# Patient Record
Sex: Male | Born: 1935
Health system: Southern US, Community
[De-identification: ages and names within clinical notes are randomized; demographics above are authoritative.]

## PROBLEM LIST (undated history)

## (undated) DIAGNOSIS — I10 Essential (primary) hypertension: Secondary | ICD-10-CM

## (undated) DIAGNOSIS — N400 Enlarged prostate without lower urinary tract symptoms: Secondary | ICD-10-CM

## (undated) HISTORY — PX: APPENDECTOMY: SHX54

---

## 2018-07-08 DIAGNOSIS — N401 Enlarged prostate with lower urinary tract symptoms: Secondary | ICD-10-CM | POA: Diagnosis not present

## 2018-07-08 DIAGNOSIS — Z0000 Encounter for general adult medical examination without abnormal findings: Secondary | ICD-10-CM | POA: Diagnosis not present

## 2018-07-08 DIAGNOSIS — R7303 Prediabetes: Secondary | ICD-10-CM | POA: Diagnosis not present

## 2018-07-08 DIAGNOSIS — N183 Chronic kidney disease, stage 3 (moderate): Secondary | ICD-10-CM | POA: Diagnosis not present

## 2018-07-08 DIAGNOSIS — I10 Essential (primary) hypertension: Secondary | ICD-10-CM | POA: Diagnosis not present

## 2018-07-08 DIAGNOSIS — I8310 Varicose veins of unspecified lower extremity with inflammation: Secondary | ICD-10-CM | POA: Diagnosis not present

## 2018-07-08 DIAGNOSIS — R42 Dizziness and giddiness: Secondary | ICD-10-CM | POA: Diagnosis not present

## 2018-07-08 DIAGNOSIS — D692 Other nonthrombocytopenic purpura: Secondary | ICD-10-CM | POA: Diagnosis not present

## 2018-07-08 DIAGNOSIS — Z79899 Other long term (current) drug therapy: Secondary | ICD-10-CM | POA: Diagnosis not present

## 2018-07-08 DIAGNOSIS — E785 Hyperlipidemia, unspecified: Secondary | ICD-10-CM | POA: Diagnosis not present

## 2018-07-08 DIAGNOSIS — E559 Vitamin D deficiency, unspecified: Secondary | ICD-10-CM | POA: Diagnosis not present

## 2018-08-06 DIAGNOSIS — R7303 Prediabetes: Secondary | ICD-10-CM | POA: Diagnosis not present

## 2018-08-06 DIAGNOSIS — E785 Hyperlipidemia, unspecified: Secondary | ICD-10-CM | POA: Diagnosis not present

## 2018-08-06 DIAGNOSIS — Z5181 Encounter for therapeutic drug level monitoring: Secondary | ICD-10-CM | POA: Diagnosis not present

## 2019-01-20 DIAGNOSIS — I10 Essential (primary) hypertension: Secondary | ICD-10-CM | POA: Diagnosis not present

## 2019-01-20 DIAGNOSIS — R42 Dizziness and giddiness: Secondary | ICD-10-CM | POA: Diagnosis not present

## 2019-01-20 DIAGNOSIS — M1990 Unspecified osteoarthritis, unspecified site: Secondary | ICD-10-CM | POA: Diagnosis not present

## 2019-08-07 DIAGNOSIS — I10 Essential (primary) hypertension: Secondary | ICD-10-CM | POA: Diagnosis not present

## 2019-08-07 DIAGNOSIS — F330 Major depressive disorder, recurrent, mild: Secondary | ICD-10-CM | POA: Diagnosis not present

## 2019-08-07 DIAGNOSIS — Z79899 Other long term (current) drug therapy: Secondary | ICD-10-CM | POA: Diagnosis not present

## 2019-08-07 DIAGNOSIS — R7303 Prediabetes: Secondary | ICD-10-CM | POA: Diagnosis not present

## 2019-08-07 DIAGNOSIS — N1831 Chronic kidney disease, stage 3a: Secondary | ICD-10-CM | POA: Diagnosis not present

## 2019-08-07 DIAGNOSIS — R42 Dizziness and giddiness: Secondary | ICD-10-CM | POA: Diagnosis not present

## 2019-08-07 DIAGNOSIS — E785 Hyperlipidemia, unspecified: Secondary | ICD-10-CM | POA: Diagnosis not present

## 2019-08-07 DIAGNOSIS — N401 Enlarged prostate with lower urinary tract symptoms: Secondary | ICD-10-CM | POA: Diagnosis not present

## 2019-08-07 DIAGNOSIS — N183 Chronic kidney disease, stage 3 unspecified: Secondary | ICD-10-CM | POA: Diagnosis not present

## 2019-08-07 DIAGNOSIS — Z0000 Encounter for general adult medical examination without abnormal findings: Secondary | ICD-10-CM | POA: Diagnosis not present

## 2019-08-07 DIAGNOSIS — D692 Other nonthrombocytopenic purpura: Secondary | ICD-10-CM | POA: Diagnosis not present

## 2019-08-07 DIAGNOSIS — E559 Vitamin D deficiency, unspecified: Secondary | ICD-10-CM | POA: Diagnosis not present

## 2020-02-23 ENCOUNTER — Emergency Department (HOSPITAL_COMMUNITY): Payer: PPO

## 2020-02-23 ENCOUNTER — Other Ambulatory Visit: Payer: Self-pay

## 2020-02-23 ENCOUNTER — Encounter (HOSPITAL_COMMUNITY): Payer: Self-pay

## 2020-02-23 ENCOUNTER — Inpatient Hospital Stay (HOSPITAL_COMMUNITY)
Admission: EM | Admit: 2020-02-23 | Discharge: 2020-02-27 | DRG: 175 | Disposition: A | Discharge status: Home / Self Care | Payer: PPO | Attending: Internal Medicine | Admitting: Internal Medicine

## 2020-02-23 DIAGNOSIS — N136 Pyonephrosis: Secondary | ICD-10-CM | POA: Diagnosis present

## 2020-02-23 DIAGNOSIS — I2729 Other secondary pulmonary hypertension: Secondary | ICD-10-CM | POA: Diagnosis not present

## 2020-02-23 DIAGNOSIS — I82432 Acute embolism and thrombosis of left popliteal vein: Secondary | ICD-10-CM | POA: Diagnosis not present

## 2020-02-23 DIAGNOSIS — R079 Chest pain, unspecified: Secondary | ICD-10-CM | POA: Diagnosis not present

## 2020-02-23 DIAGNOSIS — K5790 Diverticulosis of intestine, part unspecified, without perforation or abscess without bleeding: Secondary | ICD-10-CM | POA: Diagnosis not present

## 2020-02-23 DIAGNOSIS — R000 Tachycardia, unspecified: Secondary | ICD-10-CM | POA: Diagnosis not present

## 2020-02-23 DIAGNOSIS — J479 Bronchiectasis, uncomplicated: Secondary | ICD-10-CM | POA: Diagnosis not present

## 2020-02-23 DIAGNOSIS — I2609 Other pulmonary embolism with acute cor pulmonale: Secondary | ICD-10-CM | POA: Diagnosis not present

## 2020-02-23 DIAGNOSIS — N1831 Chronic kidney disease, stage 3a: Secondary | ICD-10-CM | POA: Diagnosis not present

## 2020-02-23 DIAGNOSIS — I10 Essential (primary) hypertension: Secondary | ICD-10-CM | POA: Diagnosis not present

## 2020-02-23 DIAGNOSIS — I129 Hypertensive chronic kidney disease with stage 1 through stage 4 chronic kidney disease, or unspecified chronic kidney disease: Secondary | ICD-10-CM | POA: Diagnosis not present

## 2020-02-23 DIAGNOSIS — Z87891 Personal history of nicotine dependence: Secondary | ICD-10-CM | POA: Diagnosis not present

## 2020-02-23 DIAGNOSIS — Z79899 Other long term (current) drug therapy: Secondary | ICD-10-CM | POA: Diagnosis not present

## 2020-02-23 DIAGNOSIS — N390 Urinary tract infection, site not specified: Secondary | ICD-10-CM | POA: Diagnosis present

## 2020-02-23 DIAGNOSIS — N3289 Other specified disorders of bladder: Secondary | ICD-10-CM | POA: Diagnosis not present

## 2020-02-23 DIAGNOSIS — Q631 Lobulated, fused and horseshoe kidney: Secondary | ICD-10-CM

## 2020-02-23 DIAGNOSIS — R309 Painful micturition, unspecified: Secondary | ICD-10-CM | POA: Diagnosis not present

## 2020-02-23 DIAGNOSIS — J90 Pleural effusion, not elsewhere classified: Secondary | ICD-10-CM | POA: Diagnosis not present

## 2020-02-23 DIAGNOSIS — I2602 Saddle embolus of pulmonary artery with acute cor pulmonale: Secondary | ICD-10-CM | POA: Diagnosis not present

## 2020-02-23 DIAGNOSIS — N323 Diverticulum of bladder: Secondary | ICD-10-CM | POA: Diagnosis not present

## 2020-02-23 DIAGNOSIS — I712 Thoracic aortic aneurysm, without rupture, unspecified: Secondary | ICD-10-CM | POA: Diagnosis present

## 2020-02-23 DIAGNOSIS — I2699 Other pulmonary embolism without acute cor pulmonale: Secondary | ICD-10-CM | POA: Diagnosis not present

## 2020-02-23 DIAGNOSIS — N179 Acute kidney failure, unspecified: Secondary | ICD-10-CM | POA: Diagnosis present

## 2020-02-23 DIAGNOSIS — I358 Other nonrheumatic aortic valve disorders: Secondary | ICD-10-CM | POA: Diagnosis present

## 2020-02-23 DIAGNOSIS — Z20822 Contact with and (suspected) exposure to covid-19: Secondary | ICD-10-CM | POA: Diagnosis not present

## 2020-02-23 DIAGNOSIS — N132 Hydronephrosis with renal and ureteral calculous obstruction: Secondary | ICD-10-CM

## 2020-02-23 DIAGNOSIS — R0781 Pleurodynia: Secondary | ICD-10-CM | POA: Diagnosis present

## 2020-02-23 DIAGNOSIS — N289 Disorder of kidney and ureter, unspecified: Secondary | ICD-10-CM

## 2020-02-23 DIAGNOSIS — N400 Enlarged prostate without lower urinary tract symptoms: Secondary | ICD-10-CM | POA: Diagnosis not present

## 2020-02-23 DIAGNOSIS — R0789 Other chest pain: Secondary | ICD-10-CM | POA: Diagnosis not present

## 2020-02-23 DIAGNOSIS — T83511A Infection and inflammatory reaction due to indwelling urethral catheter, initial encounter: Secondary | ICD-10-CM | POA: Diagnosis present

## 2020-02-23 DIAGNOSIS — M7989 Other specified soft tissue disorders: Secondary | ICD-10-CM | POA: Diagnosis not present

## 2020-02-23 DIAGNOSIS — N1330 Unspecified hydronephrosis: Secondary | ICD-10-CM | POA: Diagnosis not present

## 2020-02-23 DIAGNOSIS — N135 Crossing vessel and stricture of ureter without hydronephrosis: Secondary | ICD-10-CM | POA: Diagnosis present

## 2020-02-23 DIAGNOSIS — I071 Rheumatic tricuspid insufficiency: Secondary | ICD-10-CM | POA: Diagnosis present

## 2020-02-23 DIAGNOSIS — Z66 Do not resuscitate: Secondary | ICD-10-CM | POA: Diagnosis not present

## 2020-02-23 DIAGNOSIS — R319 Hematuria, unspecified: Secondary | ICD-10-CM | POA: Diagnosis not present

## 2020-02-23 DIAGNOSIS — R0902 Hypoxemia: Secondary | ICD-10-CM | POA: Diagnosis not present

## 2020-02-23 HISTORY — DX: Benign prostatic hyperplasia without lower urinary tract symptoms: N40.0

## 2020-02-23 HISTORY — DX: Essential (primary) hypertension: I10

## 2020-02-23 LAB — CBC WITH DIFFERENTIAL/PLATELET
Abs Immature Granulocytes: 0.03 10*3/uL (ref 0.00–0.07)
Basophils Absolute: 0 10*3/uL (ref 0.0–0.1)
Basophils Relative: 0 %
Eosinophils Absolute: 0.2 10*3/uL (ref 0.0–0.5)
Eosinophils Relative: 2 %
HCT: 40.1 % (ref 39.0–52.0)
Hemoglobin: 12.1 g/dL — ABNORMAL LOW (ref 13.0–17.0)
Immature Granulocytes: 0 %
Lymphocytes Relative: 11 %
Lymphs Abs: 1.1 10*3/uL (ref 0.7–4.0)
MCH: 27.1 pg (ref 26.0–34.0)
MCHC: 30.2 g/dL (ref 30.0–36.0)
MCV: 89.7 fL (ref 80.0–100.0)
Monocytes Absolute: 0.8 10*3/uL (ref 0.1–1.0)
Monocytes Relative: 8 %
Neutro Abs: 7.8 10*3/uL — ABNORMAL HIGH (ref 1.7–7.7)
Neutrophils Relative %: 79 %
Platelets: 166 10*3/uL (ref 150–400)
RBC: 4.47 MIL/uL (ref 4.22–5.81)
RDW: 14.6 % (ref 11.5–15.5)
WBC: 9.9 10*3/uL (ref 4.0–10.5)
nRBC: 0 % (ref 0.0–0.2)

## 2020-02-23 LAB — HEPATIC FUNCTION PANEL
ALT: 11 U/L (ref 0–44)
AST: 15 U/L (ref 15–41)
Albumin: 2.9 g/dL — ABNORMAL LOW (ref 3.5–5.0)
Alkaline Phosphatase: 53 U/L (ref 38–126)
Bilirubin, Direct: 0.1 mg/dL (ref 0.0–0.2)
Indirect Bilirubin: 0.7 mg/dL (ref 0.3–0.9)
Total Bilirubin: 0.8 mg/dL (ref 0.3–1.2)
Total Protein: 6.7 g/dL (ref 6.5–8.1)

## 2020-02-23 LAB — BASIC METABOLIC PANEL
Anion gap: 11 (ref 5–15)
BUN: 32 mg/dL — ABNORMAL HIGH (ref 8–23)
CO2: 23 mmol/L (ref 22–32)
Calcium: 9.3 mg/dL (ref 8.9–10.3)
Chloride: 104 mmol/L (ref 98–111)
Creatinine, Ser: 1.67 mg/dL — ABNORMAL HIGH (ref 0.61–1.24)
GFR, Estimated: 39 mL/min — ABNORMAL LOW (ref >60)
Glucose, Bld: 172 mg/dL — ABNORMAL HIGH (ref 70–99)
Potassium: 4 mmol/L (ref 3.5–5.1)
Sodium: 138 mmol/L (ref 135–145)

## 2020-02-23 LAB — TROPONIN I (HIGH SENSITIVITY): Troponin I (High Sensitivity): 43 ng/L — ABNORMAL HIGH (ref <18)

## 2020-02-23 LAB — LIPASE, BLOOD: Lipase: 28 U/L (ref 11–51)

## 2020-02-23 MED ORDER — SODIUM CHLORIDE 0.9 % IV BOLUS
1000.0000 mL | Freq: Once | INTRAVENOUS | Status: AC
  Administered 2020-02-24: 1000 mL via INTRAVENOUS

## 2020-02-23 MED ORDER — FENTANYL CITRATE (PF) 100 MCG/2ML IJ SOLN
75.0000 ug | Freq: Once | INTRAMUSCULAR | Status: AC
  Administered 2020-02-24: 75 ug via INTRAVENOUS
  Filled 2020-02-23: qty 2

## 2020-02-23 NOTE — ED Triage Notes (Signed)
Pt arrived via EMS c/o left sided pain and sob that started around 8pm while walking around his home. Pt states the pain is on his left side near his rib-cage. Pt describes the pain as sharp in nature. Pt states he started to feel sob when the pain occurred.

## 2020-02-23 NOTE — ED Provider Notes (Signed)
[facility] EMERGENCY DEPARTMENT Provider Note   CSN: [id number] Arrival date & time: 02/23/20  2228     History Chief Complaint  Patient presents with  . Chest Pain  . Shortness of Breath    Corey Cummings is a 86 y.o. male with a history of hypertension and BPH who presents to the emergency department by EMS from home with a chief complaint of left flank pain.  The patient endorses three episodes of recurrent, nonradiating left flank pain, onset today.  He characterizes the pain as "feeling as if he being stabbed with a knife in my ribs". Pain is currently 10/10. He states that the intensity of pain causes him to feel short of breath, but he was not feeling short of breath prior to the onset of pain or when the pain resolves.  He notes that the pain also increases whenever he moves his left hip.  No other known aggravating or alleviating factors.  He states the pain was present when he 1st awoke this morning and then resolved.  He later noticed the pain while he was sitting on his couch watching TV and most recently when he was sitting at the table tonight having dinner.  He attempted to take Tums and a stool softener earlier in the day for his symptoms without improvement.  He also reports that his left leg has been more swollen today.  He reports a history of swelling of the left leg intermittently for years.  He also endorses dysuria.  He is fully vaccinated against COVID-19.  However, he reports that his wife passed away from complications of COVID-19 on October 19.  He denies any URI symptoms over the last 6 to 8 weeks.  His daughter reports that he has been under an increased amount of stress over the last few weeks due to his wife's death.  He denies fever, chills, cough, palpitations, back pain, nausea, vomiting, diarrhea, hematuria, neck pain, dizziness, lightheadedness, headache, hematochezia, melena, or difficulty urinating.  Last bowel movement was this  morning.  No history of A. fib.  No family or personal history of VTE.  No recent long travel.  No history of kidney stones.  No recent falls or trauma.  Patient is DNR/DNI after discussing code status with him and his daughter at bedside.   The history is provided by medical records and the patient. No language interpreter was used.       Past Medical History:  Diagnosis Date  . BPH (benign prostatic hyperplasia)   . Hypertension     Patient Active Problem List   Diagnosis Date Noted  . Acute pulmonary embolism (HCC) 02/24/2020  . Ureteral obstruction, right 02/24/2020  . UTI (urinary tract infection) 02/24/2020  . Renal insufficiency 02/24/2020  . Thoracic aortic aneurysm without rupture (HCC) 02/24/2020  . Bronchiectasis (HCC) 02/24/2020  . Hydronephrosis with urinary obstruction due to ureteral calculus     History reviewed. No pertinent surgical history.     History reviewed. No pertinent family history.  Social History   Tobacco Use  . Smoking status: Former Smoker    Types: Cigarettes  . Tobacco comment: Quit 40 years ago  Substance Use Topics  . Alcohol use: Not on file  . Drug use: Not on file    Home Medications Prior to Admission medications   Medication Sig Start Date End Date Taking? Authorizing Provider  finasteride (PROSCAR) 5 MG tablet Take 5 mg by mouth daily. 01/12/20  Yes [provider]  hydrochlorothiazide (MICROZIDE) 12.5 MG capsule Take 12.5 mg by mouth daily. 01/04/20  Yes [provider]  olmesartan (BENICAR) 20 MG tablet Take 20 mg by mouth daily. 09/09/19  Yes [provider]    Allergies    Patient has no known allergies.  Review of Systems   Review of Systems  Constitutional: Negative for appetite change, chills, diaphoresis and fever.  HENT: Negative for congestion, sinus pressure, sinus pain and sore throat.   Eyes: Negative for visual disturbance.  Respiratory: Positive for shortness of breath.  Negative for cough and wheezing.   Cardiovascular: Positive for chest pain. Negative for palpitations.  Gastrointestinal: Negative for abdominal pain, diarrhea, nausea and vomiting.  Genitourinary: Positive for dysuria and flank pain. Negative for difficulty urinating and urgency.  Musculoskeletal: Negative for arthralgias, back pain, gait problem, myalgias, neck pain and neck stiffness.  Skin: Negative for rash.  Allergic/Immunologic: Negative for immunocompromised state.  Neurological: Negative for seizures, syncope, weakness, numbness and headaches.  Psychiatric/Behavioral: Negative for confusion.    Physical Exam Updated Vital Signs BP (!) 132/94   Pulse 93   Temp 98.2 F (36.8 C) (Oral)   Resp (!) 24   Ht  (1.753 m)   Wt 74.4 kg   SpO2 95%   BMI 24.22 kg/m   Physical Exam Vitals and nursing note reviewed.  Constitutional:      General: He is not in acute distress.    Appearance: Normal appearance. He is well-developed. He is not ill-appearing, toxic-appearing or diaphoretic.  HENT:     Head: Normocephalic.     Mouth/Throat:     Mouth: Mucous membranes are moist.  Eyes:     Extraocular Movements: Extraocular movements intact.     Conjunctiva/sclera: Conjunctivae normal.  Cardiovascular:     Rate and Rhythm: Normal rate and regular rhythm.     Heart sounds: No murmur heard.   Pulmonary:     Comments: No trauma noted to the chest Wattenbarger.  No reproducible tenderness to palpation to the chest Engram.  No crepitus or step-offs.  Lungs are clear to auscultation bilaterally.  He has mild tachypnea when laying supine that resolves when sitting upright. Chest:     Chest Banes: No tenderness.  Abdominal:     General: There is no distension.     Palpations: Abdomen is soft. There is no mass.     Tenderness: There is no abdominal tenderness. There is left CVA tenderness. There is no right CVA tenderness, guarding or rebound.     Hernia: No hernia is present.     Comments:  Abdomen is soft, nontender, nondistended. No rebound or guarding.  Negative Murphy sign.  No tenderness over McBurney's point.  Musculoskeletal:        General: No swelling, tenderness or deformity.     Cervical back: Normal range of motion and neck supple. No rigidity.     Right lower leg: No edema.     Left lower leg: Edema (non-pitting) present.  Skin:    General: Skin is warm and dry.     Capillary Refill: Capillary refill takes less than 2 seconds.     Coloration: Skin is not jaundiced.     Findings: No bruising, lesion or rash.  Neurological:     General: No focal deficit present.     Mental Status: He is alert.  Psychiatric:        Behavior: Behavior normal.     ED Results / Procedures / Treatments  Labs (all labs ordered are listed, but only abnormal results are displayed) Labs Reviewed  CBC WITH DIFFERENTIAL/PLATELET - Abnormal; Notable for the following components:      Result Value   Hemoglobin 12.1 (*)    Neutro Abs 7.8 (*)    All other components within normal limits  BASIC METABOLIC PANEL - Abnormal; Notable for the following components:   Glucose, Bld 172 (*)    BUN 32 (*)    Creatinine, Ser 1.67 (*)    GFR, Estimated 39 (*)    All other components within normal limits  HEPATIC FUNCTION PANEL - Abnormal; Notable for the following components:   Albumin 2.9 (*)    All other components within normal limits  URINALYSIS, ROUTINE W REFLEX MICROSCOPIC - Abnormal; Notable for the following components:   Color, Urine AMBER (*)    APPearance CLOUDY (*)    Hgb urine dipstick MODERATE (*)    Protein, ur 30 (*)    Leukocytes,Ua LARGE (*)    WBC, UA >50 (*)    Bacteria, UA FEW (*)    All other components within normal limits  D-DIMER, QUANTITATIVE (NOT AT [facility]) - Abnormal; Notable for the following components:   D-Dimer, Quant 8.15 (*)    All other components within normal limits  BUN - Abnormal; Notable for the following components:   BUN 28 (*)    All other  components within normal limits  TROPONIN I (HIGH SENSITIVITY) - Abnormal; Notable for the following components:   Troponin I (High Sensitivity) 43 (*)    All other components within normal limits  TROPONIN I (HIGH SENSITIVITY) - Abnormal; Notable for the following components:   Troponin I (High Sensitivity) 45 (*)    All other components within normal limits  RESPIRATORY PANEL BY RT PCR (FLU A&B, COVID)  URINE CULTURE  LIPASE, BLOOD  HEPARIN LEVEL (UNFRACTIONATED)  SODIUM, URINE, RANDOM  CREATININE, URINE, RANDOM  BASIC METABOLIC PANEL  BRAIN NATRIURETIC PEPTIDE  C-REACTIVE PROTEIN    EKG EKG Interpretation  Date/Time:  Tuesday February 23 2020 23:46:18 EST Ventricular Rate:  103 PR Interval:    QRS Duration: 118 QT Interval:  352 QTC Calculation: 461 R Axis:   54 Text Interpretation: Sinus tachycardia Multiform ventricular premature complexes Nonspecific intraventricular conduction delay Anterior infarct, old Minimal ST depression, lateral leads Confirmed by Zadie Rhine ([id number]) on 02/23/2020 11:50:13 PM   Radiology CT Angio Chest PE W and/or Wo Contrast  Addendum Date: 02/24/2020   ADDENDUM REPORT: 02/24/2020 03:35 ADDENDUM: These results were called by telephone at the time of interpretation on 02/24/2020 at 3:35 am to provider Marly Schuld [facility] , who verbally acknowledged these results. Electronically Signed   By: Helyn Numbers MD   On: 02/24/2020 03:35   Result Date: 02/24/2020 CLINICAL DATA:  Left chest pain, dyspnea EXAM: CT ANGIOGRAPHY CHEST WITH CONTRAST TECHNIQUE: Multidetector CT imaging of the chest was performed using the standard protocol during bolus administration of intravenous contrast. Multiplanar CT image reconstructions and MIPs were obtained to evaluate the vascular anatomy. CONTRAST:  60mL OMNIPAQUE IOHEXOL 350 MG/ML SOLN COMPARISON:  None. FINDINGS: Cardiovascular: There is excellent opacification of the pulmonary arterial tree. There are multiple  branching intraluminal filling defects identified within the segmental pulmonary arteries of the left upper lobe, lingula, left lower lobe, and right lower lobe compatible with acute pulmonary embolism. The embolic burden is moderate. The central pulmonary arteries are of normal caliber. There is relative enlargement of the right ventricle in relation to the  left compatible with CT evidence of right heart strain. There is reflux of contrast into the hepatic venous system in keeping with at least some degree of right heart failure. Mild coronary artery calcification. Global cardiac size within normal limits. No pericardial effusion. The thoracic aorta is dilated in its descending segment at the aortic arch measuring 3.3 cm in greatest dimension. This vessel measures 3.1 cm in dimension at the level of the left atrium. Mild atherosclerotic calcification within the thoracic aorta. Mediastinum/Nodes: There is asymmetric enlargement of the left thyroid lobe with minimal mass effect upon the airway. No underlying nodularity. No pathologic thoracic adenopathy. The esophagus is unremarkable. Lungs/Pleura: There is widespread bronchial Schwartz thickening in keeping with airway inflammation. There is mild associated bronchiectasis within the right upper lobe with peribronchial nodularity and peribronchial thickening. Together, the findings are suspicious for changes of atypical infection including mycobacterium avium intracellulare or fungal infection. Milder changes are noted within the right lower lobe. There is more focal consolidation within the posterior basal left lower lobe and basilar lingula which may reflect hemorrhagic infiltrate in the setting of a pulmonary infarct. Tiny left pleural effusion is present. No pneumothorax. No central obstructing lesion. Upper Abdomen: Cystic lesions are incompletely evaluated at the visualized upper pole of the kidneys bilaterally. No acute abnormality within the abdomen.  Musculoskeletal: No acute bone abnormality. Review of the MIP images confirms the above findings. IMPRESSION: Acute, moderate pulmonary embolism. Enlargement of the right ventricle with inversion of the normal RV/LV ratio in keeping with CT evidence of right heart strain. Reflux of contrast into the hepatic venous system compatible with at least some degree of right heart failure. Bronchial Spagnolo thickening, peribronchial nodularity, bronchiectasis predominantly within the right upper lobe in keeping with changes of atypical infection as can be seen with mycobacterium avium intracellulare. Milder changes noted within the right lower lobe. Diffuse bronchial Popescu thickening in keeping with airway inflammation. Subtle focal infiltrate within the posterior basal left lower lobe and basilar lingula possibly representing developing pulmonary infarct. Trace associated left pleural effusion. Dilation of the thoracic aortic arch measuring 3.3 cm in maximal dimension. Recommend annual imaging followup by CTA or MRA. This recommendation follows 2010 ACCF/AHA/AATS/ACR/ASA/SCA/SCAI/SIR/STS/SVM Guidelines for the Diagnosis and Management of Patients with Thoracic Aortic Disease. Circulation.2010; 121: Z610-R604. Aortic aneurysm NOS (ICD10-I71.9) Attempts are being made at this time to contact the managing physician for direct communication. Aortic Atherosclerosis (ICD10-I70.0). Electronically Signed: By: Helyn Numbers MD On: 02/24/2020 03:08   DG Chest Portable 1 View  Result Date: 02/23/2020 CLINICAL DATA:  Chest pain EXAM: PORTABLE CHEST 1 VIEW COMPARISON:  None. FINDINGS: The heart size and mediastinal contours are within normal limits. Aortic knob calcifications are seen. Both lungs are clear. The visualized skeletal structures are unremarkable. IMPRESSION: No active disease. Electronically Signed   By: Jonna Clark M.D.   On: 02/23/2020 22:54   CT Renal Stone Study  Result Date: 02/24/2020 CLINICAL DATA:  Left rib,  flank pain EXAM: CT ABDOMEN AND PELVIS WITHOUT CONTRAST TECHNIQUE: Multidetector CT imaging of the abdomen and pelvis was performed following the standard protocol without IV contrast. COMPARISON:  None. FINDINGS: Lower chest: Small left pleural effusion with some adjacent ground-glass, favoring mixed passive and dependent atelectasis. Additional scarring and architectural distortion seen the lingula. Cardiac size is top. No pericardial effusion. Aortic leaflet calcifications. Coronary artery atherosclerosis is noted as well. Hepatobiliary: No discernible focal liver lesion within the limitations of this unenhanced CT. Smooth liver surface contour. Normal liver attenuation.  Gallbladder partially decompressed. No pericholecystic fluid or inflammation. No visible calcified gallstone or biliary ductal dilatation. Pancreas: No pancreatic ductal dilatation or surrounding inflammatory changes. Spleen: Normal in size. No concerning splenic lesions. Adrenals/Urinary Tract: Normal adrenal glands. Horseshoe morphology of the kidneys with a thin parenchymal band extending posterior to the IMA. Extreme hydroureteronephrosis of the right renal moiety to the level of a large calculus positioned at the right ureterovesicular junction measuring up to 15 mm in diameter and over 33 mm in length partially protruding through the ureterovesicular orifice. Some associated stranding is noted along the right ureter as well. There is significant cortical thinning on the right as well with numerous cystic appearing foci, some of which could reflect dilated calices though are incompletely assessed on this unenhanced CT. Several of these demonstrate some peripheral thin calcifications which could reflect additional nonobstructing calculi or mural calcifications of cysts. More mild-to-moderate cortical thinning is seen in the left kidney with several fluid attenuation cortical and parapelvic renal cysts. A lobular, distended appearance of the  left renal pelvis is seen with abrupt transition to a more normal caliber distal ureter at the level of the ureteropelvic junction, possibly reflecting a chronic UPJ obstruction though comparison imaging is unavailable. No left urinary tract calculi are seen. Circumferential thickening of the bladder with multiple diverticular outpouchings and indentation of the bladder base by the enlarged prostate. Stomach/Bowel: Distal esophagus, stomach and duodenal sweep are unremarkable. No small bowel Acuna thickening or dilatation. No evidence of obstruction. Appendix is not visualized. No focal inflammation the vicinity of the cecum to suggest an occult appendicitis. No colonic dilatation or Stanco thickening. Vascular/Lymphatic: Atherosclerotic calcifications within the abdominal aorta and branch vessels. No aneurysm or ectasia. No enlarged abdominopelvic lymph nodes. Reproductive: Prostatomegaly with indentation of bladder base as above. Coarse eccentric calcification of the prostate. No other significant or concerning abnormalities of the prostate or seminal vesicles. Other: No abdominopelvic free fluid or free gas. No bowel containing hernias. Fat containing left inguinal hernia. Musculoskeletal: Likely remote anterior compression deformity at L1 with up to 40% height loss. No other vertebral body height loss or compression deformity is seen. No acute fracture or traumatic osseous abnormality. Multilevel degenerative changes are present in the spine, hips and pelvis. No suspicious lytic or blastic lesions. IMPRESSION: 1. Horseshoe morphology of the kidneys with severe hydroureteronephrosis of the right renal moiety to the level of a large calculus positioned at the right ureterovesicular junction measuring up to 15 mm in diameter and over 33 mm in length partially protruding through the ureterovesicular junction. 2. There is extreme cortical thinning on the right as well with numerous cystic appearing foci, some of which  may reflect dilated calices though are incompletely assessed on this unenhanced CT. Several of these demonstrate some peripheral thin calcifications which could reflect additional nonobstructing calculi or mural calcifications of cysts. Could consider further characterization with renal MRI. 3. More mild to moderate cortical thinning of the left kidney with a lobular, distended appearance of the left renal pelvis with abrupt transition to a more normal caliber ureter at the level of the ureteropelvic junction, possibly reflecting a chronic UPJ obstruction though comparison imaging is unavailable. 4. Prostatomegaly with indentation of the bladder base by the enlarged prostate. Circumferential thickening of the bladder with multiple diverticular outpouchings and indentation of the bladder base by the enlarged prostate. While findings could reflect sequela of chronic outlet obstruction given some perivesicular hazy stranding as well as nonspecific stranding along the distended right ureter,  should assess with urinalysis to exclude a superimposed ascending tract infection. 5. Small left pleural effusion with some adjacent ground-glass, favoring mixed passive and dependent atelectasis. 6. Aortic Atherosclerosis (ICD10-I70.0). Electronically Signed   By: Kreg ShropshirePrice  DeHay M.D.   On: 02/24/2020 03:11    Procedures .Critical Care Performed by: Barkley BoardsMcDonald, Keysi Oelkers A, PA-C Authorized by: Barkley BoardsMcDonald, Lakeshia Dohner A, PA-C   Critical care provider statement:    Critical care time (minutes):  55   Critical care time was exclusive of:  Separately billable procedures and treating other patients and teaching time   Critical care was necessary to treat or prevent imminent or life-threatening deterioration of the following conditions:  Cardiac failure and circulatory failure   Critical care was time spent personally by me on the following activities:  Ordering and performing treatments and interventions, ordering and review of laboratory  studies, ordering and review of radiographic studies, pulse oximetry, re-evaluation of patient's condition, review of old charts, obtaining history from patient or surrogate, examination of patient, evaluation of patient's response to treatment, discussions with consultants and development of treatment plan with patient or surrogate   I assumed direction of critical care for this patient from another provider in my specialty: no     (including critical care time)  Medications Ordered in ED Medications  heparin ADULT infusion 100 units/mL (25000 units/2150mL sodium chloride 0.45%) (1,150 Units/hr Intravenous New Bag/Given 02/24/20 0411)  sodium chloride flush (NS) 0.9 % injection 3 mL (has no administration in time range)  0.9 %  sodium chloride infusion (has no administration in time range)  acetaminophen (TYLENOL) tablet 650 mg (has no administration in time range)    Or  acetaminophen (TYLENOL) suppository 650 mg (has no administration in time range)  HYDROcodone-acetaminophen (NORCO/VICODIN) 5-325 MG per tablet 1 tablet (has no administration in time range)  fentaNYL (SUBLIMAZE) injection 50 mcg (has no administration in time range)  polyethylene glycol (MIRALAX / GLYCOLAX) packet 17 g (has no administration in time range)  bisacodyl (DULCOLAX) EC tablet 5 mg (has no administration in time range)  ondansetron (ZOFRAN) tablet 4 mg (has no administration in time range)    Or  ondansetron (ZOFRAN) injection 4 mg (has no administration in time range)  cefTRIAXone (ROCEPHIN) 1 g in sodium chloride 0.9 % 100 mL IVPB (has no administration in time range)  finasteride (PROSCAR) tablet 5 mg (has no administration in time range)  fentaNYL (SUBLIMAZE) injection 75 mcg (75 mcg Intravenous Given 02/24/20 0005)  sodium chloride 0.9 % bolus 1,000 mL (0 mLs Intravenous Stopped 02/24/20 0028)  iohexol (OMNIPAQUE) 350 MG/ML injection 80 mL (80 mLs Intravenous Contrast Given 02/24/20 0203)  fentaNYL  (SUBLIMAZE) injection 75 mcg (75 mcg Intravenous Given 02/24/20 0331)  heparin bolus via infusion 4,000 Units (4,000 Units Intravenous Bolus from Bag 02/24/20 0411)  cefTRIAXone (ROCEPHIN) 1 g in sodium chloride 0.9 % 100 mL IVPB (0 g Intravenous Stopped 02/24/20 0505)    ED Course  I have reviewed the triage vital signs and the nursing notes.  Pertinent labs & imaging results that were available during my care of the patient were reviewed by me and considered in my medical decision making (see chart for details).    MDM Rules/Calculators/A&P                          86 year old male with history of hypertension and BPH who presents the emergency department by EMS with intermittent left flank pain and pain in  his left ribs accompanied by dysuria.  Intensity of the pain causes him to feel short of breath, but otherwise he has not had any dyspnea.  He also notes that his left leg became swollen today.  He is fully vaccinated against COVID-19.  This involves an extensive number of treatment options, and is a complaint that carries with it a high risk of complications and morbidity.  The differential diagnosis includes PE, pulmonary infarct, pyelonephritis, obstructive urolithiasis, musculoskeletal etiology, ACS, gastritis, pancreatitis, tension pneumothorax, or aortic dissection.   Patient seen and independently evaluated by Dr. Bebe Shaggy, attending physician.  Vitals and Exam:    He is hypertensive, mildly tachypneic.  There is mild tachycardia.  No hypoxia.  Afebrile.  He has nonpitting edema of the left lower extremity.  Lungs are clear to auscultation bilaterally.  No trauma noted to the chest Lamp or left flank.  He has left CVA tenderness.   Lab Tests:    I ordered, reviewed, and interpreted labs, which included  urinalysis, D-dimer, CBC, CMP, troponin, BNP that showed   Creatinine of 1.67 and BUN of 32.  BUN to creatinine ratio 19.  No previous creatinine available for comparison,  but limited access to records in the patient's chart indicates that the patient has a history of CKD stage III.  He is a poor historian and cannot recall if he has ever been diagnosed with CKD.  Elevated troponin with flat trend.  This could be secondary to creatinine level.  There is also concerned that since he is having pleuritic chest pain that he could have a PE.  EKG with sinus tachycardia and minimal ST segment depression.  D-dimer is elevated and he will need a CT scan.  UA with leukocyte esterase, RBCs, and hemoglobinuria.  Urine culture sent.   Imaging Studies ordered:    I ordered imaging studies which included  CT stone study and PE study  I independently visualized and interpreted imaging which showed PE with an associated pulmonary infarct and evidence of right heart strain.  Stone study with horseshoe morphology of the kidneys with severe hydroureter nephrosis on the right with a 15 mm in diameter and 33 mm in length obstructed calculus protruding through the ureter vesicular junction.  He also appears to have extreme cortical thinning on the right and numerous cystic-appearing foci that may benefit from further evaluation with renal MRI.  There is a possible chronic UPJ obstruction on the left does not fully evaluated on CT stone study.  There is prostatomegaly with indention of the bladder base by the enlarged prostate.  There is also circumferential thickening of the bladder with diverticular outpouchings indentation of the bladder base by the enlarged prostate that could be sequelae of chronic outlet obstruction versus superimposed a sending tract infection.   Additional history obtained:    Previous records obtained and reviewed  Medicines ordered:    I ordered Rocephin for UTI, Heparin for PE with evidence of acute cor pulmonale, and fentanyl for pain control.   Consultations Obtained:    Spoke with Dr. Ramiro Harvest regarding critical findings on CT PE  study  Spoke with Dr. Elvera Maria, radiology regarding CT stone study given there was concern about partial protrusion through the ureteral vesicular junction given that patient will need to be started on heparin for PE.  He does not see evidence of active bleeding in the abdomen or pelvis or around the ureter.  Spoke with Dr. Antionette Char, hospitalist, who will accept the patient for admission  I consulted urology on behalf of Dr. Antionette Char, hospitalist. Spoke with Dr. Patience Musca, urology fellow regarding concern for obstructive urolithiasis with urinalysis concerning for infection.  We also discussed horseshoe morphology of the kidneys with severe hydroureter nephrosis of the right kidney and 15 mm stone partially protruding through the ureter vesicular junction.  Patient has been started on Rocephin and urine culture sent.  No other recommendations from urology at this time.  Urology will follow the patient as a consult.   Reevaluation:   After the interventions stated above, I reevaluated the patient and found improved  Plan and Disposition:   Patient will be admitted to the hospitalist service for further work-up and evaluation   Critical Interventions:     Heparin initiated for PE with evidence of acute cor pulmonale   Final Clinical Impression(s) / ED Diagnoses Final diagnoses:  Acute pulmonary embolism with acute cor pulmonale, unspecified pulmonary embolism type (HCC)  Pulmonary infarct (HCC)  Hydronephrosis with urinary obstruction due to ureteral calculus    Rx / DC Orders ED Discharge Orders    None       Barkley Boards, PA-C 02/24/20 1610    Zadie Rhine, MD 02/24/20 [phone]

## 2020-02-24 ENCOUNTER — Emergency Department (HOSPITAL_COMMUNITY): Payer: PPO

## 2020-02-24 ENCOUNTER — Encounter (HOSPITAL_COMMUNITY): Payer: Self-pay | Admitting: Family Medicine

## 2020-02-24 ENCOUNTER — Observation Stay (HOSPITAL_COMMUNITY): Payer: PPO

## 2020-02-24 DIAGNOSIS — R309 Painful micturition, unspecified: Secondary | ICD-10-CM | POA: Diagnosis not present

## 2020-02-24 DIAGNOSIS — I712 Thoracic aortic aneurysm, without rupture, unspecified: Secondary | ICD-10-CM | POA: Diagnosis present

## 2020-02-24 DIAGNOSIS — I2699 Other pulmonary embolism without acute cor pulmonale: Secondary | ICD-10-CM | POA: Diagnosis not present

## 2020-02-24 DIAGNOSIS — J479 Bronchiectasis, uncomplicated: Secondary | ICD-10-CM

## 2020-02-24 DIAGNOSIS — N136 Pyonephrosis: Secondary | ICD-10-CM | POA: Diagnosis present

## 2020-02-24 DIAGNOSIS — R319 Hematuria, unspecified: Secondary | ICD-10-CM

## 2020-02-24 DIAGNOSIS — Z79899 Other long term (current) drug therapy: Secondary | ICD-10-CM | POA: Diagnosis not present

## 2020-02-24 DIAGNOSIS — I2602 Saddle embolus of pulmonary artery with acute cor pulmonale: Secondary | ICD-10-CM | POA: Diagnosis not present

## 2020-02-24 DIAGNOSIS — N1831 Chronic kidney disease, stage 3a: Secondary | ICD-10-CM | POA: Diagnosis present

## 2020-02-24 DIAGNOSIS — N390 Urinary tract infection, site not specified: Secondary | ICD-10-CM

## 2020-02-24 DIAGNOSIS — M7989 Other specified soft tissue disorders: Secondary | ICD-10-CM

## 2020-02-24 DIAGNOSIS — Z87891 Personal history of nicotine dependence: Secondary | ICD-10-CM | POA: Diagnosis not present

## 2020-02-24 DIAGNOSIS — Z20822 Contact with and (suspected) exposure to covid-19: Secondary | ICD-10-CM | POA: Diagnosis present

## 2020-02-24 DIAGNOSIS — N400 Enlarged prostate without lower urinary tract symptoms: Secondary | ICD-10-CM | POA: Diagnosis present

## 2020-02-24 DIAGNOSIS — N135 Crossing vessel and stricture of ureter without hydronephrosis: Secondary | ICD-10-CM | POA: Diagnosis not present

## 2020-02-24 DIAGNOSIS — N289 Disorder of kidney and ureter, unspecified: Secondary | ICD-10-CM

## 2020-02-24 DIAGNOSIS — I82432 Acute embolism and thrombosis of left popliteal vein: Secondary | ICD-10-CM | POA: Diagnosis present

## 2020-02-24 DIAGNOSIS — R0781 Pleurodynia: Secondary | ICD-10-CM | POA: Diagnosis present

## 2020-02-24 DIAGNOSIS — I2609 Other pulmonary embolism with acute cor pulmonale: Secondary | ICD-10-CM | POA: Diagnosis present

## 2020-02-24 DIAGNOSIS — N179 Acute kidney failure, unspecified: Secondary | ICD-10-CM | POA: Diagnosis present

## 2020-02-24 DIAGNOSIS — I129 Hypertensive chronic kidney disease with stage 1 through stage 4 chronic kidney disease, or unspecified chronic kidney disease: Secondary | ICD-10-CM | POA: Diagnosis present

## 2020-02-24 DIAGNOSIS — N132 Hydronephrosis with renal and ureteral calculous obstruction: Secondary | ICD-10-CM

## 2020-02-24 DIAGNOSIS — T83511A Infection and inflammatory reaction due to indwelling urethral catheter, initial encounter: Secondary | ICD-10-CM | POA: Diagnosis present

## 2020-02-24 DIAGNOSIS — Z66 Do not resuscitate: Secondary | ICD-10-CM | POA: Diagnosis present

## 2020-02-24 DIAGNOSIS — I2729 Other secondary pulmonary hypertension: Secondary | ICD-10-CM | POA: Diagnosis present

## 2020-02-24 DIAGNOSIS — Q631 Lobulated, fused and horseshoe kidney: Secondary | ICD-10-CM | POA: Diagnosis not present

## 2020-02-24 LAB — URINALYSIS, ROUTINE W REFLEX MICROSCOPIC
Bilirubin Urine: NEGATIVE
Glucose, UA: NEGATIVE mg/dL
Ketones, ur: NEGATIVE mg/dL
Nitrite: NEGATIVE
Protein, ur: 30 mg/dL — AB
Specific Gravity, Urine: 1.013 (ref 1.005–1.030)
WBC, UA: >50 WBC/hpf — ABNORMAL HIGH (ref 0–5)
pH: 6 (ref 5.0–8.0)

## 2020-02-24 LAB — ECHOCARDIOGRAM COMPLETE
Area-P 1/2: 4.09 cm2
Calc EF: 53.1 %
Height: 69 in
S' Lateral: 3.5 cm
Single Plane A2C EF: 54.5 %
Single Plane A4C EF: 54.7 %
Weight: 2624 oz

## 2020-02-24 LAB — BASIC METABOLIC PANEL
Anion gap: 9 (ref 5–15)
BUN: 26 mg/dL — ABNORMAL HIGH (ref 8–23)
CO2: 22 mmol/L (ref 22–32)
Calcium: 9 mg/dL (ref 8.9–10.3)
Chloride: 108 mmol/L (ref 98–111)
Creatinine, Ser: 1.36 mg/dL — ABNORMAL HIGH (ref 0.61–1.24)
GFR, Estimated: 50 mL/min — ABNORMAL LOW (ref >60)
Glucose, Bld: 110 mg/dL — ABNORMAL HIGH (ref 70–99)
Potassium: 4 mmol/L (ref 3.5–5.1)
Sodium: 139 mmol/L (ref 135–145)

## 2020-02-24 LAB — RESPIRATORY PANEL BY RT PCR (FLU A&B, COVID)
Influenza A by PCR: NEGATIVE
Influenza B by PCR: NEGATIVE
SARS Coronavirus 2 by RT PCR: NEGATIVE

## 2020-02-24 LAB — D-DIMER, QUANTITATIVE: D-Dimer, Quant: 8.15 ug/mL-FEU — ABNORMAL HIGH (ref 0.00–0.50)

## 2020-02-24 LAB — HEPARIN LEVEL (UNFRACTIONATED)
Heparin Unfractionated: 0.27 IU/mL — ABNORMAL LOW (ref 0.30–0.70)
Heparin Unfractionated: 0.45 IU/mL (ref 0.30–0.70)

## 2020-02-24 LAB — TROPONIN I (HIGH SENSITIVITY): Troponin I (High Sensitivity): 45 ng/L — ABNORMAL HIGH (ref <18)

## 2020-02-24 LAB — BUN: BUN: 28 mg/dL — ABNORMAL HIGH (ref 8–23)

## 2020-02-24 LAB — BRAIN NATRIURETIC PEPTIDE: B Natriuretic Peptide: 285.5 pg/mL — ABNORMAL HIGH (ref 0.0–100.0)

## 2020-02-24 LAB — C-REACTIVE PROTEIN: CRP: 11 mg/dL — ABNORMAL HIGH (ref <1.0)

## 2020-02-24 MED ORDER — HYDROCODONE-ACETAMINOPHEN 5-325 MG PO TABS: 1.0000 | ORAL_TABLET | ORAL | Status: DC | PRN

## 2020-02-24 MED ORDER — ACETAMINOPHEN 325 MG PO TABS
650.0000 mg | ORAL_TABLET | Freq: Four times a day (QID) | ORAL | Status: DC | PRN
  Administered 2020-02-26 – 2020-02-27 (×2): 650 mg via ORAL
  Filled 2020-02-24 (×2): qty 2

## 2020-02-24 MED ORDER — FINASTERIDE 5 MG PO TABS
5.0000 mg | ORAL_TABLET | Freq: Every day | ORAL | Status: DC
  Administered 2020-02-24 – 2020-02-27 (×4): 5 mg via ORAL
  Filled 2020-02-24 (×4): qty 1

## 2020-02-24 MED ORDER — ONDANSETRON HCL 4 MG PO TABS: 4.0000 mg | ORAL_TABLET | Freq: Four times a day (QID) | ORAL | Status: DC | PRN

## 2020-02-24 MED ORDER — HEPARIN BOLUS VIA INFUSION
4000.0000 [IU] | Freq: Once | INTRAVENOUS | Status: AC
  Administered 2020-02-24: 4000 [IU] via INTRAVENOUS
  Filled 2020-02-24: qty 4000

## 2020-02-24 MED ORDER — FENTANYL CITRATE (PF) 100 MCG/2ML IJ SOLN: 50.0000 ug | INTRAMUSCULAR | Status: DC | PRN

## 2020-02-24 MED ORDER — POLYETHYLENE GLYCOL 3350 17 G PO PACK: 17.0000 g | PACK | Freq: Every day | ORAL | Status: DC | PRN

## 2020-02-24 MED ORDER — SODIUM CHLORIDE 0.9 % IV SOLN
1.0000 g | Freq: Once | INTRAVENOUS | Status: AC
  Administered 2020-02-24: 1 g via INTRAVENOUS
  Filled 2020-02-24: qty 10

## 2020-02-24 MED ORDER — SODIUM CHLORIDE 0.9% FLUSH
3.0000 mL | Freq: Two times a day (BID) | INTRAVENOUS | Status: DC
  Administered 2020-02-24 – 2020-02-26 (×5): 3 mL via INTRAVENOUS

## 2020-02-24 MED ORDER — ACETAMINOPHEN 650 MG RE SUPP: 650.0000 mg | Freq: Four times a day (QID) | RECTAL | Status: DC | PRN

## 2020-02-24 MED ORDER — ONDANSETRON HCL 4 MG/2ML IJ SOLN: 4.0000 mg | Freq: Four times a day (QID) | INTRAMUSCULAR | Status: DC | PRN

## 2020-02-24 MED ORDER — BISACODYL 5 MG PO TBEC: 5.0000 mg | DELAYED_RELEASE_TABLET | Freq: Every day | ORAL | Status: DC | PRN

## 2020-02-24 MED ORDER — SODIUM CHLORIDE 0.9 % IV SOLN
1.0000 g | INTRAVENOUS | Status: DC
  Administered 2020-02-25 – 2020-02-26 (×2): 1 g via INTRAVENOUS
  Filled 2020-02-24 (×2): qty 10

## 2020-02-24 MED ORDER — HYDROCHLOROTHIAZIDE 12.5 MG PO CAPS
12.5000 mg | ORAL_CAPSULE | Freq: Every day | ORAL | Status: DC
  Administered 2020-02-24: 12.5 mg via ORAL
  Filled 2020-02-24: qty 1

## 2020-02-24 MED ORDER — HEPARIN (PORCINE) 25000 UT/250ML-% IV SOLN
1250.0000 [IU]/h | INTRAVENOUS | Status: DC
  Administered 2020-02-24: 1250 [IU]/h via INTRAVENOUS
  Administered 2020-02-24: 1150 [IU]/h via INTRAVENOUS
  Filled 2020-02-24 (×2): qty 250

## 2020-02-24 MED ORDER — FENTANYL CITRATE (PF) 100 MCG/2ML IJ SOLN
75.0000 ug | Freq: Once | INTRAMUSCULAR | Status: AC
  Administered 2020-02-24: 75 ug via INTRAVENOUS
  Filled 2020-02-24: qty 2

## 2020-02-24 MED ORDER — SODIUM CHLORIDE 0.9 % IV SOLN: INTRAVENOUS | Status: AC

## 2020-02-24 MED ORDER — IOHEXOL 350 MG/ML SOLN
80.0000 mL | Freq: Once | INTRAVENOUS | Status: AC | PRN
  Administered 2020-02-24: 80 mL via INTRAVENOUS

## 2020-02-24 MED ORDER — HEPARIN BOLUS VIA INFUSION
1000.0000 [IU] | Freq: Once | INTRAVENOUS | Status: AC
  Administered 2020-02-24: 1000 [IU] via INTRAVENOUS
  Filled 2020-02-24: qty 1000

## 2020-02-24 NOTE — Consult Note (Signed)
Urology Consult   Physician requesting consult: Dr. Ella JubileeArrien, [place] RamMauricio Daniel  Reason for consult: obstructing ureteral stone  History of Present Illness: Corey Cummings is a 86 y.o. with history of BPH, HTN who presented to ED with a day of SOB and left-sided pleuritic pain and was found to have acute PE. Urology was consulted when he was found incidentally on CT imaging to have a large 1.5x3cm right obstructing ureteral stone at the level of the UVJ with severe hydroureteronephrosis of the right ureter and kidney with cortical thinning consistent with chronic obstruction. Large prostate on imaging. He denies any recent fevers, chills, nausea, vomiting. He denies right flank or abdominal pain. He denies hematuria, however does report intermittent burning with urination that has been stable for several months. He denies personal or family history of stones or GU malignancy and denies personal history of UTIs. He takes finasteride for BPH and reports decent urine stream and sensation of complete emptying.    Past Medical History:  Diagnosis Date  . BPH (benign prostatic hyperplasia)   . Hypertension     History reviewed. No pertinent surgical history.  Current Hospital Medications:  Home Meds:  No current facility-administered medications on file prior to encounter.   Current Outpatient Medications on File Prior to Encounter  Medication Sig Dispense Refill  . finasteride (PROSCAR) 5 MG tablet Take 5 mg by mouth daily.    . hydrochlorothiazide (MICROZIDE) 12.5 MG capsule Take 12.5 mg by mouth daily.    Marland Kitchen. olmesartan (BENICAR) 20 MG tablet Take 20 mg by mouth daily.       Scheduled Meds: . finasteride  5 mg Oral Daily  . sodium chloride flush  3 mL Intravenous Q12H   Continuous Infusions: . sodium chloride    . [START ON 02/25/2020] cefTRIAXone (ROCEPHIN)  IV    . heparin 1,150 Units/hr (02/24/20 0411)   PRN Meds:.acetaminophen **OR** acetaminophen, bisacodyl, fentaNYL (SUBLIMAZE)  injection, HYDROcodone-acetaminophen, ondansetron **OR** ondansetron (ZOFRAN) IV, polyethylene glycol  Allergies: No Known Allergies  History reviewed. No pertinent family history.  Social History:  reports that he has quit smoking. His smoking use included cigarettes. He does not have any smokeless tobacco history on file. No history on file for alcohol use and drug use.  ROS: A complete review of systems was performed.  All systems are negative except for pertinent findings as noted.  Physical Exam:  Vital signs in last 24 hours: Temp:  [98.2 F (36.8 C)] 98.2 F (36.8 C) (11/16 2231) Pulse Rate:  [83-105] 85 (11/17 0700) Resp:  [18-37] 37 (11/17 0700) BP: (117-167)/(83-104) 134/91 (11/17 0700) SpO2:  [92 %-97 %] 94 % (11/17 0700) Weight:  [74.4 kg] 74.4 kg (11/16 2231) Constitutional:  Alert and oriented, No acute distress Cardiovascular: tachycardic Respiratory: slightly increased respiratory effort GI: Abdomen is soft, nontender, nondistended, no abdominal masses GU: No CVA tenderness. Voiding spontaneously. Urine clear yellow. Lymphatic: No lymphadenopathy Neurologic: Grossly intact, no focal deficits Psychiatric: Normal mood and affect  Laboratory Data:  Recent Labs    02/23/20 2232  WBC 9.9  HGB 12.1*  HCT 40.1  PLT 166    Recent Labs    02/23/20 2232 02/24/20 0414  NA 138  --   K 4.0  --   CL 104  --   GLUCOSE 172*  --   BUN 32* 28*  CALCIUM 9.3  --   CREATININE 1.67*  --      Results for orders placed or performed during the hospital encounter of  02/23/20 (from the past 24 hour(s))  CBC with Differential     Status: Abnormal   Collection Time: 02/23/20 10:32 PM  Result Value Ref Range   WBC 9.9 4.0 - 10.5 K/uL   RBC 4.47 4.22 - 5.81 MIL/uL   Hemoglobin 12.1 (L) 13.0 - 17.0 g/dL   HCT 16.1 39 - 52 %   MCV 89.7 80.0 - 100.0 fL   MCH 27.1 26.0 - 34.0 pg   MCHC 30.2 30.0 - 36.0 g/dL   RDW 09.6 04.5 - 40.9 %   Platelets 166 150 - 400 K/uL   nRBC  0.0 0.0 - 0.2 %   Neutrophils Relative % 79 %   Neutro Abs 7.8 (H) 1.7 - 7.7 K/uL   Lymphocytes Relative 11 %   Lymphs Abs 1.1 0.7 - 4.0 K/uL   Monocytes Relative 8 %   Monocytes Absolute 0.8 0.1 - 1.0 K/uL   Eosinophils Relative 2 %   Eosinophils Absolute 0.2 0.0 - 0.5 K/uL   Basophils Relative 0 %   Basophils Absolute 0.0 0.0 - 0.1 K/uL   Immature Granulocytes 0 %   Abs Immature Granulocytes 0.03 0.00 - 0.07 K/uL  Basic metabolic panel     Status: Abnormal   Collection Time: 02/23/20 10:32 PM  Result Value Ref Range   Sodium 138 135 - 145 mmol/L   Potassium 4.0 3.5 - 5.1 mmol/L   Chloride 104 98 - 111 mmol/L   CO2 23 22 - 32 mmol/L   Glucose, Bld 172 (H) 70 - 99 mg/dL   BUN 32 (H) 8 - 23 mg/dL   Creatinine, Ser 8.11 (H) 0.61 - 1.24 mg/dL   Calcium 9.3 8.9 - 91.4 mg/dL   GFR, Estimated 39 (L) >60 mL/min   Anion gap 11 5 - 15  Hepatic function panel     Status: Abnormal   Collection Time: 02/23/20 10:32 PM  Result Value Ref Range   Total Protein 6.7 6.5 - 8.1 g/dL   Albumin 2.9 (L) 3.5 - 5.0 g/dL   AST 15 15 - 41 U/L   ALT 11 0 - 44 U/L   Alkaline Phosphatase 53 38 - 126 U/L   Total Bilirubin 0.8 0.3 - 1.2 mg/dL   Bilirubin, Direct 0.1 0.0 - 0.2 mg/dL   Indirect Bilirubin 0.7 0.3 - 0.9 mg/dL  Lipase, blood     Status: None   Collection Time: 02/23/20 10:32 PM  Result Value Ref Range   Lipase 28 11 - 51 U/L  Troponin I (High Sensitivity)     Status: Abnormal   Collection Time: 02/23/20 10:32 PM  Result Value Ref Range   Troponin I (High Sensitivity) 43 (H) <18 ng/L  D-dimer, quantitative (not at [facility])     Status: Abnormal   Collection Time: 02/23/20 11:58 PM  Result Value Ref Range   D-Dimer, Quant 8.15 (H) 0.00 - 0.50 ug/mL-FEU  Troponin I (High Sensitivity)     Status: Abnormal   Collection Time: 02/24/20 12:32 AM  Result Value Ref Range   Troponin I (High Sensitivity) 45 (H) <18 ng/L  Respiratory Panel by RT PCR (Flu A&B, Covid) - Nasopharyngeal Swab     Status:  None   Collection Time: 02/24/20 12:36 AM   Specimen: Nasopharyngeal Swab  Result Value Ref Range   SARS Coronavirus 2 by RT PCR NEGATIVE NEGATIVE   Influenza A by PCR NEGATIVE NEGATIVE   Influenza B by PCR NEGATIVE NEGATIVE  Urinalysis, Routine w reflex microscopic  Status: Abnormal   Collection Time: 02/24/20  3:28 AM  Result Value Ref Range   Color, Urine AMBER (A) YELLOW   APPearance CLOUDY (A) CLEAR   Specific Gravity, Urine 1.013 1.005 - 1.030   pH 6.0 5.0 - 8.0   Glucose, UA NEGATIVE NEGATIVE mg/dL   Hgb urine dipstick MODERATE (A) NEGATIVE   Bilirubin Urine NEGATIVE NEGATIVE   Ketones, ur NEGATIVE NEGATIVE mg/dL   Protein, ur 30 (A) NEGATIVE mg/dL   Nitrite NEGATIVE NEGATIVE   Leukocytes,Ua LARGE (A) NEGATIVE   RBC / HPF 11-20 0 - 5 RBC/hpf   WBC, UA >50 (H) 0 - 5 WBC/hpf   Bacteria, UA FEW (A) NONE SEEN  BUN     Status: Abnormal   Collection Time: 02/24/20  4:14 AM  Result Value Ref Range   BUN 28 (H) 8 - 23 mg/dL   Recent Results (from the past 240 hour(s))  Respiratory Panel by RT PCR (Flu A&B, Covid) - Nasopharyngeal Swab     Status: None   Collection Time: 02/24/20 12:36 AM   Specimen: Nasopharyngeal Swab  Result Value Ref Range Status   SARS Coronavirus 2 by RT PCR NEGATIVE NEGATIVE Final    Comment: (NOTE) SARS-CoV-2 target nucleic acids are NOT DETECTED.  The SARS-CoV-2 RNA is generally detectable in upper respiratoy specimens during the acute phase of infection. The lowest concentration of SARS-CoV-2 viral copies this assay can detect is 131 copies/mL. A negative result does not preclude SARS-Cov-2 infection and should not be used as the sole basis for treatment or other patient management decisions. A negative result may occur with  improper specimen collection/handling, submission of specimen other than nasopharyngeal swab, presence of viral mutation(s) within the areas targeted by this assay, and inadequate number of viral copies (<131 copies/mL).  A negative result must be combined with clinical observations, patient history, and epidemiological information. The expected result is Negative.  Fact Sheet for Patients:  https://www.moore.com/  Fact Sheet for Healthcare Providers:  https://www.young.biz/  This test is no t yet approved or cleared by the [place] FDA and  has been authorized for detection and/or diagnosis of SARS-CoV-2 by FDA under an Emergency Use Authorization (EUA). This EUA will remain  in effect (meaning this test can be used) for the duration of the COVID-19 declaration under Section 564(b)(1) of the Act, 21 U.S.C. section 360bbb-3(b)(1), unless the authorization is terminated or revoked sooner.     Influenza A by PCR NEGATIVE NEGATIVE Final   Influenza B by PCR NEGATIVE NEGATIVE Final    Comment: (NOTE) The Xpert Xpress SARS-CoV-2/FLU/RSV assay is intended as an aid in  the diagnosis of influenza from Nasopharyngeal swab specimens and  should not be used as a sole basis for treatment. Nasal washings and  aspirates are unacceptable for Xpert Xpress SARS-CoV-2/FLU/RSV  testing.  Fact Sheet for Patients: https://www.moore.com/  Fact Sheet for Healthcare Providers: https://www.young.biz/  This test is not yet approved or cleared by the [place] FDA and  has been authorized for detection and/or diagnosis of SARS-CoV-2 by  FDA under an Emergency Use Authorization (EUA). This EUA will remain  in effect (meaning this test can be used) for the duration of the  Covid-19 declaration under Section 564(b)(1) of the Act, 21  U.S.C. section 360bbb-3(b)(1), unless the authorization is  terminated or revoked. Performed at [facility], [address] [place], [place] [address]     Renal Function: Recent Labs    02/23/20 2232  CREATININE  1.67*   Estimated Creatinine Clearance: 31.2 mL/min (A) (by C-G formula based  on SCr of 1.67 mg/dL (H)).  Radiologic Imaging: CT Angio Chest PE W and/or Wo Contrast  Addendum Date: 02/24/2020   ADDENDUM REPORT: 02/24/2020 03:35 ADDENDUM: These results were called by telephone at the time of interpretation on 02/24/2020 at 3:35 am to provider [facility] , who verbally acknowledged these results. Electronically Signed   By: Helyn Numbers MD   On: 02/24/2020 03:35   Result Date: 02/24/2020 CLINICAL DATA:  Left chest pain, dyspnea EXAM: CT ANGIOGRAPHY CHEST WITH CONTRAST TECHNIQUE: Multidetector CT imaging of the chest was performed using the standard protocol during bolus administration of intravenous contrast. Multiplanar CT image reconstructions and MIPs were obtained to evaluate the vascular anatomy. CONTRAST:  50mL OMNIPAQUE IOHEXOL 350 MG/ML SOLN COMPARISON:  None. FINDINGS: Cardiovascular: There is excellent opacification of the pulmonary arterial tree. There are multiple branching intraluminal filling defects identified within the segmental pulmonary arteries of the left upper lobe, lingula, left lower lobe, and right lower lobe compatible with acute pulmonary embolism. The embolic burden is moderate. The central pulmonary arteries are of normal caliber. There is relative enlargement of the right ventricle in relation to the left compatible with CT evidence of right heart strain. There is reflux of contrast into the hepatic venous system in keeping with at least some degree of right heart failure. Mild coronary artery calcification. Global cardiac size within normal limits. No pericardial effusion. The thoracic aorta is dilated in its descending segment at the aortic arch measuring 3.3 cm in greatest dimension. This vessel measures 3.1 cm in dimension at the level of the left atrium. Mild atherosclerotic calcification within the thoracic aorta. Mediastinum/Nodes: There is asymmetric enlargement of the left thyroid lobe with minimal mass effect upon the airway. No underlying  nodularity. No pathologic thoracic adenopathy. The esophagus is unremarkable. Lungs/Pleura: There is widespread bronchial Stipes thickening in keeping with airway inflammation. There is mild associated bronchiectasis within the right upper lobe with peribronchial nodularity and peribronchial thickening. Together, the findings are suspicious for changes of atypical infection including mycobacterium avium intracellulare or fungal infection. Milder changes are noted within the right lower lobe. There is more focal consolidation within the posterior basal left lower lobe and basilar lingula which may reflect hemorrhagic infiltrate in the setting of a pulmonary infarct. Tiny left pleural effusion is present. No pneumothorax. No central obstructing lesion. Upper Abdomen: Cystic lesions are incompletely evaluated at the visualized upper pole of the kidneys bilaterally. No acute abnormality within the abdomen. Musculoskeletal: No acute bone abnormality. Review of the MIP images confirms the above findings. IMPRESSION: Acute, moderate pulmonary embolism. Enlargement of the right ventricle with inversion of the normal RV/LV ratio in keeping with CT evidence of right heart strain. Reflux of contrast into the hepatic venous system compatible with at least some degree of right heart failure. Bronchial [place] thickening, peribronchial nodularity, bronchiectasis predominantly within the right upper lobe in keeping with changes of atypical infection as can be seen with mycobacterium avium intracellulare. Milder changes noted within the right lower lobe. Diffuse bronchial Lenger thickening in keeping with airway inflammation. Subtle focal infiltrate within the posterior basal left lower lobe and basilar lingula possibly representing developing pulmonary infarct. Trace associated left pleural effusion. Dilation of the thoracic aortic arch measuring 3.3 cm in maximal dimension. Recommend annual imaging followup by CTA or MRA. This  recommendation follows 2010 ACCF/AHA/AATS/ACR/ASA/SCA/SCAI/SIR/STS/SVM Guidelines for the Diagnosis and Management of Patients with Thoracic Aortic Disease.  Circulation.2010; 121: Z610-R604. Aortic aneurysm NOS (ICD10-I71.9) Attempts are being made at this time to contact the managing physician for direct communication. Aortic Atherosclerosis (ICD10-I70.0). Electronically Signed: By: Helyn Numbers MD On: 02/24/2020 03:08   DG Chest Portable 1 View  Result Date: 02/23/2020 CLINICAL DATA:  Chest pain EXAM: PORTABLE CHEST 1 VIEW COMPARISON:  None. FINDINGS: The heart size and mediastinal contours are within normal limits. Aortic knob calcifications are seen. Both lungs are clear. The visualized skeletal structures are unremarkable. IMPRESSION: No active disease. Electronically Signed   By: Jonna Clark M.D.   On: 02/23/2020 22:54   CT Renal Stone Study  Result Date: 02/24/2020 CLINICAL DATA:  Left rib, flank pain EXAM: CT ABDOMEN AND PELVIS WITHOUT CONTRAST TECHNIQUE: Multidetector CT imaging of the abdomen and pelvis was performed following the standard protocol without IV contrast. COMPARISON:  None. FINDINGS: Lower chest: Small left pleural effusion with some adjacent ground-glass, favoring mixed passive and dependent atelectasis. Additional scarring and architectural distortion seen the lingula. Cardiac size is top. No pericardial effusion. Aortic leaflet calcifications. Coronary artery atherosclerosis is noted as well. Hepatobiliary: No discernible focal liver lesion within the limitations of this unenhanced CT. Smooth liver surface contour. Normal liver attenuation. Gallbladder partially decompressed. No pericholecystic fluid or inflammation. No visible calcified gallstone or biliary ductal dilatation. Pancreas: No pancreatic ductal dilatation or surrounding inflammatory changes. Spleen: Normal in size. No concerning splenic lesions. Adrenals/Urinary Tract: Normal adrenal glands. Horseshoe morphology  of the kidneys with a thin parenchymal band extending posterior to the IMA. Extreme hydroureteronephrosis of the right renal moiety to the level of a large calculus positioned at the right ureterovesicular junction measuring up to 15 mm in diameter and over 33 mm in length partially protruding through the ureterovesicular orifice. Some associated stranding is noted along the right ureter as well. There is significant cortical thinning on the right as well with numerous cystic appearing foci, some of which could reflect dilated calices though are incompletely assessed on this unenhanced CT. Several of these demonstrate some peripheral thin calcifications which could reflect additional nonobstructing calculi or mural calcifications of cysts. More mild-to-moderate cortical thinning is seen in the left kidney with several fluid attenuation cortical and parapelvic renal cysts. A lobular, distended appearance of the left renal pelvis is seen with abrupt transition to a more normal caliber distal ureter at the level of the ureteropelvic junction, possibly reflecting a chronic UPJ obstruction though comparison imaging is unavailable. No left urinary tract calculi are seen. Circumferential thickening of the bladder with multiple diverticular outpouchings and indentation of the bladder base by the enlarged prostate. Stomach/Bowel: Distal esophagus, stomach and duodenal sweep are unremarkable. No small bowel Mausolf thickening or dilatation. No evidence of obstruction. Appendix is not visualized. No focal inflammation the vicinity of the cecum to suggest an occult appendicitis. No colonic dilatation or Rheaume thickening. Vascular/Lymphatic: Atherosclerotic calcifications within the abdominal aorta and branch vessels. No aneurysm or ectasia. No enlarged abdominopelvic lymph nodes. Reproductive: Prostatomegaly with indentation of bladder base as above. Coarse eccentric calcification of the prostate. No other significant or concerning  abnormalities of the prostate or seminal vesicles. Other: No abdominopelvic free fluid or free gas. No bowel containing hernias. Fat containing left inguinal hernia. Musculoskeletal: Likely remote anterior compression deformity at L1 with up to 40% height loss. No other vertebral body height loss or compression deformity is seen. No acute fracture or traumatic osseous abnormality. Multilevel degenerative changes are present in the spine, hips and pelvis. No suspicious lytic  or blastic lesions. IMPRESSION: 1. Horseshoe morphology of the kidneys with severe hydroureteronephrosis of the right renal moiety to the level of a large calculus positioned at the right ureterovesicular junction measuring up to 15 mm in diameter and over 33 mm in length partially protruding through the ureterovesicular junction. 2. There is extreme cortical thinning on the right as well with numerous cystic appearing foci, some of which may reflect dilated calices though are incompletely assessed on this unenhanced CT. Several of these demonstrate some peripheral thin calcifications which could reflect additional nonobstructing calculi or mural calcifications of cysts. Could consider further characterization with renal MRI. 3. More mild to moderate cortical thinning of the left kidney with a lobular, distended appearance of the left renal pelvis with abrupt transition to a more normal caliber ureter at the level of the ureteropelvic junction, possibly reflecting a chronic UPJ obstruction though comparison imaging is unavailable. 4. Prostatomegaly with indentation of the bladder base by the enlarged prostate. Circumferential thickening of the bladder with multiple diverticular outpouchings and indentation of the bladder base by the enlarged prostate. While findings could reflect sequela of chronic outlet obstruction given some perivesicular hazy stranding as well as nonspecific stranding along the distended right ureter, should assess with  urinalysis to exclude a superimposed ascending tract infection. 5. Small left pleural effusion with some adjacent ground-glass, favoring mixed passive and dependent atelectasis. 6. Aortic Atherosclerosis (ICD10-I70.0). Electronically Signed   By: Kreg Shropshire M.D.   On: 02/24/2020 03:11    I independently reviewed the above imaging studies.  Impression/Recommendation 86yo M admitted for treatment of acute PE found incidentally on imaging to have large 3cm right obstructing UVJ stone with associated severe hydroureteronephrosis with cortical thinning consistent with chronic obstruction. He is HDS, afebrile, no leukocytosis, Cr elevated to 1.6 with unknown baseline and UA with leukocytes and bacteria.   - Given no acute signs of systemic infection and imaging findings of cortical thinning and lack of right-sided pain, obstruction is likely chronic and no acute urologic intervention is indicated at this time. - would treat empirically for UTI with abx for complicated UTI (10-14d) in the setting of concerning UA and intermittent burning with urination that patient reports - follow up UCx results and narrow therapy as appropriate - development of hematuria is expected in the setting of new blood thinner and obstructing stone. As long as patient is emptying bladder and Hgb is stable, no urologic intervention will be recommended. - if patient becomes hemodynamically unstable, there is concern for worsening infection of urinary source, then intervention may be recommended for urinary decompression on the right side. Ureteral stenting would likely be very difficult in the setting of enlarged prostate and large distal stone, however percutaneous decompression with nephrostomy tube would not be ideal in the setting of anticoagulation. - will plan for outpatient urologic follow up at this time for discussion regarding stone management - please call urology with any questions or concerns.   Corey Cummings 02/24/2020, 8:00 AM

## 2020-02-24 NOTE — ED Notes (Signed)
Urine culture sent down with u/a 

## 2020-02-24 NOTE — Progress Notes (Signed)
ANTICOAGULATION CONSULT NOTE - Follow Up Consult  Pharmacy Consult for heparin Indication: pulmonary embolus  Patient Measurements: Height: 5\' 9"  (175.3 cm) Weight: 67.6 kg (149 lb) IBW/kg (Calculated) : 70.7  Vital Signs: Temp: 98.6 F (37 C) (11/17 1944) Temp Source: Oral (11/17 1944) BP: 136/84 (11/17 2021) Pulse Rate: 87 (11/17 1944)  Labs: Recent Labs    02/23/20 2232 02/24/20 0032 02/24/20 1223 02/24/20 2145  HGB 12.1*  --   --   --   HCT 40.1  --   --   --   PLT 166  --   --   --   HEPARINUNFRC  --   --  0.27* 0.45  CREATININE 1.67*  --  1.36*  --   TROPONINIHS 43* 45*  --   --     Estimated Creatinine Clearance: 36.6 mL/min (A) (by C-G formula based on SCr of 1.36 mg/dL (H)).   Medical History: Past Medical History:  Diagnosis Date  . BPH (benign prostatic hyperplasia)   . Hypertension     Assessment: 86yo male c/o left-sided pain and SOB, CT reveals PE w/ RHS. On IV Heparin.   Initial HL is slightly subtherapeutic at 0.27 on 1150 units/hr. CBC noted.   11/17 PM update:  Heparin level at goal x 1   Goal of Therapy:  Heparin level 0.3-0.7 units/ml Monitor platelets by anticoagulation protocol: Yes   Plan:  -Cont heparin at 1250 units/hr -Confirmatory heparin level with AM labs  12/17, PharmD, BCPS Clinical Pharmacist Phone: [phone]

## 2020-02-24 NOTE — ED Notes (Signed)
Patient transported to CT 

## 2020-02-24 NOTE — Progress Notes (Signed)
Lower extremity venous has been completed.   Preliminary results in CV Proc.   Blanch Media 02/24/2020 10:47 AM

## 2020-02-24 NOTE — Progress Notes (Signed)
  Echocardiogram 2D Echocardiogram has been performed.  Corey Cummings 02/24/2020, 11:16 AM

## 2020-02-24 NOTE — H&P (Signed)
History and Physical    Asael Pann YQM:[id number] DOB: 09/27/1930 DOA: 02/23/2020  PCP: Julian Reil, PA   Patient coming from: Home   Chief Complaint: Pleuritic pain, SOB   HPI: Edu On is a 86 y.o. male with medical history significant for BPH and hypertension, now presenting to emergency department for evaluation of pleuritic pain and shortness of breath.  Patient reports that he received his second dose of the Pfizer Covid vaccine 1 week ago, noticed some mild left lower extremity swelling, but had otherwise been in his usual state until yesterday morning when he experienced some mild left-sided pleuritic pain.  That pain subsided for a while until last night when it returned and was more intense.  He also developed associated shortness of breath last night.  He has not been coughing and denies any recent fevers or chills.  Denies any recent prolonged immobilization, surgery, or family history of DVT or PE.  He has also noted some burning with urination that seems to be intermittent.  ED Course: Upon arrival to the ED, patient is found to be afebrile, saturating in the low 90s on room air, mildly tachypneic and mildly tachycardic with stable blood pressure.  EKG features a sinus tachycardia with rate 102.  Troponin is elevated to 43 and then 45 2 hours later.  Chest x-ray was negative for acute cardiopulmonary disease.  Chemistry panel notable for creatinine 1.67 with no prior labs available for comparison.  CBC unremarkable.  COVID-19 PCR is negative.  D-dimer is elevated to 8.15.  CTA chest is concerning for acute pulmonary embolism with heart strain.  Also noted on CTA is thoracic aortic dilation and bronchiectasis.  Urine was sent for culture and the patient was treated with IV heparin, a liter of saline, fentanyl, and Rocephin.  Urology was consulted by the ED physician.  Hospitalists were asked to admit.  Review of Systems:  All other systems reviewed and apart from HPI, are  negative.  Past Medical History:  Diagnosis Date  . BPH (benign prostatic hyperplasia)   . Hypertension     History reviewed. No pertinent surgical history.  Social History:   reports that he has quit smoking. His smoking use included cigarettes. He does not have any smokeless tobacco history on file. No history on file for alcohol use and drug use.  Not on File  History reviewed. No pertinent family history.   Prior to Admission medications   Not on File    Physical Exam: Vitals:   02/24/20 0145 02/24/20 0305 02/24/20 0330 02/24/20 0415  BP: (!) 147/83 117/87 (!) 162/104 (!) 147/93  Pulse: 84 93 (!) 102 90  Resp: (!) 26 (!) 22 (!) 23 18  Temp:      TempSrc:      SpO2: 94% 92% 94% 93%  Weight:      Height:        Constitutional: NAD, calm  Eyes: PERTLA, lids and conjunctivae normal ENMT: Mucous membranes are moist. Posterior pharynx clear of any exudate or lesions.   Neck: normal, supple, no masses, no thyromegaly Respiratory:  no wheezing, no crackles. No accessory muscle use.  Cardiovascular: S1 & S2 heard, regular rate and rhythm. No extremity edema.   Abdomen: No distension, no tenderness, soft. Bowel sounds active.  Musculoskeletal: no clubbing / cyanosis. No joint deformity upper and lower extremities.   Skin: no significant rashes, lesions, ulcers. Warm, dry, well-perfused. Neurologic: CN 2-12 grossly intact. Sensation intact. Moving all extremities.  Psychiatric: Alert  and oriented to person, place, and situation. Very pleasant and cooperative.    Labs and Imaging on Admission: I have personally reviewed following labs and imaging studies  CBC: Recent Labs  Lab 02/23/20 2232  WBC 9.9  NEUTROABS 7.8*  HGB 12.1*  HCT 40.1  MCV 89.7  PLT 166   Basic Metabolic Panel: Recent Labs  Lab 02/23/20 2232  NA 138  K 4.0  CL 104  CO2 23  GLUCOSE 172*  BUN 32*  CREATININE 1.67*  CALCIUM 9.3   GFR: Estimated Creatinine Clearance: 31.2 mL/min (A) (by  C-G formula based on SCr of 1.67 mg/dL (H)). Liver Function Tests: Recent Labs  Lab 02/23/20 2232  AST 15  ALT 11  ALKPHOS 53  BILITOT 0.8  PROT 6.7  ALBUMIN 2.9*   Recent Labs  Lab 02/23/20 2232  LIPASE 28   No results for input(s): AMMONIA in the last 168 hours. Coagulation Profile: No results for input(s): INR, PROTIME in the last 168 hours. Cardiac Enzymes: No results for input(s): CKTOTAL, CKMB, CKMBINDEX, TROPONINI in the last 168 hours. BNP (last 3 results) No results for input(s): PROBNP in the last 8760 hours. HbA1C: No results for input(s): HGBA1C in the last 72 hours. CBG: No results for input(s): GLUCAP in the last 168 hours. Lipid Profile: No results for input(s): CHOL, HDL, LDLCALC, TRIG, CHOLHDL, LDLDIRECT in the last 72 hours. Thyroid Function Tests: No results for input(s): TSH, T4TOTAL, FREET4, T3FREE, THYROIDAB in the last 72 hours. Anemia Panel: No results for input(s): VITAMINB12, FOLATE, FERRITIN, TIBC, IRON, RETICCTPCT in the last 72 hours. Urine analysis:    Component Value Date/Time   COLORURINE AMBER (A) 02/24/2020 0328   APPEARANCEUR CLOUDY (A) 02/24/2020 0328   LABSPEC 1.013 02/24/2020 0328   PHURINE 6.0 02/24/2020 0328   GLUCOSEU NEGATIVE 02/24/2020 0328   HGBUR MODERATE (A) 02/24/2020 0328   BILIRUBINUR NEGATIVE 02/24/2020 0328   KETONESUR NEGATIVE 02/24/2020 0328   PROTEINUR 30 (A) 02/24/2020 0328   NITRITE NEGATIVE 02/24/2020 0328   LEUKOCYTESUR LARGE (A) 02/24/2020 0328   Sepsis Labs: (procalcitonin:4,lacticidven:4) ) Recent Results (from the past 240 hour(s))  Respiratory Panel by RT PCR (Flu A&B, Covid) - Nasopharyngeal Swab     Status: None   Collection Time: 02/24/20 12:36 AM   Specimen: Nasopharyngeal Swab  Result Value Ref Range Status   SARS Coronavirus 2 by RT PCR NEGATIVE NEGATIVE Final    Comment: (NOTE) SARS-CoV-2 target nucleic acids are NOT DETECTED.  The SARS-CoV-2 RNA is generally detectable in  upper respiratoy specimens during the acute phase of infection. The lowest concentration of SARS-CoV-2 viral copies this assay can detect is 131 copies/mL. A negative result does not preclude SARS-Cov-2 infection and should not be used as the sole basis for treatment or other patient management decisions. A negative result may occur with  improper specimen collection/handling, submission of specimen other than nasopharyngeal swab, presence of viral mutation(s) within the areas targeted by this assay, and inadequate number of viral copies (<131 copies/mL). A negative result must be combined with clinical observations, patient history, and epidemiological information. The expected result is Negative.  Fact Sheet for Patients:  https://www.moore.com/  Fact Sheet for Healthcare Providers:  https://www.young.biz/  This test is no t yet approved or cleared by the [place] FDA and  has been authorized for detection and/or diagnosis of SARS-CoV-2 by FDA under an Emergency Use Authorization (EUA). This EUA will remain  in effect (meaning this test can be used) for the  duration of the COVID-19 declaration under Section 564(b)(1) of the Act, 21 U.S.C. section 360bbb-3(b)(1), unless the authorization is terminated or revoked sooner.     Influenza A by PCR NEGATIVE NEGATIVE Final   Influenza B by PCR NEGATIVE NEGATIVE Final    Comment: (NOTE) The Xpert Xpress SARS-CoV-2/FLU/RSV assay is intended as an aid in  the diagnosis of influenza from Nasopharyngeal swab specimens and  should not be used as a sole basis for treatment. Nasal washings and  aspirates are unacceptable for Xpert Xpress SARS-CoV-2/FLU/RSV  testing.  Fact Sheet for Patients: https://www.moore.com/  Fact Sheet for Healthcare Providers: https://www.young.biz/  This test is not yet approved or cleared by the [place] FDA and  has been  authorized for detection and/or diagnosis of SARS-CoV-2 by  FDA under an Emergency Use Authorization (EUA). This EUA will remain  in effect (meaning this test can be used) for the duration of the  Covid-19 declaration under Section 564(b)(1) of the Act, 21  U.S.C. section 360bbb-3(b)(1), unless the authorization is  terminated or revoked. Performed at [facility], [address], [place], [place] [address]      Radiological Exams on Admission: CT Angio Chest PE W and/or Wo Contrast  Addendum Date: 02/24/2020   ADDENDUM REPORT: 02/24/2020 03:35 ADDENDUM: These results were called by telephone at the time of interpretation on 02/24/2020 at 3:35 am to provider MIA [facility] , who verbally acknowledged these results. Electronically Signed   By: Helyn Numbers MD   On: 02/24/2020 03:35   Result Date: 02/24/2020 CLINICAL DATA:  Left chest pain, dyspnea EXAM: CT ANGIOGRAPHY CHEST WITH CONTRAST TECHNIQUE: Multidetector CT imaging of the chest was performed using the standard protocol during bolus administration of intravenous contrast. Multiplanar CT image reconstructions and MIPs were obtained to evaluate the vascular anatomy. CONTRAST:  1mL OMNIPAQUE IOHEXOL 350 MG/ML SOLN COMPARISON:  None. FINDINGS: Cardiovascular: There is excellent opacification of the pulmonary arterial tree. There are multiple branching intraluminal filling defects identified within the segmental pulmonary arteries of the left upper lobe, lingula, left lower lobe, and right lower lobe compatible with acute pulmonary embolism. The embolic burden is moderate. The central pulmonary arteries are of normal caliber. There is relative enlargement of the right ventricle in relation to the left compatible with CT evidence of right heart strain. There is reflux of contrast into the hepatic venous system in keeping with at least some degree of right heart failure. Mild coronary artery calcification. Global cardiac size within normal  limits. No pericardial effusion. The thoracic aorta is dilated in its descending segment at the aortic arch measuring 3.3 cm in greatest dimension. This vessel measures 3.1 cm in dimension at the level of the left atrium. Mild atherosclerotic calcification within the thoracic aorta. Mediastinum/Nodes: There is asymmetric enlargement of the left thyroid lobe with minimal mass effect upon the airway. No underlying nodularity. No pathologic thoracic adenopathy. The esophagus is unremarkable. Lungs/Pleura: There is widespread bronchial Butters thickening in keeping with airway inflammation. There is mild associated bronchiectasis within the right upper lobe with peribronchial nodularity and peribronchial thickening. Together, the findings are suspicious for changes of atypical infection including mycobacterium avium intracellulare or fungal infection. Milder changes are noted within the right lower lobe. There is more focal consolidation within the posterior basal left lower lobe and basilar lingula which may reflect hemorrhagic infiltrate in the setting of a pulmonary infarct. Tiny left pleural effusion is present. No pneumothorax. No central obstructing lesion. Upper Abdomen: Cystic lesions are  incompletely evaluated at the visualized upper pole of the kidneys bilaterally. No acute abnormality within the abdomen. Musculoskeletal: No acute bone abnormality. Review of the MIP images confirms the above findings. IMPRESSION: Acute, moderate pulmonary embolism. Enlargement of the right ventricle with inversion of the normal RV/LV ratio in keeping with CT evidence of right heart strain. Reflux of contrast into the hepatic venous system compatible with at least some degree of right heart failure. Bronchial Ravelo thickening, peribronchial nodularity, bronchiectasis predominantly within the right upper lobe in keeping with changes of atypical infection as can be seen with mycobacterium avium intracellulare. Milder changes noted  within the right lower lobe. Diffuse bronchial Polendo thickening in keeping with airway inflammation. Subtle focal infiltrate within the posterior basal left lower lobe and basilar lingula possibly representing developing pulmonary infarct. Trace associated left pleural effusion. Dilation of the thoracic aortic arch measuring 3.3 cm in maximal dimension. Recommend annual imaging followup by CTA or MRA. This recommendation follows 2010 ACCF/AHA/AATS/ACR/ASA/SCA/SCAI/SIR/STS/SVM Guidelines for the Diagnosis and Management of Patients with Thoracic Aortic Disease. Circulation.2010; 121: I967-E938. Aortic aneurysm NOS (ICD10-I71.9) Attempts are being made at this time to contact the managing physician for direct communication. Aortic Atherosclerosis (ICD10-I70.0). Electronically Signed: By: Helyn Numbers MD On: 02/24/2020 03:08   DG Chest Portable 1 View  Result Date: 02/23/2020 CLINICAL DATA:  Chest pain EXAM: PORTABLE CHEST 1 VIEW COMPARISON:  None. FINDINGS: The heart size and mediastinal contours are within normal limits. Aortic knob calcifications are seen. Both lungs are clear. The visualized skeletal structures are unremarkable. IMPRESSION: No active disease. Electronically Signed   By: Jonna Clark M.D.   On: 02/23/2020 22:54   CT Renal Stone Study  Result Date: 02/24/2020 CLINICAL DATA:  Left rib, flank pain EXAM: CT ABDOMEN AND PELVIS WITHOUT CONTRAST TECHNIQUE: Multidetector CT imaging of the abdomen and pelvis was performed following the standard protocol without IV contrast. COMPARISON:  None. FINDINGS: Lower chest: Small left pleural effusion with some adjacent ground-glass, favoring mixed passive and dependent atelectasis. Additional scarring and architectural distortion seen the lingula. Cardiac size is top. No pericardial effusion. Aortic leaflet calcifications. Coronary artery atherosclerosis is noted as well. Hepatobiliary: No discernible focal liver lesion within the limitations of this  unenhanced CT. Smooth liver surface contour. Normal liver attenuation. Gallbladder partially decompressed. No pericholecystic fluid or inflammation. No visible calcified gallstone or biliary ductal dilatation. Pancreas: No pancreatic ductal dilatation or surrounding inflammatory changes. Spleen: Normal in size. No concerning splenic lesions. Adrenals/Urinary Tract: Normal adrenal glands. Horseshoe morphology of the kidneys with a thin parenchymal band extending posterior to the IMA. Extreme hydroureteronephrosis of the right renal moiety to the level of a large calculus positioned at the right ureterovesicular junction measuring up to 15 mm in diameter and over 33 mm in length partially protruding through the ureterovesicular orifice. Some associated stranding is noted along the right ureter as well. There is significant cortical thinning on the right as well with numerous cystic appearing foci, some of which could reflect dilated calices though are incompletely assessed on this unenhanced CT. Several of these demonstrate some peripheral thin calcifications which could reflect additional nonobstructing calculi or mural calcifications of cysts. More mild-to-moderate cortical thinning is seen in the left kidney with several fluid attenuation cortical and parapelvic renal cysts. A lobular, distended appearance of the left renal pelvis is seen with abrupt transition to a more normal caliber distal ureter at the level of the ureteropelvic junction, possibly reflecting a chronic UPJ obstruction though comparison imaging is  unavailable. No left urinary tract calculi are seen. Circumferential thickening of the bladder with multiple diverticular outpouchings and indentation of the bladder base by the enlarged prostate. Stomach/Bowel: Distal esophagus, stomach and duodenal sweep are unremarkable. No small bowel Demarest thickening or dilatation. No evidence of obstruction. Appendix is not visualized. No focal inflammation the  vicinity of the cecum to suggest an occult appendicitis. No colonic dilatation or Simmon thickening. Vascular/Lymphatic: Atherosclerotic calcifications within the abdominal aorta and branch vessels. No aneurysm or ectasia. No enlarged abdominopelvic lymph nodes. Reproductive: Prostatomegaly with indentation of bladder base as above. Coarse eccentric calcification of the prostate. No other significant or concerning abnormalities of the prostate or seminal vesicles. Other: No abdominopelvic free fluid or free gas. No bowel containing hernias. Fat containing left inguinal hernia. Musculoskeletal: Likely remote anterior compression deformity at L1 with up to 40% height loss. No other vertebral body height loss or compression deformity is seen. No acute fracture or traumatic osseous abnormality. Multilevel degenerative changes are present in the spine, hips and pelvis. No suspicious lytic or blastic lesions. IMPRESSION: 1. Horseshoe morphology of the kidneys with severe hydroureteronephrosis of the right renal moiety to the level of a large calculus positioned at the right ureterovesicular junction measuring up to 15 mm in diameter and over 33 mm in length partially protruding through the ureterovesicular junction. 2. There is extreme cortical thinning on the right as well with numerous cystic appearing foci, some of which may reflect dilated calices though are incompletely assessed on this unenhanced CT. Several of these demonstrate some peripheral thin calcifications which could reflect additional nonobstructing calculi or mural calcifications of cysts. Could consider further characterization with renal MRI. 3. More mild to moderate cortical thinning of the left kidney with a lobular, distended appearance of the left renal pelvis with abrupt transition to a more normal caliber ureter at the level of the ureteropelvic junction, possibly reflecting a chronic UPJ obstruction though comparison imaging is unavailable. 4.  Prostatomegaly with indentation of the bladder base by the enlarged prostate. Circumferential thickening of the bladder with multiple diverticular outpouchings and indentation of the bladder base by the enlarged prostate. While findings could reflect sequela of chronic outlet obstruction given some perivesicular hazy stranding as well as nonspecific stranding along the distended right ureter, should assess with urinalysis to exclude a superimposed ascending tract infection. 5. Small left pleural effusion with some adjacent ground-glass, favoring mixed passive and dependent atelectasis. 6. Aortic Atherosclerosis (ICD10-I70.0). Electronically Signed   By: Kreg Shropshire M.D.   On: 02/24/2020 03:11    EKG: Independently reviewed. Sinus tachycardia, rate 102.   Assessment/Plan   1. Acute pulmonary embolism  - Patient presents with one day of left-sided pleuritic pain and DOE and is found to have PE with CT-evidence for heart strain  - He is hemodynamically-stable, not hypoxic, and has mild troponin elevation  - He reports recent LLE swelling without tenderness or color change  - Unclear what precipitated this  - IV heparin started in ED  - Continue IV heparin, check BNP, check echocardiogram, check LE venous US, continue cardiac monitoring, supportive care   2. Right ureteral obstruction; UTI  - Patient complains of intermittent dysuria, UA compatible with infection but no fever or leukocytosis, and CT stone study reveals horseshoe kidney with large obstructing stone at right UVJ and question of possible left UPJ obstruction  - Urine was sent for culture and Rocephin started on ED  - Urology consulting and much appreciated  - Continue Rocephin,  follow culture and clinical course, follow-up urology recommendations   3. Renal insufficiency  - SCr is 1.67 on admission with no prior labs available for comparison  - He has obstructing ureteral stone as above  - Renally-dose medications, continue IVF  hydration, hold ARB and HCTZ for now   4. Hypertension  - BP at goal  - Hold ARB and HCTZ for now given renal insufficiency of uncertain chronicity    5. Thoracic aortic dilation  - Dilation of thoracic aorta noted incidentally on CTA in ED  - Annual follow-up imaging is recommended, discussed with patient and his family   DVT prophylaxis: IV heparin  Code Status: DNR, confirmed with patient and family on admission  Family Communication: Family updated at bedside Disposition Plan:  Patient is from: Home  Anticipated d/c is to: TBD Anticipated d/c date is: 4-5 days  Patient currently: Pending echocardiogram, LE venous dopplers, urology consultation, may need PCCM or IR consultation based on echo findings and clinical course.  Consults called: Urology  Admission status: Inpatient     Briscoe Deutscherimothy S Mikayla Chiusano, MD [facility]  02/24/2020, 4:52 AM

## 2020-02-24 NOTE — Progress Notes (Signed)
ANTICOAGULATION CONSULT NOTE - Follow Up Consult  Pharmacy Consult for heparin Indication: pulmonary embolus  Patient Measurements: Height: 5\' 9"  (175.3 cm) Weight: 74.4 kg (164 lb) IBW/kg (Calculated) : 70.7  Vital Signs: Temp: 98.4 F (36.9 C) (11/17 0811) Temp Source: Oral (11/17 0811) BP: 166/96 (11/17 1216) Pulse Rate: 84 (11/17 1216)  Labs: Recent Labs    02/23/20 2232 02/24/20 0032 02/24/20 1223  HGB 12.1*  --   --   HCT 40.1  --   --   PLT 166  --   --   HEPARINUNFRC  --   --  0.27*  CREATININE 1.67*  --  1.36*  TROPONINIHS 43* 45*  --     Estimated Creatinine Clearance: 38.3 mL/min (A) (by C-G formula based on SCr of 1.36 mg/dL (H)).   Medical History: Past Medical History:  Diagnosis Date  . BPH (benign prostatic hyperplasia)   . Hypertension     Assessment: 86yo male c/o left-sided pain and SOB, CT reveals PE w/ RHS. On IV Heparin.   Initial HL is slightly subtherapeutic at 0.27 on 1150 units/hr. CBC noted.   Goal of Therapy:  Heparin level 0.3-0.7 units/ml Monitor platelets by anticoagulation protocol: Yes   Plan:  -Heparin 1000 units IV bolus, then increase IV heparin infusion 1250 units/hr  -F/u 8 hr HL -Monitor daily HL, CBC and s/s of bleeding   86yo, PharmD., BCPS, BCCCP Clinical Pharmacist Please refer to [facility] for unit-specific pharmacist

## 2020-02-24 NOTE — Progress Notes (Signed)
ANTICOAGULATION CONSULT NOTE - Initial Consult  Pharmacy Consult for heparin Indication: pulmonary embolus  Patient Measurements: Height: 5\' 9"  (175.3 cm) Weight: 74.4 kg (164 lb) IBW/kg (Calculated) : 70.7  Vital Signs: Temp: 98.2 F (36.8 C) (11/16 2231) Temp Source: Oral (11/16 2231) BP: 117/87 (11/17 0305) Pulse Rate: 93 (11/17 0305)  Labs: Recent Labs    02/23/20 2232 02/24/20 0032  HGB 12.1*  --   HCT 40.1  --   PLT 166  --   CREATININE 1.67*  --   TROPONINIHS 43* 45*    Estimated Creatinine Clearance: 31.2 mL/min (A) (by C-G formula based on SCr of 1.67 mg/dL (H)).   Medical History: Past Medical History:  Diagnosis Date  . BPH (benign prostatic hyperplasia)   . Hypertension     Assessment: 86yo male c/o left-sided pain and SOB, CT reveals PE w/ RHS, to begin heparin.  Goal of Therapy:  Heparin level 0.3-0.7 units/ml Monitor platelets by anticoagulation protocol: Yes   Plan:  Will give heparin 4000 units IV bolus x1 followed by gtt at 1150 units/hr and monitor heparin levels and CBC.  86yo, PharmD, BCPS  02/24/2020,3:21 AM

## 2020-02-24 NOTE — ED Provider Notes (Signed)
Patient seen/examined in the Emergency Department in conjunction with Advanced Practice Provider McDonald Patient reports left sided "rib" pain.  Reports it hurts to take a deep breath Exam : awake/alert, tachycardic and mildly tachypneic Plan: pt will undergo evaluation for PE.      Zadie Rhine, MD 02/24/20 0001

## 2020-02-24 NOTE — Consult Note (Signed)
Formal note to follow:  Incidental finding of large stone in right UVJ in horseshoe kidney.  Right collecting system is severely hydronephrotic with significant coritcal atrophy suggesting chronic obstruction.  No pain.  He is afebrile with no worsening voiding symptoms.  UA noted, would treat empirically for UTI and f/u cultures.  No immediate intervention necessary for urolithiasis.

## 2020-02-24 NOTE — Progress Notes (Addendum)
PROGRESS NOTE    Corey Cummings  NAT:[id number] DOB: Apr 26, 1930 DOA: 02/23/2020 PCP: Julian Reil, PA    Brief Narrative:  Corey Cummings was admitted to the hospital with the working diagnosis of acute pulmonary embolism.  86 year old male past medical history for hypertension and BPH who presents with pleuritic chest pain and dyspnea.  Mild left lower extremity edema over the last week.  24 hours prior to admission mild left-sided pleuritic chest pain.  Patient had COVID-19 vaccination 1 week ago.  On his initial physical examination blood pressure 147/83, heart rate 84, respiratory 26, oxygen saturation 92%, his lungs were clear to auscultation bilaterally, heart S1-S2, present rhythmic, soft abdomen, no lower extremity edema.  Renal CT with horseshoe morphology of the kidneys with severe hydroureteronephrosis of the right kidney, large calculus positioned at the right ureterovesical junction measuring up to 15 mm in diameter. CT chest with acute moderate pulmonary embolism.  Assessment & Plan:   Principal Problem:   Acute pulmonary embolism (HCC) Active Problems:   Ureteral obstruction, right   UTI (urinary tract infection)   Renal insufficiency   Thoracic aortic aneurysm without rupture (HCC)   Bronchiectasis (HCC)   1. Acute pulmonary embolism. Continue anticoagulation with heparin for anticoagulation.   2. Right ureterovesical stone with right hydronephrosis. Urine infection. Continue antibiotic therapy with ceftriaxone.   3. AKI. Renal function with serum cr down to 1,36 with K at 4,0.   4. Bronchiectasis.  Continue close oxymetry monitoring.   5. HTN. Resume HCTZ, continue holding on losartan.   Patient continue to be at high risk for continue risk of respiratory failure.   Status is: Observation  The patient will require care spanning > 2 midnights and should be moved to inpatient because: IV treatments appropriate due to intensity of illness or inability to take  PO  Dispo: The patient is from: Home              Anticipated d/c is to: Home              Anticipated d/c date is: 3 days              Patient currently is not medically stable to d/c.    DVT prophylaxis: Heparin   Code Status:   DNR   Family Communication:  I spoke with patient's wife at the bedside, we talked in detail about patient's condition, plan of care and prognosis and all questions were addressed.     Antimicrobials:   Ceftriaxone     Subjective: Patient is feeling better, dyspnea has improved. No chest pain, no nausea or vomiting.   Objective: Vitals:   02/24/20 1331 02/24/20 1405 02/24/20 1500 02/24/20 1644  BP: (!) 152/96 139/87 (!) 137/112 (!) 151/92  Pulse: 79 83 88 81  Resp: (!) 22 17 (!) 24 (!) 21  Temp:      TempSrc:      SpO2: 92% 94% 99% 94%  Weight:      Height:        Intake/Output Summary (Last 24 hours) at 02/24/2020 1658 Last data filed at 02/24/2020 0505 Gross per 24 hour  Intake 1126.67 ml  Output --  Net 1126.67 ml   Filed Weights   02/23/20 2231  Weight: 74.4 kg    Examination:   General: Not in pain or dyspnea, deconditioned  Neurology: Awake and alert, non focal  E ENT: mild pallor, no icterus, oral mucosa moist Cardiovascular: No JVD. S1-S2 present, rhythmic, no gallops,  rubs, or murmurs. No lower extremity edema. Pulmonary: positive breath sounds bilaterally, adequate air movement, no wheezing, rhonchi or rales. Gastrointestinal. Abdomen not tender or distended Skin. No rashes Musculoskeletal: no joint deformities     Data Reviewed: I have personally reviewed following labs and imaging studies  CBC: Recent Labs  Lab 02/23/20 2232  WBC 9.9  NEUTROABS 7.8*  HGB 12.1*  HCT 40.1  MCV 89.7  PLT 166   Basic Metabolic Panel: Recent Labs  Lab 02/23/20 2232 02/24/20 0414 02/24/20 1223  NA 138  --  139  K 4.0  --  4.0  CL 104  --  108  CO2 23  --  22  GLUCOSE 172*  --  110*  BUN 32* 28* 26*  CREATININE  1.67*  --  1.36*  CALCIUM 9.3  --  9.0   GFR: Estimated Creatinine Clearance: 38.3 mL/min (A) (by C-G formula based on SCr of 1.36 mg/dL (H)). Liver Function Tests: Recent Labs  Lab 02/23/20 2232  AST 15  ALT 11  ALKPHOS 53  BILITOT 0.8  PROT 6.7  ALBUMIN 2.9*   Recent Labs  Lab 02/23/20 2232  LIPASE 28   No results for input(s): AMMONIA in the last 168 hours. Coagulation Profile: No results for input(s): INR, PROTIME in the last 168 hours. Cardiac Enzymes: No results for input(s): CKTOTAL, CKMB, CKMBINDEX, TROPONINI in the last 168 hours. BNP (last 3 results) No results for input(s): PROBNP in the last 8760 hours. HbA1C: No results for input(s): HGBA1C in the last 72 hours. CBG: No results for input(s): GLUCAP in the last 168 hours. Lipid Profile: No results for input(s): CHOL, HDL, LDLCALC, TRIG, CHOLHDL, LDLDIRECT in the last 72 hours. Thyroid Function Tests: No results for input(s): TSH, T4TOTAL, FREET4, T3FREE, THYROIDAB in the last 72 hours. Anemia Panel: No results for input(s): VITAMINB12, FOLATE, FERRITIN, TIBC, IRON, RETICCTPCT in the last 72 hours.    Radiology Studies: I have reviewed all of the imaging during this hospital visit personally     Scheduled Meds: . finasteride  5 mg Oral Daily  . sodium chloride flush  3 mL Intravenous Q12H   Continuous Infusions: . sodium chloride 100 mL/hr at 02/24/20 1550  . [START ON 02/25/2020] cefTRIAXone (ROCEPHIN)  IV    . heparin 1,250 Units/hr (02/24/20 1333)     LOS: 0 days        Corey Cummings Annett Gula, MD

## 2020-02-25 DIAGNOSIS — J479 Bronchiectasis, uncomplicated: Secondary | ICD-10-CM

## 2020-02-25 LAB — URINE CULTURE

## 2020-02-25 LAB — BASIC METABOLIC PANEL
Anion gap: 9 (ref 5–15)
BUN: 27 mg/dL — ABNORMAL HIGH (ref 8–23)
CO2: 22 mmol/L (ref 22–32)
Calcium: 8.7 mg/dL — ABNORMAL LOW (ref 8.9–10.3)
Chloride: 108 mmol/L (ref 98–111)
Creatinine, Ser: 1.53 mg/dL — ABNORMAL HIGH (ref 0.61–1.24)
GFR, Estimated: 44 mL/min — ABNORMAL LOW (ref >60)
Glucose, Bld: 98 mg/dL (ref 70–99)
Potassium: 4 mmol/L (ref 3.5–5.1)
Sodium: 139 mmol/L (ref 135–145)

## 2020-02-25 LAB — CBC
HCT: 33.5 % — ABNORMAL LOW (ref 39.0–52.0)
Hemoglobin: 10.6 g/dL — ABNORMAL LOW (ref 13.0–17.0)
MCH: 27.8 pg (ref 26.0–34.0)
MCHC: 31.6 g/dL (ref 30.0–36.0)
MCV: 87.9 fL (ref 80.0–100.0)
Platelets: 152 10*3/uL (ref 150–400)
RBC: 3.81 MIL/uL — ABNORMAL LOW (ref 4.22–5.81)
RDW: 14.6 % (ref 11.5–15.5)
WBC: 8.2 10*3/uL (ref 4.0–10.5)
nRBC: 0 % (ref 0.0–0.2)

## 2020-02-25 LAB — HEPARIN LEVEL (UNFRACTIONATED): Heparin Unfractionated: 0.37 IU/mL (ref 0.30–0.70)

## 2020-02-25 MED ORDER — APIXABAN 5 MG PO TABS: 5.0000 mg | ORAL_TABLET | Freq: Two times a day (BID) | ORAL | Status: DC

## 2020-02-25 MED ORDER — APIXABAN 5 MG PO TABS
10.0000 mg | ORAL_TABLET | Freq: Two times a day (BID) | ORAL | Status: DC
  Administered 2020-02-25 – 2020-02-26 (×3): 10 mg via ORAL
  Filled 2020-02-25 (×3): qty 2

## 2020-02-25 NOTE — Progress Notes (Addendum)
PROGRESS NOTE    Corey Cummings  NUU:[id number] DOB: 1930-10-23 DOA: 02/23/2020 PCP: Julian Reil, PA    Brief Narrative:  Mr. Hudspeth was admitted to the hospital with the working diagnosis of acute pulmonary embolism.  86 year old male past medical history for hypertension and BPH who presents with pleuritic chest pain and dyspnea.  Mild left lower extremity edema over the last week.  24 hours prior to admission mild left-sided pleuritic chest pain.  Patient had COVID-19 vaccination 1 week ago.  On his initial physical examination blood pressure 147/83, heart rate 84, respiratory 26, oxygen saturation 92%, his lungs were clear to auscultation bilaterally, heart S1-S2, present rhythmic, soft abdomen, no lower extremity edema.  Renal CT with horseshoe morphology of the kidneys with severe hydroureteronephrosis of the right kidney, large calculus positioned at the right ureterovesical junction measuring up to 15 mm in diameter. CT chest with acute moderate pulmonary embolism.   Assessment & Plan:   Principal Problem:   Acute pulmonary embolism (HCC) Active Problems:   Ureteral obstruction, right   UTI (urinary tract infection)   Renal insufficiency   Thoracic aortic aneurysm without rupture (HCC)   Bronchiectasis (HCC)   Pulmonary embolism (HCC)    1. Acute pulmonary embolism. Patient has remained hemodynamically stable, blood pressure systolic 146 to 152 mmHg.   Echocardiography with signs of RV strain with McConnell sign RV with moderate dilatation, moderate pulmonary hypertension.   Continue telemetry monitoring . Transition to oral anticoagulation with apixaban.   Out of bed to chair tid with meals, PT and OT evaluations.   2. Right ureterovesical stone with right hydronephrosis. Urine infection (present on admission). No hematuria, or abdominal pain, continue antibiotic therapy with IV ceftriaxone.  Urine culture contaminated.   Plan to continue antibiotic therapy for  10 days.   3. AKI. Worsening renal function with serum cr at 1,53 with K at 4,0 and bicarbonate at 22.  Not clear baseline available, possible CKD, will continue close monitoring. Ct imaging with extreme cortical thinning on the right with numerous cystic appearing foci. Left with cortical thining  4. Bronchiectasis.  No clinical signs of exacerbation, continue with oxymetry monitoring.  PRN bronchodilator therapy.   5. HTN. Continue to hold antihypertensive medications, to prevent hypotension. At home on losartan and HCTZ.   Patient continue to be at high risk for worsening RV failure.   Status is: Inpatient  Remains inpatient appropriate because:IV treatments appropriate due to intensity of illness or inability to take PO   Dispo: The patient is from: Home              Anticipated d/c is to: Home              Anticipated d/c date is: 2 days              Patient currently is not medically stable to d/c.    DVT prophylaxis: apixaban   Code Status:   dnr   Family Communication:  I spoke over the phone with the patient's daughter about patient's  condition, plan of care, prognosis and all questions were addressed.     Consultants:  Urology   Subjective: Patient is feeling better, no dyspnea or chest pain, no nausea or vomiting. No hematuria or abdomina pain.   Objective: Vitals:   02/25/20 1000 02/25/20 1100 02/25/20 1200 02/25/20 1222  BP: (!) 160/83 (!) 150/88 (!) 146/91 (!) 152/95  Pulse: 80 80  91  Resp: (!) 23 (!) 24 19  Temp:   98.6 F (37 C) 98.6 F (37 C)  TempSrc:   Oral Oral  SpO2: 95% 96%    Weight:      Height:        Intake/Output Summary (Last 24 hours) at 02/25/2020 1248 Last data filed at 02/25/2020 0529 Gross per 24 hour  Intake 699.6 ml  Output 700 ml  Net -0.4 ml   Filed Weights   02/23/20 2231 02/24/20 1700 02/25/20 0530  Weight: 74.4 kg 67.6 kg 67.5 kg    Examination:   General: Not in pain or dyspnea, deconditioned  Neurology:  Awake and alert, non focal  E ENT: no pallor, no icterus, oral mucosa moist Cardiovascular: No JVD. S1-S2 present, rhythmic, no gallops, rubs, or murmurs. No lower extremity edema. Pulmonary: positive breath sounds bilaterally with no wheezing, rhonchi or rales. Gastrointestinal. Abdomen soft and non tender Skin. No rashes Musculoskeletal: no joint deformities     Data Reviewed: I have personally reviewed following labs and imaging studies  CBC: Recent Labs  Lab 02/23/20 2232 02/25/20 0400  WBC 9.9 8.2  NEUTROABS 7.8*  --   HGB 12.1* 10.6*  HCT 40.1 33.5*  MCV 89.7 87.9  PLT 166 152   Basic Metabolic Panel: Recent Labs  Lab 02/23/20 2232 02/24/20 0414 02/24/20 1223 02/25/20 0400  NA 138  --  139 139  K 4.0  --  4.0 4.0  CL 104  --  108 108  CO2 23  --  22 22  GLUCOSE 172*  --  110* 98  BUN 32* 28* 26* 27*  CREATININE 1.67*  --  1.36* 1.53*  CALCIUM 9.3  --  9.0 8.7*   GFR: Estimated Creatinine Clearance: 32.5 mL/min (A) (by C-G formula based on SCr of 1.53 mg/dL (H)). Liver Function Tests: Recent Labs  Lab 02/23/20 2232  AST 15  ALT 11  ALKPHOS 53  BILITOT 0.8  PROT 6.7  ALBUMIN 2.9*   Recent Labs  Lab 02/23/20 2232  LIPASE 28   No results for input(s): AMMONIA in the last 168 hours. Coagulation Profile: No results for input(s): INR, PROTIME in the last 168 hours. Cardiac Enzymes: No results for input(s): CKTOTAL, CKMB, CKMBINDEX, TROPONINI in the last 168 hours. BNP (last 3 results) No results for input(s): PROBNP in the last 8760 hours. HbA1C: No results for input(s): HGBA1C in the last 72 hours. CBG: No results for input(s): GLUCAP in the last 168 hours. Lipid Profile: No results for input(s): CHOL, HDL, LDLCALC, TRIG, CHOLHDL, LDLDIRECT in the last 72 hours. Thyroid Function Tests: No results for input(s): TSH, T4TOTAL, FREET4, T3FREE, THYROIDAB in the last 72 hours. Anemia Panel: No results for input(s): VITAMINB12, FOLATE, FERRITIN,  TIBC, IRON, RETICCTPCT in the last 72 hours.    Radiology Studies: I have reviewed all of the imaging during this hospital visit personally     Scheduled Meds: . finasteride  5 mg Oral Daily  . sodium chloride flush  3 mL Intravenous Q12H   Continuous Infusions: . cefTRIAXone (ROCEPHIN)  IV 1 g (02/25/20 0529)  . heparin 1,250 Units/hr (02/25/20 0930)     LOS: 1 day        Samyah Bilbo Annett Gula, MD

## 2020-02-25 NOTE — Progress Notes (Signed)
Patient ambulated half the distance of hallway with writer plus one SBA with rolling walker,he tried with no o2 got short of breath so we went back to room he then placed his o2 back on. He has been titrated off heparin drip and onto xarelto. He ambulated to BR had large BM in commode then walked back to bed he was placed on tele is resting. NO distress noted.

## 2020-02-25 NOTE — Plan of Care (Signed)

## 2020-02-25 NOTE — Plan of Care (Signed)
Problem: Education: Goal: Knowledge of General Education information will improve Description: Including pain rating scale, medication(s)/side effects and non-pharmacologic comfort measures 02/25/2020 1216 by Saunders Revel, RN Outcome: Progressing 02/25/2020 1214 by Saunders Revel, RN Outcome: Progressing   Problem: Health Behavior/Discharge Planning: Goal: Ability to manage health-related needs will improve 02/25/2020 1216 by Saunders Revel, RN Outcome: Progressing 02/25/2020 1214 by Saunders Revel, RN Outcome: Progressing   Problem: Clinical Measurements: Goal: Ability to maintain clinical measurements within normal limits will improve 02/25/2020 1216 by Saunders Revel, RN Outcome: Progressing 02/25/2020 1214 by Saunders Revel, RN Outcome: Progressing Goal: Will remain free from infection 02/25/2020 1216 by Saunders Revel, RN Outcome: Progressing 02/25/2020 1214 by Saunders Revel, RN Outcome: Progressing Goal: Diagnostic test results will improve 02/25/2020 1216 by Saunders Revel, RN Outcome: Progressing 02/25/2020 1214 by Saunders Revel, RN Outcome: Progressing Goal: Respiratory complications will improve 02/25/2020 1216 by Saunders Revel, RN Outcome: Progressing 02/25/2020 1214 by Saunders Revel, RN Outcome: Progressing Goal: Cardiovascular complication will be avoided 02/25/2020 1216 by Saunders Revel, RN Outcome: Progressing 02/25/2020 1214 by Saunders Revel, RN Outcome: Progressing   Problem: Activity: Goal: Risk for activity intolerance will decrease 02/25/2020 1216 by Saunders Revel, RN Outcome: Progressing 02/25/2020 1214 by Saunders Revel, RN Outcome: Progressing   Problem: Nutrition: Goal: Adequate nutrition will be maintained 02/25/2020 1216 by Saunders Revel, RN Outcome: Progressing 02/25/2020 1214 by Saunders Revel, RN Outcome: Progressing   Problem: Coping: Goal: Level  of anxiety will decrease 02/25/2020 1216 by Saunders Revel, RN Outcome: Progressing 02/25/2020 1214 by Saunders Revel, RN Outcome: Progressing   Problem: Elimination: Goal: Will not experience complications related to bowel motility 02/25/2020 1216 by Saunders Revel, RN Outcome: Progressing 02/25/2020 1214 by Saunders Revel, RN Outcome: Progressing Goal: Will not experience complications related to urinary retention 02/25/2020 1216 by Saunders Revel, RN Outcome: Progressing 02/25/2020 1214 by Saunders Revel, RN Outcome: Progressing   Problem: Pain Managment: Goal: General experience of comfort will improve 02/25/2020 1216 by Saunders Revel, RN Outcome: Progressing 02/25/2020 1214 by Saunders Revel, RN Outcome: Progressing   Problem: Safety: Goal: Ability to remain free from injury will improve 02/25/2020 1216 by Saunders Revel, RN Outcome: Progressing 02/25/2020 1214 by Saunders Revel, RN Outcome: Progressing   Problem: Skin Integrity: Goal: Risk for impaired skin integrity will decrease 02/25/2020 1216 by Saunders Revel, RN Outcome: Progressing 02/25/2020 1214 by Saunders Revel, RN Outcome: Progressing   Problem: Consults Goal: Venous Thromboembolism Patient Education Description: See Patient Education Module for education specifics. Outcome: Progressing Goal: Diagnosis - Venous Thromboembolism (VTE) Description: Choose a selection Outcome: Progressing Goal: Pharmacy Consult for anticoagulation Outcome: Progressing Goal: Skin Care Protocol Initiated - if Braden Score 18 or less Description: If consults are not indicated, leave blank or document N/A Outcome: Progressing Goal: Nutrition Consult-if indicated Outcome: Progressing Goal: Diabetes Guidelines if Diabetic/Glucose > 140 Description: If diabetic or lab glucose is > 140 mg/dl - Initiate Diabetes/Hyperglycemia Guidelines & Document Interventions  Outcome:  Progressing   Problem: Phase I Progression Outcomes Goal: Pain controlled with appropriate interventions Outcome: Progressing Goal: Dyspnea controlled at rest (PE) Outcome: Progressing Goal: Tolerating diet Outcome: Progressing Goal: Initial discharge plan identified Outcome: Progressing Goal: Voiding-avoid urinary catheter unless indicated Outcome: Progressing Goal: Hemodynamically stable Outcome: Progressing Goal: Other Phase I Outcomes/Goals Outcome: Progressing   Problem: Phase II Progression Outcomes Goal: Therapeutic drug levels for  anticoagulation Outcome: Progressing Goal: 02 sats trending upward/stable (PE) Outcome: Progressing Goal: Discharge plan established Outcome: Progressing Goal: Tolerating diet Outcome: Progressing Goal: Other Phase II Outcomes/Goals Outcome: Progressing   Problem: Phase III Progression Outcomes Goal: 02 sats stabilized Outcome: Progressing Goal: Activity at appropriate level-compared to baseline Description: (UP IN CHAIR FOR HEMODIALYSIS) Outcome: Progressing Goal: Discharge plan remains appropriate-arrangements made Outcome: Progressing Goal: Other Phase III Outcomes/Goals Outcome: Progressing   Problem: Discharge Progression Outcomes Goal: Barriers To Progression Addressed/Resolved Outcome: Progressing Goal: Discharge plan in place and appropriate Outcome: Progressing Goal: Pain controlled with appropriate interventions Outcome: Progressing Goal: Hemodynamically stable Outcome: Progressing Goal: Complications resolved/controlled Outcome: Progressing Goal: Tolerating diet Outcome: Progressing Goal: Activity appropriate for discharge plan Outcome: Progressing Goal: Other Discharge Outcomes/Goals Outcome: Progressing

## 2020-02-25 NOTE — TOC Benefit Eligibility Note (Signed)
Transition of Care [facility]) Benefit Eligibility Note    Patient Details  Name: Corey Cummings MRN: [id number] Date of Birth: Nov 09, 1930   Medication/Dose: ELIQUIS  2.5 MG BID   and  ELIQUIS  5 MG BID  Covered?: Yes  Tier: 3 Drug  Prescription Coverage Preferred Pharmacy: [facility]  Spoke with Person/Company/Phone Number:: [place]  @  [employer] # [phone] OPT- 2  Co-Pay: $45.00   FOR EACH PRESCRIPTION  Prior Approval: No  Deductible:  (NO DEDUCTIBLE WITH PLAN)       Mardene Sayer Phone Number: 02/25/2020, 2:15 PM

## 2020-02-25 NOTE — Progress Notes (Addendum)
ANTICOAGULATION CONSULT NOTE - Follow Up Consult  Pharmacy Consult for Heparin Indication: pulmonary embolus and LLE DVT  No Known Allergies  Patient Measurements: Height: 5\' 9"  (175.3 cm) Weight: 67.5 kg (148 lb 12.8 oz) IBW/kg (Calculated) : 70.7 Heparin Dosing Weight: 67.5 kg  Vital Signs: Temp: 97.8 F (36.6 C) (11/18 0542) Temp Source: Oral (11/18 0542) BP: 143/83 (11/18 0542) Pulse Rate: 93 (11/18 0542)  Labs: Recent Labs    02/23/20 2232 02/24/20 0032 02/24/20 1223 02/24/20 2145 02/25/20 0400  HGB 12.1*  --   --   --  10.6*  HCT 40.1  --   --   --  33.5*  PLT 166  --   --   --  152  HEPARINUNFRC  --   --  0.27* 0.45 0.37  CREATININE 1.67*  --  1.36*  --  1.53*  TROPONINIHS 43* 45*  --   --   --     Estimated Creatinine Clearance: 32.5 mL/min (A) (by C-G formula based on SCr of 1.53 mg/dL (H)).  Assessment:  86 yr old male admitted with left-sided chest pain and SOB, CTA reveals PE w/ RHS.  Duplex shows LLE DVT.      Heparin level remains therapeutic (0.37) on 1250 units/hr.  Hgb trended down some, no bleeding reported. Platelet count 166>152K.    Per Urology, development of hematuria is expected in setting of new anticoagulation and obstructing ureteral stone. As long as patient is emptying bladder and Hgb is stable, no urologic intervention recommended as inpatient.  Plan outpt follow up when able to safely stop anticoagulation.    Goal of Therapy:  Heparin level 0.3-0.7 units/ml Monitor platelets by anticoagulation protocol: Yes   Plan:   Continue heparin drip at 1250 units/hr  Daily heparin level and CBC.  Monitor for signs/symptoms of bleeding.  Follow up oral anticoagulation plans.  97, Dennie Fetters Phone: [phone] 02/25/2020,8:22 AM   Addendum:   To transition to Eliquis.   > 82 yrs old and Scr > 1.5 today, but full-dosing indicated for PE/ DVT.   Eliquis 10 mg BID x 1 week, then 5 mg BID.   Heparin drip to stop when giving first  Eliquis dose.  02/27/2020, RPh 02/25/2020 1:34 PM

## 2020-02-26 LAB — BASIC METABOLIC PANEL
Anion gap: 10 (ref 5–15)
BUN: 24 mg/dL — ABNORMAL HIGH (ref 8–23)
CO2: 23 mmol/L (ref 22–32)
Calcium: 8.4 mg/dL — ABNORMAL LOW (ref 8.9–10.3)
Chloride: 107 mmol/L (ref 98–111)
Creatinine, Ser: 1.42 mg/dL — ABNORMAL HIGH (ref 0.61–1.24)
GFR, Estimated: 48 mL/min — ABNORMAL LOW (ref >60)
Glucose, Bld: 100 mg/dL — ABNORMAL HIGH (ref 70–99)
Potassium: 3.8 mmol/L (ref 3.5–5.1)
Sodium: 140 mmol/L (ref 135–145)

## 2020-02-26 LAB — CBC WITH DIFFERENTIAL/PLATELET
Abs Immature Granulocytes: 0.03 10*3/uL (ref 0.00–0.07)
Basophils Absolute: 0 10*3/uL (ref 0.0–0.1)
Basophils Relative: 0 %
Eosinophils Absolute: 0.2 10*3/uL (ref 0.0–0.5)
Eosinophils Relative: 3 %
HCT: 33.9 % — ABNORMAL LOW (ref 39.0–52.0)
Hemoglobin: 10.7 g/dL — ABNORMAL LOW (ref 13.0–17.0)
Immature Granulocytes: 0 %
Lymphocytes Relative: 14 %
Lymphs Abs: 1.3 10*3/uL (ref 0.7–4.0)
MCH: 27.4 pg (ref 26.0–34.0)
MCHC: 31.6 g/dL (ref 30.0–36.0)
MCV: 86.7 fL (ref 80.0–100.0)
Monocytes Absolute: 0.9 10*3/uL (ref 0.1–1.0)
Monocytes Relative: 10 %
Neutro Abs: 6.6 10*3/uL (ref 1.7–7.7)
Neutrophils Relative %: 73 %
Platelets: 166 10*3/uL (ref 150–400)
RBC: 3.91 MIL/uL — ABNORMAL LOW (ref 4.22–5.81)
RDW: 14.5 % (ref 11.5–15.5)
WBC: 9.1 10*3/uL (ref 4.0–10.5)
nRBC: 0 % (ref 0.0–0.2)

## 2020-02-26 MED ORDER — RIVAROXABAN 15 MG PO TABS
15.0000 mg | ORAL_TABLET | Freq: Two times a day (BID) | ORAL | Status: DC
  Administered 2020-02-26 – 2020-02-27 (×2): 15 mg via ORAL
  Filled 2020-02-26 (×2): qty 1

## 2020-02-26 MED ORDER — RIVAROXABAN 20 MG PO TABS: 20.0000 mg | ORAL_TABLET | Freq: Every day | ORAL | Status: DC

## 2020-02-26 MED ORDER — CEPHALEXIN 250 MG PO CAPS
250.0000 mg | ORAL_CAPSULE | Freq: Two times a day (BID) | ORAL | Status: DC
  Administered 2020-02-26 – 2020-02-27 (×3): 250 mg via ORAL
  Filled 2020-02-26 (×3): qty 1

## 2020-02-26 NOTE — Discharge Instructions (Signed)
Information on my medicine - XARELTO (rivaroxaban)  This medication education was reviewed with me or my healthcare representative as part of my discharge preparation.   WHY WAS XARELTO PRESCRIBED FOR YOU? Xarelto was prescribed to treat blood clots that may have been found in the veins of your legs (deep vein thrombosis) or in your lungs (pulmonary embolism) and to reduce the risk of them occurring again.  What do you need to know about Xarelto? The starting dose is one 15 mg tablet taken TWICE daily with food for the FIRST 21 DAYS then on 03/18/20  the dose is changed to one 20 mg tablet taken ONCE A DAY with your evening meal.  DO NOT stop taking Xarelto without talking to the health care provider who prescribed the medication.  Refill your prescription for 20 mg tablets before you run out.  After discharge, you should have regular check-up appointments with your healthcare provider that is prescribing your Xarelto.  In the future your dose may need to be changed if your kidney function changes by a significant amount.  What do you do if you miss a dose? If you are taking Xarelto TWICE DAILY and you miss a dose, take it as soon as you remember. You may take two 15 mg tablets (total 30 mg) at the same time then resume your regularly scheduled 15 mg twice daily the next day.  If you are taking Xarelto ONCE DAILY and you miss a dose, take it as soon as you remember on the same day then continue your regularly scheduled once daily regimen the next day. Do not take two doses of Xarelto at the same time.   Important Safety Information Xarelto is a blood thinner medicine that can cause bleeding. You should call your healthcare provider right away if you experience any of the following: ? Bleeding from an injury or your nose that does not stop. ? Unusual colored urine (red or dark brown) or unusual colored stools (red or black). ? Unusual bruising for unknown reasons. ? A serious fall or  if you hit your head (even if there is no bleeding).  Some medicines may interact with Xarelto and might increase your risk of bleeding while on Xarelto. To help avoid this, consult your healthcare provider or pharmacist prior to using any new prescription or non-prescription medications, including herbals, vitamins, non-steroidal anti-inflammatory drugs (NSAIDs) and supplements.  This website has more information on Xarelto: VisitDestination.com.br.

## 2020-02-26 NOTE — Evaluation (Signed)
Occupational Therapy Evaluation Patient Details Name: Corey Cummings MRN: [id number] DOB: 12/01/1930 Today's Date: 02/26/2020    History of Present Illness Pt is an 86 year old man admitted on 02/24/20 with pleuretic chest pain, dyspnea,  and mild L LE edema. + PE, UTI, AKI, bronchiectasis. PMH: HTN, BPH.    Clinical Impression   Pt lives alone, his wife recently died, and walks with a cane. He functions modified independently in ADL. Pt presents with generalized weakness and impaired standing balance. He is likely very near his baseline. He reports his family can assist him as needed at home. Pt requires set up to min guard assist for ADL with RW. Will follow acutely. Do not anticipate pt will need post acute OT.     Follow Up Recommendations  No OT follow up    Equipment Recommendations  None recommended by OT    Recommendations for Other Services       Precautions / Restrictions Precautions Precautions: Fall      Mobility Bed Mobility Overal bed mobility: Modified Independent             General bed mobility comments: HOB up    Transfers Overall transfer level: Needs assistance Equipment used: Rolling walker (2 wheeled) Transfers: Sit to/from Stand Sit to Stand: Min guard              Balance Overall balance assessment: Needs assistance   Sitting balance-Leahy Scale: Good       Standing balance-Leahy Scale: Fair                             ADL either performed or assessed with clinical judgement   ADL Overall ADL's : Needs assistance/impaired Eating/Feeding: Independent   Grooming: Min guard;Standing   Upper Body Bathing: Set up;Sitting   Lower Body Bathing: Min guard;Sit to/from stand   Upper Body Dressing : Set up;Sitting   Lower Body Dressing: Min guard;Sit to/from stand   Toilet Transfer: Min guard;Ambulation;RW   Toileting- Architect and Hygiene: Min guard;Sit to/from stand       Functional mobility during  ADLs: Min guard;Rolling walker       Vision Patient Visual Report: No change from baseline       Perception     Praxis      Pertinent Vitals/Pain Pain Assessment: No/denies pain     Hand Dominance Right   Extremity/Trunk Assessment Upper Extremity Assessment Upper Extremity Assessment: Overall WFL for tasks assessed   Lower Extremity Assessment Lower Extremity Assessment: Defer to PT evaluation   Cervical / Trunk Assessment Cervical / Trunk Assessment: Kyphotic   Communication Communication Communication: No difficulties   Cognition Arousal/Alertness: Awake/alert Behavior During Therapy: WFL for tasks assessed/performed Overall Cognitive Status: Within Functional Limits for tasks assessed                                     General Comments       Exercises     Shoulder Instructions      Home Living Family/patient expects to be discharged to:: Private residence Living Arrangements: Alone Available Help at Discharge: Family;Available PRN/intermittently Type of Home: House Home Access: Stairs to enter [employer] of Steps: 2   Home Layout: One level     Bathroom Shower/Tub: Chief Strategy Officer: Standard     Home Equipment: Bedside commode;Walker -  2 wheels;Grab bars - tub/shower;Cane - single point;Shower seat          Prior Functioning/Environment Level of Independence: Independent with assistive device(s)        Comments: walks with a cane, likes to mow grass        OT Problem List: Impaired balance (sitting and/or standing);Decreased knowledge of use of DME or AE      OT Treatment/Interventions: Self-care/ADL training;DME and/or AE instruction;Patient/family education;Balance training;Therapeutic activities    OT Goals(Current goals can be found in the care plan section) Acute Rehab OT Goals Patient Stated Goal: to go home OT Goal Formulation: With patient Time For Goal Achievement:  03/11/20 Potential to Achieve Goals: Good ADL Goals Pt Will Perform Grooming: with modified independence;standing Pt Will Perform Lower Body Bathing: with modified independence;sit to/from stand Pt Will Perform Lower Body Dressing: with modified independence;sit to/from stand Pt Will Transfer to Toilet: with modified independence;ambulating;bedside commode (over toilet) Pt Will Perform Toileting - Clothing Manipulation and hygiene: with modified independence;sit to/from stand Pt Will Perform Tub/Shower Transfer: with modified independence;ambulating;shower seat;Tub transfer Additional ADL Goal #1: Pt will gather items necessary for ADL around his room modified independently.  OT Frequency: Min 2X/week   Barriers to D/C:            Co-evaluation              AM-PAC OT "6 Clicks" Daily Activity     Outcome Measure Help from another person eating meals?: None Help from another person taking care of personal grooming?: A Little Help from another person toileting, which includes using toliet, bedpan, or urinal?: A Little Help from another person bathing (including washing, rinsing, drying)?: A Little Help from another person to put on and taking off regular upper body clothing?: None Help from another person to put on and taking off regular lower body clothing?: A Little 6 Click Score: 20   End of Session Equipment Utilized During Treatment: Rolling walker;Gait belt Nurse Communication: Mobility status  Activity Tolerance: Patient tolerated treatment well Patient left: in chair;with call bell/phone within reach  OT Visit Diagnosis: Unsteadiness on feet (R26.81);Other abnormalities of gait and mobility (R26.89)                Time: 7353-2992 OT Time Calculation (min): 14 min Charges:  OT General Charges $OT Visit: 1 Visit OT Evaluation $OT Eval Low Complexity: 1 Low  Martie Round, OTR/L Acute Rehabilitation Services Pager: [phone] Office: [phone]  Evern Bio 02/26/2020, 10:18 AM

## 2020-02-26 NOTE — Progress Notes (Signed)
PROGRESS NOTE    Kordell Jafri  WER:[id number] DOB: 09/20/1930 DOA: 02/23/2020 PCP: Julian Reil, PA    Brief Narrative:  Mr. Devine was admitted to the hospitalwith theworking diagnosis of acute pulmonary embolism.  86 year old male past medical history for hypertension and BPH who presents with pleuritic chest pain and dyspnea. Mild left lower extremity edema over the last week. 24 hours prior to admission mild left-sided pleuritic chest pain. Patient had COVID-19 vaccination 1 week ago. On his initial physical examination blood pressure 147/83, heart rate 84, respiratory 26, oxygen saturation 92%, his lungs were clear to auscultation bilaterally, heart S1-S2, present rhythmic, soft abdomen, no lower extremity edema.  Renal CT with horseshoe morphology of the kidneys with severe hydroureteronephrosis of the right kidney, large calculus positioned at the right ureterovesical junction measuring up to 15 mm in diameter. CT chest with acute moderate pulmonary embolism.    Assessment & Plan:   Principal Problem:   Acute pulmonary embolism (HCC) Active Problems:   Ureteral obstruction, right   UTI (urinary tract infection)   Renal insufficiency   Thoracic aortic aneurysm without rupture (HCC)   Bronchiectasis (HCC)   Pulmonary embolism (HCC)   1. Acute pulmonary embolism/ left popliteal DVT . Echocardiography with signs of RV strain with McConnell sign RV with moderate dilatation, moderate pulmonary hypertension.  Lower extremities [place] with age indeterminate deep vein thrombosis involving the left popliteal.   No dyspnea or bleeding, he is out of bed to chair and ambulated with PT/OT.   Continue hemodynamically stable, blood pressure has been 145 mmHg systolic.   He has been transitioned to oral antithrombotic therapy with apixaban with good toleration.   I checked with [facility] and co-pay for rivaroxaban will be  31.50, will change to rivaroxaban today.    2. Right  ureterovesical stone with right hydronephrosis. Urine infection (present on admission). wbc is 9,8, cultures no specific growth.   Transition from IV ceftriaxone to oral cephalexin.   3. AKI. renal function with serum cr at 1,42 with K at 3,8 and Na at 140,   Ct imaging with extreme cortical thinning on the right with numerous cystic appearing foci. Left with cortical thining.  Continue to follow up on renal function in am, avoid hypotension and nephrotoxic medications,.   4. Bronchiectasis. No signs of clinical exacerbation, continue with oxymetry monitoring. Continue with PRN bronchodilator therapy.   5. HTN. Holding antihypertensive medications to prevent hypotension.    Status is: Inpatient  Remains inpatient appropriate because:Inpatient level of care appropriate due to severity of illness   Dispo: The patient is from: Home              Anticipated d/c is to: Home              Anticipated d/c date is: 1 day              Patient currently is not medically stable to d/c.   DVT prophylaxis: rivaroxban   Code Status:   DNR   Family Communication:  I spoke with her daughter yesterday for update.       Subjective: Patient is out of bed to chair, no nausea or vomiting, no chest pain, no dyspnea. Ambulated with PT and OT.   Objective: Vitals:   02/25/20 1222 02/25/20 1932 02/26/20 0442 02/26/20 1112  BP: (!) 152/95 (!) 143/81 (!) 156/99 (!) 145/96  Pulse: 91 (!) 103 (!) 102   Resp:  18 18 17   Temp: 98.6  F (37 C) 98.2 F (36.8 C) 99 F (37.2 C) 98.3 F (36.8 C)  TempSrc: Oral Oral Oral Oral  SpO2:  96% 96% 99%  Weight:   66.8 kg   Height:        Intake/Output Summary (Last 24 hours) at 02/26/2020 1404 Last data filed at 02/26/2020 0830 Gross per 24 hour  Intake 483 ml  Output 900 ml  Net -417 ml   Filed Weights   02/24/20 1700 02/25/20 0530 02/26/20 0442  Weight: 67.6 kg 67.5 kg 66.8 kg    Examination:   General: Not in pain or dyspnea,    Neurology: Awake and alert, non focal  E ENT: no pallor, no icterus, oral mucosa moist Cardiovascular: No JVD. S1-S2 present, rhythmic, no gallops, rubs, or murmurs. No lower extremity edema. Pulmonary: vesicular breath sounds bilaterally,  Gastrointestinal. Abdomen soft and non tender Skin. No rashes Musculoskeletal: no joint deformities     Data Reviewed: I have personally reviewed following labs and imaging studies  CBC: Recent Labs  Lab 02/23/20 2232 02/25/20 0400 02/26/20 0055  WBC 9.9 8.2 9.1  NEUTROABS 7.8*  --  6.6  HGB 12.1* 10.6* 10.7*  HCT 40.1 33.5* 33.9*  MCV 89.7 87.9 86.7  PLT 166 152 166   Basic Metabolic Panel: Recent Labs  Lab 02/23/20 2232 02/24/20 0414 02/24/20 1223 02/25/20 0400 02/26/20 0055  NA 138  --  139 139 140  K 4.0  --  4.0 4.0 3.8  CL 104  --  108 108 107  CO2 23  --  22 22 23   GLUCOSE 172*  --  110* 98 100*  BUN 32* 28* 26* 27* 24*  CREATININE 1.67*  --  1.36* 1.53* 1.42*  CALCIUM 9.3  --  9.0 8.7* 8.4*   GFR: Estimated Creatinine Clearance: 34.6 mL/min (A) (by C-G formula based on SCr of 1.42 mg/dL (H)). Liver Function Tests: Recent Labs  Lab 02/23/20 2232  AST 15  ALT 11  ALKPHOS 53  BILITOT 0.8  PROT 6.7  ALBUMIN 2.9*   Recent Labs  Lab 02/23/20 2232  LIPASE 28   No results for input(s): AMMONIA in the last 168 hours. Coagulation Profile: No results for input(s): INR, PROTIME in the last 168 hours. Cardiac Enzymes: No results for input(s): CKTOTAL, CKMB, CKMBINDEX, TROPONINI in the last 168 hours. BNP (last 3 results) No results for input(s): PROBNP in the last 8760 hours. HbA1C: No results for input(s): HGBA1C in the last 72 hours. CBG: No results for input(s): GLUCAP in the last 168 hours. Lipid Profile: No results for input(s): CHOL, HDL, LDLCALC, TRIG, CHOLHDL, LDLDIRECT in the last 72 hours. Thyroid Function Tests: No results for input(s): TSH, T4TOTAL, FREET4, T3FREE, THYROIDAB in the last 72  hours. Anemia Panel: No results for input(s): VITAMINB12, FOLATE, FERRITIN, TIBC, IRON, RETICCTPCT in the last 72 hours.    Radiology Studies: I have reviewed all of the imaging during this hospital visit personally     Scheduled Meds: . apixaban  10 mg Oral BID   Followed by  . [START ON 03/03/2020] apixaban  5 mg Oral BID  . finasteride  5 mg Oral Daily  . sodium chloride flush  3 mL Intravenous Q12H   Continuous Infusions: . cefTRIAXone (ROCEPHIN)  IV 1 g (02/26/20 0500)     LOS: 2 days        Maryalyce Sanjuan 02/28/20, MD

## 2020-02-26 NOTE — Evaluation (Signed)
Physical Therapy Evaluation Patient Details Name: Shermon Bozzi MRN: [id number] DOB: 29-Sep-1930 Today's Date: 02/26/2020   History of Present Illness  Pt is an 86 year old man admitted on 02/24/20 with pleuretic chest pain, dyspnea,  and mild L LE edema. + PE, UTI, AKI, bronchiectasis. PMH: HTN, BPH.   Clinical Impression  Patient received in bed, agrees to PT assessment. Reports he would like to go home today. He is pleasant, cooperative. Performed bed mobility with mod independence. Sit to stand with min guard. He ambulated 150 feet with RW and min guard. No lob or significant difficulties, mild sob after walking. Reports he feels like he is near baseline, although normally uses cane. He will continue to benefit from skilled PT while here to improve strength and functional independence.    Follow Up Recommendations No PT follow up    Equipment Recommendations  None recommended by PT    Recommendations for Other Services       Precautions / Restrictions Precautions Precautions: Fall Restrictions Weight Bearing Restrictions: No      Mobility  Bed Mobility Overal bed mobility: Modified Independent             General bed mobility comments: HOB up    Transfers Overall transfer level: Needs assistance Equipment used: Rolling walker (2 wheeled) Transfers: Sit to/from Stand Sit to Stand: Min guard            Ambulation/Gait Ambulation/Gait assistance: Min guard Gait Distance (Feet): 150 Feet Assistive device: Rolling walker (2 wheeled) Gait Pattern/deviations: Step-through pattern Gait velocity: mild decr   General Gait Details: patient generally safe with RW. No LOB, mild SOB (due to mask per his report). O2 sats 94% upon returning to room.  Stairs            Wheelchair Mobility    Modified Rankin (Stroke Patients Only)       Balance Overall balance assessment: Needs assistance Sitting-balance support: Feet supported Sitting balance-Leahy Scale:  Good     Standing balance support: Bilateral upper extremity supported;During functional activity Standing balance-Leahy Scale: Fair Standing balance comment: benefits from RW at this time. min guard                             Pertinent Vitals/Pain Pain Assessment: No/denies pain    Home Living Family/patient expects to be discharged to:: Private residence Living Arrangements: Alone Available Help at Discharge: Family;Available PRN/intermittently Type of Home: House Home Access: Stairs to enter   Entrance Stairs-Number of Steps: 2 Home Layout: One level Home Equipment: Bedside commode;Walker - 2 wheels;Grab bars - tub/shower;Cane - single point;Shower seat      Prior Function Level of Independence: Independent with assistive device(s)         Comments: walks with a cane, likes to mow grass, daughter can help him as needed     Hand Dominance   Dominant Hand: Right    Extremity/Trunk Assessment   Upper Extremity Assessment Upper Extremity Assessment: Overall WFL for tasks assessed    Lower Extremity Assessment Lower Extremity Assessment: Overall WFL for tasks assessed    Cervical / Trunk Assessment Cervical / Trunk Assessment: Kyphotic  Communication   Communication: No difficulties  Cognition Arousal/Alertness: Awake/alert Behavior During Therapy: WFL for tasks assessed/performed Overall Cognitive Status: Within Functional Limits for tasks assessed  General Comments      Exercises     Assessment/Plan    PT Assessment Patient needs continued PT services  PT Problem List Decreased strength;Decreased activity tolerance;Decreased mobility       PT Treatment Interventions DME instruction;Therapeutic activities;Gait training;Therapeutic exercise;Functional mobility training;Stair training;Patient/family education    PT Goals (Current goals can be found in the Care Plan section)  Acute  Rehab PT Goals Patient Stated Goal: to go home today. PT Goal Formulation: With patient Time For Goal Achievement: 03/04/20 Potential to Achieve Goals: Good    Frequency Min 3X/week   Barriers to discharge Decreased caregiver support      Co-evaluation               AM-PAC PT "6 Clicks" Mobility  Outcome Measure Help needed turning from your back to your side while in a flat bed without using bedrails?: A Little Help needed moving from lying on your back to sitting on the side of a flat bed without using bedrails?: A Little Help needed moving to and from a bed to a chair (including a wheelchair)?: A Little Help needed standing up from a chair using your arms (e.g., wheelchair or bedside chair)?: A Little Help needed to walk in hospital room?: A Little Help needed climbing 3-5 steps with a railing? : A Little 6 Click Score: 18    End of Session Equipment Utilized During Treatment: Gait belt Activity Tolerance: Patient tolerated treatment well Patient left: in chair;with call bell/phone within reach;with nursing/sitter in room Nurse Communication: Mobility status PT Visit Diagnosis: Muscle weakness (generalized) (M62.81);Difficulty in walking, not elsewhere classified (R26.2)    Time: 3545-6256 PT Time Calculation (min) (ACUTE ONLY): 14 min   Charges:   PT Evaluation $PT Eval Moderate Complexity: 1 Mod PT Treatments $Gait Training: 8-22 mins        Tanesha Arambula, PT, GCS 02/26/20,11:26 AM

## 2020-02-26 NOTE — Progress Notes (Signed)
ANTICOAGULATION CONSULT NOTE - Follow Up Consult  Pharmacy Consult for Heparin -> Rivaroxaban Indication: pulmonary embolus and LLE DVT  No Known Allergies  Patient Measurements: Height: 5\' 9"  (175.3 cm) Weight: 66.8 kg (147 lb 3.2 oz) (scale b) IBW/kg (Calculated) : 70.7 Heparin Dosing Weight: 67.5 kg  Vital Signs: Temp: 98.3 F (36.8 C) (11/19 1112) Temp Source: Oral (11/19 1112) BP: 145/96 (11/19 1112) Pulse Rate: 102 (11/19 0442)  Labs: Recent Labs    02/23/20 2232 02/23/20 2232 02/24/20 0032 02/24/20 1223 02/24/20 2145 02/25/20 0400 02/26/20 0055  HGB 12.1*   < >  --   --   --  10.6* 10.7*  HCT 40.1  --   --   --   --  33.5* 33.9*  PLT 166  --   --   --   --  152 166  HEPARINUNFRC  --   --   --  0.27* 0.45 0.37  --   CREATININE 1.67*   < >  --  1.36*  --  1.53* 1.42*  TROPONINIHS 43*  --  45*  --   --   --   --    < > = values in this interval not displayed.    Estimated Creatinine Clearance: 34.6 mL/min (A) (by C-G formula based on SCr of 1.42 mg/dL (H)).  Assessment:  86 yr old male admitted with left-sided chest pain and SOB, CTA reveals PE w/ RHS.  Duplex shows LLE DVT.    Per Urology, development of hematuria is expected in setting of new anticoagulation and obstructing ureteral stone. As long as patient is emptying bladder and Hgb is stable, no urologic intervention recommended as inpatient.  Plan outpt follow up when able to safely stop anticoagulation.  Pharmacy consulted to switch to rivaroxaban.  Goal of Therapy:  Heparin level 0.3-0.7 units/ml Monitor platelets by anticoagulation protocol: Yes   Plan:  Due to a lower copay , will transition from Eliquis to Xarelto.  D/c Eliquis Xarelto 15 mg BID x 21 days, then 20 mg daily.  97, PharmD, [facility] Clinical Pharmacist Please see AMION for all Pharmacists' Contact Phone Numbers 02/26/2020, 2:52 PM

## 2020-02-26 NOTE — TOC Transition Note (Addendum)
Transition of Care [facility]) - CM/SW Discharge Note   Patient Details  Name: Magic Mohler MRN: [id number] Date of Birth: Feb 19, 1931  [facility] of [facility]) CM/SW Contact:  Leone Haven, RN Phone Number: 02/26/2020, 5:18 PM   Clinical Narrative:    Patient states he lives alone, he states his daughter will transport him home at discharge.  Per MD notes they will change from eliquis to xarelto. NCM gave patient the 30 day free xarelto coupon. NCM informed patient of his copay amount of 31.00 for refills per MD note.   Final next level of care: Home/Self Care Barriers to Discharge: Continued Medical Work up   Patient Goals and CMS Choice     Choice offered to / list presented to : NA  Discharge Placement                       Discharge Plan and Services                  DME Agency: NA       HH Arranged: NA          Social Determinants of Health (SDOH) Interventions     Readmission Risk Interventions No flowsheet data found.

## 2020-02-27 DIAGNOSIS — I82432 Acute embolism and thrombosis of left popliteal vein: Secondary | ICD-10-CM

## 2020-02-27 LAB — CBC WITH DIFFERENTIAL/PLATELET
Abs Immature Granulocytes: 0.03 10*3/uL (ref 0.00–0.07)
Basophils Absolute: 0 10*3/uL (ref 0.0–0.1)
Basophils Relative: 0 %
Eosinophils Absolute: 0.2 10*3/uL (ref 0.0–0.5)
Eosinophils Relative: 2 %
HCT: 35 % — ABNORMAL LOW (ref 39.0–52.0)
Hemoglobin: 11.2 g/dL — ABNORMAL LOW (ref 13.0–17.0)
Immature Granulocytes: 0 %
Lymphocytes Relative: 12 %
Lymphs Abs: 1.1 10*3/uL (ref 0.7–4.0)
MCH: 27.7 pg (ref 26.0–34.0)
MCHC: 32 g/dL (ref 30.0–36.0)
MCV: 86.4 fL (ref 80.0–100.0)
Monocytes Absolute: 1.2 10*3/uL — ABNORMAL HIGH (ref 0.1–1.0)
Monocytes Relative: 13 %
Neutro Abs: 6.8 10*3/uL (ref 1.7–7.7)
Neutrophils Relative %: 73 %
Platelets: 197 10*3/uL (ref 150–400)
RBC: 4.05 MIL/uL — ABNORMAL LOW (ref 4.22–5.81)
RDW: 14.4 % (ref 11.5–15.5)
WBC: 9.4 10*3/uL (ref 4.0–10.5)
nRBC: 0 % (ref 0.0–0.2)

## 2020-02-27 LAB — BASIC METABOLIC PANEL
Anion gap: 10 (ref 5–15)
BUN: 23 mg/dL (ref 8–23)
CO2: 23 mmol/L (ref 22–32)
Calcium: 8.4 mg/dL — ABNORMAL LOW (ref 8.9–10.3)
Chloride: 105 mmol/L (ref 98–111)
Creatinine, Ser: 1.43 mg/dL — ABNORMAL HIGH (ref 0.61–1.24)
GFR, Estimated: 47 mL/min — ABNORMAL LOW (ref >60)
Glucose, Bld: 128 mg/dL — ABNORMAL HIGH (ref 70–99)
Potassium: 4 mmol/L (ref 3.5–5.1)
Sodium: 138 mmol/L (ref 135–145)

## 2020-02-27 MED ORDER — CEPHALEXIN 250 MG PO CAPS: 250.0000 mg | ORAL_CAPSULE | Freq: Two times a day (BID) | ORAL | 0 refills | Status: AC

## 2020-02-27 MED ORDER — RIVAROXABAN (XARELTO) VTE STARTER PACK (15 & 20 MG): ORAL_TABLET | ORAL | 0 refills | Status: DC

## 2020-02-27 NOTE — Discharge Summary (Addendum)
Physician Discharge Summary  Corey Cummings ZOX:[id number] DOB: [birth year] DOA: 02/23/2020  PCP: Corey ReilWalter, Corey H, PA  Admit date: 02/23/2020 Discharge date: 02/27/2020  Admitted From: Home  Disposition:  Home   Recommendations for Outpatient Follow-up and new medication changes:  1. Follow up with Corey CoilSharon Walter PA in 7 days.  2. Patient placed on rivaroxaban for anticoagulation.  3. Holding in HCTZ due to risk of hypotension  4. Continue cephalexin for 7 more days.   I spoke over the phone with the patient's daughter about patient's  condition, plan of care, prognosis and all questions were addressed.  Home Health: no   Equipment/Devices: no    Discharge Condition: stable  CODE STATUS: DNR   Diet recommendation: heart healthy   Brief/Interim Summary: Corey Cummings was admitted to the hospitalwith theworking diagnosis of acute pulmonary embolism.  86 year old male past medical history for hypertension and BPH who presents with pleuritic chest pain and dyspnea. Mild left lower extremity edema over the last week. 24 hours prior to admission mild left-sided pleuritic chest pain. Patient had COVID-19 vaccination 1 week ago. Recently loss his wife.On his initial physical examination blood pressure 147/83, heart rate 84, respiratory rate 26, oxygen saturation 92%, his lungs were clear to auscultation bilaterally, heart S1-S2, present rhythmic, soft abdomen, no lower extremity edema. Sodium 138, potassium 4.0, chloride 104, bicarb 23, glucose 172, BUN 32, creatinine 1.67, troponin I 43, white count 9.9, hemoglobin 12.1, hematocrit 40.1, platelets 166.  SARS COVID-19 negative.  Urinalysis specific gravity 1.013, 30 protein, 11-20 red cells, more than 50 white cells. Chest radiograph with no infiltrates. EKG 102 bpm, normal axis, normal intervals, sinus rhythm, no ST segment or T wave changes.  Renal CT with horseshoe morphology of the kidneys with severe hydroureteronephrosis of the right  kidney, large calculus positioned at the right ureterovesical junction measuring up to 15 mm in diameter and over 33 mm in length. CT chest with acute moderate pulmonary embolism.  1.  Acute pulmonary embolism, left popliteal deep vein thrombosis.  Patient was admitted to the telemetry ward, he received anticoagulation with IV heparin with good toleration. Further work-up with echocardiography showed positive signs of RV strain, McConnell sign, moderate RV dilatation, moderate pulmonary hypertension.  Ultrasonography of the lower extremities was positive for age indeterminate deep vein thrombosis involving the left popliteal vein.  Patient remained hemodynamically stable, he was successfully transitioned to rivaroxaban with good toleration.  PT/OT inpatient evaluation, recommended no follow up.   2.  Right ureterovesical stone with right hydronephrosis, complicated with urinary tract infection, present on admission.  Patient was evaluated by urology, concluded that the obstruction is likely chronic, recommendations to empirically treat urinary tract infection for 10 to 14 days.  Patient did not develop hematuria.  He will follow-up with urology as an outpatient.  3.  Acute kidney injury on suspected chronic kidney disease, stage 3a.  Patient received supportive medical therapy including intravenous fluids, his discharge creatinine is 1.43, sodium 138, potassium 4.0 and bicarb 23.  His CT imaging showing extreme cortical thinning on the right with numerous cystic-appearing foci, left with cortical thinning, findings suggesting chronic kidney disease.  4.  Bronchiectasis.  No signs of acute exacerbation, continue bronchodilator therapy.  5.  Hypertension.  His blood pressure remained well controlled during his hospitalization, antihypertensive agents were held.  At discharge will resume olmesartan but will continue holding hydrochlorothiazide to avoid hypotension.  6. BPH. Continue with  finesteride.   Discharge Diagnoses:  Principal Problem:  Acute pulmonary embolism (HCC) Active Problems:   Ureteral obstruction, right   UTI (urinary tract infection)   Renal insufficiency   Thoracic aortic aneurysm without rupture (HCC)   Bronchiectasis (HCC)   Pulmonary embolism (HCC)    Discharge Instructions   Allergies as of 02/27/2020   No Known Allergies     Medication List    STOP taking these medications   hydrochlorothiazide 12.5 MG capsule Commonly known as: MICROZIDE     TAKE these medications   cephALEXin 250 MG capsule Commonly known as: KEFLEX Take 1 capsule (250 mg total) by mouth every 12 (twelve) hours for 7 days.   finasteride 5 MG tablet Commonly known as: PROSCAR Take 5 mg by mouth daily.   olmesartan 20 MG tablet Commonly known as: BENICAR Take 20 mg by mouth daily.   Rivaroxaban Stater Pack (15 mg and 20 mg) Commonly known as: XARELTO STARTER PACK Follow package directions: Take one 15mg  tablet by mouth twice a day. On day 22, switch to one 20mg  tablet once a day. Take with food.       Follow-up Information    Corey Reil, PA Follow up in 1 week(s).   Specialty: Neurology             No Known Allergies  Consultations:  Urology    Procedures/Studies: CT Angio Chest PE W and/or Wo Contrast  Addendum Date: 02/24/2020   ADDENDUM REPORT: 02/24/2020 03:35 ADDENDUM: These results were called by telephone at the time of interpretation on 02/24/2020 at 3:35 am to provider [facility] , who verbally acknowledged these results. Electronically Signed   By: Helyn Numbers MD   On: 02/24/2020 03:35   Result Date: 02/24/2020 CLINICAL DATA:  Left chest pain, dyspnea EXAM: CT ANGIOGRAPHY CHEST WITH CONTRAST TECHNIQUE: Multidetector CT imaging of the chest was performed using the standard protocol during bolus administration of intravenous contrast. Multiplanar CT image reconstructions and MIPs were obtained to evaluate the vascular  anatomy. CONTRAST:  80mL OMNIPAQUE IOHEXOL 350 MG/ML SOLN COMPARISON:  None. FINDINGS: Cardiovascular: There is excellent opacification of the pulmonary arterial tree. There are multiple branching intraluminal filling defects identified within the segmental pulmonary arteries of the left upper lobe, lingula, left lower lobe, and right lower lobe compatible with acute pulmonary embolism. The embolic burden is moderate. The central pulmonary arteries are of normal caliber. There is relative enlargement of the right ventricle in relation to the left compatible with CT evidence of right heart strain. There is reflux of contrast into the hepatic venous system in keeping with at least some degree of right heart failure. Mild coronary artery calcification. Global cardiac size within normal limits. No pericardial effusion. The thoracic aorta is dilated in its descending segment at the aortic arch measuring 3.3 cm in greatest dimension. This vessel measures 3.1 cm in dimension at the level of the left atrium. Mild atherosclerotic calcification within the thoracic aorta. Mediastinum/Nodes: There is asymmetric enlargement of the left thyroid lobe with minimal mass effect upon the airway. No underlying nodularity. No pathologic thoracic adenopathy. The esophagus is unremarkable. Lungs/Pleura: There is widespread bronchial Lund thickening in keeping with airway inflammation. There is mild associated bronchiectasis within the right upper lobe with peribronchial nodularity and peribronchial thickening. Together, the findings are suspicious for changes of atypical infection including mycobacterium avium intracellulare or fungal infection. Milder changes are noted within the right lower lobe. There is more focal consolidation within the posterior basal left lower lobe and basilar lingula which may  reflect hemorrhagic infiltrate in the setting of a pulmonary infarct. Tiny left pleural effusion is present. No pneumothorax. No central  obstructing lesion. Upper Abdomen: Cystic lesions are incompletely evaluated at the visualized upper pole of the kidneys bilaterally. No acute abnormality within the abdomen. Musculoskeletal: No acute bone abnormality. Review of the MIP images confirms the above findings. IMPRESSION: Acute, moderate pulmonary embolism. Enlargement of the right ventricle with inversion of the normal RV/LV ratio in keeping with CT evidence of right heart strain. Reflux of contrast into the hepatic venous system compatible with at least some degree of right heart failure. Bronchial Ceballos thickening, peribronchial nodularity, bronchiectasis predominantly within the right upper lobe in keeping with changes of atypical infection as can be seen with mycobacterium avium intracellulare. Milder changes noted within the right lower lobe. Diffuse bronchial Alfieri thickening in keeping with airway inflammation. Subtle focal infiltrate within the posterior basal left lower lobe and basilar lingula possibly representing developing pulmonary infarct. Trace associated left pleural effusion. Dilation of the thoracic aortic arch measuring 3.3 cm in maximal dimension. Recommend annual imaging followup by CTA or MRA. This recommendation follows 2010 ACCF/AHA/AATS/ACR/ASA/SCA/SCAI/SIR/STS/SVM Guidelines for the Diagnosis and Management of Patients with Thoracic Aortic Disease. Circulation.2010; 121: Z610-R604. Aortic aneurysm NOS (ICD10-I71.9) Attempts are being made at this time to contact the managing physician for direct communication. Aortic Atherosclerosis (ICD10-I70.0). Electronically Signed: By: Helyn Numbers MD On: 02/24/2020 03:08   DG Chest Portable 1 View  Result Date: 02/23/2020 CLINICAL DATA:  Chest pain EXAM: PORTABLE CHEST 1 VIEW COMPARISON:  None. FINDINGS: The heart size and mediastinal contours are within normal limits. Aortic knob calcifications are seen. Both lungs are clear. The visualized skeletal structures are unremarkable.  IMPRESSION: No active disease. Electronically Signed   By: Jonna Clark M.D.   On: 02/23/2020 22:54   ECHOCARDIOGRAM COMPLETE  Result Date: 02/24/2020    ECHOCARDIOGRAM REPORT   Patient Name:   Corey Cummings Date of Exam: 02/24/2020 Medical Rec #:  [id number]   Height:       69.0 in Accession #:    [id number]  Weight:       164.0 lb Date of Birth:  May 18, 1930   BSA:          1.899 m Patient Age:    86 years    BP:           136/90 mmHg Patient Gender: M           HR:           86 bpm. Exam Location:  Inpatient Procedure: 2D Echo, Cardiac Doppler and Color Doppler Indications:    I26.02 Pulmonary embolus  History:        Patient has no prior history of Echocardiogram examinations.                 Risk Factors:Former Smoker. Thoracic aortic aneurysm.  Sonographer:    Sheralyn Boatman RDCS Referring Phys: [id number] TIMOTHY S OPYD  Sonographer Comments: Technically difficult study due to poor echo windows and suboptimal subcostal window. Images difficult due to spinal curvature. Patient could not lie back. IMPRESSIONS  1. Left ventricular ejection fraction, by estimation, is 55 to 60%. The left ventricle has normal function. The left ventricle has no regional Eaddy motion abnormalities. Left ventricular diastolic parameters are indeterminate.  2. Hyperdynamic Base with McConnell's Sign. PE or acute RV dysfunction is on the differential diagnosis. Right ventricular systolic function is hyperdynamic. The right ventricular size is moderately enlarged. There is  moderately elevated pulmonary artery systolic pressure. The estimated right ventricular systolic pressure is 58.3 mmHg.  3. The mitral valve is grossly normal. No evidence of mitral valve regurgitation.  4. Tricuspid valve regurgitation is moderate.  5. The aortic valve is calcified. There is moderate calcification of the aortic valve. Aortic valve regurgitation is not visualized.  6. The inferior vena cava is dilated in size with <50% respiratory variability, suggesting  right atrial pressure of 15 mmHg. Comparison(s): No prior Echocardiogram. FINDINGS  Left Ventricle: Left ventricular ejection fraction, by estimation, is 55 to 60%. The left ventricle has normal function. The left ventricle has no regional Lusher motion abnormalities. The left ventricular internal cavity size was normal in size. There is  no left ventricular hypertrophy. Left ventricular diastolic parameters are indeterminate. Right Ventricle: Hyperdynamic Base with McConnell's Sign. PE or acute RV dysfunction is on the differential diagnosis. The right ventricular size is moderately enlarged. No increase in right ventricular Zafar thickness. Right ventricular systolic function  is hyperdynamic. There is moderately elevated pulmonary artery systolic pressure. The tricuspid regurgitant velocity is 3.29 m/s, and with an assumed right atrial pressure of 15 mmHg, the estimated right ventricular systolic pressure is 58.3 mmHg. Left Atrium: Left atrial size was normal in size. Right Atrium: Right atrial size was normal in size. Pericardium: There is no evidence of pericardial effusion. Mitral Valve: The mitral valve is grossly normal. No evidence of mitral valve regurgitation. Tricuspid Valve: The tricuspid valve is normal in structure. Tricuspid valve regurgitation is moderate. Aortic Valve: The aortic valve is calcified. There is moderate calcification of the aortic valve. Aortic valve regurgitation is not visualized. Pulmonic Valve: The pulmonic valve was not well visualized. Pulmonic valve regurgitation is not visualized. Aorta: The aortic root is normal in size and structure and the ascending aorta was not well visualized. Venous: The inferior vena cava is dilated in size with less than 50% respiratory variability, suggesting right atrial pressure of 15 mmHg. IAS/Shunts: The atrial septum is grossly normal.  LEFT VENTRICLE PLAX 2D LVIDd:         4.80 cm     Diastology LVIDs:         3.50 cm     LV e' medial:    5.00  cm/s LV PW:         1.20 cm     LV E/e' medial:  15.5 LV IVS:        0.90 cm     LV e' lateral:   5.44 cm/s LVOT diam:     2.30 cm     LV E/e' lateral: 14.2 LV SV:         65 LV SV Index:   34 LVOT Area:     4.15 cm  LV Volumes (MOD) LV vol d, MOD A2C: 46.8 ml LV vol d, MOD A4C: 61.1 ml LV vol s, MOD A2C: 21.3 ml LV vol s, MOD A4C: 27.7 ml LV SV MOD A2C:     25.5 ml LV SV MOD A4C:     61.1 ml LV SV MOD BP:      29.2 ml RIGHT VENTRICLE             IVC RV S prime:     10.90 cm/s  IVC diam: 2.50 cm TAPSE (M-mode): 2.3 cm LEFT ATRIUM             Index       RIGHT ATRIUM  Index LA diam:        3.50 cm 1.84 cm/m  RA Area:     14.60 cm LA Vol (A2C):   27.2 ml 14.32 ml/m RA Volume:   33.50 ml  17.64 ml/m LA Vol (A4C):   33.4 ml 17.59 ml/m LA Biplane Vol: 31.0 ml 16.33 ml/m  AORTIC VALVE LVOT Vmax:   86.70 cm/s LVOT Vmean:  55.100 cm/s LVOT VTI:    0.156 m  AORTA Ao Root diam: 3.50 cm MITRAL VALVE               TRICUSPID VALVE MV Area (PHT): 4.09 cm    TR Peak grad:   43.3 mmHg MV Decel Time: 185 msec    TR Vmax:        329.00 cm/s MV E velocity: 77.40 cm/s MV A velocity: 85.30 cm/s  SHUNTS MV E/A ratio:  0.91        Systemic VTI:  0.16 m                            Systemic Diam: 2.30 cm Riley Lam MD Electronically signed by Riley Lam MD Signature Date/Time: 02/24/2020/1:59:49 PM    Final    CT Renal Stone Study  Result Date: 02/24/2020 CLINICAL DATA:  Left rib, flank pain EXAM: CT ABDOMEN AND PELVIS WITHOUT CONTRAST TECHNIQUE: Multidetector CT imaging of the abdomen and pelvis was performed following the standard protocol without IV contrast. COMPARISON:  None. FINDINGS: Lower chest: Small left pleural effusion with some adjacent ground-glass, favoring mixed passive and dependent atelectasis. Additional scarring and architectural distortion seen the lingula. Cardiac size is top. No pericardial effusion. Aortic leaflet calcifications. Coronary artery atherosclerosis is noted as  well. Hepatobiliary: No discernible focal liver lesion within the limitations of this unenhanced CT. Smooth liver surface contour. Normal liver attenuation. Gallbladder partially decompressed. No pericholecystic fluid or inflammation. No visible calcified gallstone or biliary ductal dilatation. Pancreas: No pancreatic ductal dilatation or surrounding inflammatory changes. Spleen: Normal in size. No concerning splenic lesions. Adrenals/Urinary Tract: Normal adrenal glands. Horseshoe morphology of the kidneys with a thin parenchymal band extending posterior to the IMA. Extreme hydroureteronephrosis of the right renal moiety to the level of a large calculus positioned at the right ureterovesicular junction measuring up to 15 mm in diameter and over 33 mm in length partially protruding through the ureterovesicular orifice. Some associated stranding is noted along the right ureter as well. There is significant cortical thinning on the right as well with numerous cystic appearing foci, some of which could reflect dilated calices though are incompletely assessed on this unenhanced CT. Several of these demonstrate some peripheral thin calcifications which could reflect additional nonobstructing calculi or mural calcifications of cysts. More mild-to-moderate cortical thinning is seen in the left kidney with several fluid attenuation cortical and parapelvic renal cysts. A lobular, distended appearance of the left renal pelvis is seen with abrupt transition to a more normal caliber distal ureter at the level of the ureteropelvic junction, possibly reflecting a chronic UPJ obstruction though comparison imaging is unavailable. No left urinary tract calculi are seen. Circumferential thickening of the bladder with multiple diverticular outpouchings and indentation of the bladder base by the enlarged prostate. Stomach/Bowel: Distal esophagus, stomach and duodenal sweep are unremarkable. No small bowel Hannibal thickening or dilatation.  No evidence of obstruction. Appendix is not visualized. No focal inflammation the vicinity of the cecum to suggest an occult appendicitis. No colonic dilatation  or Smucker thickening. Vascular/Lymphatic: Atherosclerotic calcifications within the abdominal aorta and branch vessels. No aneurysm or ectasia. No enlarged abdominopelvic lymph nodes. Reproductive: Prostatomegaly with indentation of bladder base as above. Coarse eccentric calcification of the prostate. No other significant or concerning abnormalities of the prostate or seminal vesicles. Other: No abdominopelvic free fluid or free gas. No bowel containing hernias. Fat containing left inguinal hernia. Musculoskeletal: Likely remote anterior compression deformity at L1 with up to 40% height loss. No other vertebral body height loss or compression deformity is seen. No acute fracture or traumatic osseous abnormality. Multilevel degenerative changes are present in the spine, hips and pelvis. No suspicious lytic or blastic lesions. IMPRESSION: 1. Horseshoe morphology of the kidneys with severe hydroureteronephrosis of the right renal moiety to the level of a large calculus positioned at the right ureterovesicular junction measuring up to 15 mm in diameter and over 33 mm in length partially protruding through the ureterovesicular junction. 2. There is extreme cortical thinning on the right as well with numerous cystic appearing foci, some of which may reflect dilated calices though are incompletely assessed on this unenhanced CT. Several of these demonstrate some peripheral thin calcifications which could reflect additional nonobstructing calculi or mural calcifications of cysts. Could consider further characterization with renal MRI. 3. More mild to moderate cortical thinning of the left kidney with a lobular, distended appearance of the left renal pelvis with abrupt transition to a more normal caliber ureter at the level of the ureteropelvic junction, possibly  reflecting a chronic UPJ obstruction though comparison imaging is unavailable. 4. Prostatomegaly with indentation of the bladder base by the enlarged prostate. Circumferential thickening of the bladder with multiple diverticular outpouchings and indentation of the bladder base by the enlarged prostate. While findings could reflect sequela of chronic outlet obstruction given some perivesicular hazy stranding as well as nonspecific stranding along the distended right ureter, should assess with urinalysis to exclude a superimposed ascending tract infection. 5. Small left pleural effusion with some adjacent ground-glass, favoring mixed passive and dependent atelectasis. 6. Aortic Atherosclerosis (ICD10-I70.0). Electronically Signed   By: Kreg Shropshire M.D.   On: 02/24/2020 03:11   VAS [place] LOWER EXTREMITY VENOUS (DVT)  Result Date: 02/24/2020  Lower Venous DVT Study Indications: Swelling, and pulmonary embolism.  Comparison Study: no prior Performing Technologist: Blanch Media RVS  Examination Guidelines: A complete evaluation includes B-mode imaging, spectral Doppler, color Doppler, and power Doppler as needed of all accessible portions of each vessel. Bilateral testing is considered an integral part of a complete examination. Limited examinations for reoccurring indications may be performed as noted. The reflux portion of the exam is performed with the patient in reverse Trendelenburg.  +-----+---------------+---------+-----------+----------+--------------+ RIGHTCompressibilityPhasicitySpontaneityPropertiesThrombus Aging +-----+---------------+---------+-----------+----------+--------------+ CFV  Full           Yes      Yes                                 +-----+---------------+---------+-----------+----------+--------------+   +---------+---------------+---------+-----------+----------+-----------------+ LEFT     CompressibilityPhasicitySpontaneityPropertiesThrombus Aging     +---------+---------------+---------+-----------+----------+-----------------+ CFV      Full           Yes      Yes                                    +---------+---------------+---------+-----------+----------+-----------------+ SFJ      Full                                                           +---------+---------------+---------+-----------+----------+-----------------+  FV Prox  Full                                                           +---------+---------------+---------+-----------+----------+-----------------+ FV Mid   Full                                                           +---------+---------------+---------+-----------+----------+-----------------+ FV DistalFull                                                           +---------+---------------+---------+-----------+----------+-----------------+ PFV      Full                                                           +---------+---------------+---------+-----------+----------+-----------------+ POP      None           Yes      Yes                  Age Indeterminate +---------+---------------+---------+-----------+----------+-----------------+ PTV      Full                                                           +---------+---------------+---------+-----------+----------+-----------------+ PERO     Full                                                           +---------+---------------+---------+-----------+----------+-----------------+     Summary: RIGHT: - No evidence of common femoral vein obstruction.  LEFT: - Findings consistent with age indeterminate deep vein thrombosis involving the left popliteal vein. - No cystic structure found in the popliteal fossa.  *See table(s) above for measurements and observations. Electronically signed by Coral Else MD on 02/24/2020 at 8:19:17 PM.    Final         Subjective: Patient is feeling better, no dyspnea or chest pain,  no nausea or vomiting, has been out of bed with good toleration.   Discharge Exam: Vitals:   02/27/20 0330 02/27/20 0759  BP: 121/68 137/83  Pulse: 72   Resp: (!) 21 (!) 22  Temp: 98.2 F (36.8 C) 98 F (36.7 C)  SpO2: 97%    Vitals:   02/26/20 2300 02/26/20 2330 02/27/20 0330 02/27/20 0759  BP: 129/84  121/68 137/83  Pulse:   72   Resp: (!) 27 20 (!) 21 (!) 22  Temp: 97.9 F (36.6  C)  98.2 F (36.8 C) 98 F (36.7 C)  TempSrc: Oral  Oral Oral  SpO2: 98%  97%   Weight:   68 kg   Height:        General: Not in pain or dyspnea.  Neurology: Awake and alert, non focal  E ENT: no pallor, no icterus, oral mucosa moist Cardiovascular: No JVD. S1-S2 present, rhythmic, no gallops, rubs, or murmurs. Trace left lower extremity edema. Pulmonary: positive breath sounds bilaterally,with no wheezing, rhonchi or rales. Gastrointestinal. Abdomen soft and non tender.  Skin. No rashes Musculoskeletal: no joint deformities   The results of significant diagnostics from this hospitalization (including imaging, microbiology, ancillary and laboratory) are listed below for reference.     Microbiology: Recent Results (from the past 240 hour(s))  Respiratory Panel by RT PCR (Flu A&B, Covid) - Nasopharyngeal Swab     Status: None   Collection Time: 02/24/20 12:36 AM   Specimen: Nasopharyngeal Swab  Result Value Ref Range Status   SARS Coronavirus 2 by RT PCR NEGATIVE NEGATIVE Final    Comment: (NOTE) SARS-CoV-2 target nucleic acids are NOT DETECTED.  The SARS-CoV-2 RNA is generally detectable in upper respiratoy specimens during the acute phase of infection. The lowest concentration of SARS-CoV-2 viral copies this assay can detect is 131 copies/mL. A negative result does not preclude SARS-Cov-2 infection and should not be used as the sole basis for treatment or other patient management decisions. A negative result may occur with  improper specimen collection/handling, submission of  specimen other than nasopharyngeal swab, presence of viral mutation(s) within the areas targeted by this assay, and inadequate number of viral copies (<131 copies/mL). A negative result must be combined with clinical observations, patient history, and epidemiological information. The expected result is Negative.  Fact Sheet for Patients:  https://www.moore.com/  Fact Sheet for Healthcare Providers:  https://www.young.biz/  This test is no t yet approved or cleared by the [place] FDA and  has been authorized for detection and/or diagnosis of SARS-CoV-2 by FDA under an Emergency Use Authorization (EUA). This EUA will remain  in effect (meaning this test can be used) for the duration of the COVID-19 declaration under Section 564(b)(1) of the Act, 21 U.S.C. section 360bbb-3(b)(1), unless the authorization is terminated or revoked sooner.     Influenza A by PCR NEGATIVE NEGATIVE Final   Influenza B by PCR NEGATIVE NEGATIVE Final    Comment: (NOTE) The Xpert Xpress SARS-CoV-2/FLU/RSV assay is intended as an aid in  the diagnosis of influenza from Nasopharyngeal swab specimens and  should not be used as a sole basis for treatment. Nasal washings and  aspirates are unacceptable for Xpert Xpress SARS-CoV-2/FLU/RSV  testing.  Fact Sheet for Patients: https://www.moore.com/  Fact Sheet for Healthcare Providers: https://www.young.biz/  This test is not yet approved or cleared by the [place] FDA and  has been authorized for detection and/or diagnosis of SARS-CoV-2 by  FDA under an Emergency Use Authorization (EUA). This EUA will remain  in effect (meaning this test can be used) for the duration of the  Covid-19 declaration under Section 564(b)(1) of the Act, 21  U.S.C. section 360bbb-3(b)(1), unless the authorization is  terminated or revoked. Performed at [facility], [address]  [place], [place] [address]   Urine culture     Status: Abnormal   Collection Time: 02/24/20  3:17 AM   Specimen: Urine, Random  Result Value Ref Range Status   Specimen Description URINE, RANDOM  Final  Special Requests   Final    NONE Performed at [facility], [address] [place], [place] [address]    Culture MULTIPLE SPECIES PRESENT, SUGGEST RECOLLECTION (A)  Final   Report Status 02/25/2020 FINAL  Final     Labs: BNP (last 3 results) Recent Labs    02/24/20 1223  BNP 285.5*   Basic Metabolic Panel: Recent Labs  Lab 02/23/20 2232 02/23/20 2232 02/24/20 0414 02/24/20 1223 02/25/20 0400 02/26/20 0055 02/27/20 0411  NA 138  --   --  139 139 140 138  K 4.0  --   --  4.0 4.0 3.8 4.0  CL 104  --   --  108 108 107 105  CO2 23  --   --  22 22 23 23   GLUCOSE 172*  --   --  110* 98 100* 128*  BUN 32*   < > 28* 26* 27* 24* 23  CREATININE 1.67*  --   --  1.36* 1.53* 1.42* 1.43*  CALCIUM 9.3  --   --  9.0 8.7* 8.4* 8.4*   < > = values in this interval not displayed.   Liver Function Tests: Recent Labs  Lab 02/23/20 2232  AST 15  ALT 11  ALKPHOS 53  BILITOT 0.8  PROT 6.7  ALBUMIN 2.9*   Recent Labs  Lab 02/23/20 2232  LIPASE 28   No results for input(s): AMMONIA in the last 168 hours. CBC: Recent Labs  Lab 02/23/20 2232 02/25/20 0400 02/26/20 0055 02/27/20 0411  WBC 9.9 8.2 9.1 9.4  NEUTROABS 7.8*  --  6.6 6.8  HGB 12.1* 10.6* 10.7* 11.2*  HCT 40.1 33.5* 33.9* 35.0*  MCV 89.7 87.9 86.7 86.4  PLT 166 152 166 197   Cardiac Enzymes: No results for input(s): CKTOTAL, CKMB, CKMBINDEX, TROPONINI in the last 168 hours. BNP: Invalid input(s): POCBNP CBG: No results for input(s): GLUCAP in the last 168 hours. D-Dimer No results for input(s): DDIMER in the last 72 hours. Hgb A1c No results for input(s): HGBA1C in the last 72 hours. Lipid Profile No results for input(s): CHOL, HDL, LDLCALC, TRIG, CHOLHDL, LDLDIRECT in the last 72 hours. Thyroid  function studies No results for input(s): TSH, T4TOTAL, T3FREE, THYROIDAB in the last 72 hours.  Invalid input(s): FREET3 Anemia work up No results for input(s): VITAMINB12, FOLATE, FERRITIN, TIBC, IRON, RETICCTPCT in the last 72 hours. Urinalysis    Component Value Date/Time   COLORURINE AMBER (A) 02/24/2020 0328   APPEARANCEUR CLOUDY (A) 02/24/2020 0328   LABSPEC 1.013 02/24/2020 0328   PHURINE 6.0 02/24/2020 0328   GLUCOSEU NEGATIVE 02/24/2020 0328   HGBUR MODERATE (A) 02/24/2020 0328   BILIRUBINUR NEGATIVE 02/24/2020 0328   KETONESUR NEGATIVE 02/24/2020 0328   PROTEINUR 30 (A) 02/24/2020 0328   NITRITE NEGATIVE 02/24/2020 0328   LEUKOCYTESUR LARGE (A) 02/24/2020 0328   Sepsis Labs Invalid input(s): PROCALCITONIN,  WBC,  LACTICIDVEN Microbiology Recent Results (from the past 240 hour(s))  Respiratory Panel by RT PCR (Flu A&B, Covid) - Nasopharyngeal Swab     Status: None   Collection Time: 02/24/20 12:36 AM   Specimen: Nasopharyngeal Swab  Result Value Ref Range Status   SARS Coronavirus 2 by RT PCR NEGATIVE NEGATIVE Final    Comment: (NOTE) SARS-CoV-2 target nucleic acids are NOT DETECTED.  The SARS-CoV-2 RNA is generally detectable in upper respiratoy specimens during the acute phase of infection. The lowest concentration of SARS-CoV-2 viral copies this assay can detect is 131 copies/mL. A  negative result does not preclude SARS-Cov-2 infection and should not be used as the sole basis for treatment or other patient management decisions. A negative result may occur with  improper specimen collection/handling, submission of specimen other than nasopharyngeal swab, presence of viral mutation(s) within the areas targeted by this assay, and inadequate number of viral copies (<131 copies/mL). A negative result must be combined with clinical observations, patient history, and epidemiological information. The expected result is Negative.  Fact Sheet for Patients:   https://www.moore.com/  Fact Sheet for Healthcare Providers:  https://www.young.biz/  This test is no t yet approved or cleared by the [place] FDA and  has been authorized for detection and/or diagnosis of SARS-CoV-2 by FDA under an Emergency Use Authorization (EUA). This EUA will remain  in effect (meaning this test can be used) for the duration of the COVID-19 declaration under Section 564(b)(1) of the Act, 21 U.S.C. section 360bbb-3(b)(1), unless the authorization is terminated or revoked sooner.     Influenza A by PCR NEGATIVE NEGATIVE Final   Influenza B by PCR NEGATIVE NEGATIVE Final    Comment: (NOTE) The Xpert Xpress SARS-CoV-2/FLU/RSV assay is intended as an aid in  the diagnosis of influenza from Nasopharyngeal swab specimens and  should not be used as a sole basis for treatment. Nasal washings and  aspirates are unacceptable for Xpert Xpress SARS-CoV-2/FLU/RSV  testing.  Fact Sheet for Patients: https://www.moore.com/  Fact Sheet for Healthcare Providers: https://www.young.biz/  This test is not yet approved or cleared by the [place] FDA and  has been authorized for detection and/or diagnosis of SARS-CoV-2 by  FDA under an Emergency Use Authorization (EUA). This EUA will remain  in effect (meaning this test can be used) for the duration of the  Covid-19 declaration under Section 564(b)(1) of the Act, 21  U.S.C. section 360bbb-3(b)(1), unless the authorization is  terminated or revoked. Performed at [facility], [address], [place], [place] [address]   Urine culture     Status: Abnormal   Collection Time: 02/24/20  3:17 AM   Specimen: Urine, Random  Result Value Ref Range Status   Specimen Description URINE, RANDOM  Final   Special Requests   Final    NONE Performed at [facility], [address], [place], [place] [address]    Culture MULTIPLE SPECIES  PRESENT, SUGGEST RECOLLECTION (A)  Final   Report Status 02/25/2020 FINAL  Final     Time coordinating discharge: 45 minutes  SIGNED:   Coralie Keens, MD  [facility] 02/27/2020, 8:23 AM

## 2020-03-08 DIAGNOSIS — I82402 Acute embolism and thrombosis of unspecified deep veins of left lower extremity: Secondary | ICD-10-CM | POA: Diagnosis not present

## 2020-03-08 DIAGNOSIS — N200 Calculus of kidney: Secondary | ICD-10-CM | POA: Diagnosis not present

## 2020-03-08 DIAGNOSIS — N132 Hydronephrosis with renal and ureteral calculous obstruction: Secondary | ICD-10-CM | POA: Diagnosis not present

## 2020-03-08 DIAGNOSIS — Q631 Lobulated, fused and horseshoe kidney: Secondary | ICD-10-CM | POA: Diagnosis not present

## 2020-03-08 DIAGNOSIS — Z125 Encounter for screening for malignant neoplasm of prostate: Secondary | ICD-10-CM | POA: Diagnosis not present

## 2020-03-08 DIAGNOSIS — E441 Mild protein-calorie malnutrition: Secondary | ICD-10-CM | POA: Diagnosis not present

## 2020-03-08 DIAGNOSIS — N390 Urinary tract infection, site not specified: Secondary | ICD-10-CM | POA: Diagnosis not present

## 2020-03-08 DIAGNOSIS — I2699 Other pulmonary embolism without acute cor pulmonale: Secondary | ICD-10-CM | POA: Diagnosis not present

## 2020-03-08 DIAGNOSIS — I712 Thoracic aortic aneurysm, without rupture: Secondary | ICD-10-CM | POA: Diagnosis not present

## 2020-04-05 DIAGNOSIS — I10 Essential (primary) hypertension: Secondary | ICD-10-CM | POA: Diagnosis not present

## 2020-04-05 DIAGNOSIS — I82402 Acute embolism and thrombosis of unspecified deep veins of left lower extremity: Secondary | ICD-10-CM | POA: Diagnosis not present

## 2020-04-05 DIAGNOSIS — N132 Hydronephrosis with renal and ureteral calculous obstruction: Secondary | ICD-10-CM | POA: Diagnosis not present

## 2020-04-05 DIAGNOSIS — I2699 Other pulmonary embolism without acute cor pulmonale: Secondary | ICD-10-CM | POA: Diagnosis not present

## 2020-04-07 DIAGNOSIS — N201 Calculus of ureter: Secondary | ICD-10-CM | POA: Diagnosis not present

## 2020-04-07 DIAGNOSIS — Z86711 Personal history of pulmonary embolism: Secondary | ICD-10-CM | POA: Diagnosis not present

## 2020-04-29 ENCOUNTER — Emergency Department (HOSPITAL_COMMUNITY)
Admission: EM | Admit: 2020-04-29 | Discharge: 2020-04-30 | Disposition: A | Discharge status: Home / Self Care | Payer: Medicare HMO | Attending: Emergency Medicine | Admitting: Emergency Medicine

## 2020-04-29 DIAGNOSIS — Z7901 Long term (current) use of anticoagulants: Secondary | ICD-10-CM | POA: Insufficient documentation

## 2020-04-29 DIAGNOSIS — N179 Acute kidney failure, unspecified: Secondary | ICD-10-CM | POA: Insufficient documentation

## 2020-04-29 DIAGNOSIS — I10 Essential (primary) hypertension: Secondary | ICD-10-CM | POA: Diagnosis not present

## 2020-04-29 DIAGNOSIS — R319 Hematuria, unspecified: Secondary | ICD-10-CM | POA: Insufficient documentation

## 2020-04-29 DIAGNOSIS — Z87891 Personal history of nicotine dependence: Secondary | ICD-10-CM | POA: Insufficient documentation

## 2020-04-29 DIAGNOSIS — W19XXXA Unspecified fall, initial encounter: Secondary | ICD-10-CM | POA: Diagnosis not present

## 2020-04-29 DIAGNOSIS — Y92009 Unspecified place in unspecified non-institutional (private) residence as the place of occurrence of the external cause: Secondary | ICD-10-CM | POA: Diagnosis not present

## 2020-04-29 LAB — CBC WITH DIFFERENTIAL/PLATELET
Abs Immature Granulocytes: 0.07 10*3/uL (ref 0.00–0.07)
Basophils Absolute: 0 10*3/uL (ref 0.0–0.1)
Basophils Relative: 0 %
Eosinophils Absolute: 0 10*3/uL (ref 0.0–0.5)
Eosinophils Relative: 0 %
HCT: 35.9 % — ABNORMAL LOW (ref 39.0–52.0)
Hemoglobin: 11.1 g/dL — ABNORMAL LOW (ref 13.0–17.0)
Immature Granulocytes: 1 %
Lymphocytes Relative: 5 %
Lymphs Abs: 0.8 10*3/uL (ref 0.7–4.0)
MCH: 26.7 pg (ref 26.0–34.0)
MCHC: 30.9 g/dL (ref 30.0–36.0)
MCV: 86.5 fL (ref 80.0–100.0)
Monocytes Absolute: 2.1 10*3/uL — ABNORMAL HIGH (ref 0.1–1.0)
Monocytes Relative: 14 %
Neutro Abs: 12.2 10*3/uL — ABNORMAL HIGH (ref 1.7–7.7)
Neutrophils Relative %: 80 %
Platelets: 272 10*3/uL (ref 150–400)
RBC: 4.15 MIL/uL — ABNORMAL LOW (ref 4.22–5.81)
RDW: 14.5 % (ref 11.5–15.5)
WBC: 15.2 10*3/uL — ABNORMAL HIGH (ref 4.0–10.5)
nRBC: 0 % (ref 0.0–0.2)

## 2020-04-29 NOTE — ED Triage Notes (Signed)
Pt from home today with EMS. Pt had a mechanical fall at home, denies hitting his head, -LOC. Pt had a catheter placed today, dried blood noted to pants, but pt denies any trauma to catheter. Pt is on Xarelto, skipped today's dose for catheter placement

## 2020-04-29 NOTE — ED Notes (Signed)
Debbie, step daughter, called wanted update. Please call [phone]

## 2020-04-30 LAB — URINALYSIS, ROUTINE W REFLEX MICROSCOPIC
RBC / HPF: >50 RBC/hpf — ABNORMAL HIGH (ref 0–5)
WBC, UA: >50 WBC/hpf — ABNORMAL HIGH (ref 0–5)

## 2020-04-30 LAB — COMPREHENSIVE METABOLIC PANEL
ALT: 12 U/L (ref 0–44)
AST: 38 U/L (ref 15–41)
Albumin: 2.3 g/dL — ABNORMAL LOW (ref 3.5–5.0)
Alkaline Phosphatase: 45 U/L (ref 38–126)
Anion gap: 12 (ref 5–15)
BUN: 41 mg/dL — ABNORMAL HIGH (ref 8–23)
CO2: 20 mmol/L — ABNORMAL LOW (ref 22–32)
Calcium: 9.1 mg/dL (ref 8.9–10.3)
Chloride: 103 mmol/L (ref 98–111)
Creatinine, Ser: 2.27 mg/dL — ABNORMAL HIGH (ref 0.61–1.24)
GFR, Estimated: 27 mL/min — ABNORMAL LOW (ref >60)
Glucose, Bld: 100 mg/dL — ABNORMAL HIGH (ref 70–99)
Potassium: 4.7 mmol/L (ref 3.5–5.1)
Sodium: 135 mmol/L (ref 135–145)
Total Bilirubin: 1.6 mg/dL — ABNORMAL HIGH (ref 0.3–1.2)
Total Protein: 5.6 g/dL — ABNORMAL LOW (ref 6.5–8.1)

## 2020-04-30 MED ORDER — LACTATED RINGERS IV BOLUS
1000.0000 mL | Freq: Once | INTRAVENOUS | Status: AC
  Administered 2020-04-30: 1000 mL via INTRAVENOUS

## 2020-04-30 NOTE — ED Provider Notes (Signed)
[facility] EMERGENCY DEPARTMENT Provider Note   CSN: [id number] Arrival date & time: 04/29/20  2236     History Chief Complaint  Patient presents with  . Fall    Corey Cummings is a 87 y.o. male.   Fall This is a new problem. The current episode started 1 to 2 hours ago. The problem occurs constantly. The problem has not changed since onset.Pertinent negatives include no chest pain, no abdominal pain, no headaches and no shortness of breath. Nothing aggravates the symptoms. Nothing relieves the symptoms. He has tried nothing for the symptoms. The treatment provided no relief.       Past Medical History:  Diagnosis Date  . BPH (benign prostatic hyperplasia)   . Hypertension     Patient Active Problem List   Diagnosis Date Noted  . Acute pulmonary embolism (HCC) 02/24/2020  . Ureteral obstruction, right 02/24/2020  . UTI (urinary tract infection) 02/24/2020  . Renal insufficiency 02/24/2020  . Thoracic aortic aneurysm without rupture (HCC) 02/24/2020  . Bronchiectasis (HCC) 02/24/2020  . Pulmonary embolism (HCC) 02/24/2020  . Hydronephrosis with urinary obstruction due to ureteral calculus     No past surgical history on file.     No family history on file.  Social History   Tobacco Use  . Smoking status: Former Smoker    Types: Cigarettes  . Tobacco comment: Quit 40 years ago    Home Medications Prior to Admission medications   Medication Sig Start Date End Date Taking? Authorizing Provider  finasteride (PROSCAR) 5 MG tablet Take 5 mg by mouth daily. 01/12/20   [provider]  olmesartan (BENICAR) 20 MG tablet Take 20 mg by mouth daily. 09/09/19   [provider]  RIVAROXABAN Carlena Hurl) VTE STARTER PACK (15 & 20 MG) Follow package directions: Take one 15mg  tablet by mouth twice a day. On day 22, switch to one 20mg  tablet once a day. Take with food. 02/27/20   Arrien, , MD    Allergies    Patient has no known  allergies.  Review of Systems   Review of Systems  Respiratory: Negative for shortness of breath.   Cardiovascular: Negative for chest pain.  Gastrointestinal: Negative for abdominal pain.  Neurological: Negative for headaches.  All other systems reviewed and are negative.   Physical Exam Updated Vital Signs BP (!) 152/80   Pulse 74   Temp 97.8 F (36.6 C) (Oral)   Resp (!) 30   SpO2 95%   Physical Exam Vitals and nursing note reviewed.  Constitutional:      Appearance: He is well-developed and well-nourished.  HENT:     Head: Normocephalic and atraumatic.     Nose: No congestion or rhinorrhea.     Mouth/Throat:     Mouth: Mucous membranes are moist.     Pharynx: Oropharynx is clear.  Eyes:     Pupils: Pupils are equal, round, and reactive to light.  Cardiovascular:     Rate and Rhythm: Normal rate.  Pulmonary:     Effort: Pulmonary effort is normal. No respiratory distress.  Abdominal:     General: Abdomen is flat. There is no distension.  Musculoskeletal:        General: Normal range of motion.     Cervical back: Normal range of motion.     Comments: No cervical spine tenderness, thoracic spine tenderness or Lumbar spine tenderness.  No tenderness or pain with palpation and full ROM of all joints in upper and lower  extremities.  No ecchymosis or other signs of trauma on back or extremities.  No Pain with AP or lateral compression of ribs.  No Paracervical ttp, paraspinal ttp\  Skin:    General: Skin is warm and dry.     Coloration: Skin is not jaundiced or pale.  Neurological:     General: No focal deficit present.     Mental Status: He is alert.     ED Results / Procedures / Treatments   Labs (all labs ordered are listed, but only abnormal results are displayed) Labs Reviewed  CBC WITH DIFFERENTIAL/PLATELET - Abnormal; Notable for the following components:      Result Value   WBC 15.2 (*)    RBC 4.15 (*)    Hemoglobin 11.1 (*)    HCT 35.9 (*)     Neutro Abs 12.2 (*)    Monocytes Absolute 2.1 (*)    All other components within normal limits  COMPREHENSIVE METABOLIC PANEL - Abnormal; Notable for the following components:   CO2 20 (*)    Glucose, Bld 100 (*)    BUN 41 (*)    Creatinine, Ser 2.27 (*)    Total Protein 5.6 (*)    Albumin 2.3 (*)    Total Bilirubin 1.6 (*)    GFR, Estimated 27 (*)    All other components within normal limits  URINALYSIS, ROUTINE W REFLEX MICROSCOPIC - Abnormal; Notable for the following components:   Color, Urine RED (*)    APPearance TURBID (*)    Glucose, UA   (*)    Value: TEST NOT REPORTED DUE TO COLOR INTERFERENCE OF URINE PIGMENT   Hgb urine dipstick   (*)    Value: TEST NOT REPORTED DUE TO COLOR INTERFERENCE OF URINE PIGMENT   Bilirubin Urine   (*)    Value: TEST NOT REPORTED DUE TO COLOR INTERFERENCE OF URINE PIGMENT   Ketones, ur   (*)    Value: TEST NOT REPORTED DUE TO COLOR INTERFERENCE OF URINE PIGMENT   Protein, ur   (*)    Value: TEST NOT REPORTED DUE TO COLOR INTERFERENCE OF URINE PIGMENT   Nitrite   (*)    Value: TEST NOT REPORTED DUE TO COLOR INTERFERENCE OF URINE PIGMENT   Leukocytes,Ua   (*)    Value: TEST NOT REPORTED DUE TO COLOR INTERFERENCE OF URINE PIGMENT   RBC / HPF >50 (*)    WBC, UA >50 (*)    Bacteria, UA RARE (*)    All other components within normal limits  URINE CULTURE    EKG EKG Interpretation  Date/Time:  Friday April 29 2020 23:12:12 EST Ventricular Rate:  90 PR Interval:    QRS Duration: 86 QT Interval:  362 QTC Calculation: 443 R Axis:   -15 Text Interpretation: Sinus rhythm Borderline left axis deviation Anteroseptal infarct, old Confirmed by Marily Memos [phone]) on 04/29/2020 11:26:52 PM   Radiology No results found.  Procedures Procedures (including critical care time)  Medications Ordered in ED Medications  lactated ringers bolus 1,000 mL (0 mLs Intravenous Stopped 04/30/20 0246)    ED Course  I have reviewed the triage vital  signs and the nursing notes.  Pertinent labs & imaging results that were available during my care of the patient were reviewed by me and considered in my medical decision making (see chart for details).    MDM Rules/Calculators/A&P  Workup as above. Slight AKI, fluids given, will need recheck at pcp. No e/o bony injury. Has some blood on his pants, likely related to foley trauma. Still working though, urine sample cultured.   Final Clinical Impression(s) / ED Diagnoses Final diagnoses:  Fall, initial encounter  AKI (acute kidney injury) [facility])    Rx / DC Orders ED Discharge Orders    None       Lillyonna Armstead, Barbara Cower, MD 04/30/20 [phone]

## 2020-05-01 LAB — URINE CULTURE: Culture: NO GROWTH

## 2020-10-07 ENCOUNTER — Telehealth: Payer: Self-pay | Admitting: Oncology

## 2020-10-07 NOTE — Telephone Encounter (Signed)
Received a new hem referral from Dr. Paulino Rily for anemia. Mr. Corey Cummings has been scheduled to see Dr. Clelia Croft on 7/5 at 2pm. Pt aware to arrive 15 minutes early.

## 2020-10-11 ENCOUNTER — Inpatient Hospital Stay: Payer: Medicare HMO | Attending: Oncology | Admitting: Oncology

## 2020-10-11 ENCOUNTER — Telehealth: Payer: Self-pay | Admitting: *Deleted

## 2020-10-11 NOTE — Telephone Encounter (Signed)
PC to patient about today's missed appointment, he states he was unaware.  Informed patient he had been called on 10/07/20 about appointment, he states he never uses his voicemail.  Scheduling message sent to reschedule, informed them patient does not use voicemail.

## 2020-10-17 ENCOUNTER — Telehealth: Payer: Self-pay | Admitting: Oncology

## 2020-10-17 NOTE — Telephone Encounter (Signed)
I attempted to call Corey Cummings to reschedule his new hem appt but the pt's vm never picked up. I was unable to reschedule the pt.

## 2020-12-04 ENCOUNTER — Inpatient Hospital Stay (HOSPITAL_COMMUNITY): Payer: Medicare HMO | Admitting: Anesthesiology

## 2020-12-04 ENCOUNTER — Encounter (HOSPITAL_COMMUNITY): Payer: Self-pay

## 2020-12-04 ENCOUNTER — Inpatient Hospital Stay (HOSPITAL_COMMUNITY)
Admission: EM | Admit: 2020-12-04 | Discharge: 2020-12-07 | DRG: 659 | Disposition: A | Discharge status: Home / Self Care | Payer: Medicare HMO | Attending: Internal Medicine | Admitting: Internal Medicine

## 2020-12-04 ENCOUNTER — Emergency Department (HOSPITAL_COMMUNITY): Payer: Medicare HMO

## 2020-12-04 ENCOUNTER — Encounter (HOSPITAL_COMMUNITY)
Admission: EM | Disposition: A | Discharge status: Home / Self Care | Payer: Self-pay | Source: Home / Self Care | Attending: Internal Medicine

## 2020-12-04 ENCOUNTER — Inpatient Hospital Stay (HOSPITAL_COMMUNITY): Payer: Medicare HMO

## 2020-12-04 DIAGNOSIS — D631 Anemia in chronic kidney disease: Secondary | ICD-10-CM | POA: Diagnosis present

## 2020-12-04 DIAGNOSIS — N320 Bladder-neck obstruction: Secondary | ICD-10-CM | POA: Diagnosis present

## 2020-12-04 DIAGNOSIS — N1832 Chronic kidney disease, stage 3b: Secondary | ICD-10-CM | POA: Diagnosis present

## 2020-12-04 DIAGNOSIS — N183 Chronic kidney disease, stage 3 unspecified: Secondary | ICD-10-CM

## 2020-12-04 DIAGNOSIS — N401 Enlarged prostate with lower urinary tract symptoms: Secondary | ICD-10-CM | POA: Diagnosis present

## 2020-12-04 DIAGNOSIS — R6520 Severe sepsis without septic shock: Secondary | ICD-10-CM | POA: Diagnosis present

## 2020-12-04 DIAGNOSIS — Q631 Lobulated, fused and horseshoe kidney: Secondary | ICD-10-CM

## 2020-12-04 DIAGNOSIS — E872 Acidosis, unspecified: Secondary | ICD-10-CM

## 2020-12-04 DIAGNOSIS — N136 Pyonephrosis: Secondary | ICD-10-CM | POA: Diagnosis present

## 2020-12-04 DIAGNOSIS — R739 Hyperglycemia, unspecified: Secondary | ICD-10-CM | POA: Diagnosis present

## 2020-12-04 DIAGNOSIS — N210 Calculus in bladder: Secondary | ICD-10-CM | POA: Diagnosis present

## 2020-12-04 DIAGNOSIS — I160 Hypertensive urgency: Secondary | ICD-10-CM | POA: Diagnosis present

## 2020-12-04 DIAGNOSIS — N400 Enlarged prostate without lower urinary tract symptoms: Secondary | ICD-10-CM | POA: Diagnosis not present

## 2020-12-04 DIAGNOSIS — Z20822 Contact with and (suspected) exposure to covid-19: Secondary | ICD-10-CM | POA: Diagnosis present

## 2020-12-04 DIAGNOSIS — B9689 Other specified bacterial agents as the cause of diseases classified elsewhere: Secondary | ICD-10-CM | POA: Diagnosis present

## 2020-12-04 DIAGNOSIS — I129 Hypertensive chronic kidney disease with stage 1 through stage 4 chronic kidney disease, or unspecified chronic kidney disease: Secondary | ICD-10-CM | POA: Diagnosis present

## 2020-12-04 DIAGNOSIS — R7989 Other specified abnormal findings of blood chemistry: Secondary | ICD-10-CM | POA: Diagnosis present

## 2020-12-04 DIAGNOSIS — Y846 Urinary catheterization as the cause of abnormal reaction of the patient, or of later complication, without mention of misadventure at the time of the procedure: Secondary | ICD-10-CM | POA: Diagnosis present

## 2020-12-04 DIAGNOSIS — N200 Calculus of kidney: Secondary | ICD-10-CM | POA: Diagnosis not present

## 2020-12-04 DIAGNOSIS — I82532 Chronic embolism and thrombosis of left popliteal vein: Secondary | ICD-10-CM | POA: Diagnosis present

## 2020-12-04 DIAGNOSIS — A4189 Other specified sepsis: Secondary | ICD-10-CM | POA: Diagnosis present

## 2020-12-04 DIAGNOSIS — N138 Other obstructive and reflux uropathy: Secondary | ICD-10-CM | POA: Diagnosis present

## 2020-12-04 DIAGNOSIS — N3289 Other specified disorders of bladder: Secondary | ICD-10-CM | POA: Diagnosis present

## 2020-12-04 DIAGNOSIS — A419 Sepsis, unspecified organism: Secondary | ICD-10-CM

## 2020-12-04 DIAGNOSIS — I2699 Other pulmonary embolism without acute cor pulmonale: Secondary | ICD-10-CM | POA: Diagnosis present

## 2020-12-04 DIAGNOSIS — Z978 Presence of other specified devices: Secondary | ICD-10-CM | POA: Diagnosis not present

## 2020-12-04 DIAGNOSIS — K4091 Unilateral inguinal hernia, without obstruction or gangrene, recurrent: Secondary | ICD-10-CM

## 2020-12-04 DIAGNOSIS — Z87891 Personal history of nicotine dependence: Secondary | ICD-10-CM

## 2020-12-04 DIAGNOSIS — N132 Hydronephrosis with renal and ureteral calculous obstruction: Secondary | ICD-10-CM | POA: Diagnosis not present

## 2020-12-04 DIAGNOSIS — K4090 Unilateral inguinal hernia, without obstruction or gangrene, not specified as recurrent: Secondary | ICD-10-CM | POA: Diagnosis present

## 2020-12-04 DIAGNOSIS — I2782 Chronic pulmonary embolism: Secondary | ICD-10-CM

## 2020-12-04 DIAGNOSIS — N10 Acute tubulo-interstitial nephritis: Secondary | ICD-10-CM

## 2020-12-04 DIAGNOSIS — D62 Acute posthemorrhagic anemia: Secondary | ICD-10-CM | POA: Diagnosis present

## 2020-12-04 DIAGNOSIS — G9341 Metabolic encephalopathy: Secondary | ICD-10-CM | POA: Diagnosis present

## 2020-12-04 DIAGNOSIS — T839XXA Unspecified complication of genitourinary prosthetic device, implant and graft, initial encounter: Secondary | ICD-10-CM | POA: Diagnosis present

## 2020-12-04 DIAGNOSIS — R609 Edema, unspecified: Secondary | ICD-10-CM | POA: Diagnosis not present

## 2020-12-04 DIAGNOSIS — R300 Dysuria: Secondary | ICD-10-CM | POA: Diagnosis not present

## 2020-12-04 DIAGNOSIS — N3000 Acute cystitis without hematuria: Secondary | ICD-10-CM

## 2020-12-04 DIAGNOSIS — Z86711 Personal history of pulmonary embolism: Secondary | ICD-10-CM | POA: Diagnosis not present

## 2020-12-04 DIAGNOSIS — T83518A Infection and inflammatory reaction due to other urinary catheter, initial encounter: Secondary | ICD-10-CM | POA: Diagnosis present

## 2020-12-04 DIAGNOSIS — N135 Crossing vessel and stricture of ureter without hydronephrosis: Secondary | ICD-10-CM

## 2020-12-04 HISTORY — PX: CYSTOSCOPY W/ URETERAL STENT PLACEMENT: SHX1429

## 2020-12-04 LAB — URINALYSIS, ROUTINE W REFLEX MICROSCOPIC
Bilirubin Urine: NEGATIVE
Glucose, UA: NEGATIVE mg/dL
Ketones, ur: NEGATIVE mg/dL
Nitrite: POSITIVE — AB
Protein, ur: 100 mg/dL — AB
RBC / HPF: >50 RBC/hpf — ABNORMAL HIGH (ref 0–5)
Specific Gravity, Urine: 1.009 (ref 1.005–1.030)
WBC, UA: >50 WBC/hpf — ABNORMAL HIGH (ref 0–5)
pH: 6 (ref 5.0–8.0)

## 2020-12-04 LAB — CBC
HCT: 34.1 % — ABNORMAL LOW (ref 39.0–52.0)
Hemoglobin: 10.6 g/dL — ABNORMAL LOW (ref 13.0–17.0)
MCH: 25.7 pg — ABNORMAL LOW (ref 26.0–34.0)
MCHC: 31.1 g/dL (ref 30.0–36.0)
MCV: 82.6 fL (ref 80.0–100.0)
Platelets: 355 K/uL (ref 150–400)
RBC: 4.13 MIL/uL — ABNORMAL LOW (ref 4.22–5.81)
RDW: 18.1 % — ABNORMAL HIGH (ref 11.5–15.5)
WBC: 11.9 K/uL — ABNORMAL HIGH (ref 4.0–10.5)
nRBC: 0 % (ref 0.0–0.2)

## 2020-12-04 LAB — BASIC METABOLIC PANEL WITH GFR
Anion gap: 11 (ref 5–15)
BUN: 26 mg/dL — ABNORMAL HIGH (ref 8–23)
CO2: 23 mmol/L (ref 22–32)
Calcium: 9.1 mg/dL (ref 8.9–10.3)
Chloride: 103 mmol/L (ref 98–111)
Creatinine, Ser: 1.55 mg/dL — ABNORMAL HIGH (ref 0.61–1.24)
GFR, Estimated: 43 mL/min — ABNORMAL LOW
Glucose, Bld: 201 mg/dL — ABNORMAL HIGH (ref 70–99)
Potassium: 3.9 mmol/L (ref 3.5–5.1)
Sodium: 137 mmol/L (ref 135–145)

## 2020-12-04 LAB — HEPATIC FUNCTION PANEL
ALT: 11 U/L (ref 0–44)
AST: 21 U/L (ref 15–41)
Albumin: 2.2 g/dL — ABNORMAL LOW (ref 3.5–5.0)
Alkaline Phosphatase: 42 U/L (ref 38–126)
Bilirubin, Direct: 0.2 mg/dL (ref 0.0–0.2)
Indirect Bilirubin: 0.4 mg/dL (ref 0.3–0.9)
Total Bilirubin: 0.6 mg/dL (ref 0.3–1.2)
Total Protein: 5.9 g/dL — ABNORMAL LOW (ref 6.5–8.1)

## 2020-12-04 LAB — RESP PANEL BY RT-PCR (FLU A&B, COVID) ARPGX2
Influenza A by PCR: NEGATIVE
Influenza B by PCR: NEGATIVE
SARS Coronavirus 2 by RT PCR: NEGATIVE

## 2020-12-04 LAB — PROTIME-INR
INR: 1.8 — ABNORMAL HIGH (ref 0.8–1.2)
Prothrombin Time: 21 seconds — ABNORMAL HIGH (ref 11.4–15.2)

## 2020-12-04 LAB — LACTIC ACID, PLASMA
Lactic Acid, Venous: 3.5 mmol/L (ref 0.5–1.9)
Lactic Acid, Venous: 1.2 mmol/L (ref 0.5–1.9)

## 2020-12-04 LAB — APTT: aPTT: 35 seconds (ref 24–36)

## 2020-12-04 SURGERY — CYSTOSCOPY, WITH RETROGRADE PYELOGRAM AND URETERAL STENT INSERTION
Anesthesia: General | Site: Ureter | Laterality: Right

## 2020-12-04 MED ORDER — FENTANYL CITRATE (PF) 100 MCG/2ML IJ SOLN
INTRAMUSCULAR | Status: AC
  Filled 2020-12-04: qty 2

## 2020-12-04 MED ORDER — ACETAMINOPHEN 325 MG PO TABS: 650.0000 mg | ORAL_TABLET | Freq: Four times a day (QID) | ORAL | Status: DC | PRN

## 2020-12-04 MED ORDER — SODIUM CHLORIDE 0.9 % IV BOLUS (SEPSIS)
1000.0000 mL | Freq: Once | INTRAVENOUS | Status: AC
  Administered 2020-12-04: 1000 mL via INTRAVENOUS

## 2020-12-04 MED ORDER — FENTANYL CITRATE (PF) 100 MCG/2ML IJ SOLN
INTRAMUSCULAR | Status: DC | PRN
  Administered 2020-12-04 (×2): 25 ug via INTRAVENOUS

## 2020-12-04 MED ORDER — IRBESARTAN 150 MG PO TABS
150.0000 mg | ORAL_TABLET | Freq: Every day | ORAL | Status: DC
  Administered 2020-12-05 – 2020-12-07 (×3): 150 mg via ORAL
  Filled 2020-12-04 (×3): qty 1

## 2020-12-04 MED ORDER — STERILE WATER FOR IRRIGATION IR SOLN
Status: DC | PRN
  Administered 2020-12-04: 3000 mL

## 2020-12-04 MED ORDER — ONDANSETRON HCL 4 MG/2ML IJ SOLN
INTRAMUSCULAR | Status: AC
  Filled 2020-12-04: qty 2

## 2020-12-04 MED ORDER — CEPHALEXIN 500 MG PO CAPS: 500.0000 mg | ORAL_CAPSULE | Freq: Two times a day (BID) | ORAL | 0 refills | Status: DC

## 2020-12-04 MED ORDER — ACETAMINOPHEN 650 MG RE SUPP
650.0000 mg | Freq: Four times a day (QID) | RECTAL | Status: DC | PRN
  Filled 2020-12-04: qty 1

## 2020-12-04 MED ORDER — VANCOMYCIN HCL 750 MG/150ML IV SOLN
750.0000 mg | INTRAVENOUS | Status: DC
  Administered 2020-12-05: 750 mg via INTRAVENOUS
  Filled 2020-12-04: qty 150

## 2020-12-04 MED ORDER — LACTATED RINGERS IV SOLN: INTRAVENOUS | Status: DC | PRN

## 2020-12-04 MED ORDER — DEXAMETHASONE SODIUM PHOSPHATE 10 MG/ML IJ SOLN
INTRAMUSCULAR | Status: AC
  Filled 2020-12-04: qty 1

## 2020-12-04 MED ORDER — CEFAZOLIN SODIUM-DEXTROSE 2-3 GM-%(50ML) IV SOLR
INTRAVENOUS | Status: DC | PRN
  Administered 2020-12-04: 2 g via INTRAVENOUS

## 2020-12-04 MED ORDER — ONDANSETRON HCL 4 MG/2ML IJ SOLN: 4.0000 mg | Freq: Once | INTRAMUSCULAR | Status: DC | PRN

## 2020-12-04 MED ORDER — PHENYLEPHRINE HCL-NACL 20-0.9 MG/250ML-% IV SOLN
INTRAVENOUS | Status: DC | PRN
  Administered 2020-12-04: 25 ug/min via INTRAVENOUS

## 2020-12-04 MED ORDER — PROPOFOL 10 MG/ML IV BOLUS
INTRAVENOUS | Status: AC
  Filled 2020-12-04: qty 20

## 2020-12-04 MED ORDER — IOHEXOL 300 MG/ML  SOLN
INTRAMUSCULAR | Status: DC | PRN
  Administered 2020-12-04: 3 mL

## 2020-12-04 MED ORDER — FINASTERIDE 5 MG PO TABS
5.0000 mg | ORAL_TABLET | Freq: Every day | ORAL | Status: DC
  Administered 2020-12-05 – 2020-12-07 (×3): 5 mg via ORAL
  Filled 2020-12-04 (×3): qty 1

## 2020-12-04 MED ORDER — CHLORHEXIDINE GLUCONATE CLOTH 2 % EX PADS
6.0000 | MEDICATED_PAD | Freq: Every day | CUTANEOUS | Status: DC
  Administered 2020-12-05 – 2020-12-07 (×3): 6 via TOPICAL

## 2020-12-04 MED ORDER — ONDANSETRON HCL 4 MG/2ML IJ SOLN: 4.0000 mg | Freq: Four times a day (QID) | INTRAMUSCULAR | Status: DC | PRN

## 2020-12-04 MED ORDER — FENTANYL CITRATE PF 50 MCG/ML IJ SOSY: 25.0000 ug | PREFILLED_SYRINGE | INTRAMUSCULAR | Status: DC | PRN

## 2020-12-04 MED ORDER — SODIUM CHLORIDE 0.9 % IV SOLN: INTRAVENOUS | Status: AC

## 2020-12-04 MED ORDER — VANCOMYCIN HCL 1500 MG/300ML IV SOLN
1500.0000 mg | Freq: Once | INTRAVENOUS | Status: AC
  Administered 2020-12-04: 1500 mg via INTRAVENOUS
  Filled 2020-12-04: qty 300

## 2020-12-04 MED ORDER — ACETAMINOPHEN 500 MG PO TABS
1000.0000 mg | ORAL_TABLET | Freq: Once | ORAL | Status: DC
Start: 2020-12-04 — End: 2020-12-07
  Filled 2020-12-04: qty 2

## 2020-12-04 MED ORDER — ONDANSETRON HCL 4 MG/2ML IJ SOLN
INTRAMUSCULAR | Status: DC | PRN
  Administered 2020-12-04: 4 mg via INTRAVENOUS

## 2020-12-04 MED ORDER — MORPHINE SULFATE (PF) 4 MG/ML IV SOLN
4.0000 mg | Freq: Once | INTRAVENOUS | Status: AC
  Administered 2020-12-04: 4 mg via INTRAVENOUS
  Filled 2020-12-04: qty 1

## 2020-12-04 MED ORDER — LIDOCAINE 2% (20 MG/ML) 5 ML SYRINGE
INTRAMUSCULAR | Status: DC | PRN
  Administered 2020-12-04: 40 mg via INTRAVENOUS

## 2020-12-04 MED ORDER — OXYCODONE HCL 5 MG/5ML PO SOLN
5.0000 mg | Freq: Once | ORAL | Status: DC | PRN
Start: 2020-12-04 — End: 2020-12-04

## 2020-12-04 MED ORDER — ONDANSETRON HCL 4 MG PO TABS
4.0000 mg | ORAL_TABLET | Freq: Four times a day (QID) | ORAL | Status: DC | PRN
  Filled 2020-12-04: qty 1

## 2020-12-04 MED ORDER — SODIUM CHLORIDE 0.9 % IV SOLN
2.0000 g | Freq: Once | INTRAVENOUS | Status: AC
  Administered 2020-12-04: 2 g via INTRAVENOUS
  Filled 2020-12-04: qty 2

## 2020-12-04 MED ORDER — PROPOFOL 10 MG/ML IV BOLUS
INTRAVENOUS | Status: DC | PRN
  Administered 2020-12-04: 80 mg via INTRAVENOUS

## 2020-12-04 MED ORDER — ONDANSETRON HCL 4 MG/2ML IJ SOLN
4.0000 mg | Freq: Once | INTRAMUSCULAR | Status: AC
  Administered 2020-12-04: 4 mg via INTRAVENOUS
  Filled 2020-12-04: qty 2

## 2020-12-04 MED ORDER — TAMSULOSIN HCL 0.4 MG PO CAPS
0.4000 mg | ORAL_CAPSULE | Freq: Every day | ORAL | Status: DC
  Administered 2020-12-05 – 2020-12-07 (×3): 0.4 mg via ORAL
  Filled 2020-12-04 (×3): qty 1

## 2020-12-04 MED ORDER — CEFAZOLIN SODIUM-DEXTROSE 2-4 GM/100ML-% IV SOLN
INTRAVENOUS | Status: AC
  Filled 2020-12-04: qty 100

## 2020-12-04 MED ORDER — CEPHALEXIN 500 MG PO CAPS
500.0000 mg | ORAL_CAPSULE | Freq: Once | ORAL | Status: AC
  Administered 2020-12-04: 500 mg via ORAL
  Filled 2020-12-04: qty 1

## 2020-12-04 MED ORDER — PHENYLEPHRINE HCL (PRESSORS) 10 MG/ML IV SOLN
INTRAVENOUS | Status: AC
  Filled 2020-12-04: qty 1

## 2020-12-04 MED ORDER — LIDOCAINE 2% (20 MG/ML) 5 ML SYRINGE
INTRAMUSCULAR | Status: AC
  Filled 2020-12-04: qty 5

## 2020-12-04 MED ORDER — DEXAMETHASONE SODIUM PHOSPHATE 10 MG/ML IJ SOLN
INTRAMUSCULAR | Status: DC | PRN
  Administered 2020-12-04: 10 mg via INTRAVENOUS

## 2020-12-04 MED ORDER — SODIUM CHLORIDE 0.9 % IV SOLN
2.0000 g | Freq: Two times a day (BID) | INTRAVENOUS | Status: DC
  Administered 2020-12-05 (×2): 2 g via INTRAVENOUS
  Filled 2020-12-04 (×5): qty 2

## 2020-12-04 MED ORDER — OXYCODONE HCL 5 MG PO TABS: 5.0000 mg | ORAL_TABLET | Freq: Once | ORAL | Status: DC | PRN

## 2020-12-04 SURGICAL SUPPLY — 15 items
BAG URO CATCHER STRL LF (MISCELLANEOUS) ×2 IMPLANT
CATH TIEMANN FOLEY 18FR 5CC (CATHETERS) ×2 IMPLANT
CATH URET 5FR 28IN OPEN ENDED (CATHETERS) IMPLANT
CLOTH BEACON ORANGE TIMEOUT ST (SAFETY) ×2 IMPLANT
GLOVE SURG ENC TEXT LTX SZ7 (GLOVE) ×2 IMPLANT
GOWN STRL REUS W/TWL LRG LVL3 (GOWN DISPOSABLE) ×2 IMPLANT
GUIDEWIRE STR DUAL SENSOR (WIRE) ×2 IMPLANT
GUIDEWIRE ZIPWRE .038 STRAIGHT (WIRE) IMPLANT
KIT TURNOVER KIT A (KITS) ×2 IMPLANT
MANIFOLD NEPTUNE II (INSTRUMENTS) ×2 IMPLANT
PACK CYSTO (CUSTOM PROCEDURE TRAY) ×2 IMPLANT
STENT URET 6FRX26 CONTOUR (STENTS) ×2 IMPLANT
SYR 10ML LL (SYRINGE) ×2 IMPLANT
TUBING CONNECTING 10 (TUBING) ×2 IMPLANT
TUBING UROLOGY SET (TUBING) IMPLANT

## 2020-12-04 NOTE — ED Provider Notes (Signed)
[facility]-EMERGENCY DEPT Provider Note   CSN: [id number] Arrival date & time: 12/04/20  9518     History Chief Complaint  Patient presents with   Dysuria    Corey Cummings is a 87 y.o. male.  Who presents emergency department with chief complaint of flank pain and urgency to urinate.  Patient states that around 2:00 in the morning he was woken out of his sleep with sharp, stabbing, flank pain on the right side and midline radiating around to the front of his abdomen in the right lower quadrant.  He has associated stated urgency to urinate and feels like he is retaining urine.  He also has noticed swelling in his scrotum.  This all developed today.  He states that this feels similar to a previous kidney stone he had.  He denies fevers or chills, nausea or vomiting.  He rates his pain at 9 out of 10.   Dysuria Presenting symptoms: dysuria       Past Medical History:  Diagnosis Date   BPH (benign prostatic hyperplasia)    Hypertension     Patient Active Problem List   Diagnosis Date Noted   Acute pulmonary embolism (HCC) 02/24/2020   Ureteral obstruction, right 02/24/2020   UTI (urinary tract infection) 02/24/2020   Renal insufficiency 02/24/2020   Thoracic aortic aneurysm without rupture (HCC) 02/24/2020   Bronchiectasis (HCC) 02/24/2020   Pulmonary embolism (HCC) 02/24/2020   Hydronephrosis with urinary obstruction due to ureteral calculus     No past surgical history on file.     No family history on file.  Social History   Tobacco Use   Smoking status: Former    Types: Cigarettes   Tobacco comments:    Quit 40 years ago    Home Medications Prior to Admission medications   Medication Sig Start Date End Date Taking? Authorizing Provider  finasteride (PROSCAR) 5 MG tablet Take 5 mg by mouth daily. 01/12/20   [provider]  olmesartan (BENICAR) 20 MG tablet Take 20 mg by mouth daily. 09/09/19   [provider]  RIVAROXABAN  Carlena Hurl) VTE STARTER PACK (15 & 20 MG) Follow package directions: Take one 15mg  tablet by mouth twice a day. On day 22, switch to one 20mg  tablet once a day. Take with food. 02/27/20   Arrien, , MD    Allergies    Patient has no known allergies.  Review of Systems   Review of Systems  Genitourinary:  Positive for dysuria.  Ten systems reviewed and are negative for acute change, except as noted in the HPI.   Physical Exam Updated Vital Signs There were no vitals taken for this visit.  Physical Exam Vitals and nursing note reviewed.  Constitutional:      General: He is not in acute distress.    Appearance: He is well-developed. He is not diaphoretic.  HENT:     Head: Normocephalic and atraumatic.  Eyes:     General: No scleral icterus.    Conjunctiva/sclera: Conjunctivae normal.  Cardiovascular:     Rate and Rhythm: Normal rate and regular rhythm.     Heart sounds: Normal heart sounds.  Pulmonary:     Effort: Pulmonary effort is normal. No respiratory distress.     Breath sounds: Normal breath sounds.  Abdominal:     General: There is distension (suprapubic).     Palpations: Abdomen is soft.     Tenderness: There is no abdominal tenderness. There is no right CVA tenderness  or left CVA tenderness.  Genitourinary:    Comments: Large right sided inguinal hernia Foley catheter in place. Musculoskeletal:     Cervical back: Normal range of motion and neck supple.  Skin:    General: Skin is warm and dry.  Neurological:     Mental Status: He is alert.  Psychiatric:        Behavior: Behavior normal.    ED Results / Procedures / Treatments   Labs (all labs ordered are listed, but only abnormal results are displayed) Labs Reviewed  BASIC METABOLIC PANEL  CBC  URINALYSIS, ROUTINE W REFLEX MICROSCOPIC    EKG None  Radiology No results found.  Procedures .Critical Care  Date/Time: 12/04/2020 3:41 PM Performed by: Arthor Captain, PA-C Authorized by:  Arthor Captain, PA-C   Critical care provider statement:    Critical care time (minutes):  46   Critical care time was exclusive of:  Separately billable procedures and treating other patients   Critical care was necessary to treat or prevent imminent or life-threatening deterioration of the following conditions:  Sepsis   Critical care was time spent personally by me on the following activities:  Discussions with consultants, evaluation of patient's response to treatment, examination of patient, ordering and performing treatments and interventions, ordering and review of laboratory studies, ordering and review of radiographic studies, pulse oximetry, re-evaluation of patient's condition, obtaining history from patient or surrogate and review of old charts   Medications Ordered in ED Medications  ondansetron (ZOFRAN) injection 4 mg (has no administration in time range)  morphine 4 MG/ML injection 4 mg (has no administration in time range)    ED Course  I have reviewed the triage vital signs and the nursing notes.  Pertinent labs & imaging results that were available during my care of the patient were reviewed by me and considered in my medical decision making (see chart for details).  Clinical Course as of 12/04/20 1541  Sun Dec 04, 2020  0932 Inguinal hernia reduced by Dr. Lockie Mola (see his note).  Pain improved. [AH]    Clinical Course User Index [AH] Arthor Captain, PA-C   MDM Rules/Calculators/A&P                           1:13 PM Patient  We checked a rectal temperature given his tachycardia and he is noted to have a fever, confusion, tachycardia and known source of infection with catheter associated UTI.  He already got Keflex however we are now expanding her coverage with cefepime and vanc, fluids and patient will need to be admitted to the hospital. CT scan shows right sided unchanged hydronephrosis except there is new layering and gas in the renal pelvis suggestive of  gas-forming pyelonephritis.  Patient here here with complaint of urinary retention, flank pain on the right side.  I ordered and reviewed labs that included CBC with elevated white blood cell count of 11.9.  He has mild normocytic anemia.  BMP with elevated blood glucose mildly elevated creatinine.  This appears to be at baseline. Urinalysis obtained from clean, freshly placed Foley catheter shows infection.  Urine sent for culture.  Patient became more confused and remained tachycardic.  Found to have rectal temperature of 102.3 and placed on sepsis protocol.  I ordered and reviewed images of a portable 1 view chest x-ray which showed no acute abnormalities.  I ordered and reviewed a CT renal stone study which shows no worsening in hydronephrosis, right-sided  staghorn calculus with new layering in the renal pelvis suggestive of gas-forming organism and pyelonephritis.  Patient placed on v cefepime.  He will be admitted to the hospitalist service for further management.   Rx / DC Orders ED Discharge Orders     None        Arthor Captain, PA-C 12/04/20 1543    Virgina Norfolk, DO 12/04/20 2304

## 2020-12-04 NOTE — Anesthesia Postprocedure Evaluation (Signed)
Anesthesia Post Note  Patient: Corey Cummings  Procedure(s) Performed: CYSTOSCOPY WITH RETROGRADE PYELOGRAM/URETERAL STENT PLACEMENT (Right: Ureter)     Patient location during evaluation: PACU Anesthesia Type: General Level of consciousness: awake and alert Pain management: pain level controlled Vital Signs Assessment: post-procedure vital signs reviewed and stable Respiratory status: spontaneous breathing, nonlabored ventilation, respiratory function stable and patient connected to nasal cannula oxygen Cardiovascular status: blood pressure returned to baseline and stable Postop Assessment: no apparent nausea or vomiting Anesthetic complications: no   No notable events documented.  Last Vitals:  Vitals:   12/04/20 1730 12/04/20 1740  BP: 120/71   Pulse: 90 88  Resp: 15 15  Temp:    SpO2: 100% 100%    Last Pain:  Vitals:   12/04/20 1300  TempSrc: Rectal  PainSc:                  [name]

## 2020-12-04 NOTE — ED Notes (Signed)
This writer went into pt room for discharge and asked how he was going to get a ride home. Pt responded that his daughter would call him when she got off work at [employer]. When questioned further, pt began to describe multiple different methods of getting home and became combative. Pt attempted to pull out catheter. Informed Rutha Bouchard PA and [place] DO of pt change in status. Updated VS as well as rectal temp obtained. Pt continuied to be combative and an order for soft restraints was obtained.

## 2020-12-04 NOTE — Interval H&P Note (Signed)
History and Physical Interval Note:  12/04/2020 4:39 PM  Corey Cummings  has presented today for surgery, with the diagnosis of RIGHT URETERAL STONE.  The various methods of treatment have been discussed with the patient and family. After consideration of risks, benefits and other options for treatment, the patient has consented to  Procedure(s): CYSTOSCOPY WITH RETROGRADE PYELOGRAM/URETERAL STENT PLACEMENT (Right) as a surgical intervention.  The patient's history has been reviewed, patient examined, no change in status, stable for surgery.  I have reviewed the patient's chart and labs.  Questions were answered to the patient's satisfaction.     Jannifer Hick

## 2020-12-04 NOTE — Progress Notes (Addendum)
Pharmacy Antibiotic Note  Corey Cummings is a 87 y.o. male admitted on 12/04/2020 with  urosepsis .  Pharmacy has been consulted for vancomycin and cefepime dosing.  Received cefepime 2g IV at 1418, received Ancef pre-op at 1641. Most recent weight 68kg in 02/2020.  Est CrCl to be ~32 mL/min.  With this, patient would qualify for cefepime 2g IV q12 hours.    Plan: Vancomycin 1500 mg IV x1  Hold off on ordering maintenance vanc doses until weight can be updated Cefepime 2g IV x1 received at 1418 Begin cefepime 2g IV q12 hours starting 8/29 @ 0200 F/u culture data, clinical improvement, ability to de-escalate abx Vanc levels as needed    Temp (24hrs), Avg:100 F (37.8 C), Min:97.6 F (36.4 C), Max:102.3 F (39.1 C)  Recent Labs  Lab 12/04/20 0921 12/04/20 1418  WBC 11.9*  --   CREATININE 1.55*  --   LATICACIDVEN  --  3.5*    CrCl cannot be calculated (Unknown ideal weight.).    No Known Allergies  Antimicrobials this admission: Vancomycin 8/28 >>  Cefepime 8/28 >>   Dose adjustments this admission: N/a  Microbiology results: 8/28 BCx: sent 8/28 UCx: sent    Thank you for allowing pharmacy to be a part of this patient's care.  Rexford Maus 12/04/2020 5:06 PM

## 2020-12-04 NOTE — Sepsis Progress Note (Signed)
Still nothing done to address sepsis protocol. Secure message sent to bedside RN help address any barriers preventing sepsis protocol from being carried out

## 2020-12-04 NOTE — Op Note (Addendum)
Operative Note  Preoperative diagnosis:  1. Right distal ureteral stone 2. Bladder outlet obstruction secondary to BPH 3. Bilateral hydronephrosis 4. Right emphysematous pyelonephritis  Postoperative diagnosis: 1. Bladder stone 2. Bladder outlet obstruction secondary to BPH 3. Bilateral hydronephrosis 4. Right emphysematous pyelonephritis  Procedure(s): 1.  Cystoscopy 2. Right retrograde pyelogram with interpretation 3. Right ureteral stent placement 4. Fluoroscopy <1 hour with intraoperative interpretation  Surgeon: Jettie Pagan, MD  Assistants:  Corey Mosher, MD  Anesthesia:  General  Complications:  None  EBL:  3 mL's  Specimens: 1. None  Drains/Catheters: 1.  Right 7Fr x 26cm ureteral stent  Intraoperative findings:   Cystoscopy demonstrated a very enlarged prostate with trilobar hypertrophy, several bladder trabeculation w/ many cellules and diverticulum, large jagged yellow bladder stone manipulated out of his ureteral orifice and in place within a right sided Hutch diverticulum, no suspicious lesions or masses. Right UO clearly visualized and was very dilated and patuolous. Left UO never clearly visualized due to difficult large median lobe.  Right Retrograde pyelogram demonstrated severe hydronephrosis. Successful right ureteral stent placement with curl in the renal pelvis and bladder respectively.  Indication:  Corey Cummings is a 87 y.o. male with Hx of chronic BPH/BOO m/w chronic indwelling catheter, chronic bilateral hydronephrosis, and a 3 cm right UVJ stone who presented to the ED w/ concern for right emphysematous pyelonephritis. After reviewing the management options for treatment, he and his family elected to proceed with the above surgical procedure(s). We have discussed the potential benefits and risks of the procedure, side effects of the proposed treatment, the likelihood of the patient achieving the goals of the procedure, and any potential problems that  might occur during the procedure or recuperation. Informed consent has been obtained.  Description of procedure: The patient was taken to the operating room and general anesthesia was induced.  The patient was placed in the dorsal lithotomy position, prepped and draped in the usual sterile fashion, and preoperative antibiotics were administered. A preoperative time-out was performed.   Cystourethroscopy was performed.  The patient's urethra was examined and was normal/ There was some trilobar prostatic hypertrophy with a large intravesical median lobe. The bladder was then systematically examined in its entirety demonstrating the above findings.  Attention then turned to the right ureteral orifice. A Sensor wire was passed through the right orifice and over the wire a 5 Fr open ended catheter was inserted and passed up to the level of the renal pelvis. Omnipaque contrast was injected through the ureteral catheter and a retrograde pyelogram was performed with findings as dictated above. The wire was then replaced and the open ended catheter was removed.   A 7Fr x 26cm ureteral stent was advance over the wire. The stent was positioned appropriately under fluoroscopic and cystoscopic guidance.  The wire was then removed with an adequate stent curl noted in the renal pelvis as well as in the bladder.  The bladder was then emptied and the procedure ended.  An 29F Coude catheter was replaced with 10 mL's in the balloon. The patient appeared to tolerate the procedure well and without complications.  The patient was able to be awakened and transferred to the recovery unit in satisfactory condition.   Plan:   - Continue right ureteral stent and Foley catheter - Continue broad spectrum antibiotics and tailor per culture results - Patient will follow up with [facility] as an outpatient  Corey Mosher, MD [facility] Urology Resident  ---------------------------------  Corey Kid. Corey Patella MD [facility]   Pager: [phone]

## 2020-12-04 NOTE — ED Triage Notes (Signed)
Pt BIB [facility] from home c/o dysuria since this morning. Hx kidney stones ~6w ago that supposedly broke up. Endorses back pain on right side rated 7/10. Denies fever, NV. A&Ox4. Took aspirin.  BP 166/98 HR 112 SpO2 99% RA Temp 97.6

## 2020-12-04 NOTE — Anesthesia Procedure Notes (Signed)
Procedure Name: LMA Insertion Date/Time: 12/04/2020 4:39 PM Performed by: Minerva Ends, CRNA Pre-anesthesia Checklist: Patient identified, Emergency Drugs available, Suction available and Patient being monitored Patient Re-evaluated:Patient Re-evaluated prior to induction Oxygen Delivery Method: Circle System Utilized Preoxygenation: Pre-oxygenation with 100% oxygen Induction Type: IV induction Ventilation: Mask ventilation without difficulty LMA: LMA inserted and LMA with gastric port inserted LMA Size: 4.0 Number of attempts: 1 Airway Equipment and Method: Bite block Placement Confirmation: positive ETCO2 Tube secured with: Tape Dental Injury: Teeth and Oropharynx as per pre-operative assessment  Comments: Smooth IV induction AM CRNA- LMA inserted by Witman - atraumatic- no teeth present as preop- bilat BS

## 2020-12-04 NOTE — Anesthesia Procedure Notes (Signed)
Date/Time: 12/04/2020 5:04 PM Performed by: Minerva Ends, CRNA Oxygen Delivery Method: Simple face mask Placement Confirmation: positive ETCO2 and breath sounds checked- equal and bilateral Dental Injury: Teeth and Oropharynx as per pre-operative assessment

## 2020-12-04 NOTE — H&P (View-Only) (Signed)
Urology Consult Note   Requesting Attending Physician:  Virgina Norfolk, DO Service Providing Consult: Urology   Reason for Consult:  Nephrolithiasis, Hydronephrosis, Sepsis  HPI: Corey Cummings is seen in consultation for reasons noted above at the request of Virgina Norfolk, DO for evaluation of 3 cm right UVJ stone causing severe hydronephrosis w/ debris and gas and signs/symptoms concerning for urosepsis.  He presented to the ED today with complaints of right flank pain and increased discomfort/urgency from his chronic indwelling Foley catheter (last exchanged 11/29/20 in Urology clinic). In the ED he was febrile, tachycardic, and became more confused. His Foley catheter was exchanged and a new urine sample demonstrated likely UTI. His vital abnormalities persisted, so CT scan was obtained which demonstrated 3 cm right UVJ stone causing severe hydronephrosis w/ debris and gas and signs/symptoms concerning for urosepsis.  He was started on sepsis protocol and Vanc/Cefepime was initiated.   He is followed by Dr. Marlou Porch w/ [facility]. Given that his right kidney is atrophic and non-functioning and that he is asymptomatic from his large right UVJ stone, this is being managed conservatively with observation.   Past Medical History: Past Medical History:  Diagnosis Date   BPH (benign prostatic hyperplasia)    Hypertension     Past Surgical History:  Past Surgical History:  Procedure Laterality Date   APPENDECTOMY      Medication: Current Facility-Administered Medications  Medication Dose Route Frequency Provider Last Rate Last Admin   acetaminophen (TYLENOL) tablet 1,000 mg  1,000 mg Oral Once Virgina Norfolk, DO       Current Outpatient Medications  Medication Sig Dispense Refill   finasteride (PROSCAR) 5 MG tablet Take 5 mg by mouth daily.     Iron-FA-B Cmp-C-Biot-Probiotic (FUSION PLUS) CAPS Take 1 capsule by mouth daily.     olmesartan (BENICAR) 20 MG tablet Take 20 mg by mouth  daily.     RIVAROXABAN (XARELTO) VTE STARTER PACK (15 & 20 MG) Follow package directions: Take one 15mg  tablet by mouth twice a day. On day 22, switch to one 20mg  tablet once a day. Take with food. (Patient taking differently: Take 20 mg by mouth daily.) 51 each 0   tamsulosin (FLOMAX) 0.4 MG CAPS capsule Take 0.4 mg by mouth daily.      Allergies: No Known Allergies  Social History: Social History   Tobacco Use   Smoking status: Former    Types: Cigarettes   Tobacco comments:    Quit 40 years ago    Family History No family history on file.  Review of Systems 10 systems were reviewed and are negative except as noted specifically in the HPI.  Objective   Vital signs in last 24 hours: BP 140/83   Pulse (!) 114   Temp (!) 102.3 F (39.1 C) (Rectal)   Resp (!) 23   SpO2 98%   Physical Exam General: NAD, A&O, resting, appropriate HEENT: [place]/AT, EOMI, MMM Pulmonary: Normal work of breathing Cardiovascular: HDS, adequate peripheral perfusion Abdomen: Soft, NTTP, nondistended. GU: Foley catheter in place draining thin, clear red urine Extremities: warm and well perfused Neuro: Appropriate, no focal neurological deficits  Most Recent Labs: Lab Results  Component Value Date   WBC 11.9 (H) 12/04/2020   HGB 10.6 (L) 12/04/2020   HCT 34.1 (L) 12/04/2020   PLT 355 12/04/2020    Lab Results  Component Value Date   NA 137 12/04/2020   K 3.9 12/04/2020   CL 103 12/04/2020   CO2 23  12/04/2020   BUN 26 (H) 12/04/2020   CREATININE 1.55 (H) 12/04/2020   CALCIUM 9.1 12/04/2020    Lab Results  Component Value Date   INR 1.8 (H) 12/04/2020   APTT 35 12/04/2020     IMAGING:   ------  Assessment:  87 y.o. male with Hx of chronic BOO m/w indwelling foley catheter w/ bilateral chronic hydronephrosis and a known 3 cm right UVJ stone who presented with right flank pain, fevers, and confusion and CT that demonstrates 30mm right obstructing stone with associated  hydronephrosis as well as gas and debris in the right renal collecting system..   Concerning for infection given: Tmax 102.3, tachycardia, UA w/ positive LE/nitrites In the ED received: Vanc/Cefepime  We discussed the indications for acute intervention including infected obstruction, bilateral ureteral obstruction or unilateral obstruction of solitary kidney as well as other less urgent indications for decompression which would included intractable pain, N/V, and acute renal injury. We also discussed the possible inability to place a ureteral stent in which case, the next intervention recommended would be a percutaneous nephrostomy tube. Given that infected obstructing stone is present, plan for right ureteral stent placement.   Recommendations: Plan for emergent right ureteral stent placement Case posted; Urology will discuss with anesthesia Keep patient NPO Consent obtained from Daughter Continue broad spectrum antibiotics   Thank you for this consult. Please contact the urology consult pager with any further questions/concerns.  I have seen and examined the patient and agree with the above assessment and plan.  Right emphysematous pyelitis Chronically obstructed right hydronephrotic kidney Left hydronephrosis without ureteral stone likely from reflux from malpositioned foley catheter Foley catheter malpositioned  -The risks, benefits and alternatives of cystoscopy with right JJ stent placement was discussed with the patient.  Risks include, but are not limited to: bleeding, urinary tract infection, ureteral injury, ureteral stricture disease, chronic pain, urinary symptoms, bladder injury, stent migration, the need for nephrostomy tube placement, MI, CVA, DVT, PE and the inherent risks with general anesthesia.  The patient voices understanding and wishes to proceed.  -If unable to place right ureteral stent, will plan for right PCN -Will place foley for maximal decompression -Will  notify Dr. Herrick of his admission. Will defer definitive stone treatment electively.  Matt R. Estella Malatesta MD [facility]  Pager: [phone]     

## 2020-12-04 NOTE — Sepsis Progress Note (Signed)
Elink following sepsis 

## 2020-12-04 NOTE — Discharge Instructions (Addendum)
Please take antibiotic as prescribed for urine infection.  You do have a hernia in your right lower abdomen as discussed.  If you feel that this is getting bigger/worse/more painful/any overlying color changes please come for reevaluation.

## 2020-12-04 NOTE — ED Notes (Signed)
OR Nursing staff to ED to transport pt to PACU

## 2020-12-04 NOTE — Anesthesia Preprocedure Evaluation (Addendum)
Anesthesia Evaluation  Patient identified by MRN, date of birth, ID band Patient awake    Reviewed: Allergy & Precautions, NPO status , Patient's Chart, lab work & pertinent test results  History of Anesthesia Complications Negative for: history of anesthetic complications  Airway Mallampati: II  TM Distance: >3 FB Neck ROM: Full    Dental  (+) Upper Dentures, Lower Dentures, Dental Advisory Given   Pulmonary former smoker, PE (02/2020)   Pulmonary exam normal        Cardiovascular hypertension, + DVT  Normal cardiovascular exam   Echo 02/24/20: EF 55-60%, hyperdynamic RV with McConnell's sign suspicious for PE, mod pulm HTN (RVSP 58), normal MV, normal AV, mod TR   Neuro/Psych negative neurological ROS     GI/Hepatic negative GI ROS, Neg liver ROS,   Endo/Other  negative endocrine ROS  Renal/GU Renal Insufficiency and CRFRenal disease ( R ureteral stone; CKD; Cr 1.55)   BPH    Musculoskeletal negative musculoskeletal ROS (+)   Abdominal   Peds  Hematology  (+) anemia , Xarelto   Anesthesia Other Findings  Pt presented with abdominal pain and signs of sepsis. Has indwelling foley catheter due to BPH. Staghorn calculus found on CT scan.  Reproductive/Obstetrics                           Anesthesia Physical Anesthesia Plan  ASA: 3 and emergent  Anesthesia Plan: General   Post-op Pain Management:    Induction: Intravenous  PONV Risk Score and Plan: 2 and Ondansetron, Dexamethasone, Treatment may vary due to age or medical condition and Midazolam  Airway Management Planned: Oral ETT and LMA  Additional Equipment: None  Intra-op Plan:   Post-operative Plan: Extubation in OR  Informed Consent: I have reviewed the patients History and Physical, chart, labs and discussed the procedure including the risks, benefits and alternatives for the proposed anesthesia with the patient or  authorized representative who has indicated his/her understanding and acceptance.     Dental advisory given and Consent reviewed with POA  Plan Discussed with:   Anesthesia Plan Comments: (Plan discussed with both patient and daughter and both are in agreement.)       Anesthesia Quick Evaluation

## 2020-12-04 NOTE — H&P (Signed)
History and Physical    Corey KeRobert Landino ZOX:[id number] DOB: 1930-07-22 DOA: 12/04/2020  PCP: Mila PalmerWolters, Sharon, MD Patient coming from: home  Chief Complaint: Flank pain, dysuria, urinary frequency and urgency  HPI: Corey Cummings is a 87 y.o. male with medical history significant of chronic Foley catheter, DVT/PE on Xarelto, BPH, HTN, who presented with flank pain, dysuria, urinary frequency and urgency.  Patient reports that he started to have right-sided flank pain, dysuria, urinary) and urgency last night.  His flank pain was constant, sharp pain, severe pain radiating to the right side abdomen area.  No alleviating or aggravating factors.  He denies fevers, chills, nausea, vomiting, diarrhea.  Denies shortness of breath, cough, wheezing, chest pain.  Patient came to ED for further evaluation.  He was initially afebrile with pulse 108, RR 16 and a BP 207/116.  He then spiked a fever of 102.49F and became confused.  The labs showed WBC 11.9, lactic acid 3.5, BUN 26, creatinine 1.55, INR 1.8, pyuria.   Renal CT showed Foley balloon is deployed within the prostate gland.  Right renal pelvis and proximal right ureter gas-forming infection/pyonephritis.  Chronic severe right-sided hydronephrosis with associated obstructing 3 cm staghorn calculus again seen at the RIGHT UVJ (also seen on earlier CT of 02/24/2020). The LEFT-sided hydronephrosis is increased. RIGHT inguinal hernia which contains loops of small bowel, but no evidence of associated bowel obstruction or inflammation at the inguinal hernia site. Overall, no bowel obstruction or evidence of bowel Bauserman inflammation.  Patient was given NS bolus, and cefepime at the ED. Urology is consulted and patient is currently being taken to OR.    Review of Systems: As per HPI otherwise 10 point review of systems negative.  Review of Systems Otherwise negative except as per HPI, including: General: Denies night sweats or unintended weight loss.  Positive for  fevers and chills Resp: Denies cough, wheezing, shortness of breath. Cardiac: Denies chest pain, palpitations, orthopnea, paroxysmal nocturnal dyspnea. GI: Denies abdominal pain, nausea, vomiting, diarrhea or constipation GU: Positive for dysuria, frequency, hesitancy or incontinence.  Positive for right flank pain.   MS: Denies muscle aches, joint pain or swelling Neuro: Denies headache, neurologic deficits (focal weakness, numbness, tingling), abnormal gait.  Positive for confusion Psych: Denies anxiety, depression, SI/HI/AVH Skin: Denies new rashes or lesions ID: Denies sick contacts, exotic exposures, travel  Past Medical History:  Diagnosis Date   BPH (benign prostatic hyperplasia)    Hypertension     Past Surgical History:  Procedure Laterality Date   APPENDECTOMY      SOCIAL HISTORY:  reports that he has quit smoking. His smoking use included cigarettes. He has never used smokeless tobacco. He reports that he does not currently use alcohol. He reports that he does not currently use drugs.  No Known Allergies  FAMILY HISTORY: No family history on file.   Prior to Admission medications   Medication Sig Start Date End Date Taking? Authorizing Provider  finasteride (PROSCAR) 5 MG tablet Take 5 mg by mouth daily. 01/12/20  Yes [provider]  Iron-FA-B Cmp-C-Biot-Probiotic (FUSION PLUS) CAPS Take 1 capsule by mouth daily. 11/28/20  Yes [provider]  olmesartan (BENICAR) 20 MG tablet Take 20 mg by mouth daily. 09/09/19  Yes [provider]  RIVAROXABAN Carlena Hurl(XARELTO) VTE STARTER PACK (15 & 20 MG) Follow package directions: Take one 15mg  tablet by mouth twice a day. On day 22, switch to one 20mg  tablet once a day. Take with food. Patient taking differently: Take 20  mg by mouth daily. 02/27/20  Yes Arrien, York Ram, MD  tamsulosin (FLOMAX) 0.4 MG CAPS capsule Take 0.4 mg by mouth daily. 10/20/20  Yes [provider]    Physical  Exam: Vitals:   12/04/20 1500 12/04/20 1515 12/04/20 1530 12/04/20 1545  BP: 118/74 140/83 124/79 (!) 152/94  Pulse: (!) 103 (!) 114 (!) 106 (!) 116  Resp: 20 (!) 23 (!) 21 (!) 24  Temp:      TempSrc:      SpO2: 95% 98% 95% 97%      Constitutional: Acutely ill-appearing. Eyes: PERRL, lids and conjunctivae normal ENMT: Mucous membranes are moist. Posterior pharynx clear of any exudate or lesions.Normal dentition.  Neck: normal, supple, no masses, no thyromegaly Respiratory: clear to auscultation bilaterally, no wheezing, no crackles. Normal respiratory effort. No accessory muscle use.  Cardiovascular: Regular rate and rhythm, no murmurs / rubs / gallops. No extremity edema. 2+ pedal pulses. No carotid bruits.  Abdomen: no tenderness, no masses palpated. No hepatosplenomegaly. Bowel sounds positive.  Positive for CVA tenderness to right side Musculoskeletal: no clubbing / cyanosis. No joint deformity upper and lower extremities. Good ROM, no contractures. Normal muscle tone.  Skin: no rashes, lesions, ulcers. No induration Neurologic: CN 2-12 grossly intact. Sensation intact, DTR normal. Strength 5/5 in all 4.  Psychiatric: Normal judgment and insight. Alert and oriented x 3. Normal mood.     Labs on Admission: I have personally reviewed following labs and imaging studies  CBC: Recent Labs  Lab 12/04/20 0921  WBC 11.9*  HGB 10.6*  HCT 34.1*  MCV 82.6  PLT 355   Basic Metabolic Panel: Recent Labs  Lab 12/04/20 0921  [facility] 137  K 3.9  CL 103  CO2 23  GLUCOSE 201*  BUN 26*  CREATININE 1.55*  CALCIUM 9.1   GFR: CrCl cannot be calculated (Unknown ideal weight.). Liver Function Tests: No results for input(s): AST, ALT, ALKPHOS, BILITOT, PROT, ALBUMIN in the last 168 hours. No results for input(s): LIPASE, AMYLASE in the last 168 hours. No results for input(s): AMMONIA in the last 168 hours. Coagulation Profile: Recent Labs  Lab 12/04/20 1418  INR 1.8*   Cardiac  Enzymes: No results for input(s): CKTOTAL, CKMB, CKMBINDEX, TROPONINI in the last 168 hours. BNP (last 3 results) No results for input(s): PROBNP in the last 8760 hours. HbA1C: No results for input(s): HGBA1C in the last 72 hours. CBG: No results for input(s): GLUCAP in the last 168 hours. Lipid Profile: No results for input(s): CHOL, HDL, LDLCALC, TRIG, CHOLHDL, LDLDIRECT in the last 72 hours. Thyroid Function Tests: No results for input(s): TSH, T4TOTAL, FREET4, T3FREE, THYROIDAB in the last 72 hours. Anemia Panel: No results for input(s): VITAMINB12, FOLATE, FERRITIN, TIBC, IRON, RETICCTPCT in the last 72 hours. Urine analysis:    Component Value Date/Time   COLORURINE YELLOW 12/04/2020 1203   APPEARANCEUR CLOUDY (A) 12/04/2020 1203   LABSPEC 1.009 12/04/2020 1203   PHURINE 6.0 12/04/2020 1203   GLUCOSEU NEGATIVE 12/04/2020 1203   HGBUR LARGE (A) 12/04/2020 1203   BILIRUBINUR NEGATIVE 12/04/2020 1203   KETONESUR NEGATIVE 12/04/2020 1203   PROTEINUR 100 (A) 12/04/2020 1203   NITRITE POSITIVE (A) 12/04/2020 1203   LEUKOCYTESUR LARGE (A) 12/04/2020 1203   Sepsis Labs: !!!!!!!!!!!!!!!!!!!!!!!!!!!!!!!!!!!!!!!!!!!! @LABRCNTIP (procalcitonin:4,lacticidven:4) ) Recent Results (from the past 240 hour(s))  Resp Panel by RT-PCR (Flu A&B, Covid) Nasopharyngeal Swab     Status: None   Collection Time: 12/04/20  3:12 PM   Specimen: Nasopharyngeal Swab; Nasopharyngeal(NP)  swabs in vial transport medium  Result Value Ref Range Status   SARS Coronavirus 2 by RT PCR NEGATIVE NEGATIVE Final    Comment: (NOTE) SARS-CoV-2 target nucleic acids are NOT DETECTED.  The SARS-CoV-2 RNA is generally detectable in upper respiratory specimens during the acute phase of infection. The lowest concentration of SARS-CoV-2 viral copies this assay can detect is 138 copies/mL. A negative result does not preclude SARS-Cov-2 infection and should not be used as the sole basis for treatment or other patient  management decisions. A negative result may occur with  improper specimen collection/handling, submission of specimen other than nasopharyngeal swab, presence of viral mutation(s) within the areas targeted by this assay, and inadequate number of viral copies(<138 copies/mL). A negative result must be combined with clinical observations, patient history, and epidemiological information. The expected result is Negative.  Fact Sheet for Patients:  BloggerCourse.com  Fact Sheet for Healthcare Providers:  SeriousBroker.it  This test is no t yet approved or cleared by the [place] FDA and  has been authorized for detection and/or diagnosis of SARS-CoV-2 by FDA under an Emergency Use Authorization (EUA). This EUA will remain  in effect (meaning this test can be used) for the duration of the COVID-19 declaration under Section 564(b)(1) of the Act, 21 U.S.C.section 360bbb-3(b)(1), unless the authorization is terminated  or revoked sooner.       Influenza A by PCR NEGATIVE NEGATIVE Final   Influenza B by PCR NEGATIVE NEGATIVE Final    Comment: (NOTE) The Xpert Xpress SARS-CoV-2/FLU/RSV plus assay is intended as an aid in the diagnosis of influenza from Nasopharyngeal swab specimens and should not be used as a sole basis for treatment. Nasal washings and aspirates are unacceptable for Xpert Xpress SARS-CoV-2/FLU/RSV testing.  Fact Sheet for Patients: BloggerCourse.com  Fact Sheet for Healthcare Providers: SeriousBroker.it  This test is not yet approved or cleared by the [place] FDA and has been authorized for detection and/or diagnosis of SARS-CoV-2 by FDA under an Emergency Use Authorization (EUA). This EUA will remain in effect (meaning this test can be used) for the duration of the COVID-19 declaration under Section 564(b)(1) of the Act, 21 U.S.C. section 360bbb-3(b)(1),  unless the authorization is terminated or revoked.  Performed at [facility], [address], [place], [place] [address]      Radiological Exams on Admission: DG Chest Port 1 View  Result Date: 12/04/2020 CLINICAL DATA:  Questionable sepsis - evaluate for abnormality EXAM: PORTABLE CHEST 1 VIEW COMPARISON:  February 23, 2020 FINDINGS: The cardiomediastinal silhouette is unchanged in the enlarged in contour.Atherosclerotic calcifications. Mild elevation of the RIGHT hemidiaphragm. No pleural effusion. No pneumothorax. No acute pleuroparenchymal abnormality. Visualized abdomen is unremarkable. Multilevel degenerative changes of the thoracic spine. IMPRESSION: No acute cardiopulmonary abnormality. Electronically Signed   By: Meda Klinefelter M.D.   On: 12/04/2020 15:05   CT RENAL STONE STUDY  Result Date: 12/04/2020 CLINICAL DATA:  Flank pain. EXAM: CT ABDOMEN AND PELVIS WITHOUT CONTRAST TECHNIQUE: Multidetector CT imaging of the abdomen and pelvis was performed following the standard protocol without IV contrast. COMPARISON:  CT abdomen dated 02/24/2020 FINDINGS: Lower chest: Mild bibasilar atelectasis. Hepatobiliary: No focal liver abnormality is seen. Gallbladder is unremarkable, decompressed. No bile duct dilatation is seen. Pancreas: Unremarkable. No pancreatic ductal dilatation or surrounding inflammatory changes. Spleen: Normal in size without focal abnormality. Adrenals/Urinary Tract: Adrenal glands appear normal. Horseshoe kidney morphology. Severe bilateral hydronephrosis, RIGHT greater than LEFT. Staghorn calculus is present at the RIGHT UVJ,  measuring 3.1 cm. This UVJ stone was present on earlier CT of 02/24/2020. The severe RIGHT-sided hydroureter and hydronephrosis is not appreciably changed in extent. However, there is new layering hyperdense material within the renal pelvis, a new small focus of air within the RIGHT renal pelvis, and new perinephric fluid  stranding/inflammation suggesting superimposed gas-forming infection/pyelonephritis. The LEFT-sided hydronephrosis is worsened. No layering debris, error or other signs LEFT-sided infection/pyelonephritis. No LEFT-sided ureteral stone. Asymmetric thickening of the RIGHT lateral bladder Obi, raising the possibility of cystitis. Foley catheter in place, but balloon is deployed at the level of the prostate gland. Stomach/Bowel: No dilated large or small bowel loops. No evidence of bowel Hearn inflammation. Diverticulosis throughout the colon, most extensive within the sigmoid and descending colon, but no focal inflammatory change to suggest acute diverticulitis. Stomach is unremarkable. RIGHT inguinal hernia which contains loops of small bowel, but no evidence of associated bowel obstruction or inflammation at the inguinal hernia site. Appendix is not convincingly seen but there are no inflammatory changes about the cecum to suggest acute appendicitis. Vascular/Lymphatic: Aortic atherosclerosis. Scattered mildly prominent lymph nodes within the abdomen and pelvis, likely reactive in nature. Reproductive: Prostate gland is moderately prominent in size. Other: No layering free fluid within the abdomen or pelvis. No free intraperitoneal air. Musculoskeletal: No acute-appearing osseous abnormality. Old compression fracture deformity of the L1 vertebral body, mild to moderate in degree. Mild degenerative spondylosis of the thoracolumbar spine. IMPRESSION: 1. Horseshoe kidney morphology. 2. Severe RIGHT-sided hydronephrosis, not appreciably changed in severity, with associated obstructing 3 cm staghorn calculus again seen at the RIGHT UVJ (also seen on earlier CT of 02/24/2020). 3. However, there is NEW layering hyperdense material within the renal pelvis, new foci of air within the RIGHT renal pelvis and proximal RIGHT ureter, and new perinephric fluid stranding/inflammation indicating superimposed gas-forming  infection/pyelonephritis. 4. The LEFT-sided hydronephrosis is increased. However, there is no obstructing ureteral stone on the LEFT and no secondary signs of infection/pyelonephritis on the LEFT. 5. Asymmetric thickening of the RIGHT lateral bladder Alcantar, raising the possibility of associated cystitis. 6. Please note that the Foley balloon is deployed within the prostate gland. Recommend repositioning. 7. RIGHT inguinal hernia which contains loops of small bowel, but no evidence of associated bowel obstruction or inflammation at the inguinal hernia site. Overall, no bowel obstruction or evidence of bowel Shorten inflammation. 8. Colonic diverticulosis without evidence of acute diverticulitis. Please note that the Foley balloon is deployed within the prostate gland. Recommend repositioning. Aortic Atherosclerosis (ICD10-I70.0). Electronically Signed   By: Bary Richard M.D.   On: 12/04/2020 09:41     All images have been reviewed by me personally.    Assessment/Plan Principal Problem:   Severe sepsis (HCC) Active Problems:   Ureteral obstruction, right   Hydronephrosis with urinary obstruction due to ureteral calculus   Pulmonary embolism (HCC)   Lactic acidosis   Acute cystitis without hematuria   Foley catheter problem (HCC)   Unilateral recurrent inguinal hernia without obstruction or gangrene   Acute pyelonephritis   BPH (benign prostatic hyperplasia)   Chronic indwelling Foley catheter   Hypertensive urgency   CKD (chronic kidney disease), stage III (HCC)   Assessment Plan  #Severe sepsis #Lactic acidosis #Right gas-forming pyelonephritis #Acute catheter associated UTI #Chronic Foley catheter #BPH -Telemetry monitoring -Blood and urine cultures are pending -Lactic acid 3.5 -Sepsis protocol-MAP >65 - Abx-broad-spectrum antibiotics vancomycin and cefepime -Renal CT reviewed -Neurologist consulted and the patient is currently being taken to the OR for  stent placement   # chronic  Foley balloon was noted to be deployed within the prostate gland on CT scan #   Chronic severe right-sided hydronephrosis with associated obstructing 3 cm staghorn calculus again seen at the RIGHT UVJ  #Worsening left-sided hydronephrosis  -Foley catheter exchanged by ED provider -Urologist consulted  #History of DVT/PE on Xarelto -INR 1.8 on admission -Hold Xarelto given urology procedure -We will check B/L LE Doppler study. If no DVT, we could hold Xarelto longer during this admission   #Hypertensive urgency due to pain #HTN -Improved with pain management -Chronic and stable   #CKD stage III, baseline creatinine 1.5 -BUN 26 and a creatinine 1.55 on admission, which are at his baseline -Renal CT noted -Monitor BMP  #Reducible inguinal hernia noted on CT scan -Reducible       DVT prophylaxis: SCD Code Status: Full code Family Communication: Daughter Consults called: urology Admission status: Inpatient Status is: Inpatient  Remains inpatient appropriate because:Hemodynamically unstable  Dispo: The patient is from: Home              Anticipated d/c is to: Home              Patient currently is not medically stable to d/c.   Difficult to place patient No       Time Spent: 65 minutes.  >50% of the time was devoted to discussing the patients care, assessment, plan and disposition with other care givers along with counseling the patient about the risks and benefits of treatment.    Dede Query MD [facility]  If 7PM-7AM, please contact night-coverage   12/04/2020, 4:55 PM

## 2020-12-04 NOTE — Consult Note (Addendum)
Urology Consult Note   Requesting Attending Physician:  Virgina Norfolk, DO Service Providing Consult: Urology   Reason for Consult:  Nephrolithiasis, Hydronephrosis, Sepsis  HPI: Corey Cummings is seen in consultation for reasons noted above at the request of Virgina Norfolk, DO for evaluation of 3 cm right UVJ stone causing severe hydronephrosis w/ debris and gas and signs/symptoms concerning for urosepsis.  He presented to the ED today with complaints of right flank pain and increased discomfort/urgency from his chronic indwelling Foley catheter (last exchanged 11/29/20 in Urology clinic). In the ED he was febrile, tachycardic, and became more confused. His Foley catheter was exchanged and a new urine sample demonstrated likely UTI. His vital abnormalities persisted, so CT scan was obtained which demonstrated 3 cm right UVJ stone causing severe hydronephrosis w/ debris and gas and signs/symptoms concerning for urosepsis.  He was started on sepsis protocol and Vanc/Cefepime was initiated.   He is followed by Dr. Marlou Porch w/ [facility]. Given that his right kidney is atrophic and non-functioning and that he is asymptomatic from his large right UVJ stone, this is being managed conservatively with observation.   Past Medical History: Past Medical History:  Diagnosis Date   BPH (benign prostatic hyperplasia)    Hypertension     Past Surgical History:  Past Surgical History:  Procedure Laterality Date   APPENDECTOMY      Medication: Current Facility-Administered Medications  Medication Dose Route Frequency Provider Last Rate Last Admin   acetaminophen (TYLENOL) tablet 1,000 mg  1,000 mg Oral Once Virgina Norfolk, DO       Current Outpatient Medications  Medication Sig Dispense Refill   finasteride (PROSCAR) 5 MG tablet Take 5 mg by mouth daily.     Iron-FA-B Cmp-C-Biot-Probiotic (FUSION PLUS) CAPS Take 1 capsule by mouth daily.     olmesartan (BENICAR) 20 MG tablet Take 20 mg by mouth  daily.     RIVAROXABAN (XARELTO) VTE STARTER PACK (15 & 20 MG) Follow package directions: Take one 15mg  tablet by mouth twice a day. On day 22, switch to one 20mg  tablet once a day. Take with food. (Patient taking differently: Take 20 mg by mouth daily.) 51 each 0   tamsulosin (FLOMAX) 0.4 MG CAPS capsule Take 0.4 mg by mouth daily.      Allergies: No Known Allergies  Social History: Social History   Tobacco Use   Smoking status: Former    Types: Cigarettes   Tobacco comments:    Quit 40 years ago    Family History No family history on file.  Review of Systems 10 systems were reviewed and are negative except as noted specifically in the HPI.  Objective   Vital signs in last 24 hours: BP 140/83   Pulse (!) 114   Temp (!) 102.3 F (39.1 C) (Rectal)   Resp (!) 23   SpO2 98%   Physical Exam General: NAD, A&O, resting, appropriate HEENT: [place]/AT, EOMI, MMM Pulmonary: Normal work of breathing Cardiovascular: HDS, adequate peripheral perfusion Abdomen: Soft, NTTP, nondistended. GU: Foley catheter in place draining thin, clear red urine Extremities: warm and well perfused Neuro: Appropriate, no focal neurological deficits  Most Recent Labs: Lab Results  Component Value Date   WBC 11.9 (H) 12/04/2020   HGB 10.6 (L) 12/04/2020   HCT 34.1 (L) 12/04/2020   PLT 355 12/04/2020    Lab Results  Component Value Date   NA 137 12/04/2020   K 3.9 12/04/2020   CL 103 12/04/2020   CO2 23  12/04/2020   BUN 26 (H) 12/04/2020   CREATININE 1.55 (H) 12/04/2020   CALCIUM 9.1 12/04/2020    Lab Results  Component Value Date   INR 1.8 (H) 12/04/2020   APTT 35 12/04/2020     IMAGING:   ------  Assessment:  87 y.o. male with Hx of chronic BOO m/w indwelling foley catheter w/ bilateral chronic hydronephrosis and a known 3 cm right UVJ stone who presented with right flank pain, fevers, and confusion and CT that demonstrates 22mm right obstructing stone with associated  hydronephrosis as well as gas and debris in the right renal collecting system..   Concerning for infection given: Tmax 102.3, tachycardia, UA w/ positive LE/nitrites In the ED received: Vanc/Cefepime  We discussed the indications for acute intervention including infected obstruction, bilateral ureteral obstruction or unilateral obstruction of solitary kidney as well as other less urgent indications for decompression which would included intractable pain, N/V, and acute renal injury. We also discussed the possible inability to place a ureteral stent in which case, the next intervention recommended would be a percutaneous nephrostomy tube. Given that infected obstructing stone is present, plan for right ureteral stent placement.   Recommendations: Plan for emergent right ureteral stent placement Case posted; Urology will discuss with anesthesia Keep patient NPO Consent obtained from Daughter Continue broad spectrum antibiotics   Thank you for this consult. Please contact the urology consult pager with any further questions/concerns.  I have seen and examined the patient and agree with the above assessment and plan.  Right emphysematous pyelitis Chronically obstructed right hydronephrotic kidney Left hydronephrosis without ureteral stone likely from reflux from malpositioned foley catheter Foley catheter malpositioned  -The risks, benefits and alternatives of cystoscopy with right JJ stent placement was discussed with the patient.  Risks include, but are not limited to: bleeding, urinary tract infection, ureteral injury, ureteral stricture disease, chronic pain, urinary symptoms, bladder injury, stent migration, the need for nephrostomy tube placement, MI, CVA, DVT, PE and the inherent risks with general anesthesia.  The patient voices understanding and wishes to proceed.  -If unable to place right ureteral stent, will plan for right PCN -Will place foley for maximal decompression -Will  notify Dr. Marlou Porch of his admission. Will defer definitive stone treatment electively.  Matt R. Kylo Gavin MD [facility]  Pager: [phone]

## 2020-12-04 NOTE — ED Provider Notes (Addendum)
I personally evaluated the patient during the encounter and completed a history, physical, procedures, medical decision making to contribute to the overall care of the patient and decision making for the patient briefly, the patient is a 87 y.o. male with history of BPH with now chronic Foley catheter.  Presents with abdominal pain, concern for possible urine infection.  Had kidney stone on the right side that had procedure done to it and has been having Foley catheter since.  Foley catheter exchange last week.  He has right-sided inguinal hernia where he is tender that I was able to reduce at the bedside.  Pain has improved but will still get a CT scan to further evaluate for kidney stone or any remaining bowel or fat that may be in the right inguinal area.  Foley catheter appears to be functioning well.  We will check for urine infection.  Please see PA note for further results, evaluation, disposition of the patient.  I will continue to help with medical decision making.  Patient had CT scan that showed ongoing staghorn calculus with obstructive process at the right UVJ 3 cm in size with changes consistent with pyelonephritis urinalysis appears consistent with infection.  He does have mild leukocytosis.  Hernia shows that there is still some small bowel containing hernia but there is no obstructive process or inflammatory changes.  For the most part I believe hernia has been reduced.  Patient reevaluated by nursing staff and found to be more confused.  Rectal temperature was obtained that confirmed a fever of 102.  Patient mildly tachycardic now.  Suspect that patient has catheter associated UTI/pyelonephritis.  Patient was given a dose of Keflex prior to CT scan being done as urinalysis was consistent with infection.  Given abnormal vital signs now will expand work-up to include full sepsis work-up.  Code sepsis called.  We will give a dose of IV cefepime.  Will admit to medicine.  Fluid bolus initiated.   Urology has also been consulted as well.  Dr. Janice Norrie to be made aware with urology team.  This chart was dictated using voice recognition software.  Despite best efforts to proofread,  errors can occur which can change the documentation meaning.     Hernia reduction  Date/Time: 12/04/2020 9:04 AM Performed by: Virgina Norfolk, DO Authorized by: Virgina Norfolk, DO  Consent: Verbal consent obtained. Risks and benefits: risks, benefits and alternatives were discussed Consent given by: patient Patient understanding: patient states understanding of the procedure being performed Patient consent: the patient's understanding of the procedure matches consent given Imaging studies: imaging studies not available Required items: required blood products, implants, devices, and special equipment available Patient identity confirmed: verbally with patient Time out: Immediately prior to procedure a "time out" was called to verify the correct patient, procedure, equipment, support staff and site/side marked as required. Preparation: Patient was prepped and draped in the usual sterile fashion. Local anesthesia used: no  Anesthesia: Local anesthesia used: no  Sedation: Patient sedated: no  Patient tolerance: patient tolerated the procedure well with no immediate complications Comments: Hernia educed    .Critical Care  Date/Time: 12/04/2020 1:27 PM Performed by: Virgina Norfolk, DO Authorized by: Virgina Norfolk, DO   Critical care provider statement:    Critical care time (minutes):  35   Critical care was necessary to treat or prevent imminent or life-threatening deterioration of the following conditions:  Sepsis   Critical care was time spent personally by me on the following activities:  Blood draw  for specimens, development of treatment plan with patient or surrogate, discussions with consultants, discussions with primary provider, evaluation of patient's response to treatment, examination of  patient, interpretation of cardiac output measurements, obtaining history from patient or surrogate, ordering and performing treatments and interventions, ordering and review of laboratory studies, ordering and review of radiographic studies, pulse oximetry, re-evaluation of patient's condition and review of old charts   Care discussed with: admitting provider       EKG Interpretation None            Virgina Norfolk, DO 12/04/20 0905    Virgina Norfolk, DO 12/04/20 1244    Lockie Mola, Bret Vanessen, DO 12/04/20 1320    Lanny Lipkin, DO 12/04/20 1327

## 2020-12-04 NOTE — Transfer of Care (Signed)
Immediate Anesthesia Transfer of Care Note  Patient: [name]  Procedure(s) Performed: CYSTOSCOPY WITH RETROGRADE PYELOGRAM/URETERAL STENT PLACEMENT (Right: Ureter)  Patient Location: PACU  Anesthesia Type:General  Level of Consciousness: sedated  Airway & Oxygen Therapy: Patient Spontanous Breathing and Patient connected to face mask oxygen  Post-op Assessment: Report given to RN and Post -op Vital signs reviewed and stable  Post vital signs: Reviewed and stable  Last Vitals:  Vitals Value Taken Time  BP 139/84 12/04/20 1715  Temp    Pulse 104 12/04/20 1715  Resp 17 12/04/20 1715  SpO2 86 % 12/04/20 1715  Vitals shown include unvalidated device data.  Last Pain:  Vitals:   12/04/20 1300  TempSrc: Rectal  PainSc:          Complications: No notable events documented.

## 2020-12-04 NOTE — Progress Notes (Signed)
A consult was received from an ED physician for cefepime per pharmacy dosing.  The patient's profile has been reviewed for ht/wt/allergies/indication/available labs.   A one time order has been placed for cefepime 2 g per EDP.  Further antibiotics/pharmacy consults should be ordered by admitting physician if indicated.                       Thank you, Valentina Gu 12/04/2020  1:15 PM

## 2020-12-05 ENCOUNTER — Inpatient Hospital Stay (HOSPITAL_COMMUNITY): Payer: Medicare HMO

## 2020-12-05 ENCOUNTER — Other Ambulatory Visit: Payer: Self-pay

## 2020-12-05 ENCOUNTER — Encounter (HOSPITAL_COMMUNITY): Payer: Self-pay | Admitting: Urology

## 2020-12-05 DIAGNOSIS — R609 Edema, unspecified: Secondary | ICD-10-CM | POA: Diagnosis not present

## 2020-12-05 LAB — CBC WITH DIFFERENTIAL/PLATELET
Abs Immature Granulocytes: 0.32 10*3/uL — ABNORMAL HIGH (ref 0.00–0.07)
Basophils Absolute: 0 10*3/uL (ref 0.0–0.1)
Basophils Relative: 0 %
Eosinophils Absolute: 0.1 10*3/uL (ref 0.0–0.5)
Eosinophils Relative: 0 %
HCT: 27 % — ABNORMAL LOW (ref 39.0–52.0)
Hemoglobin: 8.5 g/dL — ABNORMAL LOW (ref 13.0–17.0)
Immature Granulocytes: 2 %
Lymphocytes Relative: 5 %
Lymphs Abs: 1 10*3/uL (ref 0.7–4.0)
MCH: 26.1 pg (ref 26.0–34.0)
MCHC: 31.5 g/dL (ref 30.0–36.0)
MCV: 82.8 fL (ref 80.0–100.0)
Monocytes Absolute: 0.5 10*3/uL (ref 0.1–1.0)
Monocytes Relative: 3 %
Neutro Abs: 18 10*3/uL — ABNORMAL HIGH (ref 1.7–7.7)
Neutrophils Relative %: 90 %
Platelets: 278 10*3/uL (ref 150–400)
RBC: 3.26 MIL/uL — ABNORMAL LOW (ref 4.22–5.81)
RDW: 18.3 % — ABNORMAL HIGH (ref 11.5–15.5)
WBC: 20 10*3/uL — ABNORMAL HIGH (ref 4.0–10.5)
nRBC: 0 % (ref 0.0–0.2)

## 2020-12-05 LAB — BASIC METABOLIC PANEL
Anion gap: 6 (ref 5–15)
BUN: 31 mg/dL — ABNORMAL HIGH (ref 8–23)
CO2: 23 mmol/L (ref 22–32)
Calcium: 8.4 mg/dL — ABNORMAL LOW (ref 8.9–10.3)
Chloride: 110 mmol/L (ref 98–111)
Creatinine, Ser: 1.48 mg/dL — ABNORMAL HIGH (ref 0.61–1.24)
GFR, Estimated: 45 mL/min — ABNORMAL LOW (ref >60)
Glucose, Bld: 133 mg/dL — ABNORMAL HIGH (ref 70–99)
Potassium: 4.2 mmol/L (ref 3.5–5.1)
Sodium: 139 mmol/L (ref 135–145)

## 2020-12-05 LAB — PROTIME-INR
INR: 1.5 — ABNORMAL HIGH (ref 0.8–1.2)
Prothrombin Time: 18 seconds — ABNORMAL HIGH (ref 11.4–15.2)

## 2020-12-05 NOTE — Progress Notes (Addendum)
Urology Progress Note   1 Day Post-Op from right ureteral stent placement.   Subjective: NAEON.  Doing well this morning and feeling slightly better No fevers  Objective: Vital signs in last 24 hours: Temp:  [97.6 F (36.4 C)-102.3 F (39.1 C)] 97.6 F (36.4 C) (08/29 0441) Pulse Rate:  [85-143] 92 (08/29 0441) Resp:  [15-24] 15 (08/29 0441) BP: (118-207)/(71-118) 145/85 (08/29 0441) SpO2:  [79 %-100 %] 98 % (08/29 0441)  Intake/Output from previous day: 08/28 0701 - 08/29 0700 In: 750 [I.V.:700; IV Piggyback:50] Out: 2535 [Urine:2525; Blood:10] Intake/Output this shift: Total I/O In: -  Out: 825 [Urine:825]  Physical Exam:  General: Alert and oriented CV: Regular rate Lungs: No increased work of breathing Abdomen: Soft, non tender. GU: Foley in place draining clear pink tinged urine, no CVA tenderness  Ext: NT, No erythema  Lab Results: Recent Labs    12/04/20 0921 12/05/20 0432  HGB 10.6* 8.5*  HCT 34.1* 27.0*   Recent Labs    12/04/20 0921 12/05/20 0432  NA 137 139  K 3.9 4.2  CL 103 110  CO2 23 23  GLUCOSE 201* 133*  BUN 26* 31*  CREATININE 1.55* 1.48*  CALCIUM 9.1 8.4*    Studies/Results: DG Chest Port 1 View  Result Date: 12/04/2020 CLINICAL DATA:  Questionable sepsis - evaluate for abnormality EXAM: PORTABLE CHEST 1 VIEW COMPARISON:  February 23, 2020 FINDINGS: The cardiomediastinal silhouette is unchanged in the enlarged in contour.Atherosclerotic calcifications. Mild elevation of the RIGHT hemidiaphragm. No pleural effusion. No pneumothorax. No acute pleuroparenchymal abnormality. Visualized abdomen is unremarkable. Multilevel degenerative changes of the thoracic spine. IMPRESSION: No acute cardiopulmonary abnormality. Electronically Signed   By: Meda Klinefelter M.D.   On: 12/04/2020 15:05   DG C-Arm 1-60 Min-No Report  Result Date: 12/04/2020 Fluoroscopy was utilized by the requesting physician.  No radiographic interpretation.   CT  RENAL STONE STUDY  Result Date: 12/04/2020 CLINICAL DATA:  Flank pain. EXAM: CT ABDOMEN AND PELVIS WITHOUT CONTRAST TECHNIQUE: Multidetector CT imaging of the abdomen and pelvis was performed following the standard protocol without IV contrast. COMPARISON:  CT abdomen dated 02/24/2020 FINDINGS: Lower chest: Mild bibasilar atelectasis. Hepatobiliary: No focal liver abnormality is seen. Gallbladder is unremarkable, decompressed. No bile duct dilatation is seen. Pancreas: Unremarkable. No pancreatic ductal dilatation or surrounding inflammatory changes. Spleen: Normal in size without focal abnormality. Adrenals/Urinary Tract: Adrenal glands appear normal. Horseshoe kidney morphology. Severe bilateral hydronephrosis, RIGHT greater than LEFT. Staghorn calculus is present at the RIGHT UVJ, measuring 3.1 cm. This UVJ stone was present on earlier CT of 02/24/2020. The severe RIGHT-sided hydroureter and hydronephrosis is not appreciably changed in extent. However, there is new layering hyperdense material within the renal pelvis, a new small focus of air within the RIGHT renal pelvis, and new perinephric fluid stranding/inflammation suggesting superimposed gas-forming infection/pyelonephritis. The LEFT-sided hydronephrosis is worsened. No layering debris, error or other signs LEFT-sided infection/pyelonephritis. No LEFT-sided ureteral stone. Asymmetric thickening of the RIGHT lateral bladder [place], raising the possibility of cystitis. Foley catheter in place, but balloon is deployed at the level of the prostate gland. Stomach/Bowel: No dilated large or small bowel loops. No evidence of bowel Prom inflammation. Diverticulosis throughout the colon, most extensive within the sigmoid and descending colon, but no focal inflammatory change to suggest acute diverticulitis. Stomach is unremarkable. RIGHT inguinal hernia which contains loops of small bowel, but no evidence of associated bowel obstruction or inflammation at the  inguinal hernia site. Appendix is not convincingly seen  but there are no inflammatory changes about the cecum to suggest acute appendicitis. Vascular/Lymphatic: Aortic atherosclerosis. Scattered mildly prominent lymph nodes within the abdomen and pelvis, likely reactive in nature. Reproductive: Prostate gland is moderately prominent in size. Other: No layering free fluid within the abdomen or pelvis. No free intraperitoneal air. Musculoskeletal: No acute-appearing osseous abnormality. Old compression fracture deformity of the L1 vertebral body, mild to moderate in degree. Mild degenerative spondylosis of the thoracolumbar spine. IMPRESSION: 1. Horseshoe kidney morphology. 2. Severe RIGHT-sided hydronephrosis, not appreciably changed in severity, with associated obstructing 3 cm staghorn calculus again seen at the RIGHT UVJ (also seen on earlier CT of 02/24/2020). 3. However, there is NEW layering hyperdense material within the renal pelvis, new foci of air within the RIGHT renal pelvis and proximal RIGHT ureter, and new perinephric fluid stranding/inflammation indicating superimposed gas-forming infection/pyelonephritis. 4. The LEFT-sided hydronephrosis is increased. However, there is no obstructing ureteral stone on the LEFT and no secondary signs of infection/pyelonephritis on the LEFT. 5. Asymmetric thickening of the RIGHT lateral bladder [place], raising the possibility of associated cystitis. 6. Please note that the Foley balloon is deployed within the prostate gland. Recommend repositioning. 7. RIGHT inguinal hernia which contains loops of small bowel, but no evidence of associated bowel obstruction or inflammation at the inguinal hernia site. Overall, no bowel obstruction or evidence of bowel Nidiffer inflammation. 8. Colonic diverticulosis without evidence of acute diverticulitis.  Please note that the Foley balloon is deployed within the prostate gland. Recommend repositioning. Aortic Atherosclerosis  (ICD10-I70.0). Electronically Signed   By: Bary Richard M.D.   On: 12/04/2020 09:41    Assessment/Plan:  87 y.o. male s/p right ureteral stent placement.  Overall doing well post-op.   - Continue right ureteral stent and Foley catheter to drainage - Continue broad spectrum antibiotics and tailor per culture results. Would typically recommend 10-14 days of culture specific antibiotics in the setting of a complicated UTI. - Patient will follow up with [facility] as an outpatient to further discuss BPH/BOO, large bladder stone, and timing of right ureteral stent removal  Dispo: Per primary team   LOS: 1 day    I performed a history and physical examination of the patient and discussed the patients management with the resident.  I reviewed the resident's note and agree with the documented findings and plan of care.   Will reschedule him for f/u in our office in 3 weeks for foley catheter exchange and remove his stent at that time.  Given his improvement already, I expect he will continue to get better and be ready for discharge once he is in on culture specific abx.  We will f/u him in hte hospital PRN.  Please contact us w/ any further questions or concerns.

## 2020-12-05 NOTE — Progress Notes (Signed)
PROGRESS NOTE    Corey Cummings  WUJ:[id number] DOB: [birth year] DOA: 12/04/2020 PCP: [name], MD   Chief Complaint  Patient presents with   Dysuria   Brief Narrative: 87 year old with chronic indwelling Foley catheter, DVT/PE on Xarelto, BPH, HTN, presents with flank pain, dysuria, urinary frequency/urgency started 5/27 , Constant flank pain with radiation to the right side abdomen.  In the ED mildly tachycardic hypotensive to 70/116, then febrile 102.3, and became confused labs showed leukocytosis lactic acidosis BUN/creatinine 26/1.5, INR 1.8, UA with pyuria.  CT renal stone showed Foley balloon deployed within the prostate gland, Horseshoe kidney morphology,Severe RIGHT-sided hydronephrosis, not appreciably changed in severity, with associated obstructing 3 cm staghorn calculus again seen at the RIGHT UVJ (also seen on earlier CT of 02/24/2020), new layering hyperdense material within the renal pelvis, new foci of air within the RIGHT renal pelvis and proximal RIGHT ureter, and new perinephric fluid stranding/inflammation indicating superimposed gas-forming infection/pyelonephritis. 4. The LEFT-sided hydronephrosis is increased, possible associated cystitis, RIGHT inguinal hernia which contains loops of small bowel. Patient was admitted for severe sepsis, seen by urology underwent right ureteral stent placement during night   Subjective: Seen and examined this morning.  He feels much better he is alert awake oriented.  Urine is draining with pinkish discoloration some blood.  Assessment & Plan:  Severe sepsis POA due to emphysematous pyelonephritis on the right Right emphysematous pyelonephritis Obstructive 3 cm staghorn calculus on the right UVJ Severe right-sided hydronephrosis w/ large bladder stone and BPH Catheter associated UTI S/p right ureteral stent placement 8/28 night.  Hemodynamically stable, lactic acidosis resolved.  Overnight WBC trended up.  Urology following continue  broad-spectrum antibiotics pending further culture report will need 10 to 14 days course. Recent Labs  Lab 12/04/20 0921 12/04/20 1418 12/04/20 2002 12/05/20 0432  WBC 11.9*  --   --  20.0*  LATICACIDVEN  --  3.5* 1.2  --     Acute metabolic encephalopathy while in ED while he had fever episode.  Continue to supportive care  and treat underlying issues.  Appears to stable.  Chronic indwelling Foley in place-Per urology  Pulmonary embolism/DVT hx: Xarelto held for procedure, follow-up bilateral  LE duplex and "hold if no DVT and resume if okay with urology   Hypertension urgency on admission currently blood pressure stable continue ARBs  CKD stage IIIb Baseline creatinine around 1.5,stable Recent Labs    02/23/20 2232 02/24/20 0414 02/24/20 1223 02/25/20 0400 02/26/20 0055 02/27/20 0411 04/29/20 2256 12/04/20 0921 12/05/20 0432  BUN 32* 28* 26* 27* 24* 23 41* 26* 31*  CREATININE 1.67*  --  1.36* 1.53* 1.42* 1.43* 2.27* 1.55* 1.48*    Reducible right inguinal hernia seen on CT.  Outpatient follow-up  Diet Order             Diet regular Room service appropriate? Yes; Fluid consistency: Thin  Diet effective now                  Patient's There is no height or weight on file to calculate BMI. DVT prophylaxis: SCDs Start: 12/04/20 1655 Code Status:   Code Status: Full Code  Family Communication: plan of care discussed with patient at bedside. Status is: Inpatient  Remains inpatient appropriate because:IV treatments appropriate due to intensity of illness or inability to take PO and Inpatient level of care appropriate due to severity of illness  Dispo: The patient is from: Home  Anticipated d/c is to: Home              Patient currently is not medically stable to d/c.   Difficult to place patient No  Unresulted Labs (From admission, onward)     Start     Ordered   12/04/20 1237  Urine Culture  Once,   STAT       Question:  Indication  Answer:   Dysuria   12/04/20 1236           Medications reviewed:  Scheduled Meds:  acetaminophen  1,000 mg Oral Once   Chlorhexidine Gluconate Cloth  6 each Topical Daily   finasteride  5 mg Oral Daily   irbesartan  150 mg Oral Daily   tamsulosin  0.4 mg Oral Daily   Continuous Infusions:  sodium chloride 75 mL/hr at 12/04/20 1955   ceFEPime (MAXIPIME) IV 2 g (12/05/20 0140)   vancomycin     Consultants:see note  Procedures:see note Antimicrobials: Anti-infectives (From admission, onward)    Start     Dose/Rate Route Frequency Ordered Stop   12/05/20 2200  vancomycin (VANCOREADY) IVPB 750 mg/150 mL        750 mg 150 mL/hr over 60 Minutes Intravenous Every 24 hours 12/04/20 1953     12/05/20 0200  ceFEPIme (MAXIPIME) 2 g in sodium chloride 0.9 % 100 mL IVPB        2 g 200 mL/hr over 30 Minutes Intravenous Every 12 hours 12/04/20 1819     12/04/20 1800  vancomycin (VANCOREADY) IVPB 1500 mg/300 mL        1,500 mg 150 mL/hr over 120 Minutes Intravenous  Once 12/04/20 1759 12/04/20 2156   12/04/20 1614  ceFAZolin (ANCEF) 2-4 GM/100ML-% IVPB       Note to Pharmacy: Myrlene Broker   : cabinet override      12/04/20 1614 12/05/20 0429   12/04/20 1315  ceFEPIme (MAXIPIME) 2 g in sodium chloride 0.9 % 100 mL IVPB        2 g 200 mL/hr over 30 Minutes Intravenous  Once 12/04/20 1314 12/04/20 1545   12/04/20 1245  cephALEXin (KEFLEX) capsule 500 mg        500 mg Oral  Once 12/04/20 1243 12/04/20 1253   12/04/20 0000  cephALEXin (KEFLEX) 500 MG capsule  Status:  Discontinued        500 mg Oral 2 times daily 12/04/20 1242 12/04/20       Culture/Microbiology    Component Value Date/Time   SDES  12/04/2020 1418    BLOOD BLOOD LEFT FOREARM Performed at [facility], [address] [place], [place] [address]    SDES  12/04/2020 1418    BLOOD RIGHT ANTECUBITAL Performed at [facility], [address], [place], [place] [address]    SPECREQUEST   12/04/2020 1418    BOTTLES DRAWN AEROBIC AND ANAEROBIC Blood Culture adequate volume Performed at [facility], [address], [place], [place] [address]    SPECREQUEST  12/04/2020 1418    BOTTLES DRAWN AEROBIC AND ANAEROBIC Blood Culture results may not be optimal due to an excessive volume of blood received in culture bottles Performed at [facility], [address] [place], [place] [address]    CULT  12/04/2020 1418    NO GROWTH < 12 HOURS Performed at [facility], [address], [place], [place] [address]    CULT  12/04/2020 1418  NO GROWTH < 12 HOURS Performed at [facility], [address], [place], [place] [address]    REPTSTATUS PENDING 12/04/2020 1418   REPTSTATUS PENDING 12/04/2020 1418    Other culture-see note  Objective: Vitals: Today's Vitals   12/04/20 2000 12/05/20 0002 12/05/20 0341 12/05/20 0441  BP:  (!) 144/87 (!) 144/83 (!) 145/85  Pulse:  93  92  Resp:  (!) 24 (!) 22 15  Temp:  98.4 F (36.9 C) 98.9 F (37.2 C) 97.6 F (36.4 C)  TempSrc:  Oral Oral Oral  SpO2:  98% 98% 98%  PainSc: 0-No pain       Intake/Output Summary (Last 24 hours) at 12/05/2020 0816 Last data filed at 12/05/2020 0600 Gross per 24 hour  Intake 750 ml  Output 2635 ml  Net -1885 ml   There were no vitals filed for this visit. Weight change:   Intake/Output from previous day: 08/28 0701 - 08/29 0700 In: 750 [I.V.:700; IV Piggyback:50] Out: 2635 [Urine:2625; Blood:10] Intake/Output this shift: No intake/output data recorded. There were no vitals filed for this visit. Examination: General exam: AAO , older than stated age, weak appearing. HEENT:Oral mucosa moist, Ear/Nose WNL grossly,dentition normal. Respiratory system: bilaterally diminished,no use of accessory muscle, non tender. Cardiovascular system: S1 & S2 +,No JVD. Gastrointestinal system: Abdomen soft, NT,ND, BS+. Nervous System:Alert, awake, moving  extremities Extremities: no edema, distal peripheral pulses palpable.  Skin: No rashes,no icterus. MSK: Normal muscle bulk,tone, power Foley+  Data Reviewed: I have personally reviewed following labs and imaging studies CBC: Recent Labs  Lab 12/04/20 0921 12/05/20 0432  WBC 11.9* 20.0*  NEUTROABS  --  18.0*  HGB 10.6* 8.5*  HCT 34.1* 27.0*  MCV 82.6 82.8  PLT 355 278   Basic Metabolic Panel: Recent Labs  Lab 12/04/20 0921 12/05/20 0432  NA 137 139  K 3.9 4.2  CL 103 110  CO2 23 23  GLUCOSE 201* 133*  BUN 26* 31*  CREATININE 1.55* 1.48*  CALCIUM 9.1 8.4*   GFR: CrCl cannot be calculated (Unknown ideal weight.). Liver Function Tests: Recent Labs  Lab 12/04/20 2002  AST 21  ALT 11  ALKPHOS 42  BILITOT 0.6  PROT 5.9*  ALBUMIN 2.2*   No results for input(s): LIPASE, AMYLASE in the last 168 hours. No results for input(s): AMMONIA in the last 168 hours. Coagulation Profile: Recent Labs  Lab 12/04/20 1418 12/05/20 0432  INR 1.8* 1.5*   Cardiac Enzymes: No results for input(s): CKTOTAL, CKMB, CKMBINDEX, TROPONINI in the last 168 hours. BNP (last 3 results) No results for input(s): PROBNP in the last 8760 hours. HbA1C: No results for input(s): HGBA1C in the last 72 hours. CBG: No results for input(s): GLUCAP in the last 168 hours. Lipid Profile: No results for input(s): CHOL, HDL, LDLCALC, TRIG, CHOLHDL, LDLDIRECT in the last 72 hours. Thyroid Function Tests: No results for input(s): TSH, T4TOTAL, FREET4, T3FREE, THYROIDAB in the last 72 hours. Anemia Panel: No results for input(s): VITAMINB12, FOLATE, FERRITIN, TIBC, IRON, RETICCTPCT in the last 72 hours. Sepsis Labs: Recent Labs  Lab 12/04/20 1418 12/04/20 2002  LATICACIDVEN 3.5* 1.2    Recent Results (from the past 240 hour(s))  Blood Culture (routine x 2)     Status: None (Preliminary result)   Collection Time: 12/04/20  2:18 PM   Specimen: BLOOD  Result Value Ref Range Status   Specimen  Description   Final    BLOOD BLOOD LEFT FOREARM Performed at [facility], [address] [place], [place] [address]    Special Requests   Final    BOTTLES DRAWN AEROBIC AND ANAEROBIC Blood Culture adequate volume Performed at [facility], [address] [place], [place] [address]    Culture   Final    NO GROWTH < 12 HOURS Performed at [facility], [address] [place], [place] [address]    Report Status PENDING  Incomplete  Blood Culture (routine x 2)     Status: None (Preliminary result)   Collection Time: 12/04/20  2:18 PM   Specimen: BLOOD  Result Value Ref Range Status   Specimen Description   Final    BLOOD RIGHT ANTECUBITAL Performed at [facility], [address] [place], [place] [address]    Special Requests   Final    BOTTLES DRAWN AEROBIC AND ANAEROBIC Blood Culture results may not be optimal due to an excessive volume of blood received in culture bottles Performed at [facility], [address] [place], [place] [address]    Culture   Final    NO GROWTH < 12 HOURS Performed at [facility], [address] [place], [place] [address]    Report Status PENDING  Incomplete  Resp Panel by RT-PCR (Flu A&B, Covid) Nasopharyngeal Swab     Status: None   Collection Time: 12/04/20  3:12 PM   Specimen: Nasopharyngeal Swab; Nasopharyngeal(NP) swabs in vial transport medium  Result Value Ref Range Status   SARS Coronavirus 2 by RT PCR NEGATIVE NEGATIVE Final    Comment: (NOTE) SARS-CoV-2 target nucleic acids are NOT DETECTED.  The SARS-CoV-2 RNA is generally detectable in upper respiratory specimens during the acute phase of infection. The lowest concentration of SARS-CoV-2 viral copies this assay can detect is 138 copies/mL. A negative result does not preclude SARS-Cov-2 infection and should not be used as the sole basis for treatment or other patient management decisions. A  negative result may occur with  improper specimen collection/handling, submission of specimen other than nasopharyngeal swab, presence of viral mutation(s) within the areas targeted by this assay, and inadequate number of viral copies(<138 copies/mL). A negative result must be combined with clinical observations, patient history, and epidemiological information. The expected result is Negative.  Fact Sheet for Patients:  BloggerCourse.comhttps://www.fda.gov/media/152166/download  Fact Sheet for Healthcare Providers:  SeriousBroker.ithttps://www.fda.gov/media/152162/download  This test is no t yet approved or cleared by the [place] FDA and  has been authorized for detection and/or diagnosis of SARS-CoV-2 by FDA under an Emergency Use Authorization (EUA). This EUA will remain  in effect (meaning this test can be used) for the duration of the COVID-19 declaration under Section 564(b)(1) of the Act, 21 U.S.C.section 360bbb-3(b)(1), unless the authorization is terminated  or revoked sooner.       Influenza A by PCR NEGATIVE NEGATIVE Final   Influenza B by PCR NEGATIVE NEGATIVE Final    Comment: (NOTE) The Xpert Xpress SARS-CoV-2/FLU/RSV plus assay is intended as an aid in the diagnosis of influenza from Nasopharyngeal swab specimens and should not be used as a sole basis for treatment. Nasal washings and aspirates are unacceptable for Xpert Xpress SARS-CoV-2/FLU/RSV testing.  Fact Sheet for Patients: BloggerCourse.comhttps://www.fda.gov/media/152166/download  Fact Sheet for Healthcare Providers: SeriousBroker.ithttps://www.fda.gov/media/152162/download  This test is not yet approved or cleared by the [place] FDA and has been authorized for detection and/or diagnosis of SARS-CoV-2 by FDA under an Emergency Use Authorization (EUA). This EUA will remain in effect (meaning  this test can be used) for the duration of the COVID-19 declaration under Section 564(b)(1) of the Act, 21 U.S.C. section 360bbb-3(b)(1), unless the authorization  is terminated or revoked.  Performed at [facility], [address] [place], [place] [address]      Radiology Studies:  Result Date: 12/04/2020 CLINICAL DATA:  Flank pain. EXAM: CT ABDOMEN AND PELVIS WITHOUT CONTRAST TECHNIQUE: Multidetector CT imaging of the abdomen and pelvis was performed following the standard protocol without IV contrast. COMPARISON:  CT abdomen dated 02/24/2020 FINDINGS: Lower chest: Mild bibasilar atelectasis. Hepatobiliary: No focal liver abnormality is seen. Gallbladder is unremarkable, decompressed. No bile duct dilatation is seen. Pancreas: Unremarkable. No pancreatic ductal dilatation or surrounding inflammatory changes. Spleen: Normal in size without focal abnormality. Adrenals/Urinary Tract: Adrenal glands appear normal. Horseshoe kidney morphology. Severe bilateral hydronephrosis, RIGHT greater than LEFT. Staghorn calculus is present at the RIGHT UVJ, measuring 3.1 cm. This UVJ stone was present on earlier CT of 02/24/2020. The severe RIGHT-sided hydroureter and hydronephrosis is not appreciably changed in extent. However, there is new layering hyperdense material within the renal pelvis, a new small focus of air within the RIGHT renal pelvis, and new perinephric fluid stranding/inflammation suggesting superimposed gas-forming infection/pyelonephritis. The LEFT-sided hydronephrosis is worsened. No layering debris, error or other signs LEFT-sided infection/pyelonephritis. No LEFT-sided ureteral stone. Asymmetric thickening of the RIGHT lateral bladder Harshman, raising the possibility of cystitis. Foley catheter in place, but balloon is deployed at the level of the prostate gland. Stomach/Bowel: No dilated large or small bowel loops. No evidence of bowel Lease inflammation. Diverticulosis throughout the colon, most extensive within the sigmoid and descending colon, but no focal inflammatory change to suggest acute diverticulitis. Stomach is unremarkable. RIGHT  inguinal hernia which contains loops of small bowel, but no evidence of associated bowel obstruction or inflammation at the inguinal hernia site. Appendix is not convincingly seen but there are no inflammatory changes about the cecum to suggest acute appendicitis. Vascular/Lymphatic: Aortic atherosclerosis. Scattered mildly prominent lymph nodes within the abdomen and pelvis, likely reactive in nature. Reproductive: Prostate gland is moderately prominent in size. Other: No layering free fluid within the abdomen or pelvis. No free intraperitoneal air. Musculoskeletal: No acute-appearing osseous abnormality. Old compression fracture deformity of the L1 vertebral body, mild to moderate in degree. Mild degenerative spondylosis of the thoracolumbar spine. IMPRESSION: 1. Horseshoe kidney morphology. 2. Severe RIGHT-sided hydronephrosis, not appreciably changed in severity, with associated obstructing 3 cm staghorn calculus again seen at the RIGHT UVJ (also seen on earlier CT of 02/24/2020). 3. However, there is NEW layering hyperdense material within the renal pelvis, new foci of air within the RIGHT renal pelvis and proximal RIGHT ureter, and new perinephric fluid stranding/inflammation indicating superimposed gas-forming infection/pyelonephritis. 4. The LEFT-sided hydronephrosis is increased. However, there is no obstructing ureteral stone on the LEFT and no secondary signs of infection/pyelonephritis on the LEFT. 5. Asymmetric thickening of the RIGHT lateral bladder Ferencz, raising the possibility of associated cystitis. 6. Please note that the Foley balloon is deployed within the prostate gland. Recommend repositioning. 7. RIGHT inguinal hernia which contains loops of small bowel, but no evidence of associated bowel obstruction or inflammation at the inguinal hernia site. Overall, no bowel obstruction or evidence of bowel [place] inflammation. 8. Colonic diverticulosis without evidence of acute diverticulitis. Please note  that the Foley balloon is deployed within the prostate gland. Recommend repositioning. Aortic Atherosclerosis (ICD10-I70.0). Electronically Signed   By: Bary Richard M.D.   On: 12/04/2020 09:41  LOS: 1 day   Lanae Boast, MD [facility]  12/05/2020, 8:16 AM

## 2020-12-05 NOTE — Progress Notes (Signed)
Bilateral lower extremity venous duplex has been completed. Preliminary results can be found in CV Proc through chart review.   12/05/20 12:11 PM Olen Cordial RVT

## 2020-12-06 LAB — CBC
HCT: 28.4 % — ABNORMAL LOW (ref 39.0–52.0)
Hemoglobin: 8.5 g/dL — ABNORMAL LOW (ref 13.0–17.0)
MCH: 25.6 pg — ABNORMAL LOW (ref 26.0–34.0)
MCHC: 29.9 g/dL — ABNORMAL LOW (ref 30.0–36.0)
MCV: 85.5 fL (ref 80.0–100.0)
Platelets: 242 10*3/uL (ref 150–400)
RBC: 3.32 MIL/uL — ABNORMAL LOW (ref 4.22–5.81)
RDW: 18.6 % — ABNORMAL HIGH (ref 11.5–15.5)
WBC: 12.4 10*3/uL — ABNORMAL HIGH (ref 4.0–10.5)
nRBC: 0 % (ref 0.0–0.2)

## 2020-12-06 LAB — BASIC METABOLIC PANEL
Anion gap: 6 (ref 5–15)
BUN: 33 mg/dL — ABNORMAL HIGH (ref 8–23)
CO2: 21 mmol/L — ABNORMAL LOW (ref 22–32)
Calcium: 8.3 mg/dL — ABNORMAL LOW (ref 8.9–10.3)
Chloride: 114 mmol/L — ABNORMAL HIGH (ref 98–111)
Creatinine, Ser: 1.35 mg/dL — ABNORMAL HIGH (ref 0.61–1.24)
GFR, Estimated: 50 mL/min — ABNORMAL LOW (ref >60)
Glucose, Bld: 88 mg/dL (ref 70–99)
Potassium: 4.1 mmol/L (ref 3.5–5.1)
Sodium: 141 mmol/L (ref 135–145)

## 2020-12-06 LAB — URINE CULTURE: Culture: >=100000 — AB

## 2020-12-06 MED ORDER — RIVAROXABAN 20 MG PO TABS
20.0000 mg | ORAL_TABLET | Freq: Every day | ORAL | Status: DC
  Administered 2020-12-06 – 2020-12-07 (×2): 20 mg via ORAL
  Filled 2020-12-06 (×2): qty 1

## 2020-12-06 MED ORDER — SODIUM CHLORIDE 0.9 % IV SOLN
2.0000 g | Freq: Two times a day (BID) | INTRAVENOUS | Status: DC
  Administered 2020-12-06 – 2020-12-07 (×3): 2 g via INTRAVENOUS
  Filled 2020-12-06 (×4): qty 2

## 2020-12-06 MED ORDER — HYDRALAZINE HCL 25 MG PO TABS: 25.0000 mg | ORAL_TABLET | Freq: Four times a day (QID) | ORAL | Status: DC | PRN

## 2020-12-06 MED ORDER — HYDRALAZINE HCL 25 MG PO TABS
25.0000 mg | ORAL_TABLET | Freq: Four times a day (QID) | ORAL | Status: DC | PRN
  Administered 2020-12-06 – 2020-12-07 (×3): 25 mg via ORAL
  Filled 2020-12-06 (×4): qty 1

## 2020-12-06 MED ORDER — RIVAROXABAN (XARELTO) VTE STARTER PACK (15 & 20 MG): 20.0000 mg | ORAL_TABLET | Freq: Every evening | ORAL | Status: DC

## 2020-12-06 NOTE — Progress Notes (Signed)
PROGRESS NOTE    Corey Cummings  WJX:[id number] DOB: 07/14/1930 DOA: 12/04/2020 PCP: [name], MD   Chief Complaint  Patient presents with   Dysuria   Brief Narrative: 87 year old with chronic indwelling Foley catheter, DVT/PE on Xarelto, BPH, HTN, presents with flank pain, dysuria, urinary frequency/urgency started 5/27 , Constant flank pain with radiation to the right side abdomen.  In the ED mildly tachycardic hypotensive to 70/116, then febrile 102.3, and became confused labs showed leukocytosis lactic acidosis BUN/creatinine 26/1.5, INR 1.8, UA with pyuria.  CT renal stone showed Foley balloon deployed within the prostate gland, Horseshoe kidney morphology,Severe RIGHT-sided hydronephrosis, not appreciably changed in severity, with associated obstructing 3 cm staghorn calculus again seen at the RIGHT UVJ (also seen on earlier CT of 02/24/2020), new layering hyperdense material within the renal pelvis, new foci of air within the RIGHT renal pelvis and proximal RIGHT ureter, and new perinephric fluid stranding/inflammation indicating superimposed gas-forming infection/pyelonephritis. 4. The LEFT-sided hydronephrosis is increased, possible associated cystitis, RIGHT inguinal hernia which contains loops of small bowel. Patient was admitted for severe sepsis, seen by urology underwent right ureteral stent placement during night  Subjective: Seen and examined. Resting comfortably alert awake, Foley with urine draining but appears pinkish with blood  Assessment & Plan:  Severe sepsis POA due to emphysematous pyelonephritis on the right Right emphysematous pyelonephritis Obstructive 3 cm staghorn calculus on the right UVJ Severe right-sided hydronephrosis w/ large bladder stone and BPH Catheter associated UTI 2/2 Citrobacter Ferundii S/p right ureteral stent placement 8/28 night.  Hemodynamically stable, WBC count improving.  Lactic acid is resolved.Urology following-will need 10 to 14 days  course.  Continue current IV antibiotics Recent Labs  Lab 12/04/20 0921 12/04/20 1418 12/04/20 2002 12/05/20 0432 12/06/20 0448  WBC 11.9*  --   --  20.0* 12.4*  LATICACIDVEN  --  3.5* 1.2  --   --      Acute metabolic encephalopathy while in ED while he had fever episode.  Currently resolved.  At baseline.  Continue supportive measures.  Chronic indwelling Foley in place-Per urology  Pulmonary embolism/DVT hx: Xarelto held for procedure, follow-up bilateral  LE duplex shows left popliteal DVT is indeterminate-discussed with Dr. Marlou Porch from urology okay to resume Xarelto, will monitor overnight to make sure he is tolerating before discharge tomorrow morning  Hypertension urgency on admission currently blood pressure stable  on home ARBs  CKD stage IIIb Baseline creatinine around 1.5-stable. Recent Labs    02/23/20 2232 02/24/20 0414 02/24/20 1223 02/25/20 0400 02/26/20 0055 02/27/20 0411 04/29/20 2256 12/04/20 0921 12/05/20 0432 12/06/20 0448  BUN 32* 28* 26* 27* 24* 23 41* 26* 31* 33*  CREATININE 1.67*  --  1.36* 1.53* 1.42* 1.43* 2.27* 1.55* 1.48* 1.35*     Anemia of chronic disease with anemia of acute blood loss with pinkish urine in the Foley.  Hemoglobin down to 8.5 g.  Monitor Recent Labs  Lab 12/04/20 0921 12/05/20 0432 12/06/20 0448  HGB 10.6* 8.5* 8.5*  HCT 34.1* 27.0* 28.4*    Reducible right inguinal hernia seen on CT.  Outpatient follow-up  Diet Order             Diet regular Room service appropriate? Yes; Fluid consistency: Thin  Diet effective now                  Patient's Body mass index is 21.81 kg/m. DVT prophylaxis: SCDs Start: 12/04/20 1655 Code Status:   Code Status: Full Code  Family Communication:  plan of care discussed with patient at bedside. Status is: Inpatient  Remains inpatient appropriate because:IV treatments appropriate due to intensity of illness or inability to take PO and Inpatient level of care appropriate due to  severity of illness  Dispo: The patient is from: Home              Anticipated d/c is to: Home tomorrow  if tolerating xarelto.              Patient currently is not medically stable to d/c.   Difficult to place patient No  Unresulted Labs (From admission, onward)     Start     Ordered   12/06/20 0500  Basic metabolic panel  Daily,   R     Question:  Specimen collection method  Answer:  Lab=Lab collect   12/05/20 1134   12/06/20 0500  CBC  Daily,   R     Question:  Specimen collection method  Answer:  Lab=Lab collect   12/05/20 1134           Medications reviewed:  Scheduled Meds:  acetaminophen  1,000 mg Oral Once   Chlorhexidine Gluconate Cloth  6 each Topical Daily   finasteride  5 mg Oral Daily   irbesartan  150 mg Oral Daily   tamsulosin  0.4 mg Oral Daily   Continuous Infusions:  ceFEPime (MAXIPIME) IV 2 g (12/06/20 0331)   vancomycin 750 mg (12/05/20 2151)   Consultants:see note  Procedures:see note Antimicrobials: Anti-infectives (From admission, onward)    Start     Dose/Rate Route Frequency Ordered Stop   12/06/20 0300  ceFEPIme (MAXIPIME) 2 g in sodium chloride 0.9 % 100 mL IVPB        2 g 200 mL/hr over 30 Minutes Intravenous Every 12 hours 12/06/20 0231     12/05/20 2200  vancomycin (VANCOREADY) IVPB 750 mg/150 mL        750 mg 150 mL/hr over 60 Minutes Intravenous Every 24 hours 12/04/20 1953     12/05/20 0200  ceFEPIme (MAXIPIME) 2 g in sodium chloride 0.9 % 100 mL IVPB  Status:  Discontinued        2 g 200 mL/hr over 30 Minutes Intravenous Every 12 hours 12/04/20 1819 12/06/20 0231   12/04/20 1800  vancomycin (VANCOREADY) IVPB 1500 mg/300 mL        1,500 mg 150 mL/hr over 120 Minutes Intravenous  Once 12/04/20 1759 12/04/20 2156   12/04/20 1614  ceFAZolin (ANCEF) 2-4 GM/100ML-% IVPB       Note to Pharmacy: Myrlene Broker   : cabinet override      12/04/20 1614 12/05/20 0429   12/04/20 1315  ceFEPIme (MAXIPIME) 2 g in sodium chloride 0.9 % 100 mL IVPB         2 g 200 mL/hr over 30 Minutes Intravenous  Once 12/04/20 1314 12/04/20 1545   12/04/20 1245  cephALEXin (KEFLEX) capsule 500 mg        500 mg Oral  Once 12/04/20 1243 12/04/20 1253   12/04/20 0000  cephALEXin (KEFLEX) 500 MG capsule  Status:  Discontinued        500 mg Oral 2 times daily 12/04/20 1242 12/04/20       Culture/Microbiology    Component Value Date/Time   SDES  12/04/2020 1418    BLOOD BLOOD LEFT FOREARM Performed at [facility], [address] [place], [place] [address]    SDES  12/04/2020 [phone]  BLOOD RIGHT ANTECUBITAL Performed at [facility], [address] [place], [place] [address]    SPECREQUEST  12/04/2020 1418    BOTTLES DRAWN AEROBIC AND ANAEROBIC Blood Culture adequate volume Performed at [facility], [address] [place], [place] [address]    SPECREQUEST  12/04/2020 1418    BOTTLES DRAWN AEROBIC AND ANAEROBIC Blood Culture results may not be optimal due to an excessive volume of blood received in culture bottles Performed at [facility], [address], [place], [place] [address]    CULT  12/04/2020 1418    NO GROWTH 2 DAYS Performed at [facility], [address] [place], [place] [address]    CULT  12/04/2020 1418    NO GROWTH 2 DAYS Performed at [facility], [address] [place], [place] [address]    REPTSTATUS PENDING 12/04/2020 1418   REPTSTATUS PENDING 12/04/2020 1418    Other culture-see note  Objective: Vitals: Today's Vitals   12/05/20 2005 12/05/20 2015 12/06/20 0442 12/06/20 0545  BP:  (!) 154/93 (!) 183/90 (!) 170/92  Pulse:  92 93 83  Resp:  20 20   Temp:  97.7 F (36.5 C) 97.9 F (36.6 C)   TempSrc:  Oral Oral   SpO2:  98% 96%   Weight:      Height:      PainSc: 0-No pain  0-No pain     Intake/Output Summary (Last 24 hours) at 12/06/2020 1258 Last data filed at 12/06/2020 0910 Gross per 24 hour  Intake 2285.65  ml  Output 3625 ml  Net -1339.35 ml    Filed Weights   12/05/20 0836  Weight: 67 kg   Weight change:   Intake/Output from previous day: 08/29 0701 - 08/30 0700 In: 2285.7 [P.O.:420; I.V.:1415.8; IV Piggyback:449.9] Out: 2025 [Urine:2025] Intake/Output this shift: Total I/O In: 240 [P.O.:240] Out: 1600 [Urine:1600] Filed Weights   12/05/20 0836  Weight: 67 kg   Examination: General exam: AAOx 3, elderly, pleasant, not in distress.  On room air.  HEENT:Oral mucosa moist, Ear/Nose WNL grossly, dentition normal. Respiratory system: bilaterally clear breath sounds, no use of accessory muscle Cardiovascular system: S1 & S2 +, No JVD,. Gastrointestinal system: Abdomen soft,NT,ND, BS+ Nervous System:Alert, awake, moving extremities and grossly nonfocal Extremities: no edema, distal peripheral pulses palpable.  Skin: No rashes,no icterus. MSK: Normal muscle bulk,tone, power  Foley+ w/ Pinkish urine  Data Reviewed: I have personally reviewed following labs and imaging studies CBC: Recent Labs  Lab 12/04/20 0921 12/05/20 0432 12/06/20 0448  WBC 11.9* 20.0* 12.4*  NEUTROABS  --  18.0*  --   HGB 10.6* 8.5* 8.5*  HCT 34.1* 27.0* 28.4*  MCV 82.6 82.8 85.5  PLT 355 278 242    Basic Metabolic Panel: Recent Labs  Lab 12/04/20 0921 12/05/20 0432 12/06/20 0448  NA 137 139 141  K 3.9 4.2 4.1  CL 103 110 114*  CO2 23 23 21*  GLUCOSE 201* 133* 88  BUN 26* 31* 33*  CREATININE 1.55* 1.48* 1.35*  CALCIUM 9.1 8.4* 8.3*    GFR: Estimated Creatinine Clearance: 35.8 mL/min (A) (by C-G formula based on SCr of 1.35 mg/dL (H)). Liver Function Tests: Recent Labs  Lab 12/04/20 2002  AST 21  ALT 11  ALKPHOS 42  BILITOT 0.6  PROT 5.9*  ALBUMIN 2.2*    No results for input(s): LIPASE, AMYLASE in the last 168 hours. No results for input(s): AMMONIA in the last  168 hours. Coagulation Profile: Recent Labs  Lab 12/04/20 1418 12/05/20 0432  INR 1.8* 1.5*    Cardiac  Enzymes: No results for input(s): CKTOTAL, CKMB, CKMBINDEX, TROPONINI in the last 168 hours. BNP (last 3 results) No results for input(s): PROBNP in the last 8760 hours. HbA1C: No results for input(s): HGBA1C in the last 72 hours. CBG: No results for input(s): GLUCAP in the last 168 hours. Lipid Profile: No results for input(s): CHOL, HDL, LDLCALC, TRIG, CHOLHDL, LDLDIRECT in the last 72 hours. Thyroid Function Tests: No results for input(s): TSH, T4TOTAL, FREET4, T3FREE, THYROIDAB in the last 72 hours. Anemia Panel: No results for input(s): VITAMINB12, FOLATE, FERRITIN, TIBC, IRON, RETICCTPCT in the last 72 hours. Sepsis Labs: Recent Labs  Lab 12/04/20 1418 12/04/20 2002  LATICACIDVEN 3.5* 1.2     Recent Results (from the past 240 hour(s))  Urine Culture     Status: Abnormal   Collection Time: 12/04/20 12:03 PM   Specimen: Urine, Clean Catch  Result Value Ref Range Status   Specimen Description   Final    URINE, CLEAN CATCH Performed at [facility], [address], [place], [place] [address]    Special Requests   Final    NONE Performed at [facility], [address] [place], [place] [address]    Culture >=100,000 COLONIES/mL CITROBACTER FREUNDII (A)  Final   Report Status 12/06/2020 FINAL  Final   Organism ID, Bacteria CITROBACTER FREUNDII (A)  Final      Susceptibility   Citrobacter freundii - MIC*    CEFAZOLIN >=64 RESISTANT Resistant     CEFEPIME <=0.12 SENSITIVE Sensitive     CEFTRIAXONE 16 RESISTANT Resistant     CIPROFLOXACIN <=0.25 SENSITIVE Sensitive     GENTAMICIN <=1 SENSITIVE Sensitive     IMIPENEM 1 SENSITIVE Sensitive     NITROFURANTOIN 256 RESISTANT Resistant     TRIMETH/SULFA <=20 SENSITIVE Sensitive     PIP/TAZO <=4 SENSITIVE Sensitive     * >=100,000 COLONIES/mL CITROBACTER FREUNDII  Blood Culture (routine x 2)     Status: None (Preliminary result)   Collection Time: 12/04/20  2:18 PM   Specimen: BLOOD   Result Value Ref Range Status   Specimen Description   Final    BLOOD BLOOD LEFT FOREARM Performed at [facility], [address], [place], [place] [address]    Special Requests   Final    BOTTLES DRAWN AEROBIC AND ANAEROBIC Blood Culture adequate volume Performed at [facility], [address] [place], [place] [address]    Culture   Final    NO GROWTH 2 DAYS Performed at [facility], [address] [place], [place] [address]    Report Status PENDING  Incomplete  Blood Culture (routine x 2)     Status: None (Preliminary result)   Collection Time: 12/04/20  2:18 PM   Specimen: BLOOD  Result Value Ref Range Status   Specimen Description   Final    BLOOD RIGHT ANTECUBITAL Performed at [facility], [address], [place], [place] [address]    Special Requests   Final    BOTTLES DRAWN AEROBIC AND ANAEROBIC Blood Culture results may not be optimal due to an excessive volume of blood received in culture bottles Performed at [facility], [address], [place], [place] [address]    Culture   Final    NO GROWTH 2 DAYS Performed at [facility],  [address], [place], [place] [address]    Report Status PENDING  Incomplete  Resp Panel by RT-PCR (Flu A&B, Covid) Nasopharyngeal Swab     Status: None   Collection Time: 12/04/20  3:12 PM   Specimen: Nasopharyngeal Swab; Nasopharyngeal(NP) swabs in vial transport medium  Result Value Ref Range Status   SARS Coronavirus 2 by RT PCR NEGATIVE NEGATIVE Final    Comment: (NOTE) SARS-CoV-2 target nucleic acids are NOT DETECTED.  The SARS-CoV-2 RNA is generally detectable in upper respiratory specimens during the acute phase of infection. The lowest concentration of SARS-CoV-2 viral copies this assay can detect is 138 copies/mL. A negative result does not preclude SARS-Cov-2 infection and should not be used as the sole basis for treatment  or other patient management decisions. A negative result may occur with  improper specimen collection/handling, submission of specimen other than nasopharyngeal swab, presence of viral mutation(s) within the areas targeted by this assay, and inadequate number of viral copies(<138 copies/mL). A negative result must be combined with clinical observations, patient history, and epidemiological information. The expected result is Negative.  Fact Sheet for Patients:  BloggerCourse.com  Fact Sheet for Healthcare Providers:  SeriousBroker.it  This test is no t yet approved or cleared by the [place] FDA and  has been authorized for detection and/or diagnosis of SARS-CoV-2 by FDA under an Emergency Use Authorization (EUA). This EUA will remain  in effect (meaning this test can be used) for the duration of the COVID-19 declaration under Section 564(b)(1) of the Act, 21 U.S.C.section 360bbb-3(b)(1), unless the authorization is terminated  or revoked sooner.       Influenza A by PCR NEGATIVE NEGATIVE Final   Influenza B by PCR NEGATIVE NEGATIVE Final    Comment: (NOTE) The Xpert Xpress SARS-CoV-2/FLU/RSV plus assay is intended as an aid in the diagnosis of influenza from Nasopharyngeal swab specimens and should not be used as a sole basis for treatment. Nasal washings and aspirates are unacceptable for Xpert Xpress SARS-CoV-2/FLU/RSV testing.  Fact Sheet for Patients: BloggerCourse.com  Fact Sheet for Healthcare Providers: SeriousBroker.it  This test is not yet approved or cleared by the [place] FDA and has been authorized for detection and/or diagnosis of SARS-CoV-2 by FDA under an Emergency Use Authorization (EUA). This EUA will remain in effect (meaning this test can be used) for the duration of the COVID-19 declaration under Section 564(b)(1) of the Act, 21 U.S.C. section  360bbb-3(b)(1), unless the authorization is terminated or revoked.  Performed at [facility], [address], [place], [place] [address]       Radiology Studies:  Result Date: 12/04/2020 CLINICAL DATA:  Flank pain. EXAM: CT ABDOMEN AND PELVIS WITHOUT CONTRAST TECHNIQUE: Multidetector CT imaging of the abdomen and pelvis was performed following the standard protocol without IV contrast. COMPARISON:  CT abdomen dated 02/24/2020 FINDINGS: Lower chest: Mild bibasilar atelectasis. Hepatobiliary: No focal liver abnormality is seen. Gallbladder is unremarkable, decompressed. No bile duct dilatation is seen. Pancreas: Unremarkable. No pancreatic ductal dilatation or surrounding inflammatory changes. Spleen: Normal in size without focal abnormality. Adrenals/Urinary Tract: Adrenal glands appear normal. Horseshoe kidney morphology. Severe bilateral hydronephrosis, RIGHT greater than LEFT. Staghorn calculus is present at the RIGHT UVJ, measuring 3.1 cm. This UVJ stone was present on earlier CT of 02/24/2020. The severe RIGHT-sided hydroureter and hydronephrosis is not appreciably changed in extent. However, there is new layering hyperdense material within the renal pelvis, a new small focus of air within the RIGHT renal pelvis,  and new perinephric fluid stranding/inflammation suggesting superimposed gas-forming infection/pyelonephritis. The LEFT-sided hydronephrosis is worsened. No layering debris, error or other signs LEFT-sided infection/pyelonephritis. No LEFT-sided ureteral stone. Asymmetric thickening of the RIGHT lateral bladder [place], raising the possibility of cystitis. Foley catheter in place, but balloon is deployed at the level of the prostate gland. Stomach/Bowel: No dilated large or small bowel loops. No evidence of bowel Millay inflammation. Diverticulosis throughout the colon, most extensive within the sigmoid and descending colon, but no focal inflammatory change to suggest acute  diverticulitis. Stomach is unremarkable. RIGHT inguinal hernia which contains loops of small bowel, but no evidence of associated bowel obstruction or inflammation at the inguinal hernia site. Appendix is not convincingly seen but there are no inflammatory changes about the cecum to suggest acute appendicitis. Vascular/Lymphatic: Aortic atherosclerosis. Scattered mildly prominent lymph nodes within the abdomen and pelvis, likely reactive in nature. Reproductive: Prostate gland is moderately prominent in size. Other: No layering free fluid within the abdomen or pelvis. No free intraperitoneal air. Musculoskeletal: No acute-appearing osseous abnormality. Old compression fracture deformity of the L1 vertebral body, mild to moderate in degree. Mild degenerative spondylosis of the thoracolumbar spine. IMPRESSION: 1. Horseshoe kidney morphology. 2. Severe RIGHT-sided hydronephrosis, not appreciably changed in severity, with associated obstructing 3 cm staghorn calculus again seen at the RIGHT UVJ (also seen on earlier CT of 02/24/2020). 3. However, there is NEW layering hyperdense material within the renal pelvis, new foci of air within the RIGHT renal pelvis and proximal RIGHT ureter, and new perinephric fluid stranding/inflammation indicating superimposed gas-forming infection/pyelonephritis. 4. The LEFT-sided hydronephrosis is increased. However, there is no obstructing ureteral stone on the LEFT and no secondary signs of infection/pyelonephritis on the LEFT. 5. Asymmetric thickening of the RIGHT lateral bladder Lo[place], raising the possibility of associated cystitis. 6. Please note that the Foley balloon is deployed within the prostate gland. Recommend repositioning. 7. RIGHT inguinal hernia which contains loops of small bowel, but no evidence of associated bowel obstruction or inflammation at the inguinal hernia site. Overall, no bowel obstruction or evidence of bowel Helgeson inflammation. 8. Colonic diverticulosis without  evidence of acute diverticulitis. Please note that the Foley balloon is deployed within the prostate gland. Recommend repositioning. Aortic Atherosclerosis (ICD10-I70.0). Electronically Signed   By: Bary RichardStan  Maynard M.D.   On: 12/04/2020 09:41     LOS: 2 days   Lanae Boastamesh Timea Breed, MD [facility]  12/06/2020, 12:58 PM

## 2020-12-07 LAB — CBC
HCT: 29.3 % — ABNORMAL LOW (ref 39.0–52.0)
Hemoglobin: 9.2 g/dL — ABNORMAL LOW (ref 13.0–17.0)
MCH: 25.6 pg — ABNORMAL LOW (ref 26.0–34.0)
MCHC: 31.4 g/dL (ref 30.0–36.0)
MCV: 81.6 fL (ref 80.0–100.0)
Platelets: 265 10*3/uL (ref 150–400)
RBC: 3.59 MIL/uL — ABNORMAL LOW (ref 4.22–5.81)
RDW: 18.1 % — ABNORMAL HIGH (ref 11.5–15.5)
WBC: 7.8 10*3/uL (ref 4.0–10.5)
nRBC: 0 % (ref 0.0–0.2)

## 2020-12-07 LAB — BASIC METABOLIC PANEL
Anion gap: 7 (ref 5–15)
BUN: 28 mg/dL — ABNORMAL HIGH (ref 8–23)
CO2: 26 mmol/L (ref 22–32)
Calcium: 8.4 mg/dL — ABNORMAL LOW (ref 8.9–10.3)
Chloride: 109 mmol/L (ref 98–111)
Creatinine, Ser: 1.36 mg/dL — ABNORMAL HIGH (ref 0.61–1.24)
GFR, Estimated: 50 mL/min — ABNORMAL LOW (ref >60)
Glucose, Bld: 96 mg/dL (ref 70–99)
Potassium: 3.7 mmol/L (ref 3.5–5.1)
Sodium: 142 mmol/L (ref 135–145)

## 2020-12-07 MED ORDER — RIVAROXABAN (XARELTO) VTE STARTER PACK (15 & 20 MG): 20.0000 mg | ORAL_TABLET | Freq: Every day | ORAL | Status: DC

## 2020-12-07 MED ORDER — CIPROFLOXACIN HCL 500 MG PO TABS
500.0000 mg | ORAL_TABLET | Freq: Two times a day (BID) | ORAL | Status: DC
  Administered 2020-12-07: 500 mg via ORAL
  Filled 2020-12-07: qty 1

## 2020-12-07 MED ORDER — AMLODIPINE BESYLATE 5 MG PO TABS: 5.0000 mg | ORAL_TABLET | Freq: Every day | ORAL | 1 refills | Status: DC

## 2020-12-07 MED ORDER — CIPROFLOXACIN HCL 500 MG PO TABS: 500.0000 mg | ORAL_TABLET | Freq: Two times a day (BID) | ORAL | 0 refills | Status: DC

## 2020-12-07 MED ORDER — AMLODIPINE BESYLATE 5 MG PO TABS
5.0000 mg | ORAL_TABLET | Freq: Every day | ORAL | Status: DC
  Administered 2020-12-07: 5 mg via ORAL
  Filled 2020-12-07: qty 1

## 2020-12-07 NOTE — Discharge Summary (Signed)
Physician Discharge Summary  Corey Cummings WUJ:[id number] DOB: 1930/10/17 DOA: 12/04/2020  PCP: [name], MD  Admit date: 12/04/2020 Discharge date: 12/07/2020  Admitted From: Home Disposition: Home  Recommendations for Outpatient Follow-up:  Follow up with PCP in 1-2 weeks Please obtain BMP/CBC in one week Recently followed up by urology in the next 3 weeks  Discharge Condition: Stable CODE STATUS: Full code Diet recommendation: Heart healthy  Brief/Interim Summary: 87 year old with chronic indwelling Foley catheter, DVT/PE on Xarelto, BPH, HTN, presents with flank pain, dysuria, urinary frequency/urgency, Constant flank pain with radiation to the right side abdomen.  In the ED mildly tachycardic, hypotensive, then febrile 102.3, and became confused labs showed leukocytosis, lactic acidosis BUN/creatinine 26/1.5, INR 1.8, UA with pyuria.  CT renal stone showed Foley balloon deployed within the prostate gland, Horseshoe kidney morphology,Severe RIGHT-sided hydronephrosis, not appreciably changed in severity, with associated obstructing 3 cm staghorn calculus again seen at the RIGHT UVJ (also seen on earlier CT of 02/24/2020), new layering hyperdense material within the renal pelvis, new foci of air within the RIGHT renal pelvis and proximal RIGHT ureter, and new perinephric fluid stranding/inflammation indicating superimposed gas-forming infection/pyelonephritis. 4. The LEFT-sided hydronephrosis is increased, possible associated cystitis, RIGHT inguinal hernia which contains loops of small bowel. Patient was admitted for severe sepsis, seen by urology underwent right ureteral stent placement   Discharge Diagnoses:  Principal Problem:   Severe sepsis (HCC) Active Problems:   Ureteral obstruction, right   Hydronephrosis with urinary obstruction due to ureteral calculus   Pulmonary embolism (HCC)   Lactic acidosis   Acute cystitis without hematuria   Foley catheter problem (HCC)    Unilateral recurrent inguinal hernia without obstruction or gangrene   Acute pyelonephritis   BPH (benign prostatic hyperplasia)   Chronic indwelling Foley catheter   Hypertensive urgency   CKD (chronic kidney disease), stage III (HCC)  Severe sepsis POA due to emphysematous pyelonephritis on the right Right emphysematous pyelonephritis Obstructive 3 cm staghorn calculus on the right UVJ Severe right-sided hydronephrosis w/ large bladder stone and BPH Catheter associated UTI 2/2 Citrobacter Ferundii S/p right ureteral stent placement 8/28.  Hemodynamically stable, WBC count improving.  Lactic acid is resolved.  Seen by urology with recommendations for total of 10 to 14 days of antibiotics.  He was initially treated with IV antibiotics and then based on urine cultures was transitioned to ciprofloxacin.  He will follow-up with urology as an outpatient in the next few weeks and will also have catheter exchange at that point. Last Labs          Recent Labs  Lab 12/04/20 0921 12/04/20 1418 12/04/20 2002 12/05/20 0432 12/06/20 0448  WBC 11.9*  --   --  20.0* 12.4*  LATICACIDVEN  --  3.5* 1.2  --   --        Acute metabolic encephalopathy while in ED while he had fever episode.  Currently resolved.  At baseline.  Continue supportive measures.   Chronic indwelling Foley in place-Per urology   Pulmonary embolism/DVT hx: Xarelto held for procedure, follow-up bilateral  LE duplex shows left popliteal DVT is indeterminate-discussed with Dr. Marlou Porch from urology okay to resume Xarelto.  Patient will be discharged on anticoagulation.   Hypertension urgency on admission currently blood pressure stable  on home ARBs.  Also added Norvasc to antihypertensive regimen.   CKD stage IIIb Baseline creatinine around 1.5-stable. Recent Labs (within last 365 days)  Recent Labs    02/23/20 2232 02/24/20 0414 02/24/20 1223 02/25/20 0400 02/26/20 0055 02/27/20 0411 04/29/20 2256  12/04/20 0921 12/05/20 0432 12/06/20 0448  BUN 32* 28* 26* 27* 24* 23 41* 26* 31* 33*  CREATININE 1.67*  --  1.36* 1.53* 1.42* 1.43* 2.27* 1.55* 1.48* 1.35*       Anemia of chronic disease with anemia of acute blood loss with pinkish urine in the Foley.  Hemoglobin currently stable at 9.2 Last Labs        Recent Labs  Lab 12/04/20 0921 12/05/20 0432 12/06/20 0448  HGB 10.6* 8.5* 8.5*  HCT 34.1* 27.0* 28.4*      Reducible right inguinal hernia seen on CT.  Outpatient follow-up  Discharge Instructions  Discharge Instructions     Diet - low sodium heart healthy   Complete by: As directed    Increase activity slowly   Complete by: As directed       Allergies as of 12/07/2020   No Known Allergies      Medication List     TAKE these medications    amLODipine 5 MG tablet Commonly known as: NORVASC Take 1 tablet (5 mg total) by mouth daily.   ciprofloxacin 500 MG tablet Commonly known as: CIPRO Take 1 tablet (500 mg total) by mouth 2 (two) times daily.   finasteride 5 MG tablet Commonly known as: PROSCAR Take 5 mg by mouth daily.   Fusion Plus Caps Take 1 capsule by mouth daily.   olmesartan 20 MG tablet Commonly known as: BENICAR Take 20 mg by mouth daily.   Rivaroxaban Stater Pack (15 mg and 20 mg) Commonly known as: XARELTO STARTER PACK Take 20 mg by mouth daily.   tamsulosin 0.4 MG Caps capsule Commonly known as: FLOMAX Take 0.4 mg by mouth daily.        Follow-up Information     Stechschulte, Hyman Hopes, MD .   Specialty: Surgery Why: To discuss hernia and get more education about it. Contact information: [address] [place] [address] [phone]         [facility]. Schedule an appointment as soon as possible for a visit.   Contact information: [address] [place] [address] [phone]               No Known  Allergies  Consultations: Urology   Procedures/Studies:    Subjective: Denies any complaints, ready to discharge home.  Discharge Exam: Vitals:   12/06/20 1953 12/07/20 0453 12/07/20 0706 12/07/20 1327  BP: (!) 169/98 (!) 172/97 (!) 187/97 (!) 177/104  Pulse: 87 75 84 91  Resp: 18 16  14   Temp: 97.9 F (36.6 C) 98.2 F (36.8 C)  98.2 F (36.8 C)  TempSrc: Oral Oral  Oral  SpO2: 98% 100% 98% 99%  Weight:      Height:        General: Pt is alert, awake, not in acute distress Cardiovascular: RRR, S1/S2 +, no rubs, no gallops Respiratory: CTA bilaterally, no wheezing, no rhonchi Abdominal: Soft, NT, ND, bowel sounds + Extremities: no edema, no cyanosis    The results of significant diagnostics from this hospitalization (including imaging, microbiology, ancillary and laboratory) are listed below for reference.     Microbiology: Recent Results (from the past 240 hour(s))  Urine Culture     Status: Abnormal   Collection Time: 12/04/20 12:03 PM   Specimen: Urine, Clean Catch  Result Value Ref  Range Status   Specimen Description   Final    URINE, CLEAN CATCH Performed at [facility], [address] [place], [place] [address]    Special Requests   Final    NONE Performed at [facility], [address], [place], [place] [address]    Culture >=100,000 COLONIES/mL CITROBACTER FREUNDII (A)  Final   Report Status 12/06/2020 FINAL  Final   Organism ID, Bacteria CITROBACTER FREUNDII (A)  Final      Susceptibility   Citrobacter freundii - MIC*    CEFAZOLIN >=64 RESISTANT Resistant     CEFEPIME <=0.12 SENSITIVE Sensitive     CEFTRIAXONE 16 RESISTANT Resistant     CIPROFLOXACIN <=0.25 SENSITIVE Sensitive     GENTAMICIN <=1 SENSITIVE Sensitive     IMIPENEM 1 SENSITIVE Sensitive     NITROFURANTOIN 256 RESISTANT Resistant     TRIMETH/SULFA <=20 SENSITIVE Sensitive     PIP/TAZO <=4 SENSITIVE Sensitive     * >=100,000 COLONIES/mL  CITROBACTER FREUNDII  Blood Culture (routine x 2)     Status: None (Preliminary result)   Collection Time: 12/04/20  2:18 PM   Specimen: BLOOD  Result Value Ref Range Status   Specimen Description   Final    BLOOD BLOOD LEFT FOREARM Performed at [facility], [address] [place], [place] [address]    Special Requests   Final    BOTTLES DRAWN AEROBIC AND ANAEROBIC Blood Culture adequate volume Performed at [facility], [address], [place], [place] [address]    Culture   Final    NO GROWTH 3 DAYS Performed at [facility], [address], [place], [place] [address]    Report Status PENDING  Incomplete  Blood Culture (routine x 2)     Status: None (Preliminary result)   Collection Time: 12/04/20  2:18 PM   Specimen: BLOOD  Result Value Ref Range Status   Specimen Description   Final    BLOOD RIGHT ANTECUBITAL Performed at [facility], [address], [place], [place] [address]    Special Requests   Final    BOTTLES DRAWN AEROBIC AND ANAEROBIC Blood Culture results may not be optimal due to an excessive volume of blood received in culture bottles Performed at [facility], [address], [place], [place] [address]    Culture   Final    NO GROWTH 3 DAYS Performed at [facility], [address], [place], [place] [address]    Report Status PENDING  Incomplete  Resp Panel by RT-PCR (Flu A&B, Covid) Nasopharyngeal Swab     Status: None   Collection Time: 12/04/20  3:12 PM   Specimen: Nasopharyngeal Swab; Nasopharyngeal(NP) swabs in vial transport medium  Result Value Ref Range Status   SARS Coronavirus 2 by RT PCR NEGATIVE NEGATIVE Final    Comment: (NOTE) SARS-CoV-2 target nucleic acids are NOT DETECTED.  The SARS-CoV-2 RNA is generally detectable in upper respiratory specimens during the acute phase of infection. The lowest concentration of SARS-CoV-2 viral copies this assay can  detect is 138 copies/mL. A negative result does not preclude SARS-Cov-2 infection and should not be used as the sole basis for treatment or other patient management decisions. A negative result may occur with  improper specimen collection/handling, submission of specimen other than nasopharyngeal swab, presence of viral mutation(s) within the areas targeted by this assay, and inadequate number of viral copies(<138 copies/mL). A negative result  must be combined with clinical observations, patient history, and epidemiological information. The expected result is Negative.  Fact Sheet for Patients:  BloggerCourse.comhttps://www.fda.gov/media/152166/download  Fact Sheet for Healthcare Providers:  SeriousBroker.ithttps://www.fda.gov/media/152162/download  This test is no t yet approved or cleared by the [place] FDA and  has been authorized for detection and/or diagnosis of SARS-CoV-2 by FDA under an Emergency Use Authorization (EUA). This EUA will remain  in effect (meaning this test can be used) for the duration of the COVID-19 declaration under Section 564(b)(1) of the Act, 21 U.S.C.section 360bbb-3(b)(1), unless the authorization is terminated  or revoked sooner.       Influenza A by PCR NEGATIVE NEGATIVE Final   Influenza B by PCR NEGATIVE NEGATIVE Final    Comment: (NOTE) The Xpert Xpress SARS-CoV-2/FLU/RSV plus assay is intended as an aid in the diagnosis of influenza from Nasopharyngeal swab specimens and should not be used as a sole basis for treatment. Nasal washings and aspirates are unacceptable for Xpert Xpress SARS-CoV-2/FLU/RSV testing.  Fact Sheet for Patients: BloggerCourse.comhttps://www.fda.gov/media/152166/download  Fact Sheet for Healthcare Providers: SeriousBroker.ithttps://www.fda.gov/media/152162/download  This test is not yet approved or cleared by the [place] FDA and has been authorized for detection and/or diagnosis of SARS-CoV-2 by FDA under an Emergency Use Authorization (EUA). This EUA will remain in  effect (meaning this test can be used) for the duration of the COVID-19 declaration under Section 564(b)(1) of the Act, 21 U.S.C. section 360bbb-3(b)(1), unless the authorization is terminated or revoked.  Performed at [facility], [address] [place], [place] [id number]      Labs: BNP (last 3 results) Recent Labs    02/24/20 1223  BNP 285.5*   Basic Metabolic Panel: Recent Labs  Lab 12/04/20 0921 12/05/20 0432 12/06/20 0448 12/07/20 0511  NA 137 139 141 142  K 3.9 4.2 4.1 3.7  CL 103 110 114* 109  CO2 23 23 21* 26  GLUCOSE 201* 133* 88 96  BUN 26* 31* 33* 28*  CREATININE 1.55* 1.48* 1.35* 1.36*  CALCIUM 9.1 8.4* 8.3* 8.4*   Liver Function Tests: Recent Labs  Lab 12/04/20 2002  AST 21  ALT 11  ALKPHOS 42  BILITOT 0.6  PROT 5.9*  ALBUMIN 2.2*   No results for input(s): LIPASE, AMYLASE in the last 168 hours. No results for input(s): AMMONIA in the last 168 hours. CBC: Recent Labs  Lab 12/04/20 0921 12/05/20 0432 12/06/20 0448 12/07/20 0511  WBC 11.9* 20.0* 12.4* 7.8  NEUTROABS  --  18.0*  --   --   HGB 10.6* 8.5* 8.5* 9.2*  HCT 34.1* 27.0* 28.4* 29.3*  MCV 82.6 82.8 85.5 81.6  PLT 355 278 242 265   Cardiac Enzymes: No results for input(s): CKTOTAL, CKMB, CKMBINDEX, TROPONINI in the last 168 hours. BNP: Invalid input(s): POCBNP CBG: No results for input(s): GLUCAP in the last 168 hours. D-Dimer No results for input(s): DDIMER in the last 72 hours. Hgb A1c No results for input(s): HGBA1C in the last 72 hours. Lipid Profile No results for input(s): CHOL, HDL, LDLCALC, TRIG, CHOLHDL, LDLDIRECT in the last 72 hours. Thyroid function studies No results for input(s): TSH, T4TOTAL, T3FREE, THYROIDAB in the last 72 hours.  Invalid input(s): FREET3 Anemia work up No results for input(s): VITAMINB12, FOLATE, FERRITIN, TIBC, IRON, RETICCTPCT in the last 72 hours. Urinalysis    Component Value Date/Time   COLORURINE YELLOW  12/04/2020 1203   APPEARANCEUR CLOUDY (A) 12/04/2020 1203   LABSPEC 1.009 12/04/2020 1203   PHURINE  6.0 12/04/2020 1203   GLUCOSEU NEGATIVE 12/04/2020 1203   HGBUR LARGE (A) 12/04/2020 1203   BILIRUBINUR NEGATIVE 12/04/2020 1203   KETONESUR NEGATIVE 12/04/2020 1203   PROTEINUR 100 (A) 12/04/2020 1203   NITRITE POSITIVE (A) 12/04/2020 1203   LEUKOCYTESUR LARGE (A) 12/04/2020 1203   Sepsis Labs Invalid input(s): PROCALCITONIN,  WBC,  LACTICIDVEN Microbiology Recent Results (from the past 240 hour(s))  Urine Culture     Status: Abnormal   Collection Time: 12/04/20 12:03 PM   Specimen: Urine, Clean Catch  Result Value Ref Range Status   Specimen Description   Final    URINE, CLEAN CATCH Performed at [facility], [address], [place], [place] [address]    Special Requests   Final    NONE Performed at [facility], [address] [place], [place] [address]    Culture >=100,000 COLONIES/mL CITROBACTER FREUNDII (A)  Final   Report Status 12/06/2020 FINAL  Final   Organism ID, Bacteria CITROBACTER FREUNDII (A)  Final      Susceptibility   Citrobacter freundii - MIC*    CEFAZOLIN >=64 RESISTANT Resistant     CEFEPIME <=0.12 SENSITIVE Sensitive     CEFTRIAXONE 16 RESISTANT Resistant     CIPROFLOXACIN <=0.25 SENSITIVE Sensitive     GENTAMICIN <=1 SENSITIVE Sensitive     IMIPENEM 1 SENSITIVE Sensitive     NITROFURANTOIN 256 RESISTANT Resistant     TRIMETH/SULFA <=20 SENSITIVE Sensitive     PIP/TAZO <=4 SENSITIVE Sensitive     * >=100,000 COLONIES/mL CITROBACTER FREUNDII  Blood Culture (routine x 2)     Status: None (Preliminary result)   Collection Time: 12/04/20  2:18 PM   Specimen: BLOOD  Result Value Ref Range Status   Specimen Description   Final    BLOOD BLOOD LEFT FOREARM Performed at [facility], [address] [place], [place] [address]    Special Requests   Final    BOTTLES DRAWN AEROBIC AND ANAEROBIC  Blood Culture adequate volume Performed at [facility], [address], [place], [place] [address]    Culture   Final    NO GROWTH 3 DAYS Performed at [facility], [address] [place], [place] [address]    Report Status PENDING  Incomplete  Blood Culture (routine x 2)     Status: None (Preliminary result)   Collection Time: 12/04/20  2:18 PM   Specimen: BLOOD  Result Value Ref Range Status   Specimen Description   Final    BLOOD RIGHT ANTECUBITAL Performed at [facility], [address], [place], [place] [address]    Special Requests   Final    BOTTLES DRAWN AEROBIC AND ANAEROBIC Blood Culture results may not be optimal due to an excessive volume of blood received in culture bottles Performed at [facility], [address] [place], [place] [address]    Culture   Final    NO GROWTH 3 DAYS Performed at [facility], [address] [place], [place] [address]    Report Status PENDING  Incomplete  Resp Panel by RT-PCR (Flu A&B, Covid) Nasopharyngeal Swab     Status: None   Collection Time: 12/04/20  3:12 PM   Specimen: Nasopharyngeal Swab; Nasopharyngeal(NP) swabs in vial transport medium  Result Value Ref Range Status   SARS Coronavirus 2 by RT PCR NEGATIVE NEGATIVE Final    Comment: (NOTE) SARS-CoV-2 target nucleic acids are NOT DETECTED.  The SARS-CoV-2 RNA is generally detectable in upper respiratory specimens during the acute phase of infection. The lowest concentration of SARS-CoV-2 viral copies this assay can detect is 138 copies/mL. A negative result does not preclude SARS-Cov-2 infection and should not be used as the sole basis for treatment or other patient management decisions. A negative result may occur with  improper specimen collection/handling, submission of specimen other than nasopharyngeal swab, presence of viral mutation(s) within the areas targeted by this assay, and inadequate  number of viral copies(<138 copies/mL). A negative result must be combined with clinical observations, patient history, and epidemiological information. The expected result is Negative.  Fact Sheet for Patients:  BloggerCourse.com  Fact Sheet for Healthcare Providers:  SeriousBroker.it  This test is no t yet approved or cleared by the [place] FDA and  has been authorized for detection and/or diagnosis of SARS-CoV-2 by FDA under an Emergency Use Authorization (EUA). This EUA will remain  in effect (meaning this test can be used) for the duration of the COVID-19 declaration under Section 564(b)(1) of the Act, 21 U.S.C.section 360bbb-3(b)(1), unless the authorization is terminated  or revoked sooner.       Influenza A by PCR NEGATIVE NEGATIVE Final   Influenza B by PCR NEGATIVE NEGATIVE Final    Comment: (NOTE) The Xpert Xpress SARS-CoV-2/FLU/RSV plus assay is intended as an aid in the diagnosis of influenza from Nasopharyngeal swab specimens and should not be used as a sole basis for treatment. Nasal washings and aspirates are unacceptable for Xpert Xpress SARS-CoV-2/FLU/RSV testing.  Fact Sheet for Patients: BloggerCourse.com  Fact Sheet for Healthcare Providers: SeriousBroker.it  This test is not yet approved or cleared by the [place] FDA and has been authorized for detection and/or diagnosis of SARS-CoV-2 by FDA under an Emergency Use Authorization (EUA). This EUA will remain in effect (meaning this test can be used) for the duration of the COVID-19 declaration under Section 564(b)(1) of the Act, 21 U.S.C. section 360bbb-3(b)(1), unless the authorization is terminated or revoked.  Performed at [facility], [address], [place], [place] [address]      Time coordinating discharge:  SIGNED:   Erick Blinks, MD  [facility] 12/07/2020, 8:54 PM   If 7PM-7AM, please contact night-coverage [email or web address]

## 2020-12-07 NOTE — Plan of Care (Signed)
  Problem: Health Behavior/Discharge Planning: Goal: Ability to manage health-related needs will improve Outcome: Progressing   Problem: Clinical Measurements: Goal: Will remain free from infection Outcome: Progressing   Problem: Elimination: Goal: Will not experience complications related to bowel motility Outcome: Progressing Goal: Will not experience complications related to urinary retention Outcome: Progressing

## 2020-12-09 LAB — CULTURE, BLOOD (ROUTINE X 2)
Culture: NO GROWTH
Culture: NO GROWTH
Special Requests: ADEQUATE

## 2020-12-10 ENCOUNTER — Other Ambulatory Visit: Payer: Self-pay

## 2020-12-10 ENCOUNTER — Encounter (HOSPITAL_COMMUNITY): Payer: Self-pay

## 2020-12-10 ENCOUNTER — Emergency Department (HOSPITAL_COMMUNITY)
Admission: EM | Admit: 2020-12-10 | Discharge: 2020-12-10 | Disposition: A | Discharge status: Home / Self Care | Payer: Medicare HMO | Attending: Emergency Medicine | Admitting: Emergency Medicine

## 2020-12-10 DIAGNOSIS — T83098A Other mechanical complication of other indwelling urethral catheter, initial encounter: Secondary | ICD-10-CM | POA: Diagnosis present

## 2020-12-10 DIAGNOSIS — N179 Acute kidney failure, unspecified: Secondary | ICD-10-CM

## 2020-12-10 DIAGNOSIS — Z87891 Personal history of nicotine dependence: Secondary | ICD-10-CM | POA: Insufficient documentation

## 2020-12-10 DIAGNOSIS — I129 Hypertensive chronic kidney disease with stage 1 through stage 4 chronic kidney disease, or unspecified chronic kidney disease: Secondary | ICD-10-CM | POA: Insufficient documentation

## 2020-12-10 DIAGNOSIS — Y846 Urinary catheterization as the cause of abnormal reaction of the patient, or of later complication, without mention of misadventure at the time of the procedure: Secondary | ICD-10-CM | POA: Diagnosis not present

## 2020-12-10 DIAGNOSIS — N183 Chronic kidney disease, stage 3 unspecified: Secondary | ICD-10-CM | POA: Diagnosis not present

## 2020-12-10 DIAGNOSIS — Z7901 Long term (current) use of anticoagulants: Secondary | ICD-10-CM | POA: Insufficient documentation

## 2020-12-10 DIAGNOSIS — T839XXA Unspecified complication of genitourinary prosthetic device, implant and graft, initial encounter: Secondary | ICD-10-CM

## 2020-12-10 DIAGNOSIS — D72829 Elevated white blood cell count, unspecified: Secondary | ICD-10-CM | POA: Diagnosis not present

## 2020-12-10 DIAGNOSIS — R339 Retention of urine, unspecified: Secondary | ICD-10-CM

## 2020-12-10 DIAGNOSIS — Z79899 Other long term (current) drug therapy: Secondary | ICD-10-CM | POA: Diagnosis not present

## 2020-12-10 LAB — CBC WITH DIFFERENTIAL/PLATELET
Abs Immature Granulocytes: 0.07 10*3/uL (ref 0.00–0.07)
Basophils Absolute: 0 10*3/uL (ref 0.0–0.1)
Basophils Relative: 0 %
Eosinophils Absolute: 0.1 10*3/uL (ref 0.0–0.5)
Eosinophils Relative: 1 %
HCT: 35.2 % — ABNORMAL LOW (ref 39.0–52.0)
Hemoglobin: 10.7 g/dL — ABNORMAL LOW (ref 13.0–17.0)
Immature Granulocytes: 1 %
Lymphocytes Relative: 12 %
Lymphs Abs: 1.6 10*3/uL (ref 0.7–4.0)
MCH: 25.3 pg — ABNORMAL LOW (ref 26.0–34.0)
MCHC: 30.4 g/dL (ref 30.0–36.0)
MCV: 83.2 fL (ref 80.0–100.0)
Monocytes Absolute: 1.2 10*3/uL — ABNORMAL HIGH (ref 0.1–1.0)
Monocytes Relative: 8 %
Neutro Abs: 11.2 10*3/uL — ABNORMAL HIGH (ref 1.7–7.7)
Neutrophils Relative %: 78 %
Platelets: 315 10*3/uL (ref 150–400)
RBC: 4.23 MIL/uL (ref 4.22–5.81)
RDW: 18.6 % — ABNORMAL HIGH (ref 11.5–15.5)
WBC: 14.2 10*3/uL — ABNORMAL HIGH (ref 4.0–10.5)
nRBC: 0 % (ref 0.0–0.2)

## 2020-12-10 LAB — URINALYSIS, ROUTINE W REFLEX MICROSCOPIC
Bilirubin Urine: NEGATIVE
Glucose, UA: NEGATIVE mg/dL
Ketones, ur: NEGATIVE mg/dL
Nitrite: NEGATIVE
Protein, ur: 100 mg/dL — AB
RBC / HPF: >50 RBC/hpf — ABNORMAL HIGH (ref 0–5)
Specific Gravity, Urine: 1.02 (ref 1.005–1.030)
WBC, UA: >50 WBC/hpf — ABNORMAL HIGH (ref 0–5)
pH: 7 (ref 5.0–8.0)

## 2020-12-10 LAB — COMPREHENSIVE METABOLIC PANEL
ALT: 13 U/L (ref 0–44)
AST: 18 U/L (ref 15–41)
Albumin: 3 g/dL — ABNORMAL LOW (ref 3.5–5.0)
Alkaline Phosphatase: 47 U/L (ref 38–126)
Anion gap: 14 (ref 5–15)
BUN: 41 mg/dL — ABNORMAL HIGH (ref 8–23)
CO2: 22 mmol/L (ref 22–32)
Calcium: 9.1 mg/dL (ref 8.9–10.3)
Chloride: 101 mmol/L (ref 98–111)
Creatinine, Ser: 2.38 mg/dL — ABNORMAL HIGH (ref 0.61–1.24)
GFR, Estimated: 26 mL/min — ABNORMAL LOW (ref >60)
Glucose, Bld: 118 mg/dL — ABNORMAL HIGH (ref 70–99)
Potassium: 3.6 mmol/L (ref 3.5–5.1)
Sodium: 137 mmol/L (ref 135–145)
Total Bilirubin: 1.1 mg/dL (ref 0.3–1.2)
Total Protein: 7.4 g/dL (ref 6.5–8.1)

## 2020-12-10 NOTE — ED Triage Notes (Addendum)
Per his step-daughter, the pt may have pulled out his catheter this morning. She called the urologist and he wants the catheter put back in. The pt had a coude catheter placed 1 week ago. Pt states that he did not pull it out, it came out and he has since urinated 3 times without any problem. Pt denies pain. Per the step-daughter, the pt has been confused since he had the urine infection. The pt brought the catheter, and there is red-colored urine in the bag.

## 2020-12-10 NOTE — ED Provider Notes (Signed)
[facility]-EMERGENCY DEPT Provider Note   CSN: [id number] Arrival date & time: 12/10/20  1306     History Chief Complaint  Patient presents with   Foley catheter dislodged    Corey Cummings is a 87 y.o. male with medical history as listed below.  Patient presents to the emergency department with a chief complaint of Foley catheter dislodged.  Patient's daughter at bedside reports that patient's Foley catheter became dislodged this morning.  She believes that the patient was manipulating catheter due to issue with a collection bag.  Patient reports that he did not pull out the catheter and and insist that it fell out.  Patient reports that he has urinated 3 times since catheter was removed.  Endorses dysuria and hematuria.  Patient daughter reports that patient had some confusion last night.  He has been staying in her house since being discharged from the hospital on 8/31.   HPI     Past Medical History:  Diagnosis Date   BPH (benign prostatic hyperplasia)    Hypertension     Patient Active Problem List   Diagnosis Date Noted   Severe sepsis (HCC) 12/04/2020   Lactic acidosis 12/04/2020   Acute cystitis without hematuria 12/04/2020   Foley catheter problem (HCC) 12/04/2020   Unilateral recurrent inguinal hernia without obstruction or gangrene 12/04/2020   Acute pyelonephritis 12/04/2020   BPH (benign prostatic hyperplasia) 12/04/2020   Chronic indwelling Foley catheter 12/04/2020   Hypertensive urgency 12/04/2020   CKD (chronic kidney disease), stage III (HCC) 12/04/2020   Acute pulmonary embolism (HCC) 02/24/2020   Ureteral obstruction, right 02/24/2020   UTI (urinary tract infection) 02/24/2020   Renal insufficiency 02/24/2020   Thoracic aortic aneurysm without rupture (HCC) 02/24/2020   Bronchiectasis (HCC) 02/24/2020   Pulmonary embolism (HCC) 02/24/2020   Hydronephrosis with urinary obstruction due to ureteral calculus     Past Surgical History:   Procedure Laterality Date   APPENDECTOMY     CYSTOSCOPY W/ URETERAL STENT PLACEMENT Right 12/04/2020   Procedure: CYSTOSCOPY WITH RETROGRADE PYELOGRAM/URETERAL STENT PLACEMENT;  Surgeon: Jannifer Hick, MD;  Location: [facility];  Service: Urology;  Laterality: Right;       History reviewed. No pertinent family history.  Social History   Tobacco Use   Smoking status: Former    Types: Cigarettes   Smokeless tobacco: Never   Tobacco comments:    Quit 40 years ago  Substance Use Topics   Alcohol use: Not Currently   Drug use: Not Currently    Home Medications Prior to Admission medications   Medication Sig Start Date End Date Taking? Authorizing Provider  amLODipine (NORVASC) 5 MG tablet Take 1 tablet (5 mg total) by mouth daily. 12/07/20   Erick Blinks, MD  ciprofloxacin (CIPRO) 500 MG tablet Take 1 tablet (500 mg total) by mouth 2 (two) times daily. 12/07/20   Erick Blinks, MD  finasteride (PROSCAR) 5 MG tablet Take 5 mg by mouth daily. 01/12/20   [provider]  Iron-FA-B Cmp-C-Biot-Probiotic (FUSION PLUS) CAPS Take 1 capsule by mouth daily. 11/28/20   [provider]  olmesartan (BENICAR) 20 MG tablet Take 20 mg by mouth daily. 09/09/19   [provider]  RIVAROXABAN Carlena Hurl) VTE STARTER PACK (15 & 20 MG) Take 20 mg by mouth daily. 12/07/20   Erick Blinks, MD  tamsulosin (FLOMAX) 0.4 MG CAPS capsule Take 0.4 mg by mouth daily. 10/20/20   [provider]    Allergies    Patient has  no known allergies.  Review of Systems   Review of Systems  Constitutional:  Negative for chills and fever.  Eyes:  Negative for visual disturbance.  Respiratory:  Negative for shortness of breath.   Cardiovascular:  Negative for chest pain.  Gastrointestinal:  Negative for abdominal pain, nausea and vomiting.  Genitourinary:  Positive for dysuria and hematuria. Negative for difficulty urinating, flank pain, penile pain, penile swelling, scrotal swelling and  testicular pain.  Musculoskeletal:  Negative for back pain and neck pain.  Skin:  Negative for color change and rash.  Neurological:  Negative for dizziness, syncope, light-headedness and headaches.  Psychiatric/Behavioral:  Negative for confusion.    Physical Exam Updated Vital Signs BP 119/85   Pulse 82   Temp 97.7 F (36.5 C) (Oral)   Resp 16   Ht 5\' 9"  (1.753 m)   Wt 66 kg   SpO2 100%   BMI 21.49 kg/m   Physical Exam Vitals and nursing note reviewed.  Constitutional:      General: He is not in acute distress.    Appearance: He is not ill-appearing, toxic-appearing or diaphoretic.  HENT:     Head: Normocephalic.  Eyes:     General: No scleral icterus.       Right eye: No discharge.        Left eye: No discharge.  Cardiovascular:     Rate and Rhythm: Normal rate.  Pulmonary:     Effort: Pulmonary effort is normal.  Abdominal:     General: Abdomen is flat. Bowel sounds are normal. There is no distension. There are no signs of injury.     Palpations: Abdomen is soft. There is no mass or pulsatile mass.     Tenderness: There is abdominal tenderness. There is no guarding or rebound.     Hernia: A hernia is present. Hernia is present in the right inguinal area. There is no hernia in the umbilical area or ventral area.     Comments: Patient has distention to suprapubic abdomen.  Right inguinal hernia noted, easily reducible.  Skin:    General: Skin is warm and dry.  Neurological:     General: No focal deficit present.     Mental Status: He is alert and oriented to person, place, and time.     GCS: GCS eye subscore is 4. GCS verbal subscore is 5. GCS motor subscore is 6.     Comments: Patient is alert to person, place, and time.  Psychiatric:        Behavior: Behavior is cooperative.    ED Results / Procedures / Treatments   Labs (all labs ordered are listed, but only abnormal results are displayed) Labs Reviewed  URINALYSIS, ROUTINE W REFLEX MICROSCOPIC - Abnormal;  Notable for the following components:      Result Value   Color, Urine BROWN (*)    APPearance TURBID (*)    Hgb urine dipstick LARGE (*)    Protein, ur 100 (*)    Leukocytes,Ua MODERATE (*)    RBC / HPF >50 (*)    WBC, UA >50 (*)    Bacteria, UA RARE (*)    All other components within normal limits  COMPREHENSIVE METABOLIC PANEL - Abnormal; Notable for the following components:   Glucose, Bld 118 (*)    BUN 41 (*)    Creatinine, Ser 2.38 (*)    Albumin 3.0 (*)    GFR, Estimated 26 (*)    All other components within normal limits  CBC WITH DIFFERENTIAL/PLATELET - Abnormal; Notable for the following components:   WBC 14.2 (*)    Hemoglobin 10.7 (*)    HCT 35.2 (*)    MCH 25.3 (*)    RDW 18.6 (*)    Neutro Abs 11.2 (*)    Monocytes Absolute 1.2 (*)    All other components within normal limits  URINE CULTURE    EKG None  Radiology No results found.  Procedures Procedures   Medications Ordered in ED Medications - No data to display  ED Course  I have reviewed the triage vital signs and the nursing notes.  Pertinent labs & imaging results that were available during my care of the patient were reviewed by me and considered in my medical decision making (see chart for details).    MDM Rules/Calculators/A&P                           Alert 87 year old male no acute distress, nontoxic appearing.  Patient is alert to person, place, and time.  Presents due to Foley catheter becoming dislodged.  On physical exam patient has distention to suprapubic abdomen, suspect continued urinary retention.  Will replace catheter, obtain urinalysis.  We will also obtain basic lab work due to previous reports of confusion.  Notified by RN that patient had 1100 mL of urine output after Foley catheter placement.  Suprapubic distention resolved after Foley catheter placement.  Urinalysis shows bacteria rare, WBC greater than 50, RBC greater than 50, leukocytes moderate, nitrite negative.   Compared to previous urinalysis obtained nitrites have cleared.  CMP shows AKI with creatinine elevated at 2.38 and BUN elevated at 41.  Suspect this is secondary to urinary retention.  CBC shows leukocytosis at 14.2.  Patient is afebrile and nontoxic-appearing.  Patient is currently taking Cipro for previous urinary tract infection.  Will discharge patient at this time.  Patient to have close follow-up with urology.  Discussed results, findings, treatment and follow up. Patient and patient's daughter advised of return precautions. Patient and patient's verbalized understanding and agreed with plan.  Patient care and treatment were discussed with attending physician Dr. Rush Landmark.   Final Clinical Impression(s) / ED Diagnoses Final diagnoses:  Urinary catheter complication, initial encounter (HCC)  AKI (acute kidney injury) [facility])  Urinary retention    Rx / DC Orders ED Discharge Orders     None        Haskel Schroeder, PA-C 12/11/20 0157    Tegeler, Canary Brim, MD 12/11/20 [phone]

## 2020-12-10 NOTE — Discharge Instructions (Addendum)
You came to the emergency department today due to your catheter coming out.  While your catheter was out he had retention of urine.  This retention of urine caused an acute kidney injury.  With the retention removed your kidney function should gradually improve.  Please call your urologist on Tuesday to schedule a follow-up appointment.  Get help right away if: You develop symptoms of worsening kidney disease, which include: Headaches. Abnormally dark or light skin. Easy bruising. Frequent hiccups. Chest pain. Shortness of breath. End of menstruation in women. Seizures. Confusion or altered mental status. Abdominal or back pain. Itchiness. You have a fever. Your body is producing less urine. You have pain or bleeding when you urinate.

## 2020-12-10 NOTE — ED Provider Notes (Signed)
Emergency Medicine Provider Triage Evaluation Note  Corey Cummings , a 87 y.o. male  was evaluated in triage.  Pt pulled out his foley catheter this morning. He has had a catheter since January for urinary retention. Urologist wants catheter replaced today. Last replaced last week. He also had a UTI last week that he is being treated with Cipro for, however, step daughter states that he has been more confused over the past week.  Review of Systems  Positive: Urinary retention, hematuria, confusion Negative: Fever, chills, abdominal pain, nausea, vomiting  Physical Exam  BP 135/83 (BP Location: Left Arm)   Pulse 89   Temp 97.7 F (36.5 C) (Oral)   Resp 16   Ht 5\' 9"  (1.753 m)   Wt 66 kg   SpO2 99%   BMI 21.49 kg/m  Gen:   Awake, no distress; fidgety Resp:  Normal effort  MSK:   Moves extremities without difficulty  Other:  Foley bag with blood noted. Alert and Oriented x 3 but very fidgety and impulsive  Medical Decision Making  Medically screening exam initiated at 3:03 PM.  Appropriate orders placed.  Corey Cummings was informed that the remainder of the evaluation will be completed by another provider, this initial triage assessment does not replace that evaluation, and the importance of remaining in the ED until their evaluation is complete.   [name] 12/10/20 1506    02/09/21, MD 12/11/20 1308

## 2020-12-12 LAB — URINE CULTURE: Culture: NO GROWTH

## 2020-12-14 ENCOUNTER — Encounter (HOSPITAL_COMMUNITY): Payer: Self-pay

## 2020-12-14 ENCOUNTER — Inpatient Hospital Stay (HOSPITAL_COMMUNITY)
Admission: EM | Admit: 2020-12-14 | Discharge: 2020-12-29 | DRG: 698 | Disposition: A | Discharge status: Skilled Nursing Facility | Payer: Medicare HMO | Attending: Internal Medicine | Admitting: Internal Medicine

## 2020-12-14 ENCOUNTER — Inpatient Hospital Stay (HOSPITAL_COMMUNITY): Payer: Medicare HMO

## 2020-12-14 ENCOUNTER — Emergency Department (HOSPITAL_COMMUNITY): Payer: Medicare HMO

## 2020-12-14 ENCOUNTER — Other Ambulatory Visit: Payer: Self-pay

## 2020-12-14 DIAGNOSIS — T83518A Infection and inflammatory reaction due to other urinary catheter, initial encounter: Principal | ICD-10-CM | POA: Diagnosis present

## 2020-12-14 DIAGNOSIS — Z86718 Personal history of other venous thrombosis and embolism: Secondary | ICD-10-CM

## 2020-12-14 DIAGNOSIS — Z960 Presence of urogenital implants: Secondary | ICD-10-CM | POA: Diagnosis present

## 2020-12-14 DIAGNOSIS — Z515 Encounter for palliative care: Secondary | ICD-10-CM | POA: Diagnosis not present

## 2020-12-14 DIAGNOSIS — R54 Age-related physical debility: Secondary | ICD-10-CM | POA: Diagnosis present

## 2020-12-14 DIAGNOSIS — N210 Calculus in bladder: Secondary | ICD-10-CM | POA: Diagnosis present

## 2020-12-14 DIAGNOSIS — K4090 Unilateral inguinal hernia, without obstruction or gangrene, not specified as recurrent: Secondary | ICD-10-CM | POA: Diagnosis present

## 2020-12-14 DIAGNOSIS — T83511A Infection and inflammatory reaction due to indwelling urethral catheter, initial encounter: Secondary | ICD-10-CM | POA: Diagnosis not present

## 2020-12-14 DIAGNOSIS — Z86711 Personal history of pulmonary embolism: Secondary | ICD-10-CM | POA: Diagnosis present

## 2020-12-14 DIAGNOSIS — W19XXXA Unspecified fall, initial encounter: Secondary | ICD-10-CM | POA: Diagnosis present

## 2020-12-14 DIAGNOSIS — L8990 Pressure ulcer of unspecified site, unspecified stage: Secondary | ICD-10-CM | POA: Insufficient documentation

## 2020-12-14 DIAGNOSIS — N136 Pyonephrosis: Secondary | ICD-10-CM | POA: Diagnosis present

## 2020-12-14 DIAGNOSIS — R443 Hallucinations, unspecified: Secondary | ICD-10-CM | POA: Diagnosis present

## 2020-12-14 DIAGNOSIS — I712 Thoracic aortic aneurysm, without rupture, unspecified: Secondary | ICD-10-CM | POA: Diagnosis present

## 2020-12-14 DIAGNOSIS — N320 Bladder-neck obstruction: Secondary | ICD-10-CM | POA: Diagnosis present

## 2020-12-14 DIAGNOSIS — E870 Hyperosmolality and hypernatremia: Secondary | ICD-10-CM | POA: Diagnosis not present

## 2020-12-14 DIAGNOSIS — Y738 Miscellaneous gastroenterology and urology devices associated with adverse incidents, not elsewhere classified: Secondary | ICD-10-CM | POA: Diagnosis present

## 2020-12-14 DIAGNOSIS — Y846 Urinary catheterization as the cause of abnormal reaction of the patient, or of later complication, without mention of misadventure at the time of the procedure: Secondary | ICD-10-CM | POA: Diagnosis present

## 2020-12-14 DIAGNOSIS — Z681 Body mass index (BMI) 19 or less, adult: Secondary | ICD-10-CM | POA: Diagnosis not present

## 2020-12-14 DIAGNOSIS — Z7189 Other specified counseling: Secondary | ICD-10-CM | POA: Diagnosis not present

## 2020-12-14 DIAGNOSIS — N400 Enlarged prostate without lower urinary tract symptoms: Secondary | ICD-10-CM | POA: Diagnosis present

## 2020-12-14 DIAGNOSIS — Z79899 Other long term (current) drug therapy: Secondary | ICD-10-CM

## 2020-12-14 DIAGNOSIS — R404 Transient alteration of awareness: Secondary | ICD-10-CM

## 2020-12-14 DIAGNOSIS — I129 Hypertensive chronic kidney disease with stage 1 through stage 4 chronic kidney disease, or unspecified chronic kidney disease: Secondary | ICD-10-CM | POA: Diagnosis present

## 2020-12-14 DIAGNOSIS — N401 Enlarged prostate with lower urinary tract symptoms: Secondary | ICD-10-CM | POA: Diagnosis present

## 2020-12-14 DIAGNOSIS — Z20822 Contact with and (suspected) exposure to covid-19: Secondary | ICD-10-CM | POA: Diagnosis present

## 2020-12-14 DIAGNOSIS — N179 Acute kidney failure, unspecified: Secondary | ICD-10-CM | POA: Diagnosis present

## 2020-12-14 DIAGNOSIS — K4091 Unilateral inguinal hernia, without obstruction or gangrene, recurrent: Secondary | ICD-10-CM | POA: Diagnosis present

## 2020-12-14 DIAGNOSIS — T368X5A Adverse effect of other systemic antibiotics, initial encounter: Secondary | ICD-10-CM | POA: Diagnosis present

## 2020-12-14 DIAGNOSIS — L89153 Pressure ulcer of sacral region, stage 3: Secondary | ICD-10-CM | POA: Diagnosis present

## 2020-12-14 DIAGNOSIS — T83091A Other mechanical complication of indwelling urethral catheter, initial encounter: Secondary | ICD-10-CM | POA: Diagnosis present

## 2020-12-14 DIAGNOSIS — E43 Unspecified severe protein-calorie malnutrition: Secondary | ICD-10-CM | POA: Insufficient documentation

## 2020-12-14 DIAGNOSIS — G9341 Metabolic encephalopathy: Secondary | ICD-10-CM | POA: Diagnosis present

## 2020-12-14 DIAGNOSIS — R4182 Altered mental status, unspecified: Secondary | ICD-10-CM

## 2020-12-14 DIAGNOSIS — E871 Hypo-osmolality and hyponatremia: Secondary | ICD-10-CM | POA: Diagnosis present

## 2020-12-14 DIAGNOSIS — Z66 Do not resuscitate: Secondary | ICD-10-CM | POA: Diagnosis present

## 2020-12-14 DIAGNOSIS — R0902 Hypoxemia: Secondary | ICD-10-CM | POA: Diagnosis present

## 2020-12-14 DIAGNOSIS — D638 Anemia in other chronic diseases classified elsewhere: Secondary | ICD-10-CM | POA: Diagnosis present

## 2020-12-14 DIAGNOSIS — Z8619 Personal history of other infectious and parasitic diseases: Secondary | ICD-10-CM

## 2020-12-14 DIAGNOSIS — Z9181 History of falling: Secondary | ICD-10-CM

## 2020-12-14 DIAGNOSIS — R2681 Unsteadiness on feet: Secondary | ICD-10-CM | POA: Diagnosis present

## 2020-12-14 DIAGNOSIS — B3749 Other urogenital candidiasis: Secondary | ICD-10-CM | POA: Diagnosis present

## 2020-12-14 DIAGNOSIS — N202 Calculus of kidney with calculus of ureter: Secondary | ICD-10-CM | POA: Diagnosis present

## 2020-12-14 DIAGNOSIS — Z7901 Long term (current) use of anticoagulants: Secondary | ICD-10-CM

## 2020-12-14 DIAGNOSIS — Y92410 Unspecified street and highway as the place of occurrence of the external cause: Secondary | ICD-10-CM | POA: Diagnosis not present

## 2020-12-14 DIAGNOSIS — N1831 Chronic kidney disease, stage 3a: Secondary | ICD-10-CM | POA: Diagnosis present

## 2020-12-14 DIAGNOSIS — N390 Urinary tract infection, site not specified: Secondary | ICD-10-CM | POA: Diagnosis present

## 2020-12-14 DIAGNOSIS — N1330 Unspecified hydronephrosis: Secondary | ICD-10-CM | POA: Diagnosis not present

## 2020-12-14 DIAGNOSIS — F0390 Unspecified dementia without behavioral disturbance: Secondary | ICD-10-CM | POA: Diagnosis present

## 2020-12-14 DIAGNOSIS — E876 Hypokalemia: Secondary | ICD-10-CM | POA: Diagnosis present

## 2020-12-14 DIAGNOSIS — I714 Abdominal aortic aneurysm, without rupture: Secondary | ICD-10-CM | POA: Diagnosis present

## 2020-12-14 DIAGNOSIS — Z87891 Personal history of nicotine dependence: Secondary | ICD-10-CM

## 2020-12-14 DIAGNOSIS — N183 Chronic kidney disease, stage 3 unspecified: Secondary | ICD-10-CM | POA: Diagnosis not present

## 2020-12-14 DIAGNOSIS — D509 Iron deficiency anemia, unspecified: Secondary | ICD-10-CM | POA: Diagnosis present

## 2020-12-14 DIAGNOSIS — N132 Hydronephrosis with renal and ureteral calculous obstruction: Secondary | ICD-10-CM | POA: Diagnosis not present

## 2020-12-14 DIAGNOSIS — Z792 Long term (current) use of antibiotics: Secondary | ICD-10-CM

## 2020-12-14 DIAGNOSIS — Z634 Disappearance and death of family member: Secondary | ICD-10-CM

## 2020-12-14 DIAGNOSIS — I10 Essential (primary) hypertension: Secondary | ICD-10-CM | POA: Diagnosis not present

## 2020-12-14 DIAGNOSIS — N135 Crossing vessel and stricture of ureter without hydronephrosis: Secondary | ICD-10-CM | POA: Diagnosis not present

## 2020-12-14 DIAGNOSIS — R627 Adult failure to thrive: Secondary | ICD-10-CM | POA: Diagnosis present

## 2020-12-14 LAB — URINALYSIS, ROUTINE W REFLEX MICROSCOPIC
Bilirubin Urine: NEGATIVE
Glucose, UA: NEGATIVE mg/dL
Ketones, ur: NEGATIVE mg/dL
Nitrite: NEGATIVE
Protein, ur: 100 mg/dL — AB
RBC / HPF: >50 RBC/hpf — ABNORMAL HIGH (ref 0–5)
Specific Gravity, Urine: 1.014 (ref 1.005–1.030)
WBC, UA: >50 WBC/hpf — ABNORMAL HIGH (ref 0–5)
pH: 5 (ref 5.0–8.0)

## 2020-12-14 LAB — CBC WITH DIFFERENTIAL/PLATELET
Abs Immature Granulocytes: 0.04 10*3/uL (ref 0.00–0.07)
Basophils Absolute: 0.1 10*3/uL (ref 0.0–0.1)
Basophils Relative: 1 %
Eosinophils Absolute: 0.2 10*3/uL (ref 0.0–0.5)
Eosinophils Relative: 2 %
HCT: 29.3 % — ABNORMAL LOW (ref 39.0–52.0)
Hemoglobin: 8.9 g/dL — ABNORMAL LOW (ref 13.0–17.0)
Immature Granulocytes: 0 %
Lymphocytes Relative: 15 %
Lymphs Abs: 1.5 10*3/uL (ref 0.7–4.0)
MCH: 25.6 pg — ABNORMAL LOW (ref 26.0–34.0)
MCHC: 30.4 g/dL (ref 30.0–36.0)
MCV: 84.2 fL (ref 80.0–100.0)
Monocytes Absolute: 0.7 10*3/uL (ref 0.1–1.0)
Monocytes Relative: 7 %
Neutro Abs: 7.4 10*3/uL (ref 1.7–7.7)
Neutrophils Relative %: 75 %
Platelets: 246 10*3/uL (ref 150–400)
RBC: 3.48 MIL/uL — ABNORMAL LOW (ref 4.22–5.81)
RDW: 18.5 % — ABNORMAL HIGH (ref 11.5–15.5)
WBC: 9.8 10*3/uL (ref 4.0–10.5)
nRBC: 0 % (ref 0.0–0.2)

## 2020-12-14 LAB — COMPREHENSIVE METABOLIC PANEL
ALT: 12 U/L (ref 0–44)
AST: 17 U/L (ref 15–41)
Albumin: 2.3 g/dL — ABNORMAL LOW (ref 3.5–5.0)
Alkaline Phosphatase: 39 U/L (ref 38–126)
Anion gap: 7 (ref 5–15)
BUN: 39 mg/dL — ABNORMAL HIGH (ref 8–23)
CO2: 23 mmol/L (ref 22–32)
Calcium: 8.4 mg/dL — ABNORMAL LOW (ref 8.9–10.3)
Chloride: 108 mmol/L (ref 98–111)
Creatinine, Ser: 2.32 mg/dL — ABNORMAL HIGH (ref 0.61–1.24)
GFR, Estimated: 26 mL/min — ABNORMAL LOW (ref >60)
Glucose, Bld: 122 mg/dL — ABNORMAL HIGH (ref 70–99)
Potassium: 3.1 mmol/L — ABNORMAL LOW (ref 3.5–5.1)
Sodium: 138 mmol/L (ref 135–145)
Total Bilirubin: 0.6 mg/dL (ref 0.3–1.2)
Total Protein: 5.6 g/dL — ABNORMAL LOW (ref 6.5–8.1)

## 2020-12-14 LAB — SODIUM, URINE, RANDOM: Sodium, Ur: 72 mmol/L

## 2020-12-14 LAB — CREATININE, URINE, RANDOM: Creatinine, Urine: 48.48 mg/dL

## 2020-12-14 LAB — CBG MONITORING, ED: Glucose-Capillary: 114 mg/dL — ABNORMAL HIGH (ref 70–99)

## 2020-12-14 MED ORDER — SODIUM CHLORIDE 0.45 % IV SOLN: INTRAVENOUS | Status: DC

## 2020-12-14 MED ORDER — ACETAMINOPHEN 325 MG PO TABS: 650.0000 mg | ORAL_TABLET | Freq: Four times a day (QID) | ORAL | Status: DC | PRN

## 2020-12-14 MED ORDER — RISAQUAD PO CAPS
1.0000 | ORAL_CAPSULE | Freq: Every day | ORAL | Status: DC
  Filled 2020-12-14: qty 1

## 2020-12-14 MED ORDER — LORAZEPAM 2 MG/ML IJ SOLN: 0.5000 mg | Freq: Four times a day (QID) | INTRAMUSCULAR | Status: DC | PRN

## 2020-12-14 MED ORDER — ONDANSETRON HCL 4 MG/2ML IJ SOLN: 4.0000 mg | Freq: Four times a day (QID) | INTRAMUSCULAR | Status: DC | PRN

## 2020-12-14 MED ORDER — ONDANSETRON HCL 4 MG PO TABS: 4.0000 mg | ORAL_TABLET | Freq: Four times a day (QID) | ORAL | Status: DC | PRN

## 2020-12-14 MED ORDER — SODIUM CHLORIDE 0.9 % IV SOLN
1.0000 g | Freq: Once | INTRAVENOUS | Status: AC
  Administered 2020-12-14: 1 g via INTRAVENOUS
  Filled 2020-12-14: qty 10

## 2020-12-14 MED ORDER — RIVAROXABAN 10 MG PO TABS
20.0000 mg | ORAL_TABLET | Freq: Every day | ORAL | Status: DC
  Filled 2020-12-14: qty 1

## 2020-12-14 MED ORDER — LORAZEPAM 2 MG/ML IJ SOLN
0.5000 mg | Freq: Once | INTRAMUSCULAR | Status: AC
  Administered 2020-12-14: 0.5 mg via INTRAVENOUS
  Filled 2020-12-14: qty 1

## 2020-12-14 MED ORDER — CHLORHEXIDINE GLUCONATE CLOTH 2 % EX PADS
6.0000 | MEDICATED_PAD | Freq: Every day | CUTANEOUS | Status: DC
  Administered 2020-12-15 – 2020-12-29 (×15): 6 via TOPICAL

## 2020-12-14 MED ORDER — SODIUM CHLORIDE 0.9% FLUSH
3.0000 mL | Freq: Two times a day (BID) | INTRAVENOUS | Status: DC
  Administered 2020-12-14 – 2020-12-28 (×16): 3 mL via INTRAVENOUS

## 2020-12-14 MED ORDER — POTASSIUM CHLORIDE IN NACL 20-0.45 MEQ/L-% IV SOLN
INTRAVENOUS | Status: AC
  Filled 2020-12-14 (×7): qty 1000

## 2020-12-14 MED ORDER — FINASTERIDE 5 MG PO TABS: 5.0000 mg | ORAL_TABLET | Freq: Every day | ORAL | Status: DC

## 2020-12-14 MED ORDER — SODIUM CHLORIDE 0.9 % IV SOLN
2.0000 g | INTRAVENOUS | Status: AC
  Administered 2020-12-14 – 2020-12-18 (×5): 2 g via INTRAVENOUS
  Filled 2020-12-14 (×5): qty 2

## 2020-12-14 MED ORDER — ACETAMINOPHEN 650 MG RE SUPP: 650.0000 mg | Freq: Four times a day (QID) | RECTAL | Status: DC | PRN

## 2020-12-14 MED ORDER — TAMSULOSIN HCL 0.4 MG PO CAPS
0.4000 mg | ORAL_CAPSULE | Freq: Every day | ORAL | Status: DC
  Filled 2020-12-14: qty 1

## 2020-12-14 MED ORDER — AMLODIPINE BESYLATE 5 MG PO TABS: 5.0000 mg | ORAL_TABLET | Freq: Every day | ORAL | Status: DC

## 2020-12-14 NOTE — ED Notes (Signed)
Attempted to feed patient meal tray, unable to feed as patient is disoriented and refusing food at this time.

## 2020-12-14 NOTE — H&P (Addendum)
History and Physical    Corey Cummings:[id number] DOB: May 08, 1930 DOA: 12/14/2020  Referring MD/NP/PA: Harlene Salts, PA-C PCP: [name], MD  Patient coming from: Home living with daughter via EMS  Chief Complaint: Altered mental status  I have personally briefly reviewed patient's old medical records in [facility]   HPI: Corey Cummings is a 87 y.o. male with medical history significant of HTN, chronic indwelling Foley catheter, nephrolithiasis, ED/PE on Xarelto, and BPH presents with complaints of being noted to be acutely altered.  History is mostly obtained from the patient's daughter who is present at bedside as he is acutely altered.  At baseline patient had normally been alert and oriented x3.  Patient had just been hospitalized from 8/28 -8/31 for severe sepsis secondary to catheter associated urinary tract infection with hydronephrosis secondary to right UVJ calculus.  Urology has been c consulted and placed stents.  Urine cultures grew out Citrobacter freundii(resistant to cefazolin, Rocephin, and nitrofurantoin) and patient was discharged to complete course of ciprofloxacin.  Since discharge his daughter reported that he had been a little wobbly on his feet and had been using a walker when he previously walked without assistance.  He had been seen in the emergency department on 9/3 after he took out his Foley catheter.  His daughter notes that at that he had been acting weird and had been asking for a long clot.  Yesterday, the patient had woken up talking about going to get his car so him and his wife who passed away last year could go somewhere.  His daughter had taken him over to her aunts house to watch him while she was at work.  The patient was noted to be hallucinating talking about a lawn mower when none was outside.  When she got the patient back home the patient had gone outside and fell in the street.  His daughter and next-door neighbor had to help patient up to get  back in the house.  He did not complain of any injuries.  This morning around 4 AM patient was moving stuff around the house was attempting to leave.  His daughter tried to block the door, but he shoved her out of the way and went outside.  Patient reportedly fell again with no reported trauma to his head.  His daughter notes that he had not been acting like his normal self and thought that maybe he was on too much medicine as the cause of symptoms.  He denies any complaints at this time, but is unsure of where he is and why he is here.  To the daughter's knowledge he never had any issues with medications like this before.  ED Course: On admission to the emergency department patient was seen to be afebrile with blood pressure 151/84, and all other vital signs maintained.  CT scan of the brain showed no acute abnormalities.  Labs significant for hemoglobin 8.9, potassium 3.1, BUN 39, creatinine 2.39.  UA positive for large hemoglobin large leukocytes, many bacteria, greater than 50 RBCs, greater than 50 WBCs.  X-rays of the chest and pelvis showed no acute abnormality.  Renal CT noted interval placement of the right double-J stent with tips coiled in the collecting system and distal ureter adjacent to large UVJ stone with improvement in right hydronephrosis, concern for possible continued infection, improvement in left hydronephrosis with mild residual dilation of the collecting system, unchanged morphology of Horsch urine culture was obtained.  Review of Systems  Unable to perform  ROS: Mental status change  Constitutional:  Negative for fever.  Respiratory:  Negative for shortness of breath.   Cardiovascular:  Negative for chest pain and leg swelling.  Gastrointestinal:  Negative for abdominal pain.  Musculoskeletal:  Positive for falls. Negative for myalgias.  Skin:  Negative for rash.  Psychiatric/Behavioral:  Positive for hallucinations. Negative for substance abuse. The patient has insomnia.     Past Medical History:  Diagnosis Date   BPH (benign prostatic hyperplasia)    Hypertension     Past Surgical History:  Procedure Laterality Date   APPENDECTOMY     CYSTOSCOPY W/ URETERAL STENT PLACEMENT Right 12/04/2020   Procedure: CYSTOSCOPY WITH RETROGRADE PYELOGRAM/URETERAL STENT PLACEMENT;  Surgeon: Jannifer Hick, MD;  Location: [facility];  Service: Urology;  Laterality: Right;     reports that he has quit smoking. His smoking use included cigarettes. He has never used smokeless tobacco. He reports that he does not currently use alcohol. He reports that he does not currently use drugs.  No Known Allergies  History reviewed. No pertinent family history.  Prior to Admission medications   Medication Sig Start Date End Date Taking? Authorizing Provider  amLODipine (NORVASC) 5 MG tablet Take 1 tablet (5 mg total) by mouth daily. 12/07/20  Yes Erick Blinks, MD  ciprofloxacin (CIPRO) 500 MG tablet Take 1 tablet (500 mg total) by mouth 2 (two) times daily. 12/07/20  Yes Erick Blinks, MD  finasteride (PROSCAR) 5 MG tablet Take 5 mg by mouth at bedtime. 01/12/20  Yes [provider]  Iron-FA-B Cmp-C-Biot-Probiotic (FUSION PLUS) CAPS Take 1 capsule by mouth daily. 11/28/20  Yes [provider]  olmesartan (BENICAR) 20 MG tablet Take 20 mg by mouth at bedtime. 09/09/19  Yes [provider]  rivaroxaban (XARELTO) 20 MG TABS tablet Take 20 mg by mouth daily with supper.   Yes [provider]  tamsulosin (FLOMAX) 0.4 MG CAPS capsule Take 0.4 mg by mouth daily. 10/20/20  Yes [provider]  RIVAROXABAN Carlena Hurl) VTE STARTER PACK (15 & 20 MG) Take 20 mg by mouth daily. Patient not taking: No sig reported 12/07/20   Erick Blinks, MD    Physical Exam:  Constitutional: Elderly male who appears to be actively trying to get out of the hospital bed Vitals:   12/14/20 0637 12/14/20 0641  BP: (!) 151/84   Pulse: 86   Resp: 16   Temp: 98.7 F (37.1  C)   SpO2: 100% 100%   Eyes: PERRL, lids and conjunctivae normal ENMT: Mucous membranes are dry. Posterior pharynx clear of any exudate or lesions.  Neck: normal, supple, no masses, no thyromegaly Respiratory: clear to auscultation bilaterally, no wheezing, no crackles. Normal respiratory effort. No accessory muscle use.  Cardiovascular: Regular rate and rhythm, no murmurs / rubs / gallops. No extremity edema. 2+ pedal pulses. No carotid bruits.  Abdomen: no tenderness, no masses palpated. No hepatosplenomegaly. Bowel sounds positive.  Musculoskeletal: no clubbing / cyanosis. No joint deformity upper and lower extremities. Good ROM, no contractures. Normal muscle tone.  Skin: no rashes, lesions, ulcers. No induration Neurologic: CN 2-12 grossly intact. Sensation intact, DTR normal. Strength 5/5 in all 4.  Psychiatric: Poor judgment.  Alert and oriented to person and able to state the year 2022, but not oriented to place or situation.    Labs on Admission: I have personally reviewed following labs and imaging studies  CBC: Recent Labs  Lab 12/10/20 1957 12/14/20 0655  WBC 14.2* 9.8  NEUTROABS 11.2*  7.4  HGB 10.7* 8.9*  HCT 35.2* 29.3*  MCV 83.2 84.2  PLT 315 246   Basic Metabolic Panel: Recent Labs  Lab 12/10/20 1957 12/14/20 0655  NA 137 138  K 3.6 3.1*  CL 101 108  CO2 22 23  GLUCOSE 118* 122*  BUN 41* 39*  CREATININE 2.38* 2.32*  CALCIUM 9.1 8.4*   GFR: Estimated Creatinine Clearance: 20.5 mL/min (A) (by C-G formula based on SCr of 2.32 mg/dL (H)). Liver Function Tests: Recent Labs  Lab 12/10/20 1957 12/14/20 0655  AST 18 17  ALT 13 12  ALKPHOS 47 39  BILITOT 1.1 0.6  PROT 7.4 5.6*  ALBUMIN 3.0* 2.3*   No results for input(s): LIPASE, AMYLASE in the last 168 hours. No results for input(s): AMMONIA in the last 168 hours. Coagulation Profile: No results for input(s): INR, PROTIME in the last 168 hours. Cardiac Enzymes: No results for input(s): CKTOTAL,  CKMB, CKMBINDEX, TROPONINI in the last 168 hours. BNP (last 3 results) No results for input(s): PROBNP in the last 8760 hours. HbA1C: No results for input(s): HGBA1C in the last 72 hours. CBG: Recent Labs  Lab 12/14/20 0634  GLUCAP 114*   Lipid Profile: No results for input(s): CHOL, HDL, LDLCALC, TRIG, CHOLHDL, LDLDIRECT in the last 72 hours. Thyroid Function Tests: No results for input(s): TSH, T4TOTAL, FREET4, T3FREE, THYROIDAB in the last 72 hours. Anemia Panel: No results for input(s): VITAMINB12, FOLATE, FERRITIN, TIBC, IRON, RETICCTPCT in the last 72 hours. Urine analysis:    Component Value Date/Time   COLORURINE YELLOW 12/14/2020 0655   APPEARANCEUR TURBID (A) 12/14/2020 0655   LABSPEC 1.014 12/14/2020 0655   PHURINE 5.0 12/14/2020 0655   GLUCOSEU NEGATIVE 12/14/2020 0655   HGBUR LARGE (A) 12/14/2020 0655   BILIRUBINUR NEGATIVE 12/14/2020 0655   KETONESUR NEGATIVE 12/14/2020 0655   PROTEINUR 100 (A) 12/14/2020 0655   NITRITE NEGATIVE 12/14/2020 0655   LEUKOCYTESUR LARGE (A) 12/14/2020 0655   Sepsis Labs: Recent Results (from the past 240 hour(s))  Urine Culture     Status: Abnormal   Collection Time: 12/04/20 12:03 PM   Specimen: Urine, Clean Catch  Result Value Ref Range Status   Specimen Description   Final    URINE, CLEAN CATCH Performed at [facility], [address] [place], [place] [address]    Special Requests   Final    NONE Performed at [facility], [address] [place], [place] [address]    Culture >=100,000 COLONIES/mL CITROBACTER FREUNDII (A)  Final   Report Status 12/06/2020 FINAL  Final   Organism ID, Bacteria CITROBACTER FREUNDII (A)  Final      Susceptibility   Citrobacter freundii - MIC*    CEFAZOLIN >=64 RESISTANT Resistant     CEFEPIME <=0.12 SENSITIVE Sensitive     CEFTRIAXONE 16 RESISTANT Resistant     CIPROFLOXACIN <=0.25 SENSITIVE Sensitive     GENTAMICIN <=1 SENSITIVE Sensitive      IMIPENEM 1 SENSITIVE Sensitive     NITROFURANTOIN 256 RESISTANT Resistant     TRIMETH/SULFA <=20 SENSITIVE Sensitive     PIP/TAZO <=4 SENSITIVE Sensitive     * >=100,000 COLONIES/mL CITROBACTER FREUNDII  Blood Culture (routine x 2)     Status: None   Collection Time: 12/04/20  2:18 PM   Specimen: BLOOD  Result Value Ref Range Status   Specimen Description   Final    BLOOD BLOOD LEFT FOREARM Performed at [facility], [address]  [place], [place] [address]    Special Requests   Final    BOTTLES DRAWN AEROBIC AND ANAEROBIC Blood Culture adequate volume Performed at [facility], [address], [place], [place] [address]    Culture   Final    NO GROWTH 5 DAYS Performed at [facility], [address] [place], [place] [address]    Report Status 12/09/2020 FINAL  Final  Blood Culture (routine x 2)     Status: None   Collection Time: 12/04/20  2:18 PM   Specimen: BLOOD  Result Value Ref Range Status   Specimen Description   Final    BLOOD RIGHT ANTECUBITAL Performed at [facility], [address] [place], [place] [address]    Special Requests   Final    BOTTLES DRAWN AEROBIC AND ANAEROBIC Blood Culture results may not be optimal due to an excessive volume of blood received in culture bottles Performed at [facility], [address] [place], [place] [address]    Culture   Final    NO GROWTH 5 DAYS Performed at [facility], [address], [place], [place] [address]    Report Status 12/09/2020 FINAL  Final  Resp Panel by RT-PCR (Flu A&B, Covid) Nasopharyngeal Swab     Status: None   Collection Time: 12/04/20  3:12 PM   Specimen: Nasopharyngeal Swab; Nasopharyngeal(NP) swabs in vial transport medium  Result Value Ref Range Status   SARS Coronavirus 2 by RT PCR NEGATIVE NEGATIVE Final    Comment: (NOTE) SARS-CoV-2 target nucleic acids are NOT DETECTED.  The SARS-CoV-2 RNA is  generally detectable in upper respiratory specimens during the acute phase of infection. The lowest concentration of SARS-CoV-2 viral copies this assay can detect is 138 copies/mL. A negative result does not preclude SARS-Cov-2 infection and should not be used as the sole basis for treatment or other patient management decisions. A negative result may occur with  improper specimen collection/handling, submission of specimen other than nasopharyngeal swab, presence of viral mutation(s) within the areas targeted by this assay, and inadequate number of viral copies(<138 copies/mL). A negative result must be combined with clinical observations, patient history, and epidemiological information. The expected result is Negative.  Fact Sheet for Patients:  BloggerCourse.com  Fact Sheet for Healthcare Providers:  SeriousBroker.it  This test is no t yet approved or cleared by the [place] FDA and  has been authorized for detection and/or diagnosis of SARS-CoV-2 by FDA under an Emergency Use Authorization (EUA). This EUA will remain  in effect (meaning this test can be used) for the duration of the COVID-19 declaration under Section 564(b)(1) of the Act, 21 U.S.C.section 360bbb-3(b)(1), unless the authorization is terminated  or revoked sooner.       Influenza A by PCR NEGATIVE NEGATIVE Final   Influenza B by PCR NEGATIVE NEGATIVE Final    Comment: (NOTE) The Xpert Xpress SARS-CoV-2/FLU/RSV plus assay is intended as an aid in the diagnosis of influenza from Nasopharyngeal swab specimens and should not be used as a sole basis for treatment. Nasal washings and aspirates are unacceptable for Xpert Xpress SARS-CoV-2/FLU/RSV testing.  Fact Sheet for Patients: BloggerCourse.com  Fact Sheet for Healthcare Providers: SeriousBroker.it  This test is not yet approved or cleared by the [place] FDA and has been authorized for detection and/or diagnosis of SARS-CoV-2 by FDA under an Emergency Use Authorization (EUA). This EUA will remain in effect (meaning this test can be used) for  the duration of the COVID-19 declaration under Section 564(b)(1) of the Act, 21 U.S.C. section 360bbb-3(b)(1), unless the authorization is terminated or revoked.  Performed at [facility], [address], [place], [place] [address]   Urine Culture     Status: None   Collection Time: 12/10/20  7:45 PM   Specimen: In/Out Cath Urine  Result Value Ref Range Status   Specimen Description   Final    IN/OUT CATH URINE Performed at [facility], [address], [place], [place] [address]    Special Requests   Final    NONE Performed at [facility], [address] [place], [place] [address]    Culture   Final    NO GROWTH Performed at [facility], [address] [place], [place] [address]    Report Status 12/12/2020 FINAL  Final     Radiological Exams on Admission: DG Chest 1 View  Result Date: 12/14/2020 CLINICAL DATA:  Fall. EXAM: CHEST  1 VIEW COMPARISON:  Multiple chest radiographs, most recently 12/04/2020. CT chest, 02/24/2020. FINDINGS: Cardiac silhouette is within normal limits. Tortuous thoracic aorta. Lungs are well inflated. No focal consolidation or mass. No pleural effusion or pneumothorax. No acute displaced fracture. IMPRESSION: Normal chest. Electronically Signed   By: Roanna Banning M.D.   On: 12/14/2020 07:32   DG Pelvis 1-2 Views  Result Date: 12/14/2020 CLINICAL DATA:  Fall. EXAM: PELVIS - 1-2 VIEW COMPARISON:  CT Abdomen Pelvis, 12/04/2020. FINDINGS: There is no evidence of pelvic fracture or diastasis. No pelvic bone lesions are seen. Large RIGHT distal ureteral stone. RIGHT nephroureteral catheter, incompletely imaged. IMPRESSION: 1. No acute fracture dislocation within the pelvis or proximal femurs. 2. Large  RIGHT distal ureteral nephrolith, with nephroureteral catheter. Electronically Signed   By: Roanna Banning M.D.   On: 12/14/2020 07:35   CT HEAD WO CONTRAST ( )  Result Date: 12/14/2020 CLINICAL DATA:  Facial trauma EXAM: CT HEAD WITHOUT CONTRAST TECHNIQUE: Contiguous axial images were obtained from the base of the skull through the vertex without intravenous contrast. COMPARISON:  None. FINDINGS: Motion limited study.  Within this limitation: Brain: No evidence of acute infarction, hemorrhage, hydrocephalus, extra-axial collection or mass lesion/mass effect. Mild for age scattered white matter hypodensities, nonspecific but compatible with chronic microvascular ischemic disease. Mild for age atrophy with ex vacuo ventricular dilation. Vascular: Calcific atherosclerosis. No hyperdense vessel identified. Skull: No acute fracture. Sinuses/Orbits: Clear sinuses.  No acute orbital finding. Other: No mastoid effusions. IMPRESSION: 1. No evidence of acute intracranial abnormality on this motion limited exam. 2. Mild for age chronic microvascular ischemic disease and atrophy. Electronically Signed   By: Feliberto Harts M.D.   On: 12/14/2020 07:31   CT Renal Stone Study  Result Date: 12/14/2020 CLINICAL DATA:  Acute confusion, recent discharge from sepsis due to recurrent UTIs, history of indwelling Foley catheter, bilateral hydronephrosis EXAM: CT ABDOMEN AND PELVIS WITHOUT CONTRAST TECHNIQUE: Multidetector CT imaging of the abdomen and pelvis was performed following the standard protocol without IV contrast. COMPARISON:  CT abdomen/pelvis 12/04/2020 FINDINGS: Lower chest: The lung bases are clear. The imaged heart is unremarkable. Hepatobiliary: The liver and gallbladder are unremarkable. There is no biliary ductal dilatation. Pancreas: Unremarkable. Spleen: Unremarkable. Adrenals/Urinary Tract: The adrenals are unremarkable. Horseshoe kidney morphology is again seen. There has been interval placement of a  right-sided double-J stent with tips coiled in the proximal collecting system and distal ureter. The previously seen right-sided hydronephrosis has markedly improved  though not entirely resolved. The large calculus at the UVJ measuring 3.0 cm is unchanged in position. Marked atrophy of the right renal parenchyma with multiple additional nonobstructing renal stones are similar to the prior study. A small locule of air remains in the patulous renal pelvis (3-32). Previously seen perinephric and periureteral stranding has improved. Left hydronephrosis has significantly improved with mild residual dilation of the renal pelvis. A Foley catheter tip is in the bladder. A left bladder diverticulum is again seen. Stomach/Bowel: The stomach is unremarkable. There is no evidence of bowel obstruction. There is no definite bowel Furnari thickening or inflammatory change. There is colonic diverticulosis without evidence of acute diverticulitis. Vascular/Lymphatic: There is calcified atherosclerotic plaque throughout the nonaneurysmal abdominal aorta. There is no abdominal or pelvic lymphadenopathy. Reproductive: The prostate is markedly enlarged and impresses upon the inferior aspect of the bladder. The seminal vesicles are unremarkable. Other: There is trace fluid in the presacral space, nonspecific. There is no free intraperitoneal air. A right inguinal hernia containing a short loop of unobstructed bowel is again seen. Musculoskeletal: There is no acute osseous abnormality or aggressive osseous lesion. Compression deformity of the L1 vertebral body is unchanged. IMPRESSION: 1. Interval placement of a right double-J stent with tips coiled in the collecting system and distal ureter adjacent to the large UVJ stone which is unchanged in position. Previously seen severe right hydronephrosis has markedly improved. 2. Small locule of air remains in the patulous right collecting system which may be related to interval intervention.  Persistent infection is not entirely excluded by imaging, though previously seen perinephric and periureteral stranding has significantly improved. 3. Significantly improved left hydronephrosis with only mild residual dilation of the collecting system. 4. Unchanged horseshoe morphology of the kidneys with marked atrophy of the right moiety. Additional nonobstructing calculi in the right are overall unchanged. 5.  Aortic Atherosclerosis (ICD10-I70.0). Electronically Signed   By: Lesia Hausen M.D.   On: 12/14/2020 08:52    Chest x-ray: Independently reviewed.  No acute abnormality appreciated  Assessment/Plan Acute metabolic encephalopathy: Patient presents after being noted to be more confused at home.  Patient had been on ciprofloxacin for treatment of urinary tract infection.  CT scan of the brain without any acute abnormality.  Suspect symptoms likely could be coming from ciprofloxacin, but other causes include untreated infection -Admit to medical telemetry bed -Neurochecks -Set bed alarm on -Discontinue ciprofloxacin -Ativan IV as needed agitation -Safety sitter -May warrant further work-up, if symptoms do not seem to be improving with discontinuation of the medication  Complicated catheter associated urinary tract infection/pyelonephritis  obstructive 3 cm staghorn calculus of the right UVJ junction left hydronephrosis without ureteral stone: Patient presents with UA significant for large leukocytes, large hemoglobin, many bacteria, and greater than 50 WBCs.  Patient had been taking ciprofloxacin since being discharged from the hospital where previous cultures from 8/28 grew out Citrobacter frunedii.  Cultures were resistant to cefazolin, ceftriaxone, and nitrofurantoin.  Patient had initially been placed on Rocephin IV, but based off previous cultures this would not adequately treat.  CT scan of the abdomen pelvis significant for improvement in hydronephrosis with small lobular air in the right  collecting system for which infection could not be ruled out and improvement in left-sided hydronephrosis with change or true morphology. -Follow-up urine culture -Discontinue Rocephin -IV cefepime -Recommend continue outpatient follow-up with urology  Acute kidney injury superimposed on chronic kidney disease stage IIIa: Patient presents with creatinine elevated up to 2.32 with BUN 39.  Baseline creatinine had been around 1.36 at discharge on 8/28.  Suspect possibly related with ciprofloxacin induced kidney injury. -Monitor intake and output -Check CK  -Check urine sodium and urine creatinine -IV fluids as seen above -Hold nephrotoxic agents  Hypokalemia: Acute.  Potassium noted to be 3.1 on admission.  Patient is unable to tolerate fluids at this time. -0.45% normal saline IV fluids with 20 mEq of potassium at 75 mL/h  -Continue to potassium levels and adjust fluids as needed  Gait disturbance falls at home: At baseline patient previously had been able to ambulate without assistance prior to coming into the hospital the end of August.  Daughter reports patient off balance and has had 2 falls since being discharged from the hospital and needing to use a walker at this time.  X-ray imaging did not show any acute fractures. -PT/OT to eval and treat tomorrow  -Transitions of care consult  Essential hypertension: Blood pressures mildly elevated at 151/84.  Home blood pressure medications include amlodipine 5 mg daily and losartan 20 mg nightly -Held olmesartan due to AKI -Continue amlodipine   Hypochromic anemia: Acute on chronic.  Hemoglobin 8.9 g/dL on admission, but previously noted to be 10.7 on 9/3, and 9.2 on 8/31.  UA was positive for blood, but no other reports of bleeding.  He is otherwise hemodynamically stable at this time for -Continue to monitor blood counts  History of DVT/PE on chronic anticoagulation: Patient noted to have age-indeterminate DVT of the left popliteal vein as  well as an acute pulmonary embolus in 02/2020, but left leg DVT again seen on Doppler ultrasound of the lower extremities on 12/05/2020.   -Continue Xarelto  AAA: Patient known to have dilation of the thoracic aortic arch measuring 3.3 cm in diameter mass noted in 02/2020. -Continue outpatient follow-up and surveillance  Inguinal hernia: Again noted on CT scan with small loops of bowel present without signs of obstruction. -Recommend outpatient follow-up with surgery  BPH -Continue Proscar finasteride  DVT prophylaxis: Xarelto Code Status: Full Family Communication: Daughter updated at bedside Disposition Plan: To be determined Consults called: None Admission status: Inpatient, require more than 2 midnight stay  Clydie Braun MD [facility] Hospitalists   If 7PM-7AM, please contact night-coverage   12/14/2020, 9:14 AM

## 2020-12-14 NOTE — ED Notes (Signed)
Patient transported to CT 

## 2020-12-14 NOTE — ED Notes (Signed)
Attempted to feed dinner to pt, but the pt would not eat. RN notified.

## 2020-12-14 NOTE — ED Triage Notes (Signed)
Pt brought in via [facility] with c/c of confusion. Per EMS pt recently admitted for sepsis and UTI, before pt was A&O x4 able to drive. Family member states that since D/C 8/31, pt has been staying with her and this morning pt has progressively gotten worse. Torn apart living room looking for something and ran outside in street. Pt arrived with foley from previous admission. Pt taken Cipro for UTI.   150/90, 90HR, 94%RA, CBG 136
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
## 2020-12-14 NOTE — Progress Notes (Signed)
Pharmacy Antibiotic Note  Corey Cummings is a 87 y.o. male admitted on 12/14/2020 presenting with AMS, concern for UTI with chronic foley, recent UCx with citrobacter resistant to cefazolin/ceftriaxone .  Pharmacy has been consulted for cefepime dosing.  Plan: Cefepime 2g IV every 24 hours Monitor renal function, UCx and clinical progression to narrow     Temp (24hrs), Avg:98.7 F (37.1 C), Min:98.7 F (37.1 C), Max:98.7 F (37.1 C)  Recent Labs  Lab 12/10/20 1957 12/14/20 0655  WBC 14.2* 9.8  CREATININE 2.38* 2.32*    Estimated Creatinine Clearance: 20.5 mL/min (A) (by C-G formula based on SCr of 2.32 mg/dL (H)).    No Known Allergies  Daylene Posey, PharmD Clinical Pharmacist ED Pharmacist Phone # [phone] 12/14/2020 10:40 AM

## 2020-12-15 DIAGNOSIS — G9341 Metabolic encephalopathy: Secondary | ICD-10-CM | POA: Diagnosis not present

## 2020-12-15 LAB — BASIC METABOLIC PANEL
Anion gap: 7 (ref 5–15)
BUN: 37 mg/dL — ABNORMAL HIGH (ref 8–23)
CO2: 24 mmol/L (ref 22–32)
Calcium: 8.5 mg/dL — ABNORMAL LOW (ref 8.9–10.3)
Chloride: 110 mmol/L (ref 98–111)
Creatinine, Ser: 2.3 mg/dL — ABNORMAL HIGH (ref 0.61–1.24)
GFR, Estimated: 27 mL/min — ABNORMAL LOW (ref >60)
Glucose, Bld: 93 mg/dL (ref 70–99)
Potassium: 3.3 mmol/L — ABNORMAL LOW (ref 3.5–5.1)
Sodium: 141 mmol/L (ref 135–145)

## 2020-12-15 LAB — CBC
HCT: 32.5 % — ABNORMAL LOW (ref 39.0–52.0)
Hemoglobin: 10 g/dL — ABNORMAL LOW (ref 13.0–17.0)
MCH: 25.6 pg — ABNORMAL LOW (ref 26.0–34.0)
MCHC: 30.8 g/dL (ref 30.0–36.0)
MCV: 83.3 fL (ref 80.0–100.0)
Platelets: 275 10*3/uL (ref 150–400)
RBC: 3.9 MIL/uL — ABNORMAL LOW (ref 4.22–5.81)
RDW: 18.5 % — ABNORMAL HIGH (ref 11.5–15.5)
WBC: 9.2 10*3/uL (ref 4.0–10.5)
nRBC: 0 % (ref 0.0–0.2)

## 2020-12-15 LAB — URINE CULTURE
Culture: 80000 — AB
Special Requests: NORMAL

## 2020-12-15 LAB — CK: Total CK: 74 U/L (ref 49–397)

## 2020-12-15 LAB — MAGNESIUM: Magnesium: 1.6 mg/dL — ABNORMAL LOW (ref 1.7–2.4)

## 2020-12-15 MED ORDER — QUETIAPINE FUMARATE 25 MG PO TABS
25.0000 mg | ORAL_TABLET | Freq: Every day | ORAL | Status: DC
  Administered 2020-12-15 – 2020-12-19 (×5): 25 mg via ORAL
  Filled 2020-12-15 (×5): qty 1

## 2020-12-15 MED ORDER — HYDRALAZINE HCL 20 MG/ML IJ SOLN: 5.0000 mg | Freq: Four times a day (QID) | INTRAMUSCULAR | Status: DC | PRN

## 2020-12-15 MED ORDER — ENOXAPARIN SODIUM 80 MG/0.8ML IJ SOSY
1.0000 mg/kg | PREFILLED_SYRINGE | INTRAMUSCULAR | Status: DC
  Administered 2020-12-15 – 2020-12-16 (×2): 65 mg via SUBCUTANEOUS
  Filled 2020-12-15 (×2): qty 0.8

## 2020-12-15 MED ORDER — ENOXAPARIN SODIUM 80 MG/0.8ML IJ SOSY: 1.0000 mg/kg | PREFILLED_SYRINGE | INTRAMUSCULAR | Status: DC

## 2020-12-15 MED ORDER — MAGNESIUM SULFATE 2 GM/50ML IV SOLN
2.0000 g | Freq: Once | INTRAVENOUS | Status: AC
  Administered 2020-12-15: 2 g via INTRAVENOUS
  Filled 2020-12-15: qty 50

## 2020-12-15 NOTE — Progress Notes (Signed)
PROGRESS NOTE    Corey Cummings  MAU:[id number] DOB: 10-Oct-1930 DOA: 12/14/2020 PCP: [name], MD   Chief Complaint  Patient presents with   Altered Mental Status    Pt brought in via [facility] with c/c of confusion. Per EMS pt recently admitted for sepsis and UTI, before pt was A&O x4 able to drive. Family member states that since D/C 8/31, pt has been staying with her and this morning pt has progressively gotten worse. Torn apart living room looking for something and ran outside in street. Pt arrived with foley from previous admission. Pt taken Cipro for UTI.   150/90, 90HR, 94%RA, CBG 136      Brief Narrative: 87 year old male with hypertension chronic indwelling Foley catheter, nephrolithiasis, PE on Xarelto, BPH recently hospitalized 8/8-/1931 for severe sepsis due to catheter associated urinary tract infection with hydronephrosis secondary to right UVJ calculus, had a stent placed culture grew Citrobacter friendly and discharged on complete course of ciprofloxacin.  Since discharge patient has been unsteady on his feet, was seen in the ED 9/3 after he took out his Foley catheter.  He has been acting weird confused hallucinating at times agitated, has had falls. In the ED CT brain no acute finding, afebrile, labs showed hemoglobin 8.9 g potassium 3.1 creatinine 2.3 UA large leukocyte many bacteria more than 50 RBC multiple WBC chest x-ray and pelvis x-ray no acute finding, renal CT interval placement of right double-J stent with tip coiled in the collecting system and distal ureter adjacent to the large UVJ stone which is unchanged in position. Previously seen severe right hydronephrosis has markedly improved. 2. Small locule of air remains in the patulous right collecting system which may be related to interval intervention. Persistent infection is not entirely excluded by imaging, though previously seen perinephric and periureteral stranding has significantly improved. 3. Significantly  improved left hydronephrosis with only mild residual dilation of the collecting system. 4. Unchanged horseshoe morphology"   Cultures obtained, placed on antibiotics and admitted with one-to-one, IV Ativan as needed.  Subjective: Seen and examined this morning.  One-to-one at the bedside tech reports patient said no further for this morning and has been in sleep.  Received Ativan last night.  I am not able to wake him up. This afternoon patient worked with PT and walked-was asking something to drink Assessment & Plan:  Acute metabolic encephalopathy with agitation/hallucination, unsteady gait falls at home: Likely multifactorial in the setting of infection, could be due to ciprofloxacin use. Overnight one-to-one in place, received Ativan, this morning appears sedated unable to waking up, keep n.p.o., keep on a fluid hydration, continue supportive care with IV antibiotics.  CT head no acute finding on admission.  Continue delirium precaution supportive care fall precaution.  Patient appears to oversedated.  Discontinue Ativan can use as needed Zyprexa if needed.  Patient is more alert awake and participate with PT this afternoon.  We will start bedtime Seroquel low-dose to regulate his sleep cycle.  Complicated catheter associated UTI/pyelonephritis BPH Chronic indwelling Foley in place Right double-J stent placed on last admission in August Large UVJ stone unchanged in position: on last culture had Citrobacter frunedii.  Cultures were resistant to cefazolin, ceftriaxone, and nitrofurantoin.  Follow-up culture data continue IV cefepime for now.  Will need to follow-up with urology as outpatient.  Repeat CT abdomen as above-significantly improved left hydronephrosis, markedly improved previous right severe hydronephrosis, small locule of air in patulous right collecting system may be related to interval intervention, persistent infection  is not entirely excluded.  Resume Proscar finasteride once able  to take p.o.  AKI on CKD stage IIIa Baseline creatinine on last discharge 1.3.  Currently at 2.3, keep on IV fluid hydration minimize nephrotoxic medication.  CK normal, u na at 72. Recent Labs    02/26/20 0055 02/27/20 0411 04/29/20 2256 12/04/20 2536 12/05/20 0432 12/06/20 0448 12/07/20 0511 12/10/20 1957 12/14/20 0655 12/15/20 0030  BUN 24* 23 41* 26* 31* 33* 28* 41* 39* 37*  CREATININE 1.42* 1.43* 2.27* 1.55* 1.48* 1.35* 1.36* 2.38* 2.32* 2.30*   Hypomagnesemia we will replete Hypokalemia: replace w/ ivf Recent Labs  Lab 12/10/20 1957 12/14/20 0655 12/15/20 0030  K 3.6 3.1* 3.3*     Gait disturbance/falls at home: X-ray no acute fractures.  PT OT once mental status better.  Hypertension: On amlodipine and losartan at home.  Stable holding p.o. meds.  Holding ARB due to AKI.  Anemia: Stable, likely from chronic disease.  Monitor. Recent Labs  Lab 12/10/20 1957 12/14/20 0655 12/15/20 0030  HGB 10.7* 8.9* 10.0*  HCT 35.2* 29.3* 32.5*   History of DVT/PE on anticoagulation: Age indeterminate DVT in the left popliteal vein as well as acute PE in November/21, left leg DVT again seen on Doppler ultrasound on 12/05/2020.  While NPO switched to Lovenox subcu.  AAA, known to have dilation of thoracic aortic arch 3.3 centimeter in November/21, outpatient follow-up and surveillance will be needed  Inguinal hernia noted on CT scan with a small loops of bowel without signs of obstruction.  Outpatient follow-up with surgery.  Monitor bowel movement  Diet Order             Diet NPO time specified  Diet effective now                 Patient's There is no height or weight on file to calculate BMI. DVT prophylaxis: lovenox Code Status:   Code Status: Full Code  Family Communication: plan of care discussed with patient at bedside. Status is: Inpatient  Remains inpatient appropriate because:Ongoing diagnostic testing needed not appropriate for outpatient work up, IV  treatments appropriate due to intensity of illness or inability to take PO, and Inpatient level of care appropriate due to severity of illness  Dispo: The patient is from: Home              Anticipated d/c is to:  TBD              Patient currently is not medically stable to d/c.   Difficult to place patient No Unresulted Labs (From admission, onward)    None       Medications reviewed:  Scheduled Meds:  acidophilus  1 capsule Oral Daily   amLODipine  5 mg Oral Daily   Chlorhexidine Gluconate Cloth  6 each Topical Daily   enoxaparin (LOVENOX) injection  1 mg/kg Subcutaneous Q24H   finasteride  5 mg Oral QHS   sodium chloride flush  3 mL Intravenous Q12H   tamsulosin  0.4 mg Oral Daily   Continuous Infusions:  0.45 % NaCl with KCl 20 mEq / L 75 mL/hr at 12/15/20 0103   ceFEPime (MAXIPIME) IV Stopped (12/14/20 1151)   Consultants:see note  Procedures:see note Antimicrobials: Anti-infectives (From admission, onward)    Start     Dose/Rate Route Frequency Ordered Stop   12/14/20 1045  ceFEPIme (MAXIPIME) 2 g in sodium chloride 0.9 % 100 mL IVPB        2 g  200 mL/hr over 30 Minutes Intravenous Every 24 hours 12/14/20 1043     12/14/20 0800  cefTRIAXone (ROCEPHIN) 1 g in sodium chloride 0.9 % 100 mL IVPB        1 g 200 mL/hr over 30 Minutes Intravenous  Once 12/14/20 0751 12/14/20 0953      Culture/Microbiology    Component Value Date/Time   SDES URINE, CLEAN CATCH 12/14/2020 0655   SPECREQUEST  12/14/2020 0655    Normal Performed at [facility], [address], [place], [place] [address]    CULT 80,000 COLONIES/mL YEAST (A) 12/14/2020 0655   REPTSTATUS 12/15/2020 FINAL 12/14/2020 0655    Other culture-see note  Objective: Vitals: Today's Vitals   12/14/20 2034 12/14/20 2130 12/14/20 2258 12/15/20 0518  BP:  (!) 155/97 (!) 176/96 (!) 170/88  Pulse: 83 74 82 83  Resp: 20 19 16 16   Temp:  97.9 F (36.6 C) 98 F (36.7 C)   TempSrc:  Axillary Axillary    SpO2: 96% 95% 95% 100%  PainSc:        Intake/Output Summary (Last 24 hours) at 12/15/2020 1042 Last data filed at 12/15/2020 0600 Gross per 24 hour  Intake 820.13 ml  Output 1600 ml  Net -779.87 ml   There were no vitals filed for this visit. Weight change:   Intake/Output from previous day: 09/07 0701 - 09/08 0700 In: 919.1 [I.V.:720.1; IV Piggyback:199] Out: 1600 [Urine:1600] Intake/Output this shift: No intake/output data recorded. There were no vitals filed for this visit. Examination: General exam: Sedated/sleeping , elderly frail  HEENT:Oral mucosa moist, Ear/Nose WNL grossly,dentition normal. Respiratory system: bilaterally diminished, no added sounds no use of accessory muscle, non tender. Cardiovascular system: S1 & S2 +,No JVD. Gastrointestinal system: Abdomen soft, NT,ND, BS+. Nervous System:sedated/sleeping Extremities: NO edema, distal peripheral pulses palpable.  Skin: No rashes,no icterus. MSK: Normal muscle bulk,tone, power Foley catheter in place  Data Reviewed: I have personally reviewed following labs and imaging studies CBC: Recent Labs  Lab 12/10/20 1957 12/14/20 0655 12/15/20 0030  WBC 14.2* 9.8 9.2  NEUTROABS 11.2* 7.4  --   HGB 10.7* 8.9* 10.0*  HCT 35.2* 29.3* 32.5*  MCV 83.2 84.2 83.3  PLT 315 246 275   Basic Metabolic Panel: Recent Labs  Lab 12/10/20 1957 12/14/20 0655 12/15/20 0030  NA 137 138 141  K 3.6 3.1* 3.3*  CL 101 108 110  CO2 22 23 24   GLUCOSE 118* 122* 93  BUN 41* 39* 37*  CREATININE 2.38* 2.32* 2.30*  CALCIUM 9.1 8.4* 8.5*  MG  --   --  1.6*   GFR: Estimated Creatinine Clearance: 20.7 mL/min (A) (by C-G formula based on SCr of 2.3 mg/dL (H)). Liver Function Tests: Recent Labs  Lab 12/10/20 1957 12/14/20 0655  AST 18 17  ALT 13 12  ALKPHOS 47 39  BILITOT 1.1 0.6  PROT 7.4 5.6*  ALBUMIN 3.0* 2.3*   No results for input(s): LIPASE, AMYLASE in the last 168 hours. No results for input(s): AMMONIA in the last  168 hours. Coagulation Profile: No results for input(s): INR, PROTIME in the last 168 hours. Cardiac Enzymes: Recent Labs  Lab 12/15/20 0030  CKTOTAL 74   BNP (last 3 results) No results for input(s): PROBNP in the last 8760 hours. HbA1C: No results for input(s): HGBA1C in the last 72 hours. CBG: Recent Labs  Lab 12/14/20 0634  GLUCAP 114*   Lipid Profile: No results for input(s): CHOL, HDL, LDLCALC, TRIG, CHOLHDL, LDLDIRECT  in the last 72 hours. Thyroid Function Tests: No results for input(s): TSH, T4TOTAL, FREET4, T3FREE, THYROIDAB in the last 72 hours. Anemia Panel: No results for input(s): VITAMINB12, FOLATE, FERRITIN, TIBC, IRON, RETICCTPCT in the last 72 hours. Sepsis Labs: No results for input(s): PROCALCITON, LATICACIDVEN in the last 168 hours.  Recent Results (from the past 240 hour(s))  Urine Culture     Status: None   Collection Time: 12/10/20  7:45 PM   Specimen: In/Out Cath Urine  Result Value Ref Range Status   Specimen Description   Final    IN/OUT CATH URINE Performed at [facility], [address] [place], [place] [address]    Special Requests   Final    NONE Performed at [facility], [address] [place], [place] [address]    Culture   Final    NO GROWTH Performed at [facility], [address] [place], [place] [address]    Report Status 12/12/2020 FINAL  Final  Urine Culture     Status: Abnormal   Collection Time: 12/14/20  6:55 AM   Specimen: Urine, Clean Catch  Result Value Ref Range Status   Specimen Description URINE, CLEAN CATCH  Final   Special Requests   Final    Normal Performed at [facility], [address] [place], [place] [address]    Culture 80,000 COLONIES/mL YEAST (A)  Final   Report Status 12/15/2020 FINAL  Final     Radiology Studies: DG Chest 1 View  Result Date: 12/14/2020 CLINICAL DATA:  Fall. EXAM: CHEST  1 VIEW COMPARISON:  Multiple chest radiographs, most  recently 12/04/2020. CT chest, 02/24/2020. FINDINGS: Cardiac silhouette is within normal limits. Tortuous thoracic aorta. Lungs are well inflated. No focal consolidation or mass. No pleural effusion or pneumothorax. No acute displaced fracture. IMPRESSION: Normal chest. Electronically Signed   By: Roanna BanningJon  Mugweru M.D.   On: 12/14/2020 07:32   DG Pelvis 1-2 Views  Result Date: 12/14/2020 CLINICAL DATA:  Fall. EXAM: PELVIS - 1-2 VIEW COMPARISON:  CT Abdomen Pelvis, 12/04/2020. FINDINGS: There is no evidence of pelvic fracture or diastasis. No pelvic bone lesions are seen. Large RIGHT distal ureteral stone. RIGHT nephroureteral catheter, incompletely imaged. IMPRESSION: 1. No acute fracture dislocation within the pelvis or proximal femurs. 2. Large RIGHT distal ureteral nephrolith, with nephroureteral catheter. Electronically Signed   By: Roanna BanningJon  Mugweru M.D.   On: 12/14/2020 07:35   CT HEAD WO CONTRAST (5MM)  Result Date: 12/14/2020 CLINICAL DATA:  Facial trauma EXAM: CT HEAD WITHOUT CONTRAST TECHNIQUE: Contiguous axial images were obtained from the base of the skull through the vertex without intravenous contrast. COMPARISON:  None. FINDINGS: Motion limited study.  Within this limitation: Brain: No evidence of acute infarction, hemorrhage, hydrocephalus, extra-axial collection or mass lesion/mass effect. Mild for age scattered white matter hypodensities, nonspecific but compatible with chronic microvascular ischemic disease. Mild for age atrophy with ex vacuo ventricular dilation. Vascular: Calcific atherosclerosis. No hyperdense vessel identified. Skull: No acute fracture. Sinuses/Orbits: Clear sinuses.  No acute orbital finding. Other: No mastoid effusions. IMPRESSION: 1. No evidence of acute intracranial abnormality on this motion limited exam. 2. Mild for age chronic microvascular ischemic disease and atrophy. Electronically Signed   By: Feliberto HartsFrederick S Jones M.D.   On: 12/14/2020 07:31   CT Cervical Spine Wo  Contrast  Result Date: 12/14/2020 CLINICAL DATA:  Neck trauma.  Altered mental status post fall. EXAM: CT CERVICAL SPINE WITHOUT CONTRAST TECHNIQUE: Multidetector CT  imaging of the cervical spine was performed without intravenous contrast. Multiplanar CT image reconstructions were also generated. COMPARISON:  Chest CTA 02/24/2020. FINDINGS: Alignment: Straightening without focal angulation or listhesis. Skull base and vertebrae: No evidence of acute fracture or traumatic subluxation. Prominent asymmetric facet arthropathy on the right at C2-3. Soft tissues and spinal canal: No prevertebral fluid or swelling. No visible canal hematoma. Disc levels: There is multilevel cervical spondylosis. At C2-3, uncinate spurring and asymmetric right-sided facet hypertrophy contribute to mild spinal stenosis and right foraminal narrowing. There is prominent posterior osteophyte formation on the right at C3-4 which may contribute to some mass effect on the cord and medial foraminal narrowing. There is chronic loss of disc height with uncinate spurring at C4-5 and C6-7. No large disc herniation identified. Upper chest: Asymmetric enlargement of the left thyroid lobe appears unchanged from previous chest CTA. No discrete nodule identified. Central airway thickening and scattered peribronchial nodularity of both lung apices, similar to previous CT. Other: None. IMPRESSION: 1. No evidence of acute cervical spine fracture or traumatic subluxation. 2. Multilevel cervical spondylosis as described. 3. Stable incidental findings including asymmetric enlargement of the left thyroid lobe, apical central airway thickening and peribronchial nodularity. Electronically Signed   By: Carey Bullocks M.D.   On: 12/14/2020 11:46   CT Renal Stone Study  Result Date: 12/14/2020 CLINICAL DATA:  Acute confusion, recent discharge from sepsis due to recurrent UTIs, history of indwelling Foley catheter, bilateral hydronephrosis EXAM: CT ABDOMEN AND  PELVIS WITHOUT CONTRAST TECHNIQUE: Multidetector CT imaging of the abdomen and pelvis was performed following the standard protocol without IV contrast. COMPARISON:  CT abdomen/pelvis 12/04/2020 FINDINGS: Lower chest: The lung bases are clear. The imaged heart is unremarkable. Hepatobiliary: The liver and gallbladder are unremarkable. There is no biliary ductal dilatation. Pancreas: Unremarkable. Spleen: Unremarkable. Adrenals/Urinary Tract: The adrenals are unremarkable. Horseshoe kidney morphology is again seen. There has been interval placement of a right-sided double-J stent with tips coiled in the proximal collecting system and distal ureter. The previously seen right-sided hydronephrosis has markedly improved though not entirely resolved. The large calculus at the UVJ measuring 3.0 cm is unchanged in position. Marked atrophy of the right renal parenchyma with multiple additional nonobstructing renal stones are similar to the prior study. A small locule of air remains in the patulous renal pelvis (3-32). Previously seen perinephric and periureteral stranding has improved. Left hydronephrosis has significantly improved with mild residual dilation of the renal pelvis. A Foley catheter tip is in the bladder. A left bladder diverticulum is again seen. Stomach/Bowel: The stomach is unremarkable. There is no evidence of bowel obstruction. There is no definite bowel Biswell thickening or inflammatory change. There is colonic diverticulosis without evidence of acute diverticulitis. Vascular/Lymphatic: There is calcified atherosclerotic plaque throughout the nonaneurysmal abdominal aorta. There is no abdominal or pelvic lymphadenopathy. Reproductive: The prostate is markedly enlarged and impresses upon the inferior aspect of the bladder. The seminal vesicles are unremarkable. Other: There is trace fluid in the presacral space, nonspecific. There is no free intraperitoneal air. A right inguinal hernia containing a short  loop of unobstructed bowel is again seen. Musculoskeletal: There is no acute osseous abnormality or aggressive osseous lesion. Compression deformity of the L1 vertebral body is unchanged. IMPRESSION: 1. Interval placement of a right double-J stent with tips coiled in the collecting system and distal ureter adjacent to the large UVJ stone which is unchanged in position. Previously seen severe right hydronephrosis has markedly improved. 2. Small locule of air  remains in the patulous right collecting system which may be related to interval intervention. Persistent infection is not entirely excluded by imaging, though previously seen perinephric and periureteral stranding has significantly improved. 3. Significantly improved left hydronephrosis with only mild residual dilation of the collecting system. 4. Unchanged horseshoe morphology of the kidneys with marked atrophy of the right moiety. Additional nonobstructing calculi in the right are overall unchanged. 5.  Aortic Atherosclerosis (ICD10-I70.0). Electronically Signed   By: Lesia Hausen M.D.   On: 12/14/2020 08:52     LOS: 1 day   Lanae Boast, MD [facility]  12/15/2020, 10:42 AM

## 2020-12-15 NOTE — Evaluation (Signed)
Occupational Therapy Evaluation Patient Details Name: Corey Cummings MRN: [id number] DOB: [birth year] Today's Date: 12/15/2020    History of Present Illness 87 y.o. male presents to [facility] ED on 12/14/2020 with AMS. Pt recently hospitalized 8/28-8/31 with sepsis 2/2 UTI. Pt admitted for acute metabolic encephalopathy and UTI management.PMH includes HTN, chronic indwelling Foley catheter, nephrolithiasis, ED/PE on Xarelto, and BPH.   Clinical Impression   Pt was living alone, his step daughter stayed with him in the days leading up to admission. He reports his neighbor brings him meals, but he is independent in self care, drives and gets his own groceries. Pt appears to have poor hygiene, he is generally weak and demonstrates poor standing balance. He requires set up to max assist for ADL and min assist for OOB. Recommending SNF for rehab upon discharge.     Follow Up Recommendations  SNF;Supervision/Assistance - 24 hour    Equipment Recommendations  Other (comment) (defer to next venue)    Recommendations for Other Services       Precautions / Restrictions Precautions Precautions: Fall Restrictions Weight Bearing Restrictions: No      Mobility Bed Mobility Overal bed mobility: Needs Assistance Bed Mobility: Supine to Sit;Sit to Supine     Supine to sit: Supervision Sit to supine: Supervision        Transfers Overall transfer level: Needs assistance Equipment used: 1 person hand held assist Transfers: Sit to/from Stand Sit to Stand: Min guard              Balance Overall balance assessment: Needs assistance Sitting-balance support: No upper extremity supported;Feet supported Sitting balance-Leahy Scale: Good     Standing balance support: Single extremity supported Standing balance-Leahy Scale: Poor Standing balance comment: reliant on UE support and minA                           ADL either performed or assessed with clinical judgement   ADL Overall  ADL's : Needs assistance/impaired Eating/Feeding: NPO   Grooming: Sitting (shaving)   Upper Body Bathing: Minimal assistance;Sitting   Lower Body Bathing: Maximal assistance;Sit to/from stand   Upper Body Dressing : Minimal assistance;Sitting   Lower Body Dressing: Maximal assistance;Sit to/from stand   Toilet Transfer: Minimal assistance;Ambulation           Functional mobility during ADLs: Minimal assistance       Vision Patient Visual Report: No change from baseline       Perception     Praxis      Pertinent Vitals/Pain Pain Assessment: No/denies pain     Hand Dominance Right   Extremity/Trunk Assessment Upper Extremity Assessment Upper Extremity Assessment: Generalized weakness   Lower Extremity Assessment Lower Extremity Assessment: Defer to PT evaluation   Cervical / Trunk Assessment Cervical / Trunk Assessment: Kyphotic   Communication Communication Communication: No difficulties   Cognition Arousal/Alertness: Awake/alert Behavior During Therapy: WFL for tasks assessed/performed Overall Cognitive Status: Impaired/Different from baseline Area of Impairment: Safety/judgement;Awareness;Problem solving;Memory                     Memory: Decreased short-term memory   Safety/Judgement: Decreased awareness of safety Awareness: Emergent Problem Solving: Slow processing;Requires verbal cues General Comments: pt with poor hygiene, reports he showers daily, but then stated he had not taken off his socks since last hospitalization, feet washed and dried thoroughly   General Comments  pt on 2L [place] initially, PT weans to room air with stable  sats in high 90s-100%    Exercises     Shoulder Instructions      Home Living Family/patient expects to be discharged to:: Private residence Living Arrangements: Alone Available Help at Discharge: Family;Available PRN/intermittently;Neighbor Type of Home: House Home Access: Stairs to enter [employer] of Steps: 2   Home Layout: One level     Bathroom Shower/Tub: Producer, television/film/video: Standard     Home Equipment: Environmental consultant - 2 wheels;Cane - single point;Grab bars - tub/shower;Shower seat          Prior Functioning/Environment Level of Independence: Independent with assistive device(s)        Comments: pt is a poor historian, reports he drives, gets his groceries, but neighbor helps with meals, pt with poor hygiene        OT Problem List: Decreased strength;Impaired balance (sitting and/or standing);Decreased knowledge of use of DME or AE;Decreased cognition;Decreased safety awareness      OT Treatment/Interventions: Self-care/ADL training;DME and/or AE instruction;Therapeutic activities;Cognitive remediation/compensation;Patient/family education;Balance training    OT Goals(Current goals can be found in the care plan section) Acute Rehab OT Goals Patient Stated Goal: to improve strength and reduce falls risk OT Goal Formulation: With patient Time For Goal Achievement: 12/29/20 Potential to Achieve Goals: Good ADL Goals Pt Will Perform Grooming: with supervision;standing Pt Will Perform Lower Body Bathing: with supervision;sit to/from stand Pt Will Perform Lower Body Dressing: with supervision;sit to/from stand Pt Will Transfer to Toilet: with supervision;ambulating;regular height toilet Pt Will Perform Toileting - Clothing Manipulation and hygiene: with supervision;sit to/from stand  OT Frequency: Min 2X/week   Barriers to D/C:            Co-evaluation              AM-PAC OT "6 Clicks" Daily Activity     Outcome Measure Help from another person eating meals?: A Little Help from another person taking care of personal grooming?: A Little Help from another person toileting, which includes using toliet, bedpan, or urinal?: A Little Help from another person bathing (including washing, rinsing, drying)?: A Lot Help from another person to  put on and taking off regular upper body clothing?: A Little Help from another person to put on and taking off regular lower body clothing?: A Lot 6 Click Score: 16   End of Session    Activity Tolerance: Patient tolerated treatment well Patient left: in bed;with call bell/phone within reach;with nursing/sitter in room  OT Visit Diagnosis: Unsteadiness on feet (R26.81);Other abnormalities of gait and mobility (R26.89);Muscle weakness (generalized) (M62.81);Other symptoms and signs involving cognitive function                Time: 1336-1400 OT Time Calculation (min): 24 min Charges:  OT General Charges $OT Visit: 1 Visit OT Evaluation $OT Eval Moderate Complexity: 1 Mod OT Treatments $Self Care/Home Management : 8-22 mins  Martie Round, OTR/L Acute Rehabilitation Services Pager: [phone] Office: [phone]   Evern Bio 12/15/2020, 2:48 PM

## 2020-12-15 NOTE — Evaluation (Signed)
Physical Therapy Evaluation Patient Details Name: Corey Cummings MRN: [id number] DOB: July 11, 1930 Today's Date: 12/15/2020   History of Present Illness  87 y.o. male presents to [facility] ED on 12/14/2020 with AMS. Pt recently hospitalized 8/28-8/31 with sepsis 2/2 UTI. Pt admitted for acute metabolic encephalopathy and UTI management.PMH includes HTN, chronic indwelling Foley catheter, nephrolithiasis, ED/PE on Xarelto, and BPH.  Clinical Impression  Pt presents to PT with deficits in balance, strength, power, cognition, endurance, gait. Pt demonstrates instability when standing and ambulating, requiring UE support as well as PT assistance to correct losses of balance. Pt will benefit from continued aggressive mobilization to improve balance and restore independence in mobility. PT recommends SNF placement at this time due to high falls risk and impaired cognition.    Follow Up Recommendations SNF;Supervision for mobility/OOB    Equipment Recommendations  3in1 (PT)    Recommendations for Other Services       Precautions / Restrictions Precautions Precautions: Fall Restrictions Weight Bearing Restrictions: No      Mobility  Bed Mobility Overal bed mobility: Needs Assistance Bed Mobility: Supine to Sit;Sit to Supine     Supine to sit: Supervision Sit to supine: Supervision        Transfers Overall transfer level: Needs assistance Equipment used:  (IV pole) Transfers: Sit to/from Stand Sit to Stand: Min guard            Ambulation/Gait Ambulation/Gait assistance: Min assist Gait Distance (Feet): 20 Feet (20' x 2 trials) Assistive device: IV Pole Gait Pattern/deviations: Step-to pattern;Wide base of support;Staggering left;Staggering right Gait velocity: reduced Gait velocity interpretation: <1.8 ft/sec, indicate of risk for recurrent falls General Gait Details: pt with slowed step-to gait, multiple lateral losses of balance, some improvement with balance when utilizing UE  support of IV pole  Stairs            Wheelchair Mobility    Modified Rankin (Stroke Patients Only)       Balance Overall balance assessment: Needs assistance Sitting-balance support: No upper extremity supported;Feet supported Sitting balance-Leahy Scale: Good     Standing balance support: Single extremity supported Standing balance-Leahy Scale: Poor Standing balance comment: reliant on UE support and minA                             Pertinent Vitals/Pain Pain Assessment: No/denies pain    Home Living Family/patient expects to be discharged to:: Private residence Living Arrangements: Alone Available Help at Discharge: Family;Available PRN/intermittently Type of Home: House Home Access: Stairs to enter   Entrance Stairs-Number of Steps: 2 Home Layout: One level Home Equipment: Walker - 2 wheels;Cane - single point;Grab bars - tub/shower;Shower seat      Prior Function Level of Independence: Independent with assistive device(s)         Comments: pt reports PRN use of cane, still mows lawn. History will need to be confirmed with daughter     Hand Dominance   Dominant Hand: Right    Extremity/Trunk Assessment   Upper Extremity Assessment Upper Extremity Assessment: Generalized weakness    Lower Extremity Assessment Lower Extremity Assessment: Generalized weakness    Cervical / Trunk Assessment Cervical / Trunk Assessment: Kyphotic  Communication   Communication:  (garbled speech at times)  Cognition Arousal/Alertness: Awake/alert Behavior During Therapy: WFL for tasks assessed/performed Overall Cognitive Status: Impaired/Different from baseline Area of Impairment: Safety/judgement;Awareness;Problem solving  Safety/Judgement: Decreased awareness of safety Awareness: Emergent Problem Solving: Slow processing;Requires verbal cues        General Comments General comments (skin integrity, edema,  etc.): pt on 2L [place] initially, PT weans to room air with stable sats in high 90s-100%    Exercises     Assessment/Plan    PT Assessment Patient needs continued PT services  PT Problem List Decreased strength;Decreased activity tolerance;Decreased balance;Decreased mobility;Decreased cognition;Decreased safety awareness       PT Treatment Interventions DME instruction;Gait training;Stair training;Functional mobility training;Therapeutic activities;Therapeutic exercise;Balance training;Cognitive remediation;Neuromuscular re-education;Patient/family education    PT Goals (Current goals can be found in the Care Plan section)  Acute Rehab PT Goals Patient Stated Goal: to improve strength and reduce falls risk PT Goal Formulation: With patient Time For Goal Achievement: 12/29/20 Potential to Achieve Goals: Good    Frequency Min 3X/week   Barriers to discharge        Co-evaluation               AM-PAC PT "6 Clicks" Mobility  Outcome Measure Help needed turning from your back to your side while in a flat bed without using bedrails?: A Little Help needed moving from lying on your back to sitting on the side of a flat bed without using bedrails?: A Little Help needed moving to and from a bed to a chair (including a wheelchair)?: A Little Help needed standing up from a chair using your arms (e.g., wheelchair or bedside chair)?: A Little Help needed to walk in hospital room?: A Little Help needed climbing 3-5 steps with a railing? : A Lot 6 Click Score: 17    End of Session   Activity Tolerance: Patient tolerated treatment well Patient left: in bed;with call bell/phone within reach;with nursing/sitter in room;with bed alarm set Nurse Communication: Mobility status PT Visit Diagnosis: Unsteadiness on feet (R26.81)    Time: 4562-5638 PT Time Calculation (min) (ACUTE ONLY): 20 min   Charges:   PT Evaluation $PT Eval Low Complexity: 1 Low          Arlyss Gandy, PT,  DPT Acute Rehabilitation Pager: [phone]   Arlyss Gandy 12/15/2020, 1:01 PM

## 2020-12-15 NOTE — Progress Notes (Signed)
ANTICOAGULATION CONSULT NOTE - Initial Consult  Pharmacy Consult for Xarelto >> Lovenox Indication: History of pulmonary embolus and DVT, current DVT  No Known Allergies  Vital Signs: Temp: 98 F (36.7 C) (09/07 2258) Temp Source: Axillary (09/07 2258) BP: 170/88 (09/08 0518) Pulse Rate: 83 (09/08 0518)  Labs: Recent Labs    12/14/20 0655 12/15/20 0030  HGB 8.9* 10.0*  HCT 29.3* 32.5*  PLT 246 275  CREATININE 2.32* 2.30*  CKTOTAL  --  74    Estimated Creatinine Clearance: 20.7 mL/min (A) (by C-G formula based on SCr of 2.3 mg/dL (H)).   Medical History: Past Medical History:  Diagnosis Date   BPH (benign prostatic hyperplasia)    Hypertension     Medications:  Medications Prior to Admission  Medication Sig Dispense Refill Last Dose   amLODipine (NORVASC) 5 MG tablet Take 1 tablet (5 mg total) by mouth daily. 30 tablet 1 12/13/2020   ciprofloxacin (CIPRO) 500 MG tablet Take 1 tablet (500 mg total) by mouth 2 (two) times daily. 20 tablet 0 12/13/2020   finasteride (PROSCAR) 5 MG tablet Take 5 mg by mouth at bedtime.   12/13/2020   Iron-FA-B Cmp-C-Biot-Probiotic (FUSION PLUS) CAPS Take 1 capsule by mouth daily.   12/13/2020   olmesartan (BENICAR) 20 MG tablet Take 20 mg by mouth at bedtime.   12/13/2020   rivaroxaban (XARELTO) 20 MG TABS tablet Take 20 mg by mouth daily with supper.   12/13/2020 at 1930   tamsulosin (FLOMAX) 0.4 MG CAPS capsule Take 0.4 mg by mouth daily.   12/13/2020    Assessment: Corey Cummings is an 87 yo male with a history of age-indeterminate DVT and acute PE in November 2021. During recent admission, dopplers showed left leg DVT again on 8/29 and patient continued on Xarelto. Patient admitted again for AMS. Patient did not receive Xarelto 9/7, d/t AMS. The patient has been refusing meals. Pharmacy consulted to change Xarelto to Lovenox for now.  Goal of Therapy:  Anti-Xa level 0.6-1 units/ml 4hrs after LMWH dose given Monitor platelets by anticoagulation  protocol: Yes   Plan:  Discontinue Xarelto Start Lovenox 65 mg (1 mg/kg) every 24 hours Monitor CBC, renal function, and for signs and symptoms of bleeding   Thank you for allowing [place] to participate in this patients care. Signe Colt, PharmD 12/15/2020 10:33 AM  Please check [email or web address] for unit-specific pharmacy phone numbers.

## 2020-12-16 DIAGNOSIS — G9341 Metabolic encephalopathy: Secondary | ICD-10-CM | POA: Diagnosis not present

## 2020-12-16 LAB — COMPREHENSIVE METABOLIC PANEL
ALT: 13 U/L (ref 0–44)
AST: 16 U/L (ref 15–41)
Albumin: 2.1 g/dL — ABNORMAL LOW (ref 3.5–5.0)
Alkaline Phosphatase: 39 U/L (ref 38–126)
Anion gap: 10 (ref 5–15)
BUN: 33 mg/dL — ABNORMAL HIGH (ref 8–23)
CO2: 22 mmol/L (ref 22–32)
Calcium: 8.6 mg/dL — ABNORMAL LOW (ref 8.9–10.3)
Chloride: 108 mmol/L (ref 98–111)
Creatinine, Ser: 2 mg/dL — ABNORMAL HIGH (ref 0.61–1.24)
GFR, Estimated: 32 mL/min — ABNORMAL LOW (ref >60)
Glucose, Bld: 78 mg/dL (ref 70–99)
Potassium: 3.5 mmol/L (ref 3.5–5.1)
Sodium: 140 mmol/L (ref 135–145)
Total Bilirubin: 1.1 mg/dL (ref 0.3–1.2)
Total Protein: 5.6 g/dL — ABNORMAL LOW (ref 6.5–8.1)

## 2020-12-16 LAB — CBC
HCT: 29.8 % — ABNORMAL LOW (ref 39.0–52.0)
Hemoglobin: 9.3 g/dL — ABNORMAL LOW (ref 13.0–17.0)
MCH: 25.8 pg — ABNORMAL LOW (ref 26.0–34.0)
MCHC: 31.2 g/dL (ref 30.0–36.0)
MCV: 82.8 fL (ref 80.0–100.0)
Platelets: 250 10*3/uL (ref 150–400)
RBC: 3.6 MIL/uL — ABNORMAL LOW (ref 4.22–5.81)
RDW: 18.5 % — ABNORMAL HIGH (ref 11.5–15.5)
WBC: 8.7 10*3/uL (ref 4.0–10.5)
nRBC: 0 % (ref 0.0–0.2)

## 2020-12-16 LAB — MAGNESIUM: Magnesium: 1.9 mg/dL (ref 1.7–2.4)

## 2020-12-16 MED ORDER — FLUCONAZOLE 100 MG PO TABS
200.0000 mg | ORAL_TABLET | Freq: Every day | ORAL | Status: AC
  Administered 2020-12-16 – 2020-12-29 (×14): 200 mg via ORAL
  Filled 2020-12-16 (×14): qty 2

## 2020-12-16 MED ORDER — RISAQUAD PO CAPS
1.0000 | ORAL_CAPSULE | Freq: Every day | ORAL | Status: DC
  Administered 2020-12-16 – 2020-12-28 (×13): 1 via ORAL
  Filled 2020-12-16 (×14): qty 1

## 2020-12-16 MED ORDER — FINASTERIDE 5 MG PO TABS
5.0000 mg | ORAL_TABLET | Freq: Every day | ORAL | Status: DC
  Administered 2020-12-16 – 2020-12-28 (×11): 5 mg via ORAL
  Filled 2020-12-16 (×13): qty 1

## 2020-12-16 MED ORDER — AMLODIPINE BESYLATE 5 MG PO TABS
5.0000 mg | ORAL_TABLET | Freq: Every day | ORAL | Status: DC
  Administered 2020-12-16 – 2020-12-29 (×13): 5 mg via ORAL
  Filled 2020-12-16 (×14): qty 1

## 2020-12-16 MED ORDER — TAMSULOSIN HCL 0.4 MG PO CAPS
0.4000 mg | ORAL_CAPSULE | Freq: Every day | ORAL | Status: DC
  Administered 2020-12-16 – 2020-12-28 (×13): 0.4 mg via ORAL
  Filled 2020-12-16 (×14): qty 1

## 2020-12-16 NOTE — NC FL2 (Signed)
[place] MEDICAID FL2 LEVEL OF CARE SCREENING TOOL     IDENTIFICATION  Patient Name: Corey Cummings Birthdate: May 29, 1930 Sex: male Admission Date (Current Location): 12/14/2020  [facility] and [place] Number:  Producer, television/film/video and Address:  [facility], [address], [place], [place] [address]      Provider Number: [id number]  Attending Physician Name and Address:  Lanae Boast, MD  Relative Name and Phone Number:  Step-daughter Minerva Areola - [phone]    Current Level of Care: Hospital Recommended Level of Care: Skilled Nursing Facility Prior Approval Number:    Date Approved/Denied:   PASRR Number: [id number]  Discharge Plan: SNF    Current Diagnoses: Patient Active Problem List   Diagnosis Date Noted   Acute metabolic encephalopathy 12/14/2020   Hypokalemia 12/14/2020   History of DVT (deep vein thrombosis) 12/14/2020   History of pulmonary embolus (PE) 12/14/2020   Severe sepsis (HCC) 12/04/2020   Lactic acidosis 12/04/2020   Acute cystitis without hematuria 12/04/2020   Foley catheter problem (HCC) 12/04/2020   Unilateral recurrent inguinal hernia without obstruction or gangrene 12/04/2020   Acute pyelonephritis 12/04/2020   BPH (benign prostatic hyperplasia) 12/04/2020   Chronic indwelling Foley catheter 12/04/2020   Hypertensive urgency 12/04/2020   CKD (chronic kidney disease), stage III (HCC) 12/04/2020   Acute pulmonary embolism (HCC) 02/24/2020   Ureteral obstruction, right 02/24/2020   Urinary tract infection associated with catheterization of urinary tract, initial encounter (HCC) 02/24/2020   Renal insufficiency 02/24/2020   Thoracic aortic aneurysm without rupture (HCC) 02/24/2020   Bronchiectasis (HCC) 02/24/2020   Pulmonary embolism (HCC) 02/24/2020   Hydronephrosis with urinary obstruction due to ureteral calculus     Orientation RESPIRATION BLADDER Height & Weight     Self, Time, Place  Normal Continent,  External catheter (Urethral catheter placed 9/3. Also urethral drain/stent-right ureter 6 FR. placed 8/28 with expiration date of 08/04/23.) Weight: 134 lb 11.2 oz (61.1 kg) Height:     BEHAVIORAL SYMPTOMS/MOOD NEUROLOGICAL BOWEL NUTRITION STATUS      Continent Diet (DYS 3 diet)  AMBULATORY STATUS COMMUNICATION OF NEEDS Skin   Limited Assist (Min assist per PT) Verbally Other (Comment) (Ecchymosis bilateral arm scattered)                       Personal Care Assistance Level of Assistance  Bathing, Feeding, Dressing Bathing Assistance: Maximum assistance (Upper body min assist) Feeding assistance: Independent Dressing Assistance: Maximum assistance (Upper body min assist)     Functional Limitations Info  Sight, Hearing, Speech Sight Info: Impaired Hearing Info: Adequate Speech Info: Adequate    SPECIAL CARE FACTORS FREQUENCY  PT (By licensed PT), OT (By licensed OT)     PT Frequency: Evaluated 9/8. PT at SNF eval and treat, a minimum of 5 days per week OT Frequency: Evaluated 9/8. OT at SNF eval and treat, a minimum of 5 days per week            Contractures Contractures Info: Not present    Additional Factors Info  Code Status, Allergies Code Status Info: Full Allergies Info: No known allergies           Current Medications (12/16/2020):  This is the current hospital active medication list Current Facility-Administered Medications  Medication Dose Route Frequency Provider Last Rate Last Admin   0.45 % NaCl with KCl 20 mEq / L infusion   Intravenous Continuous Madelyn Flavors A, MD 75 mL/hr at 12/16/20 0501  New Bag at 12/16/20 0501   acetaminophen (TYLENOL) tablet 650 mg  650 mg Oral Q6H PRN Madelyn Flavors A, MD       Or   acetaminophen (TYLENOL) suppository 650 mg  650 mg Rectal Q6H PRN Madelyn Flavors A, MD       acidophilus (RISAQUAD) capsule 1 capsule  1 capsule Oral Daily Kc, Ramesh, MD   1 capsule at 12/16/20 1027   amLODipine (NORVASC) tablet 5 mg  5 mg Oral  Daily Kc, Ramesh, MD   5 mg at 12/16/20 1027   ceFEPIme (MAXIPIME) 2 g in sodium chloride 0.9 % 100 mL IVPB  2 g Intravenous Q24H Kc, Ramesh, MD 200 mL/hr at 12/16/20 1037 2 g at 12/16/20 1037   Chlorhexidine Gluconate Cloth 2 % PADS 6 each  6 each Topical Daily Madelyn Flavors A, MD   6 each at 12/15/20 1300   enoxaparin (LOVENOX) injection 65 mg  1 mg/kg Subcutaneous Q24H Kc, Ramesh, MD   65 mg at 12/16/20 1027   finasteride (PROSCAR) tablet 5 mg  5 mg Oral QHS Kc, Ramesh, MD       fluconazole (DIFLUCAN) tablet 200 mg  200 mg Oral Daily Kc, Ramesh, MD   200 mg at 12/16/20 1248   hydrALAZINE (APRESOLINE) injection 5 mg  5 mg Intravenous Q6H PRN Kc, Ramesh, MD       ondansetron (ZOFRAN) tablet 4 mg  4 mg Oral Q6H PRN Madelyn Flavors A, MD       Or   ondansetron (ZOFRAN) injection 4 mg  4 mg Intravenous Q6H PRN Smith, Rondell A, MD       QUEtiapine (SEROQUEL) tablet 25 mg  25 mg Oral QHS Kc, Ramesh, MD   25 mg at 12/15/20 2204   sodium chloride flush (NS) 0.9 % injection 3 mL  3 mL Intravenous Q12H Smith, Rondell A, MD   3 mL at 12/14/20 2337   tamsulosin (FLOMAX) capsule 0.4 mg  0.4 mg Oral Daily Kc, Ramesh, MD   0.4 mg at 12/16/20 1027     Discharge Medications: Please see discharge summary for a list of discharge medications.  Relevant Imaging Results:  Relevant Lab Results:   Additional Information [id number]. Per step-daughter [name], patient has had the first COVID vaccination (a pharmacy in [place]) and she is not sure if he has received any other vaccinations.  Cristobal Goldmann, LCSW

## 2020-12-16 NOTE — Progress Notes (Signed)
PROGRESS NOTE    Corey Cummings  SWF:[id number] DOB: Dec 08, 1930 DOA: 12/14/2020 PCP: [name], MD   Chief Complaint  Patient presents with   Altered Mental Status    Pt brought in via [facility] with c/c of confusion. Per EMS pt recently admitted for sepsis and UTI, before pt was A&O x4 able to drive. Family member states that since D/C 8/31, pt has been staying with her and this morning pt has progressively gotten worse. Torn apart living room looking for something and ran outside in street. Pt arrived with foley from previous admission. Pt taken Cipro for UTI.   150/90, 90HR, 94%RA, CBG 136      Brief Narrative: 87 year old male with hypertension chronic indwelling Foley catheter, nephrolithiasis, PE on Xarelto, BPH recently hospitalized 8/8-/1931 for severe sepsis due to catheter associated urinary tract infection with hydronephrosis secondary to right UVJ calculus, had a stent placed culture grew Citrobacter friendly and discharged on complete course of ciprofloxacin.  Since discharge patient has been unsteady on his feet, was seen in the ED 9/3 after he took out his Foley catheter.  He has been acting weird confused hallucinating at times agitated, has had falls. In the ED CT brain no acute finding, afebrile, labs showed hemoglobin 8.9 g potassium 3.1 creatinine 2.3 UA large leukocyte many bacteria more than 50 RBC multiple WBC chest x-ray and pelvis x-ray no acute finding, renal CT interval placement of right double-J stent with tip coiled in the collecting system and distal ureter adjacent to the large UVJ stone which is unchanged in position. Previously seen severe right hydronephrosis has markedly improved. 2. Small locule of air remains in the patulous right collecting system which may be related to interval intervention. Persistent infection is not entirely excluded by imaging, though previously seen perinephric and periureteral stranding has significantly improved. 3. Significantly  improved left hydronephrosis with only mild residual dilation of the collecting system. 4. Unchanged horseshoe morphology"  Cultures obtained, placed on antibiotics and admitted with one-to-one, IV Ativan as needed.  Subjective: Seen and examined this morning.  Patient is much more alert awake and oriented took some time to answer questions but able to tell me the current year current location No acute events overnight.  Patient afebrile, renal function improving  Assessment & Plan:  Acute metabolic encephalopathy with agitation/hallucination, unsteady gait falls at home: CT head nonacute on admission and nonfocal.  Likely multifactorial in the setting of infection, could be due to ciprofloxacin use.  Discontinued Ativan placed on bedtime Seroquel-mental status improving, continue supportive care, fall precaution delirium precaution , continue ambulation PT OT.   Complicated catheter associated UTI/pyelonephritis BPH Chronic indwelling Foley in place Right double-J stent placed on last admission in August Large UVJ stone unchanged in position: Candi UTI-yeast in urine on last culture had Citrobacter frunedii.  Cultures were resistant to cefazolin, ceftriaxone, and nitrofurantoin.  Continue with IV cefepime for now and follow-up culture.  Will need to follow-up with urology as outpatient.  Repeat CT abdomen as above-significantly improved left hydronephrosis, markedly improved previous right severe hydronephrosis, small locule of air in patulous right collecting system may be related to interval intervention, persistent infection is not entirely excluded. Cont  Proscar finasteride . Add diflucan 200 mg daily  x 14 days.  AKI on CKD stage IIIa Baseline creatinine on last discharge 1.3.  Now improving with IV fluid hydration , continued fluid, encourage oral intake.   CK nl Recent Labs    02/27/20 0411 04/29/20 2256 12/04/20  6962 12/05/20 9528 12/06/20 0448 12/07/20 0511 12/10/20 1957  12/14/20 0655 12/15/20 0030 12/16/20 0422  BUN 23 41* 26* 31* 33* 28* 41* 39* 37* 33*  CREATININE 1.43* 2.27* 1.55* 1.48* 1.35* 1.36* 2.38* 2.32* 2.30* 2.00*    Hypomagnesemia/Hypokalemia: replaced. Cont Kcl o nivf. Recent Labs  Lab 12/10/20 1957 12/14/20 0655 12/15/20 0030 12/16/20 0422  K 3.6 3.1* 3.3* 3.5     Gait disturbance/falls at home: X-ray no acute fractures.  PT OT to cont and has recommended skilled nursing facility.    Hypertension: On amlodipine and losartan at home.  Blood pressure borderline controlled resume amlodipine.Holding ARB due to AKI.  Anemia: Stable, likely from chronic disease.  Continue to monitor. Recent Labs  Lab 12/10/20 1957 12/14/20 0655 12/15/20 0030 12/16/20 0422  HGB 10.7* 8.9* 10.0* 9.3*  HCT 35.2* 29.3* 32.5* 29.8*    History of DVT/PE on anticoagulation: Age indeterminate DVT in the left popliteal vein as well as acute PE in November/21, left leg DVT again seen on Doppler ultrasound on 12/05/2020.  While NPO switched to Lovenox subcu> resume DOAC once able to take p.o consistently.  AAA, known to have dilation of thoracic aortic arch 3.3 centimeter in November/21, outpatient follow-up and surveillance will be needed  Inguinal hernia noted on CT scan with a small loops of bowel without signs of obstruction.  Outpatient follow-up with surgery.  Monitor bowel movement  Diet Order             DIET DYS 3 Room service appropriate? Yes; Fluid consistency: Thin  Diet effective now                 Patient's Body mass index is 19.89 kg/m. DVT prophylaxis: lovenox Code Status:   Code Status: Full Code  Family Communication: plan of care discussed with patient at bedside. Status is: Inpatient  Remains inpatient appropriate because: For ongoing management of delirium AKI  Dispo: The patient is from: Home              Anticipated d/c is to: SNF in 1-2 days once renal function improves              Patient currently is not medically  stable to d/c.   Difficult to place patient No Unresulted Labs (From admission, onward)     Start     Ordered   12/16/20 0500  Comprehensive metabolic panel  Daily,   R      12/15/20 1044   12/16/20 0500  CBC  Daily,   R      12/15/20 1044            Medications reviewed:  Scheduled Meds:  Chlorhexidine Gluconate Cloth  6 each Topical Daily   enoxaparin (LOVENOX) injection  1 mg/kg Subcutaneous Q24H   QUEtiapine  25 mg Oral QHS   sodium chloride flush  3 mL Intravenous Q12H   Continuous Infusions:  0.45 % NaCl with KCl 20 mEq / L 75 mL/hr at 12/16/20 0501   ceFEPime (MAXIPIME) IV Stopped (12/15/20 1126)   Consultants:see note  Procedures:see note Antimicrobials: Anti-infectives (From admission, onward)    Start     Dose/Rate Route Frequency Ordered Stop   12/14/20 1045  ceFEPIme (MAXIPIME) 2 g in sodium chloride 0.9 % 100 mL IVPB        2 g 200 mL/hr over 30 Minutes Intravenous Every 24 hours 12/14/20 1043 12/18/20 2359   12/14/20 0800  cefTRIAXone (ROCEPHIN) 1 g in sodium chloride 0.9 %  100 mL IVPB        1 g 200 mL/hr over 30 Minutes Intravenous  Once 12/14/20 0751 12/14/20 0953      Culture/Microbiology    Component Value Date/Time   SDES URINE, CLEAN CATCH 12/14/2020 0655   SPECREQUEST  12/14/2020 0655    Normal Performed at [facility], [address], [place], [place] [address]    CULT 80,000 COLONIES/mL YEAST (A) 12/14/2020 0655   REPTSTATUS 12/15/2020 FINAL 12/14/2020 0655    Other culture-see note  Objective: Vitals: Today's Vitals   12/15/20 2219 12/15/20 2223 12/16/20 0552 12/16/20 0729  BP:  (!) 156/82 (!) 157/82   Pulse:  74 74   Resp:  16 20   Temp:  97.9 F (36.6 C) 97.9 F (36.6 C)   TempSrc:  Oral Oral   SpO2:  99% 97%   Weight: 61.1 kg     PainSc:    Asleep    Intake/Output Summary (Last 24 hours) at 12/16/2020 0748 Last data filed at 12/16/2020 0727 Gross per 24 hour  Intake 2758.52 ml  Output 2050 ml  Net 708.52 ml     Filed Weights   12/15/20 2219  Weight: 61.1 kg   Weight change:   Intake/Output from previous day: 09/08 0701 - 09/09 0700 In: 2642.3 [P.O.:220; I.V.:2322.3; IV Piggyback:100] Out: 1750 [Urine:1750] Intake/Output this shift: Total I/O In: 116.3 [I.V.:116.3] Out: 300 [Urine:300] Filed Weights   12/15/20 2219  Weight: 61.1 kg   Examination: General exam: AAOx 2-3, elderly, frail, pleasant.   HEENT:Oral mucosa moist, Ear/Nose WNL grossly, dentition normal. Respiratory system: bilaterally diminished, no use of accessory muscle Cardiovascular system: S1 & S2 +, No JVD,. Gastrointestinal system: Abdomen soft, NT,ND, BS+ Nervous System:Alert, awake, moving extremities and grossly nonfocal Extremities: no edema, distal peripheral pulses palpable.  Skin: No rashes,no icterus. MSK: Normal muscle bulk,tone, power  Foley catheter in place  Data Reviewed: I have personally reviewed following labs and imaging studies CBC: Recent Labs  Lab 12/10/20 1957 12/14/20 0655 12/15/20 0030 12/16/20 0422  WBC 14.2* 9.8 9.2 8.7  NEUTROABS 11.2* 7.4  --   --   HGB 10.7* 8.9* 10.0* 9.3*  HCT 35.2* 29.3* 32.5* 29.8*  MCV 83.2 84.2 83.3 82.8  PLT 315 246 275 250    Basic Metabolic Panel: Recent Labs  Lab 12/10/20 1957 12/14/20 0655 12/15/20 0030 12/16/20 0422  NA 137 138 141 140  K 3.6 3.1* 3.3* 3.5  CL 101 108 110 108  CO2 22 23 24 22   GLUCOSE 118* 122* 93 78  BUN 41* 39* 37* 33*  CREATININE 2.38* 2.32* 2.30* 2.00*  CALCIUM 9.1 8.4* 8.5* 8.6*  MG  --   --  1.6* 1.9    GFR: Estimated Creatinine Clearance: 22.1 mL/min (A) (by C-G formula based on SCr of 2 mg/dL (H)). Liver Function Tests: Recent Labs  Lab 12/10/20 1957 12/14/20 0655 12/16/20 0422  AST 18 17 16   ALT 13 12 13   ALKPHOS 47 39 39  BILITOT 1.1 0.6 1.1  PROT 7.4 5.6* 5.6*  ALBUMIN 3.0* 2.3* 2.1*    No results for input(s): LIPASE, AMYLASE in the last 168 hours. No results for input(s): AMMONIA in the  last 168 hours. Coagulation Profile: No results for input(s): INR, PROTIME in the last 168 hours. Cardiac Enzymes: Recent Labs  Lab 12/15/20 0030  CKTOTAL 74    BNP (last 3 results) No results for input(s): PROBNP in the last 8760 hours. HbA1C: No results  for input(s): HGBA1C in the last 72 hours. CBG: Recent Labs  Lab 12/14/20 0634  GLUCAP 114*    Lipid Profile: No results for input(s): CHOL, HDL, LDLCALC, TRIG, CHOLHDL, LDLDIRECT in the last 72 hours. Thyroid Function Tests: No results for input(s): TSH, T4TOTAL, FREET4, T3FREE, THYROIDAB in the last 72 hours. Anemia Panel: No results for input(s): VITAMINB12, FOLATE, FERRITIN, TIBC, IRON, RETICCTPCT in the last 72 hours. Sepsis Labs: No results for input(s): PROCALCITON, LATICACIDVEN in the last 168 hours.  Recent Results (from the past 240 hour(s))  Urine Culture     Status: None   Collection Time: 12/10/20  7:45 PM   Specimen: In/Out Cath Urine  Result Value Ref Range Status   Specimen Description   Final    IN/OUT CATH URINE Performed at [facility], [address], [place], [place] [address]    Special Requests   Final    NONE Performed at [facility], [address], [place], [place] [address]    Culture   Final    NO GROWTH Performed at [facility], [address] [place], [place] [address]    Report Status 12/12/2020 FINAL  Final  Urine Culture     Status: Abnormal   Collection Time: 12/14/20  6:55 AM   Specimen: Urine, Clean Catch  Result Value Ref Range Status   Specimen Description URINE, CLEAN CATCH  Final   Special Requests   Final    Normal Performed at [facility], [address], [place], [place] [address]    Culture 80,000 COLONIES/mL YEAST (A)  Final   Report Status 12/15/2020 FINAL  Final      Radiology Studies: CT Cervical Spine Wo Contrast  Result Date: 12/14/2020 CLINICAL DATA:  Neck trauma.  Altered mental status post fall.  EXAM: CT CERVICAL SPINE WITHOUT CONTRAST TECHNIQUE: Multidetector CT imaging of the cervical spine was performed without intravenous contrast. Multiplanar CT image reconstructions were also generated. COMPARISON:  Chest CTA 02/24/2020. FINDINGS: Alignment: Straightening without focal angulation or listhesis. Skull base and vertebrae: No evidence of acute fracture or traumatic subluxation. Prominent asymmetric facet arthropathy on the right at C2-3. Soft tissues and spinal canal: No prevertebral fluid or swelling. No visible canal hematoma. Disc levels: There is multilevel cervical spondylosis. At C2-3, uncinate spurring and asymmetric right-sided facet hypertrophy contribute to mild spinal stenosis and right foraminal narrowing. There is prominent posterior osteophyte formation on the right at C3-4 which may contribute to some mass effect on the cord and medial foraminal narrowing. There is chronic loss of disc height with uncinate spurring at C4-5 and C6-7. No large disc herniation identified. Upper chest: Asymmetric enlargement of the left thyroid lobe appears unchanged from previous chest CTA. No discrete nodule identified. Central airway thickening and scattered peribronchial nodularity of both lung apices, similar to previous CT. Other: None. IMPRESSION: 1. No evidence of acute cervical spine fracture or traumatic subluxation. 2. Multilevel cervical spondylosis as described. 3. Stable incidental findings including asymmetric enlargement of the left thyroid lobe, apical central airway thickening and peribronchial nodularity. Electronically Signed   By: Carey Bullocks M.D.   On: 12/14/2020 11:46   CT Renal Stone Study  Result Date: 12/14/2020 CLINICAL DATA:  Acute confusion, recent discharge from sepsis due to recurrent UTIs, history of indwelling Foley catheter, bilateral hydronephrosis EXAM: CT ABDOMEN AND PELVIS WITHOUT CONTRAST TECHNIQUE: Multidetector CT imaging of the abdomen and pelvis was performed  following the standard protocol without IV  contrast. COMPARISON:  CT abdomen/pelvis 12/04/2020 FINDINGS: Lower chest: The lung bases are clear. The imaged heart is unremarkable. Hepatobiliary: The liver and gallbladder are unremarkable. There is no biliary ductal dilatation. Pancreas: Unremarkable. Spleen: Unremarkable. Adrenals/Urinary Tract: The adrenals are unremarkable. Horseshoe kidney morphology is again seen. There has been interval placement of a right-sided double-J stent with tips coiled in the proximal collecting system and distal ureter. The previously seen right-sided hydronephrosis has markedly improved though not entirely resolved. The large calculus at the UVJ measuring 3.0 cm is unchanged in position. Marked atrophy of the right renal parenchyma with multiple additional nonobstructing renal stones are similar to the prior study. A small locule of air remains in the patulous renal pelvis (3-32). Previously seen perinephric and periureteral stranding has improved. Left hydronephrosis has significantly improved with mild residual dilation of the renal pelvis. A Foley catheter tip is in the bladder. A left bladder diverticulum is again seen. Stomach/Bowel: The stomach is unremarkable. There is no evidence of bowel obstruction. There is no definite bowel Pintor thickening or inflammatory change. There is colonic diverticulosis without evidence of acute diverticulitis. Vascular/Lymphatic: There is calcified atherosclerotic plaque throughout the nonaneurysmal abdominal aorta. There is no abdominal or pelvic lymphadenopathy. Reproductive: The prostate is markedly enlarged and impresses upon the inferior aspect of the bladder. The seminal vesicles are unremarkable. Other: There is trace fluid in the presacral space, nonspecific. There is no free intraperitoneal air. A right inguinal hernia containing a short loop of unobstructed bowel is again seen. Musculoskeletal: There is no acute osseous abnormality or  aggressive osseous lesion. Compression deformity of the L1 vertebral body is unchanged. IMPRESSION: 1. Interval placement of a right double-J stent with tips coiled in the collecting system and distal ureter adjacent to the large UVJ stone which is unchanged in position. Previously seen severe right hydronephrosis has markedly improved. 2. Small locule of air remains in the patulous right collecting system which may be related to interval intervention. Persistent infection is not entirely excluded by imaging, though previously seen perinephric and periureteral stranding has significantly improved. 3. Significantly improved left hydronephrosis with only mild residual dilation of the collecting system. 4. Unchanged horseshoe morphology of the kidneys with marked atrophy of the right moiety. Additional nonobstructing calculi in the right are overall unchanged. 5.  Aortic Atherosclerosis (ICD10-I70.0). Electronically Signed   By: Lesia HausenPeter  Noone M.D.   On: 12/14/2020 08:52     LOS: 2 days   Lanae Boastamesh Nyrah Demos, MD [facility]  12/16/2020, 7:48 AM

## 2020-12-16 NOTE — Progress Notes (Signed)
Physical Therapy Treatment Patient Details Name: Corey Cummings MRN: [id number] DOB: [birth year] Today's Date: 12/16/2020    History of Present Illness 87 y.o. male presents to [facility] ED on 12/14/2020 with AMS. Pt recently hospitalized 8/28-8/31 with sepsis 2/2 UTI. Pt admitted for acute metabolic encephalopathy and UTI management.PMH includes HTN, chronic indwelling Foley catheter, nephrolithiasis, ED/PE on Xarelto, and BPH.    PT Comments    Pt tolerates treatment well with increased tolerance for ambulation. Pt's cognition appears improved in some aspects during session, with improved recognition of deficits, however pt also demonstrates difficulty with processing how to call nurse utilizing call bell despite PT demonstration. Pt requires less physical assistance this session but does demonstrate multiple episodes of imbalance. PT continues to recommend SNF placement due to limited caregiver support in the home and continued high falls risk.   Follow Up Recommendations  SNF;Supervision for mobility/OOB     Equipment Recommendations  3in1 (PT)    Recommendations for Other Services       Precautions / Restrictions Precautions Precautions: Fall Restrictions Weight Bearing Restrictions: No    Mobility  Bed Mobility Overal bed mobility: Needs Assistance Bed Mobility: Supine to Sit     Supine to sit: Supervision          Transfers Overall transfer level: Needs assistance Equipment used: Rolling walker (2 wheeled) Transfers: Sit to/from Stand Sit to Stand: Min guard         General transfer comment: pt attempts 5 time sit to stand, completes 4 reps in 18 seconds before fatiguing  Ambulation/Gait Ambulation/Gait assistance: Min guard Gait Distance (Feet): 120 Feet Assistive device: Rolling walker (2 wheeled) Gait Pattern/deviations: Step-through pattern Gait velocity: reduced Gait velocity interpretation: 1.31 - 2.62 ft/sec, indicative of limited community ambulator General  Gait Details: pt with slowed step-through gait, 2 mild lateral losses of balance which pt corrects with UE support of walker and stepping strategy.   Stairs             Wheelchair Mobility    Modified Rankin (Stroke Patients Only)       Balance Overall balance assessment: Needs assistance Sitting-balance support: No upper extremity supported;Feet supported Sitting balance-Leahy Scale: Good     Standing balance support: Bilateral upper extremity supported Standing balance-Leahy Scale: Poor Standing balance comment: reliant on UE support of RW                            Cognition Arousal/Alertness: Awake/alert Behavior During Therapy: WFL for tasks assessed/performed Overall Cognitive Status: No family/caregiver present to determine baseline cognitive functioning                                 General Comments: pt follows commands consistently, some difficulty with cognition as pt is unable to press call button despite good UE strength and multiple PT attempts at demonstration      Exercises      General Comments General comments (skin integrity, edema, etc.): VSS on RA      Pertinent Vitals/Pain Pain Assessment: No/denies pain    Home Living                      Prior Function            PT Goals (current goals can now be found in the care plan section) Acute Rehab PT Goals Patient Stated Goal:  to improve strength and reduce falls risk Progress towards PT goals: Progressing toward goals    Frequency    Min 3X/week      PT Plan Current plan remains appropriate    Co-evaluation              AM-PAC PT "6 Clicks" Mobility   Outcome Measure  Help needed turning from your back to your side while in a flat bed without using bedrails?: A Little Help needed moving from lying on your back to sitting on the side of a flat bed without using bedrails?: A Little Help needed moving to and from a bed to a chair  (including a wheelchair)?: A Little Help needed standing up from a chair using your arms (e.g., wheelchair or bedside chair)?: A Little Help needed to walk in hospital room?: A Little Help needed climbing 3-5 steps with a railing? : A Lot 6 Click Score: 17    End of Session   Activity Tolerance: Patient tolerated treatment well Patient left: in chair;with call bell/phone within reach;with chair alarm set Nurse Communication: Mobility status PT Visit Diagnosis: Unsteadiness on feet (R26.81)     Time: 7124-5809 PT Time Calculation (min) (ACUTE ONLY): 38 min  Charges:  $Gait Training: 8-22 mins $Therapeutic Activity: 8-22 mins                     Arlyss Gandy, PT, DPT Acute Rehabilitation Pager: [phone]    Arlyss Gandy 12/16/2020, 2:23 PM

## 2020-12-16 NOTE — TOC Initial Note (Signed)
Transition of Care [facility]) - Initial/Assessment Note    Patient Details  Name: Corey Cummings MRN: [id number] Date of Birth: 1930-07-13  Transition of Care [facility]) CM/SW Contact:    Cristobal Goldmann, LCSW Phone Number: 12/16/2020, 12:55 PM  Clinical Narrative:  CSW received consult for ST rehab. Checked and patient not fully oriented. Went to room before calling daughter [name] and patient asleep, no family in room. Call made to daughter, Minerva Areola [phone]). She clarified that she is patient's step-daughter (will be here-after referred to as daughter in notes) and added that her mother died in Feb 12, 2020.  When asked, she responded that patient has no children and his wife, her mother died in Feb 12, 2020. Ms. [name] explained that patient ws doing good: he lived alone, was driving and doing good until the hospitalization before this one.  Ms. [name] informed regarding therapy's recommendation of ST rehab. When asked, daughter responded that patient has not been a facility before for ST rehab. The facility search process was explained and Medicare.gov for facility rating, inspection information. Ms. [name] informed of where SNF list would be placed.  Ms. [name] gave her preferences as [facility].  Ms. [name] reported that she lives in [place] and patient lives in [place]. She added that his sister-in-law Earnestine Leys [phone]) takes care of patient's finances. Ms. [name] was informed that she would be contacted later with facility responses.            Expected Discharge Plan: Skilled Nursing Facility Barriers to Discharge: Continued Medical Work up   Patient Goals and CMS Choice Patient states their goals for this hospitalization and ongoing recovery are:: Patient's Step-daughter Minerva Areola in agreement with ST rehab CMS Medicare.gov Compare Post Acute Care list provided to:: Patient Represenative (must comment) (Medicare.gov SNF list placed in room for  daughter) Choice offered to / list presented to : Adult Children (Step-aughter Debbie)  Expected Discharge Plan and Services Expected Discharge Plan: Skilled Nursing Facility In-house Referral: Clinical Social Work   Post Acute Care Choice: Skilled Nursing Facility Living arrangements for the past 2 months: Single Family Home (Patient has his own home, but was staying with step- daughter [name] before being admitted to hospital)                                      Prior Living Arrangements/Services Living arrangements for the past 2 months: Single Family Home (Patient has his own home, but was staying with step- daughter [name] before being admitted to hospital) Lives with:: Self, Adult Children (Was staying with Minerva Areola before coming to hospital) Patient language and need for interpreter reviewed:: No Do you feel safe going back to the place where you live?: No   Daughter [name] in agreement with ST rehab  Need for Family Participation in Patient Care: Yes (Comment) Care giver support system in place?: No (comment)   Criminal Activity/Legal Involvement Pertinent to Current Situation/Hospitalization: No - Comment as needed  Activities of Daily Living      Permission Sought/Granted Permission sought to share information with : Family Supports Permission granted to share information with : No (Patient not oriented to situation)              Emotional Assessment Appearance:: Appears stated age Attitude/Demeanor/Rapport: Unable to Assess (Looked into room and patient was asleep) Affect (typically observed): Unable to Assess Orientation: : Oriented to  Self, Oriented to Place, Oriented to  Time   Psych Involvement: No (comment)  Admission diagnosis:  Fall [W19.XXXA] Acute metabolic encephalopathy [G93.41] AMS (altered mental status) [R41.82] Patient Active Problem List   Diagnosis Date Noted   Acute metabolic encephalopathy 12/14/2020   Hypokalemia 12/14/2020    History of DVT (deep vein thrombosis) 12/14/2020   History of pulmonary embolus (PE) 12/14/2020   Severe sepsis (HCC) 12/04/2020   Lactic acidosis 12/04/2020   Acute cystitis without hematuria 12/04/2020   Foley catheter problem (HCC) 12/04/2020   Unilateral recurrent inguinal hernia without obstruction or gangrene 12/04/2020   Acute pyelonephritis 12/04/2020   BPH (benign prostatic hyperplasia) 12/04/2020   Chronic indwelling Foley catheter 12/04/2020   Hypertensive urgency 12/04/2020   CKD (chronic kidney disease), stage III (HCC) 12/04/2020   Acute pulmonary embolism (HCC) 02/24/2020   Ureteral obstruction, right 02/24/2020   Urinary tract infection associated with catheterization of urinary tract, initial encounter (HCC) 02/24/2020   Renal insufficiency 02/24/2020   Thoracic aortic aneurysm without rupture (HCC) 02/24/2020   Bronchiectasis (HCC) 02/24/2020   Pulmonary embolism (HCC) 02/24/2020   Hydronephrosis with urinary obstruction due to ureteral calculus    PCP:  [name], MD Pharmacy:   [phone] [place] [place] - [address] [place] [address] Phone: [phone] Fax: [phone]  [facility] [address] [place] [address] Phone: [phone] Fax: [phone]     Social Determinants of Health (SDOH) Interventions  No SDOH interventions requested or needed at this time.  Readmission Risk Interventions No flowsheet data found.

## 2020-12-17 DIAGNOSIS — G9341 Metabolic encephalopathy: Secondary | ICD-10-CM | POA: Diagnosis not present

## 2020-12-17 LAB — BASIC METABOLIC PANEL
Anion gap: 8 (ref 5–15)
BUN: 28 mg/dL — ABNORMAL HIGH (ref 8–23)
CO2: 21 mmol/L — ABNORMAL LOW (ref 22–32)
Calcium: 8.3 mg/dL — ABNORMAL LOW (ref 8.9–10.3)
Chloride: 109 mmol/L (ref 98–111)
Creatinine, Ser: 1.69 mg/dL — ABNORMAL HIGH (ref 0.61–1.24)
GFR, Estimated: 39 mL/min — ABNORMAL LOW (ref >60)
Glucose, Bld: 83 mg/dL (ref 70–99)
Potassium: 3.5 mmol/L (ref 3.5–5.1)
Sodium: 138 mmol/L (ref 135–145)

## 2020-12-17 MED ORDER — ENOXAPARIN SODIUM 60 MG/0.6ML IJ SOSY
60.0000 mg | PREFILLED_SYRINGE | INTRAMUSCULAR | Status: DC
  Administered 2020-12-17: 60 mg via SUBCUTANEOUS
  Filled 2020-12-17: qty 0.6

## 2020-12-17 NOTE — Plan of Care (Signed)
  Problem: Education: Goal: Knowledge of General Education information will improve Description: Including pain rating scale, medication(s)/side effects and non-pharmacologic comfort measures Outcome: Progressing   Problem: Health Behavior/Discharge Planning: Goal: Ability to manage health-related needs will improve Outcome: Progressing   Problem: Clinical Measurements: Goal: Ability to maintain clinical measurements within normal limits will improve Outcome: Progressing   Problem: Activity: Goal: Risk for activity intolerance will decrease Outcome: Progressing   Problem: Elimination: Goal: Will not experience complications related to bowel motility Outcome: Progressing   Problem: Safety: Goal: Ability to remain free from injury will improve Outcome: Progressing   

## 2020-12-17 NOTE — Progress Notes (Signed)
PROGRESS NOTE    Corey Cummings  WUJ:[id number] DOB: 05-13-1930 DOA: 12/14/2020 PCP: [name], MD   Brief narrative 87 year old male with hypertension chronic indwelling Foley catheter, nephrolithiasis, PE on Xarelto, BPH recently hospitalized 8/8-/1931 for severe sepsis due to catheter associated urinary tract infection with hydronephrosis secondary to right UVJ calculus, had a stent placed culture grew Citrobacter friendly and discharged on complete course of ciprofloxacin.  Since discharge patient has been unsteady on his feet, was seen in the ED 9/3 after he took out his Foley catheter.  He has been acting weird confused hallucinating at times agitated, has had falls. In the ED CT brain no acute finding, afebrile, labs showed hemoglobin 8.9 g potassium 3.1 creatinine 2.3 UA large leukocyte many bacteria more than 50 RBC multiple WBC chest x-ray and pelvis x-ray no acute finding, renal CT interval placement of right double-J stent with tip coiled in the collecting system and distal ureter adjacent to the large UVJ stone which is unchanged in position. Previously seen severe right hydronephrosis has markedly improved. 2. Small locule of air remains in the patulous right collecting system which may be related to interval intervention. Persistent infection is not entirely excluded by imaging, though previously seen perinephric and periureteral stranding has significantly improved. 3. Significantly improved left hydronephrosis with only mild residual dilation of the collecting system. 4. Unchanged horseshoe morphology"  Cultures obtained, placed on antibiotics and admitted with one-to-one, IV Ativan as needed. Patient is managed IV fluids, IV antibiotics supportive care and mental status improved.  Subjective: Seen and examined this morning.  He is much more alert awake oriented close to baseline.  Drinking enough.  Assessment & Plan:  Acute metabolic encephalopathy with agitation/hallucination,  unsteady gait falls at home: CT head nonacute on admission and nonfocal.  Likely multifactorial in the setting of infection, could be due to ciprofloxacin use.  Discontinued Ativan placed on bedtime Seroquel-mental status stable and improved at baseline.  Continue supportive care for compression.  Waiting for placement.    Complicated catheter associated UTI/pyelonephritis BPH Chronic indwelling Foley in place Right double-J stent placed on last admission in August Large UVJ stone unchanged in position: Candi UTI-yeast in urine on last culture had Citrobacter frunedii.  Cultures were resistant to cefazolin, ceftriaxone, and nitrofurantoin.  Urine culture with yeast, added Diflucan, can stop IV antibiotics after 1 more day. Will need to follow-up with urology as outpatient.  Repeat CT abdomen as above-significantly improved left hydronephrosis, markedly improved previous right severe hydronephrosis, small locule of air in patulous right collecting system may be related to interval intervention, persistent infection is not entirely excluded. Cont  Proscar finasteride .  Continue diflucan 200 mg daily  x 14 days.  AKI on CKD stage IIIa Baseline creatinine on last discharge 1.3.  Nicely improved with IV fluid hydration -continue for overnight, encourage oral hydration. Recent Labs    04/29/20 2256 12/04/20 0921 12/05/20 0432 12/06/20 0448 12/07/20 0511 12/10/20 1957 12/14/20 0655 12/15/20 0030 12/16/20 0422 12/17/20 0431  BUN 41* 26* 31* 33* 28* 41* 39* 37* 33* 28*  CREATININE 2.27* 1.55* 1.48* 1.35* 1.36* 2.38* 2.32* 2.30* 2.00* 1.69*    Hypomagnesemia/Hypokalemia: Resolved Recent Labs  Lab 12/10/20 1957 12/14/20 0655 12/15/20 0030 12/16/20 0422 12/17/20 0431  K 3.6 3.1* 3.3* 3.5 3.5     Gait disturbance/falls at home: X-ray no acute fractures.  PT OT to cont and has recommended skilled nursing facility.    Hypertension: On amlodipine and losartan at home.  Blood pressure  borderline controlled continue amlodipine.Holding ARB due to AKI.  Anemia: Stable, likely from chronic disease.  Continue to monitor. Recent Labs  Lab 12/10/20 1957 12/14/20 0655 12/15/20 0030 12/16/20 0422  HGB 10.7* 8.9* 10.0* 9.3*  HCT 35.2* 29.3* 32.5* 29.8*    History of DVT/PE on anticoagulation: Age indeterminate DVT in the left popliteal vein as well as acute PE in November/21, left leg DVT again seen on Doppler ultrasound on 12/05/2020.  Cont his DOAC  AAA, known to have dilation of thoracic aortic arch 3.3 centimeter in November/21, outpatient follow-up and surveillance will be needed  Inguinal hernia noted on CT scan with a small loops of bowel without signs of obstruction.  Outpatient follow-up with surgery.  Monitor bowel movement  Diet Order             DIET DYS 3 Room service appropriate? Yes; Fluid consistency: Thin  Diet effective now                 Patient's Body mass index is 19.89 kg/m. DVT prophylaxis: lovenox Code Status:   Code Status: Full Code  Family Communication: plan of care discussed with patient at bedside. Status is: Inpatient  Remains inpatient appropriate because: For ongoing management of delirium AKI  Dispo: The patient is from: Home              Anticipated d/c is to: SNF once bed available as early as tomorrow.              Patient currently is not medically stable to d/c.   Difficult to place patient No Unresulted Labs (From admission, onward)     Start     Ordered   12/17/20 0500  Basic metabolic panel  Daily,   R     Question:  Specimen collection method  Answer:  Lab=Lab collect   12/16/20 0753            Medications reviewed:  Scheduled Meds:  acidophilus  1 capsule Oral Daily   amLODipine  5 mg Oral Daily   Chlorhexidine Gluconate Cloth  6 each Topical Daily   enoxaparin (LOVENOX) injection  60 mg Subcutaneous Q24H   finasteride  5 mg Oral QHS   fluconazole  200 mg Oral Daily   QUEtiapine  25 mg Oral QHS    sodium chloride flush  3 mL Intravenous Q12H   tamsulosin  0.4 mg Oral Daily   Continuous Infusions:  0.45 % NaCl with KCl 20 mEq / L 75 mL/hr at 12/17/20 0630   ceFEPime (MAXIPIME) IV 2 g (12/17/20 1007)   Consultants:see note  Procedures:see note Antimicrobials: Anti-infectives (From admission, onward)    Start     Dose/Rate Route Frequency Ordered Stop   12/16/20 1130  fluconazole (DIFLUCAN) tablet 200 mg        200 mg Oral Daily 12/16/20 1041 12/30/20 0959   12/14/20 1045  ceFEPIme (MAXIPIME) 2 g in sodium chloride 0.9 % 100 mL IVPB        2 g 200 mL/hr over 30 Minutes Intravenous Every 24 hours 12/14/20 1043 12/18/20 2359   12/14/20 0800  cefTRIAXone (ROCEPHIN) 1 g in sodium chloride 0.9 % 100 mL IVPB        1 g 200 mL/hr over 30 Minutes Intravenous  Once 12/14/20 0751 12/14/20 0953      Culture/Microbiology    Component Value Date/Time   SDES URINE, CLEAN CATCH 12/14/2020 0655   SPECREQUEST  12/14/2020 0655    Normal  Performed at [facility], [address] [place], [place] [address]    CULT 80,000 COLONIES/mL YEAST (A) 12/14/2020 0655   REPTSTATUS 12/15/2020 FINAL 12/14/2020 0655    Other culture-see note  Objective: Vitals: Today's Vitals   12/16/20 2111 12/17/20 0443 12/17/20 0639 12/17/20 0926  BP: (!) 149/75 (!) 161/83 (!) 142/82 (!) 115/49  Pulse: 70 80 71 72  Resp: 19 18 16 18   Temp: 98 F (36.7 C) (!) 97.5 F (36.4 C) 97.7 F (36.5 C) 98.1 F (36.7 C)  TempSrc: Oral Oral Oral   SpO2: 98% 98% 98% 98%  Weight:      PainSc:        Intake/Output Summary (Last 24 hours) at 12/17/2020 1056 Last data filed at 12/17/2020 0800 Gross per 24 hour  Intake 1821.91 ml  Output 1350 ml  Net 471.91 ml    Filed Weights   12/15/20 2219  Weight: 61.1 kg   Weight change:   Intake/Output from previous day: 09/09 0701 - 09/10 0700 In: 1738.2 [P.O.:440; I.V.:1198.2; IV Piggyback:100] Out: 1650 [Urine:1650] Intake/Output this shift: Total I/O In:  200 [P.O.:200] Out: -  Filed Weights   12/15/20 2219  Weight: 61.1 kg   Examination: General exam: AAOx 3, elderly, pleasant, not in distress.  HEENT:Oral mucosa moist, Ear/Nose WNL grossly, dentition normal. Respiratory system: bilaterally clear breath sounds, no use of accessory muscle Cardiovascular system: S1 & S2 +, No JVD,. Gastrointestinal system: Abdomen soft,NT,ND, BS+ Nervous System:Alert, awake, moving extremities and grossly nonfocal Extremities: no edema, distal peripheral pulses palpable.  Skin: No rashes,no icterus. MSK: Normal muscle bulk,tone, power  Foley catheter in place.  Data Reviewed: I have personally reviewed following labs and imaging studies CBC: Recent Labs  Lab 12/10/20 1957 12/14/20 0655 12/15/20 0030 12/16/20 0422  WBC 14.2* 9.8 9.2 8.7  NEUTROABS 11.2* 7.4  --   --   HGB 10.7* 8.9* 10.0* 9.3*  HCT 35.2* 29.3* 32.5* 29.8*  MCV 83.2 84.2 83.3 82.8  PLT 315 246 275 250    Basic Metabolic Panel: Recent Labs  Lab 12/10/20 1957 12/14/20 0655 12/15/20 0030 12/16/20 0422 12/17/20 0431  NA 137 138 141 140 138  K 3.6 3.1* 3.3* 3.5 3.5  CL 101 108 110 108 109  CO2 22 23 24 22  21*  GLUCOSE 118* 122* 93 78 83  BUN 41* 39* 37* 33* 28*  CREATININE 2.38* 2.32* 2.30* 2.00* 1.69*  CALCIUM 9.1 8.4* 8.5* 8.6* 8.3*  MG  --   --  1.6* 1.9  --     GFR: Estimated Creatinine Clearance: 26.1 mL/min (A) (by C-G formula based on SCr of 1.69 mg/dL (H)). Liver Function Tests: Recent Labs  Lab 12/10/20 1957 12/14/20 0655 12/16/20 0422  AST 18 17 16   ALT 13 12 13   ALKPHOS 47 39 39  BILITOT 1.1 0.6 1.1  PROT 7.4 5.6* 5.6*  ALBUMIN 3.0* 2.3* 2.1*    No results for input(s): LIPASE, AMYLASE in the last 168 hours. No results for input(s): AMMONIA in the last 168 hours. Coagulation Profile: No results for input(s): INR, PROTIME in the last 168 hours. Cardiac Enzymes: Recent Labs  Lab 12/15/20 0030  CKTOTAL 74    BNP (last 3 results) No  results for input(s): PROBNP in the last 8760 hours. HbA1C: No results for input(s): HGBA1C in the last 72 hours. CBG: Recent Labs  Lab 12/14/20 0634  GLUCAP 114*    Lipid Profile: No results for input(s): CHOL, HDL, LDLCALC, TRIG,  CHOLHDL, LDLDIRECT in the last 72 hours. Thyroid Function Tests: No results for input(s): TSH, T4TOTAL, FREET4, T3FREE, THYROIDAB in the last 72 hours. Anemia Panel: No results for input(s): VITAMINB12, FOLATE, FERRITIN, TIBC, IRON, RETICCTPCT in the last 72 hours. Sepsis Labs: No results for input(s): PROCALCITON, LATICACIDVEN in the last 168 hours.  Recent Results (from the past 240 hour(s))  Urine Culture     Status: None   Collection Time: 12/10/20  7:45 PM   Specimen: In/Out Cath Urine  Result Value Ref Range Status   Specimen Description   Final    IN/OUT CATH URINE Performed at [facility], [address] [place], [place] [address]    Special Requests   Final    NONE Performed at [facility], [address] [place], [place] [address]    Culture   Final    NO GROWTH Performed at [facility], [address] [place], [place] [address]    Report Status 12/12/2020 FINAL  Final  Urine Culture     Status: Abnormal   Collection Time: 12/14/20  6:55 AM   Specimen: Urine, Clean Catch  Result Value Ref Range Status   Specimen Description URINE, CLEAN CATCH  Final   Special Requests   Final    Normal Performed at [facility], [address] [place], [place] [address]    Culture 80,000 COLONIES/mL YEAST (A)  Final   Report Status 12/15/2020 FINAL  Final      Radiology Studies: No results found.   LOS: 3 days   Lanae Boastamesh Terri Malerba, MD [facility]  12/17/2020, 10:56 AM

## 2020-12-18 DIAGNOSIS — G9341 Metabolic encephalopathy: Secondary | ICD-10-CM | POA: Diagnosis not present

## 2020-12-18 LAB — BASIC METABOLIC PANEL
Anion gap: 8 (ref 5–15)
BUN: 22 mg/dL (ref 8–23)
CO2: 21 mmol/L — ABNORMAL LOW (ref 22–32)
Calcium: 8.1 mg/dL — ABNORMAL LOW (ref 8.9–10.3)
Chloride: 108 mmol/L (ref 98–111)
Creatinine, Ser: 1.42 mg/dL — ABNORMAL HIGH (ref 0.61–1.24)
GFR, Estimated: 48 mL/min — ABNORMAL LOW (ref >60)
Glucose, Bld: 94 mg/dL (ref 70–99)
Potassium: 3.8 mmol/L (ref 3.5–5.1)
Sodium: 137 mmol/L (ref 135–145)

## 2020-12-18 MED ORDER — ENOXAPARIN SODIUM 60 MG/0.6ML IJ SOSY
60.0000 mg | PREFILLED_SYRINGE | Freq: Two times a day (BID) | INTRAMUSCULAR | Status: DC
  Administered 2020-12-18 – 2020-12-21 (×4): 60 mg via SUBCUTANEOUS
  Filled 2020-12-18 (×7): qty 0.6

## 2020-12-18 NOTE — Plan of Care (Signed)

## 2020-12-18 NOTE — Progress Notes (Signed)
PROGRESS NOTE    Corey Cummings  MCN:[id number] DOB: Jul 19, 1930 DOA: 12/14/2020 PCP: [name], MD   Brief narrative 87 year old male with hypertension chronic indwelling Foley catheter, nephrolithiasis, PE on Xarelto, BPH recently hospitalized 8/8-/1931 for severe sepsis due to catheter associated urinary tract infection with hydronephrosis secondary to right UVJ calculus, had a stent placed culture grew Citrobacter friendly and discharged on complete course of ciprofloxacin.  Since discharge patient has been unsteady on his feet, was seen in the ED 9/3 after he took out his Foley catheter.  He has been acting weird confused hallucinating at times agitated, has had falls. In the ED CT brain no acute finding, afebrile, labs showed hemoglobin 8.9 g potassium 3.1 creatinine 2.3 UA large leukocyte many bacteria more than 50 RBC multiple WBC chest x-ray and pelvis x-ray no acute finding, renal CT interval placement of right double-J stent with tip coiled in the collecting system and distal ureter adjacent to the large UVJ stone which is unchanged in position. Previously seen severe right hydronephrosis has markedly improved. 2. Small locule of air remains in the patulous right collecting system which may be related to interval intervention. Persistent infection is not entirely excluded by imaging, though previously seen perinephric and periureteral stranding has significantly improved. 3. Significantly improved left hydronephrosis with only mild residual dilation of the collecting system. 4. Unchanged horseshoe morphology"  Cultures obtained, placed on antibiotics and admitted with one-to-one, IV Ativan as needed. Patient is managed IV fluids, IV antibiotics supportive care and mental status improved. Creatinine has nicely improved tolerating p.o., remains weak and deconditioned and is planning for PT OT and skilled nursing therapy upon discharge  Subjective: Seen and examined, no new complaints resting  comfortably, reports is doing well on Seroquel  Assessment & Plan:  Acute metabolic encephalopathy with agitation/hallucination, unsteady gait falls at home: CT head nonacute on admission and nonfocal.  Likely multifactorial in the setting of infection, could be due to ciprofloxacin use.  Mental status nicely improved, doing well with bedtime Seroquel and would like to just stay on it.  Continue supportive care PT OT and SNF placement  Complicated catheter associated UTI/pyelonephritis BPH Chronic indwelling Foley in place Right double-J stent placed on last admission in August Large UVJ stone unchanged in position: Candi UTI-yeast in urine on last culture had Citrobacter frunedii.  Cultures were resistant to cefazolin, ceftriaxone, and nitrofurantoin.  Urine culture with yeast, added Diflucan-we will stop IV cefepime after today's dose.Will need to follow-up with urology as outpatient.  Repeat CT abdomen as above-significantly improved left hydronephrosis, markedly improved previous right severe hydronephrosis, small locule of air in patulous right collecting system may be related to interval intervention, persistent infection is not entirely excluded. Cont  Proscar finasteride .  Continue diflucan 200 mg daily  x 14 days.  AKI on CKD stage IIIa Baseline creatinine on last discharge 1.3.  Nicely improved close to baseline.  Stop IV fluids and encourage oral intake.   Recent Labs    12/04/20 0921 12/05/20 0432 12/06/20 0448 12/07/20 0511 12/10/20 1957 12/14/20 0655 12/15/20 0030 12/16/20 0422 12/17/20 0431 12/18/20 0434  BUN 26* 31* 33* 28* 41* 39* 37* 33* 28* 22  CREATININE 1.55* 1.48* 1.35* 1.36* 2.38* 2.32* 2.30* 2.00* 1.69* 1.42*    Hypomagnesemia/Hypokalemia: Resolved Recent Labs  Lab 12/14/20 0655 12/15/20 0030 12/16/20 0422 12/17/20 0431 12/18/20 0434  K 3.1* 3.3* 3.5 3.5 3.8     Gait disturbance/falls at home: X-ray no acute fractures.  PT OT to  cont and has  recommended skilled nursing facility.    Hypertension: On amlodipine and losartan at home.  Blood pressure borderline controlled continue amlodipine.Holding ARB due to AKI-consider resuming if blood pressure is uncontrolled.  Anemia: Stable, likely from chronic disease.  Continue to monitor. Recent Labs  Lab 12/14/20 0655 12/15/20 0030 12/16/20 0422  HGB 8.9* 10.0* 9.3*  HCT 29.3* 32.5* 29.8*    History of DVT/PE on anticoagulation: Age indeterminate DVT in the left popliteal vein as well as acute PE in November/21, left leg DVT again seen on Doppler ultrasound on 12/05/2020.  Cont his DOAC  AAA, known to have dilation of thoracic aortic arch 3.3 centimeter in November/21, outpatient follow-up and surveillance will be needed  Inguinal hernia noted on CT scan with a small loops of bowel without signs of obstruction.  Outpatient follow-up with surgery.  Monitor bowel movement  Diet Order             DIET DYS 3 Room service appropriate? Yes; Fluid consistency: Thin  Diet effective now                 Patient's Body mass index is 19.89 kg/m. DVT prophylaxis: lovenox Code Status:   Code Status: Full Code  Family Communication: plan of care discussed with patient at bedside. Status is: Inpatient  Remains inpatient appropriate because: For ongoing management of delirium AKI  Dispo: The patient is from: Home              Anticipated d/c is to: SNF.              Patient currently is medically stable for discharge   Difficult to place patient No Unresulted Labs (From admission, onward)     Start     Ordered   12/17/20 0500  Basic metabolic panel  Daily,   R     Question:  Specimen collection method  Answer:  Lab=Lab collect   12/16/20 0753            Medications reviewed:  Scheduled Meds:  acidophilus  1 capsule Oral Daily   amLODipine  5 mg Oral Daily   Chlorhexidine Gluconate Cloth  6 each Topical Daily   enoxaparin (LOVENOX) injection  60 mg Subcutaneous Q12H    finasteride  5 mg Oral QHS   fluconazole  200 mg Oral Daily   QUEtiapine  25 mg Oral QHS   sodium chloride flush  3 mL Intravenous Q12H   tamsulosin  0.4 mg Oral Daily   Continuous Infusions:  ceFEPime (MAXIPIME) IV 2 g (12/17/20 1007)   Consultants:see note  Procedures:see note Antimicrobials: Anti-infectives (From admission, onward)    Start     Dose/Rate Route Frequency Ordered Stop   12/16/20 1130  fluconazole (DIFLUCAN) tablet 200 mg        200 mg Oral Daily 12/16/20 1041 12/30/20 0959   12/14/20 1045  ceFEPIme (MAXIPIME) 2 g in sodium chloride 0.9 % 100 mL IVPB        2 g 200 mL/hr over 30 Minutes Intravenous Every 24 hours 12/14/20 1043 12/18/20 2359   12/14/20 0800  cefTRIAXone (ROCEPHIN) 1 g in sodium chloride 0.9 % 100 mL IVPB        1 g 200 mL/hr over 30 Minutes Intravenous  Once 12/14/20 0751 12/14/20 0953      Culture/Microbiology    Component Value Date/Time   SDES URINE, CLEAN CATCH 12/14/2020 0655   SPECREQUEST  12/14/2020 0655    Normal Performed  at [facility], [address], [place], [place] [address]    CULT 80,000 COLONIES/mL YEAST (A) 12/14/2020 0655   REPTSTATUS 12/15/2020 FINAL 12/14/2020 0655    Other culture-see note  Objective: Vitals: Today's Vitals   12/17/20 2022 12/17/20 2100 12/18/20 0510 12/18/20 0940  BP: (!) 149/81  133/77 136/82  Pulse: 76  75 85  Resp: 17  14 18   Temp: 97.9 F (36.6 C)  97.9 F (36.6 C) 98.2 F (36.8 C)  TempSrc: Oral  Oral Oral  SpO2: 99%  99% 97%  Weight:      PainSc:  0-No pain      Intake/Output Summary (Last 24 hours) at 12/18/2020 1011 Last data filed at 12/18/2020 0506 Gross per 24 hour  Intake 540 ml  Output 2750 ml  Net -2210 ml    Filed Weights   12/15/20 2219  Weight: 61.1 kg   Weight change:   Intake/Output from previous day: 09/10 0701 - 09/11 0700 In: 740 [P.O.:740] Out: 2750 [Urine:2750] Intake/Output this shift: No intake/output data recorded. Filed Weights    12/15/20 2219  Weight: 61.1 kg   Examination: General exam: AAOx 3,older than stated age, weak appearing. HEENT:Oral mucosa moist, Ear/Nose WNL grossly, dentition normal. Respiratory system: bilaterally diminished,  no use of accessory muscle Cardiovascular system: S1 & S2 +, No JVD,. Gastrointestinal system: Abdomen soft, NT,ND, BS+ Nervous System:Alert, awake, moving extremities and grossly nonfocal Extremities: no edema, distal peripheral pulses palpable.  Skin: No rashes,no icterus. MSK: Normal muscle bulk,tone, power  Foley+  Data Reviewed: I have personally reviewed following labs and imaging studies CBC: Recent Labs  Lab 12/14/20 0655 12/15/20 0030 12/16/20 0422  WBC 9.8 9.2 8.7  NEUTROABS 7.4  --   --   HGB 8.9* 10.0* 9.3*  HCT 29.3* 32.5* 29.8*  MCV 84.2 83.3 82.8  PLT 246 275 250    Basic Metabolic Panel: Recent Labs  Lab 12/14/20 0655 12/15/20 0030 12/16/20 0422 12/17/20 0431 12/18/20 0434  NA 138 141 140 138 137  K 3.1* 3.3* 3.5 3.5 3.8  CL 108 110 108 109 108  CO2 23 24 22  21* 21*  GLUCOSE 122* 93 78 83 94  BUN 39* 37* 33* 28* 22  CREATININE 2.32* 2.30* 2.00* 1.69* 1.42*  CALCIUM 8.4* 8.5* 8.6* 8.3* 8.1*  MG  --  1.6* 1.9  --   --     GFR: Estimated Creatinine Clearance: 31.1 mL/min (A) (by C-G formula based on SCr of 1.42 mg/dL (H)). Liver Function Tests: Recent Labs  Lab 12/14/20 0655 12/16/20 0422  AST 17 16  ALT 12 13  ALKPHOS 39 39  BILITOT 0.6 1.1  PROT 5.6* 5.6*  ALBUMIN 2.3* 2.1*    No results for input(s): LIPASE, AMYLASE in the last 168 hours. No results for input(s): AMMONIA in the last 168 hours. Coagulation Profile: No results for input(s): INR, PROTIME in the last 168 hours. Cardiac Enzymes: Recent Labs  Lab 12/15/20 0030  CKTOTAL 74    BNP (last 3 results) No results for input(s): PROBNP in the last 8760 hours. HbA1C: No results for input(s): HGBA1C in the last 72 hours. CBG: Recent Labs  Lab 12/14/20 0634   GLUCAP 114*    Lipid Profile: No results for input(s): CHOL, HDL, LDLCALC, TRIG, CHOLHDL, LDLDIRECT in the last 72 hours. Thyroid Function Tests: No results for input(s): TSH, T4TOTAL, FREET4, T3FREE, THYROIDAB in the last 72 hours. Anemia Panel: No results for input(s): VITAMINB12, FOLATE, FERRITIN, TIBC,  IRON, RETICCTPCT in the last 72 hours. Sepsis Labs: No results for input(s): PROCALCITON, LATICACIDVEN in the last 168 hours.  Recent Results (from the past 240 hour(s))  Urine Culture     Status: None   Collection Time: 12/10/20  7:45 PM   Specimen: In/Out Cath Urine  Result Value Ref Range Status   Specimen Description   Final    IN/OUT CATH URINE Performed at [facility], [address] [place], [place] [address]    Special Requests   Final    NONE Performed at [facility], [address] [place], [place] [address]    Culture   Final    NO GROWTH Performed at [facility], [address] [place], [place] [address]    Report Status 12/12/2020 FINAL  Final  Urine Culture     Status: Abnormal   Collection Time: 12/14/20  6:55 AM   Specimen: Urine, Clean Catch  Result Value Ref Range Status   Specimen Description URINE, CLEAN CATCH  Final   Special Requests   Final    Normal Performed at [facility], [address] [place], [place] [address]    Culture 80,000 COLONIES/mL YEAST (A)  Final   Report Status 12/15/2020 FINAL  Final      Radiology Studies: No results found.   LOS: 4 days   Lanae Boastamesh Ramonte Mena, MD [facility]  12/18/2020, 10:11 AM

## 2020-12-19 DIAGNOSIS — G9341 Metabolic encephalopathy: Secondary | ICD-10-CM | POA: Diagnosis not present

## 2020-12-19 LAB — CBC
HCT: 30.8 % — ABNORMAL LOW (ref 39.0–52.0)
Hemoglobin: 9.5 g/dL — ABNORMAL LOW (ref 13.0–17.0)
MCH: 25.7 pg — ABNORMAL LOW (ref 26.0–34.0)
MCHC: 30.8 g/dL (ref 30.0–36.0)
MCV: 83.2 fL (ref 80.0–100.0)
Platelets: 223 10*3/uL (ref 150–400)
RBC: 3.7 MIL/uL — ABNORMAL LOW (ref 4.22–5.81)
RDW: 17.9 % — ABNORMAL HIGH (ref 11.5–15.5)
WBC: 10.4 10*3/uL (ref 4.0–10.5)
nRBC: 0 % (ref 0.0–0.2)

## 2020-12-19 LAB — BASIC METABOLIC PANEL
Anion gap: 7 (ref 5–15)
BUN: 23 mg/dL (ref 8–23)
CO2: 25 mmol/L (ref 22–32)
Calcium: 8.4 mg/dL — ABNORMAL LOW (ref 8.9–10.3)
Chloride: 104 mmol/L (ref 98–111)
Creatinine, Ser: 1.55 mg/dL — ABNORMAL HIGH (ref 0.61–1.24)
GFR, Estimated: 43 mL/min — ABNORMAL LOW (ref >60)
Glucose, Bld: 140 mg/dL — ABNORMAL HIGH (ref 70–99)
Potassium: 4 mmol/L (ref 3.5–5.1)
Sodium: 136 mmol/L (ref 135–145)

## 2020-12-19 MED ORDER — FLUCONAZOLE 200 MG PO TABS
200.0000 mg | ORAL_TABLET | Freq: Every day | ORAL | 0 refills | Status: DC
Start: 2020-12-20 — End: 2020-12-29

## 2020-12-19 MED ORDER — QUETIAPINE FUMARATE 25 MG PO TABS: 12.5000 mg | ORAL_TABLET | Freq: Every day | ORAL | Status: DC

## 2020-12-19 NOTE — Discharge Summary (Deleted)
Physician Discharge Summary  Niel Peretti XFG:[id number] DOB: 11-Aug-1930 DOA: 12/14/2020  PCP: [name], MD  Admit date: 12/14/2020 Discharge date: 12/19/2020  Admitted From: home Disposition:  SNF  Recommendations for Outpatient Follow-up:  Follow up with PCP in 1-2 weeks Follow-up with urology in 1 week Please obtain BMP/CBC in one week   Home Health:NO  Equipment/Devices: NONE  Discharge Condition:Stable Code Status:   Code Status: Full Code Diet recommendation:  Diet Order             DIET DYS 3 Room service appropriate? Yes; Fluid consistency: Thin  Diet effective now                 Brief/Interim Summary: 87 year old male with hypertension chronic indwelling Foley catheter, nephrolithiasis, PE on Xarelto, BPH recently hospitalized 8/8-/1931 for severe sepsis due to catheter associated urinary tract infection with hydronephrosis secondary to right UVJ calculus, had a stent placed culture grew Citrobacter friendly and discharged on complete course of ciprofloxacin.  Since discharge patient has been unsteady on his feet, was seen in the ED 9/3 after he took out his Foley catheter.  He has been acting weird confused hallucinating at times agitated, has had falls. In the ED CT brain no acute finding, afebrile, labs showed hemoglobin 8.9 g potassium 3.1 creatinine 2.3 UA large leukocyte many bacteria more than 50 RBC multiple WBC chest x-ray and pelvis x-ray no acute finding, renal CT interval placement of right double-J stent with tip coiled in the collecting system and distal ureter adjacent to the large UVJ stone which is unchanged in position. Previously seen severe right hydronephrosis has markedly improved. 2. Small locule of air remains in the patulous right collecting system which may be related to interval intervention. Persistent infection is not entirely excluded by imaging, though previously seen perinephric and periureteral stranding has significantly improved. 3.  Significantly improved left hydronephrosis with only mild residual dilation of the collecting system. 4. Unchanged horseshoe morphology"  Cultures obtained, placed on antibiotics and admitted with one-to-one, IV Ativan as needed. Patient is managed IV fluids, IV antibiotics supportive care and mental status improved. Creatinine has nicely improved tolerating p.o., remains weak and deconditioned and is planning for PT OT and skilled nursing tfacility.  Discharge Diagnoses:   Acute metabolic encephalopathy with agitation/hallucination, unsteady gait falls at home: CT head nonacute on admission and nonfocal.  Likely multifactorial in the setting of infection, could be due to ciprofloxacin use.  Mental status nicely improved, doing well with bedtime Seroquel and would like to just stay on it.  Continue supportive care PT OT and SNF placement  Complicated catheter associated UTI/pyelonephritis BPH Chronic indwelling Foley in place Right double-J stent placed on last admission in August Large UVJ stone unchanged in position: Candi UTI-yeast in urine on last culture had Citrobacter frunedii.  Cultures were resistant to cefazolin, ceftriaxone, and nitrofurantoin.  Urine culture with yeast, added Diflucan-completed cefepime.Will need to follow-up with urology as outpatient.  Repeat CT abdomen as above-significantly improved left hydronephrosis, markedly improved previous right severe hydronephrosis, small locule of air in patulous right collecting system may be related to interval intervention, persistent infection is not entirely excluded. Cont  Proscar finasteride .  Continue diflucan 200 mg daily  x 14 days- stop on 12/29/20 .  AKI on CKD stage IIIa Baseline creatinine on last discharge 1.3.  Nicely improved o baseline.  OFF IV fluids.   Recent Labs    12/04/20 9678 12/05/20 9381 12/06/20 0448 12/07/20 0175 12/10/20 1957  12/14/20 0655 12/15/20 0030 12/16/20 0422 12/17/20 0431 12/18/20 0434   BUN 26* 31* 33* 28* 41* 39* 37* 33* 28* 22  CREATININE 1.55* 1.48* 1.35* 1.36* 2.38* 2.32* 2.30* 2.00* 1.69* 1.42*   Hypomagnesemia/Hypokalemia: Resolved Recent Labs  Lab 12/14/20 0655 12/15/20 0030 12/16/20 0422 12/17/20 0431 12/18/20 0434  K 3.1* 3.3* 3.5 3.5 3.8    Gait disturbance/falls at home: X-ray no acute fractures.  PT OT to cont and has recommended skilled nursing facility.    Hypertension: On amlodipine and losartan at home.  Blood pressure borderline controlled continue amlodipine.Holding ARB due to AKI-consider resuming if blood pressure is uncontrolled.  Anemia: Stable, likely from chronic disease.  Continue to monitor. Recent Labs  Lab 12/14/20 0655 12/15/20 0030 12/16/20 0422  HGB 8.9* 10.0* 9.3*  HCT 29.3* 32.5* 29.8*   History of DVT/PE on anticoagulation: Age indeterminate DVT in the left popliteal vein as well as acute PE in November/21, left leg DVT again seen on Doppler ultrasound on 12/05/2020.  Cont his DOAC  AAA, known to have dilation of thoracic aortic arch 3.3 centimeter in November/21, outpatient follow-up and surveillance will be needed  Inguinal hernia noted on CT scan with a small loops of bowel without signs of obstruction.Outpatient follow-up with surgery.  Monitor bowel movement.  Consults: CM  Subjective: Alert awake, oriented at baseline, eating well.  Ready for skilled nursing facility  Discharge Exam: Vitals:   12/19/20 0619 12/19/20 0842  BP: 122/76 130/74  Pulse: 86 82  Resp: 14 14  Temp: 98.4 F (36.9 C) 98.1 F (36.7 C)  SpO2: 98% 95%   General: Pt is alert, awake, not in acute distress Cardiovascular: RRR, S1/S2 +, no rubs, no gallops Respiratory: CTA bilaterally, no wheezing, no rhonchi Abdominal: Soft, NT, ND, bowel sounds + Extremities: no edema, no cyanosis  Discharge Instructions  Discharge Instructions     Discharge instructions   Complete by: As directed    Follow-up with urology in 1 to 2  weeks.  Check CBC/BMP in 1 week  Please call call MD or return to ER for similar or worsening recurring problem that brought you to hospital or if any fever,nausea/vomiting,abdominal pain, uncontrolled pain, chest pain,  shortness of breath or any other alarming symptoms.  Please follow-up your doctor as instructed in a week time and call the office for appointment.  Please avoid alcohol, smoking, or any other illicit substance and maintain healthy habits including taking your regular medications as prescribed.  You were cared for by a hospitalist during your hospital stay. If you have any questions about your discharge medications or the care you received while you were in the hospital after you are discharged, you can call the unit and ask to speak with the hospitalist on call if the hospitalist that took care of you is not available.  Once you are discharged, your primary care physician will handle any further medical issues. Please note that NO REFILLS for any discharge medications will be authorized once you are discharged, as it is imperative that you return to your primary care physician (or establish a relationship with a primary care physician if you do not have one) for your aftercare needs so that they can reassess your need for medications and monitor your lab values   Increase activity slowly   Complete by: As directed       Allergies as of 12/19/2020   No Known Allergies      Medication List     STOP  taking these medications    ciprofloxacin 500 MG tablet Commonly known as: CIPRO       TAKE these medications    amLODipine 5 MG tablet Commonly known as: NORVASC Take 1 tablet (5 mg total) by mouth daily.   finasteride 5 MG tablet Commonly known as: PROSCAR Take 5 mg by mouth at bedtime.   fluconazole 200 MG tablet Commonly known as: DIFLUCAN Take 1 tablet (200 mg total) by mouth daily. Start taking on: December 20, 2020   Fusion Plus Caps Take 1 capsule  by mouth daily.   olmesartan 20 MG tablet Commonly known as: BENICAR Take 20 mg by mouth at bedtime.   QUEtiapine 25 MG tablet Commonly known as: SEROQUEL Take 0.5 tablets (12.5 mg total) by mouth at bedtime.   rivaroxaban 20 MG Tabs tablet Commonly known as: XARELTO Take 20 mg by mouth daily with supper.   tamsulosin 0.4 MG Caps capsule Commonly known as: FLOMAX Take 0.4 mg by mouth daily.        Follow-up Information     Mila PalmerWolters, Sharon, MD Follow up in 1 week(s).   Specialty: Family Medicine Contact information: [address] [place] [address] [phone]         Jannifer HickGay, Matthew R, MD Follow up in 2 week(s).   Specialty: Urology Contact information: [address] [place] [address] [phone]                No Known Allergies  The results of significant diagnostics from this hospitalization (including imaging, microbiology, ancillary and laboratory) are listed below for reference.    Microbiology: Recent Results (from the past 240 hour(s))  Urine Culture     Status: None   Collection Time: 12/10/20  7:45 PM   Specimen: In/Out Cath Urine  Result Value Ref Range Status   Specimen Description   Final    IN/OUT CATH URINE Performed at [facility], [address] [place], [place] [address]    Special Requests   Final    NONE Performed at [facility], [address] [place], [place] [address]    Culture   Final    NO GROWTH Performed at [facility], [address] [place], [place] [address]    Report Status 12/12/2020 FINAL  Final  Urine Culture     Status: Abnormal   Collection Time: 12/14/20  6:55 AM   Specimen: Urine, Clean Catch  Result Value Ref Range Status   Specimen Description URINE, CLEAN CATCH  Final   Special Requests   Final    Normal Performed at [facility], [address] [place], [place] [address]    Culture 80,000 COLONIES/mL YEAST (A)   Final   Report Status 12/15/2020 FINAL  Final    Procedures/Studies:   Labs: BNP (last 3 results) Recent Labs    02/24/20 1223  BNP 285.5*   Basic Metabolic Panel: Recent Labs  Lab 12/14/20 0655 12/15/20 0030 12/16/20 0422 12/17/20 0431 12/18/20 0434  NA 138 141 140 138 137  K 3.1* 3.3* 3.5 3.5 3.8  CL 108 110 108 109 108  CO2 23 24 22  21* 21*  GLUCOSE 122* 93 78 83 94  BUN 39* 37* 33* 28* 22  CREATININE 2.32* 2.30* 2.00* 1.69* 1.42*  CALCIUM 8.4* 8.5* 8.6* 8.3* 8.1*  MG  --  1.6* 1.9  --   --    Liver Function Tests: Recent Labs  Lab 12/14/20 0655 12/16/20 0422  AST 17 16  ALT 12 13  ALKPHOS 39 39  BILITOT 0.6 1.1  PROT 5.6* 5.6*  ALBUMIN 2.3* 2.1*   No results for input(s): LIPASE, AMYLASE in the last 168 hours. No results for input(s): AMMONIA in the last 168 hours. CBC: Recent Labs  Lab 12/14/20 0655 12/15/20 0030 12/16/20 0422  WBC 9.8 9.2 8.7  NEUTROABS 7.4  --   --   HGB 8.9* 10.0* 9.3*  HCT 29.3* 32.5* 29.8*  MCV 84.2 83.3 82.8  PLT 246 275 250   Cardiac Enzymes: Recent Labs  Lab 12/15/20 0030  CKTOTAL 74   BNP: Invalid input(s): POCBNP CBG: Recent Labs  Lab 12/14/20 0634  GLUCAP 114*   D-Dimer No results for input(s): DDIMER in the last 72 hours. Hgb A1c No results for input(s): HGBA1C in the last 72 hours. Lipid Profile No results for input(s): CHOL, HDL, LDLCALC, TRIG, CHOLHDL, LDLDIRECT in the last 72 hours. Thyroid function studies No results for input(s): TSH, T4TOTAL, T3FREE, THYROIDAB in the last 72 hours.  Invalid input(s): FREET3 Anemia work up No results for input(s): VITAMINB12, FOLATE, FERRITIN, TIBC, IRON, RETICCTPCT in the last 72 hours. Urinalysis    Component Value Date/Time   COLORURINE YELLOW 12/14/2020 0655   APPEARANCEUR TURBID (A) 12/14/2020 0655   LABSPEC 1.014 12/14/2020 0655   PHURINE 5.0 12/14/2020 0655   GLUCOSEU NEGATIVE 12/14/2020 0655   HGBUR LARGE (A) 12/14/2020 0655   BILIRUBINUR  NEGATIVE 12/14/2020 0655   KETONESUR NEGATIVE 12/14/2020 0655   PROTEINUR 100 (A) 12/14/2020 0655   NITRITE NEGATIVE 12/14/2020 0655   LEUKOCYTESUR LARGE (A) 12/14/2020 0655   Sepsis Labs Invalid input(s): PROCALCITONIN,  WBC,  LACTICIDVEN Microbiology Recent Results (from the past 240 hour(s))  Urine Culture     Status: None   Collection Time: 12/10/20  7:45 PM   Specimen: In/Out Cath Urine  Result Value Ref Range Status   Specimen Description   Final    IN/OUT CATH URINE Performed at [facility], [address] [place], [place] [address]    Special Requests   Final    NONE Performed at [facility], [address] [place], [place] [address]    Culture   Final    NO GROWTH Performed at [facility], [address], [place], [place] [address]    Report Status 12/12/2020 FINAL  Final  Urine Culture     Status: Abnormal   Collection Time: 12/14/20  6:55 AM   Specimen: Urine, Clean Catch  Result Value Ref Range Status   Specimen Description URINE, CLEAN CATCH  Final   Special Requests   Final    Normal Performed at [facility], [address] [place], [place] [address]    Culture 80,000 COLONIES/mL YEAST (A)  Final   Report Status 12/15/2020 FINAL  Final     Time coordinating discharge: 35 minutes  SIGNED: Lanae Boast, MD  [facility] 12/19/2020, 2:48 PM  If 7PM-7AM, please contact night-coverage [email or web address]

## 2020-12-19 NOTE — Care Management Important Message (Signed)
Important Message  Patient Details  Name: Corey Cummings MRN: [id number] Date of Birth: 1931/04/09   Medicare Important Message Given:  Yes     [name] Stefan Church 12/19/2020, 4:18 PM

## 2020-12-19 NOTE — Evaluation (Signed)
Physical Therapy Evaluation Patient Details Name: Corey Cummings MRN: [id number] DOB: March 31, 1931 Today's Date: 12/19/2020  History of Present Illness  87 y.o. male [facility] to [facility] ED on 12/14/2020 with AMS. Pt recently hospitalized 8/28-8/31 with sepsis 2/2 UTI. Pt admitted for acute metabolic encephalopathy and UTI management.PMH includes HTN, chronic indwelling Foley catheter, nephrolithiasis, ED/PE on Xarelto, and BPH.  Clinical Impression  Pt is up to walk with PT today, but is weaker and requiring more help to get to side of bed, stand and take steps.  Contacted nursing to discuss the weakness with extreme level of cloudiness in cath bag.  Pt is expected to go to SNF for rehab following his medical care, and would still recommend this given the amount of assistance and safety concerns for this patient. Follow along acutely for goals of PT and note his ability to control standing balance as well as R knee discomfort.  Pt may have mult reasons for the functional change since last visit.       Recommendations for follow up therapy are one component of a multi-disciplinary discharge planning process, led by the attending physician.  Recommendations may be updated based on patient status, additional functional criteria and insurance authorization.  Follow Up Recommendations SNF;Supervision for mobility/OOB    Equipment Recommendations  3in1 (PT)    Recommendations for Other Services       Precautions / Restrictions Precautions Precautions: Fall Precaution Comments: oscillating tone with hips in standing Restrictions Weight Bearing Restrictions: No      Mobility  Bed Mobility Overal bed mobility: Needs Assistance Bed Mobility: Supine to Sit;Sit to Supine     Supine to sit: Mod assist Sit to supine: Min guard;Min assist   General bed mobility comments: pt is more dependent than his last visit    Transfers Overall transfer level: Needs assistance Equipment used: Rolling walker (2  wheeled);1 person hand held assist Transfers: Sit to/from Stand Sit to Stand: Mod assist            Ambulation/Gait Ambulation/Gait assistance: Mod assist Gait Distance (Feet): 25 Feet (5 x 5) Assistive device: Rolling walker (2 wheeled) Gait Pattern/deviations: Step-to pattern;Decreased stride length;Shuffle;Trunk flexed Gait velocity: reduced   General Gait Details: Pt was much more uncoordinated today, mod assist with some oscillating tone on hips and requires help to balance and control shifting of walker to sidestep for safety  Stairs            Wheelchair Mobility    Modified Rankin (Stroke Patients Only)       Balance Overall balance assessment: Needs assistance Sitting-balance support: Feet supported;Bilateral upper extremity supported Sitting balance-Leahy Scale: Poor     Standing balance support: Bilateral upper extremity supported;During functional activity Standing balance-Leahy Scale: Poor                               Pertinent Vitals/Pain Pain Assessment: Faces Faces Pain Scale: Hurts little more Pain Location: R knee in standing Pain Descriptors / Indicators: Grimacing;Guarding Pain Intervention(s): Monitored during session;Repositioned;Other (comment) (repetitive standing to gradually increase tolerance)    Home Living                        Prior Function                 Hand Dominance        Extremity/Trunk Assessment  Communication      Cognition Arousal/Alertness: Lethargic Behavior During Therapy: Flat affect Overall Cognitive Status: No family/caregiver present to determine baseline cognitive functioning Area of Impairment: Awareness;Safety/judgement;Problem solving;Following commands                     Memory: Decreased short-term memory Following Commands: Follows one step commands with increased time Safety/Judgement: Decreased awareness of safety Awareness:  Emergent;Anticipatory Problem Solving: Slow processing;Requires verbal cues;Requires tactile cues General Comments: Pt is slow to react to LOB and sequences to move, requiring repetitive instructions to get to side of bed      General Comments General comments (skin integrity, edema, etc.): Pt was assisted to sit on sdie of bed and control balance.  He is losing focus on the task, but also noted urine in bag is quite cloudy.  Notified nursing    Exercises     Assessment/Plan    PT Assessment    PT Problem List         PT Treatment Interventions      PT Goals (Current goals can be found in the Care Plan section)  Acute Rehab PT Goals Patient Stated Goal: to have R knee less painful    Frequency Min 3X/week   Barriers to discharge        Co-evaluation               AM-PAC PT "6 Clicks" Mobility  Outcome Measure Help needed turning from your back to your side while in a flat bed without using bedrails?: A Little Help needed moving from lying on your back to sitting on the side of a flat bed without using bedrails?: A Lot Help needed moving to and from a bed to a chair (including a wheelchair)?: A Lot Help needed standing up from a chair using your arms (e.g., wheelchair or bedside chair)?: A Lot Help needed to walk in hospital room?: A Lot Help needed climbing 3-5 steps with a railing? : Total 6 Click Score: 12    End of Session Equipment Utilized During Treatment: Gait belt Activity Tolerance: Patient limited by fatigue;Treatment limited secondary to medical complications (Comment) Patient left: in bed;with call bell/phone within reach;with bed alarm set Nurse Communication: Mobility status;Other (comment) (cloudy urine in cath bag) PT Visit Diagnosis: Unsteadiness on feet (R26.81)    Time: 7341-9379 PT Time Calculation (min) (ACUTE ONLY): 30 min   Charges:     PT Treatments $Gait Training: 8-22 mins $Therapeutic Activity: 8-22 mins       Ivar Drape 12/19/2020, 3:47 PM  Samul Dada, PT MS Acute Rehab Dept. Number: [facility] [phone] and [facility] [phone]

## 2020-12-19 NOTE — Care Management Important Message (Signed)
Important Message  Patient Details  Name: Corey Cummings MRN: [id number] Date of Birth: 06/18/1930   Medicare Important Message Given:  Yes     Darnetta Kesselman Cummings Church 12/19/2020, 4:19 PM

## 2020-12-20 MED ORDER — QUETIAPINE FUMARATE 25 MG PO TABS
12.5000 mg | ORAL_TABLET | Freq: Every day | ORAL | Status: DC
  Administered 2020-12-20: 12.5 mg via ORAL
  Filled 2020-12-20: qty 1

## 2020-12-20 NOTE — Progress Notes (Signed)
Occupational Therapy Treatment Patient Details Name: Corey Cummings MRN: [id number] DOB: February 14, 1931 Today's Date: 12/20/2020   History of present illness 87 y.o. male presents to [facility] ED on 12/14/2020 with AMS. Pt recently hospitalized 8/28-8/31 with sepsis 2/2 UTI. Pt admitted for acute metabolic encephalopathy and UTI management.PMH includes HTN, chronic indwelling Foley catheter, nephrolithiasis, ED/PE on Xarelto, and BPH.   OT comments  Pt making steady progress towards OT goals this session. Session focus on BADL reeducation and functional ADL transfers. Pt continues to present with impaired cognition, impaired safety awareness and impaired balance. Pt currently requires MOD A for stand pivot transfer from EOB<>BSC with RW and MAX-total A for 3/3 toileting tasks. Pt requires step by step cues to sequence all mobility tasks. Pt would continue to benefit from skilled occupational therapy while admitted and after d/c to address the below listed limitations in order to improve overall functional mobility and facilitate independence with BADL participation. DC plan remains appropriate, will follow acutely per POC.      Recommendations for follow up therapy are one component of a multi-disciplinary discharge planning process, led by the attending physician.  Recommendations may be updated based on patient status, additional functional criteria and insurance authorization.    Follow Up Recommendations  SNF;Supervision/Assistance - 24 hour    Equipment Recommendations  Other (comment) (defer to next venue of care)    Recommendations for Other Services      Precautions / Restrictions Precautions Precautions: Fall Precaution Comments: oscillating tone with hips in standing Restrictions Weight Bearing Restrictions: No       Mobility Bed Mobility Overal bed mobility: Needs Assistance Bed Mobility: Sit to Supine       Sit to supine: Min guard;HOB elevated   General bed mobility comments: min  guard for safety to return to supine, pt found seated EOB    Transfers Overall transfer level: Needs assistance Equipment used: Rolling walker (2 wheeled) Transfers: Sit to/from [employer] Sit to Stand: Mod assist Stand pivot transfers: Mod assist       General transfer comment: MOD A for sit<>stand from EOB and BSC, MOD A to sequence pivotal steps from EOB<>BSC. multiple attempts needed to come into stand presenting with ataxic balance gait pattern    Balance Overall balance assessment: Needs assistance Sitting-balance support: Feet supported;Bilateral upper extremity supported Sitting balance-Leahy Scale: Poor     Standing balance support: Bilateral upper extremity supported;During functional activity Standing balance-Leahy Scale: Poor Standing balance comment: reliant on UE support of RW                           ADL either performed or assessed with clinical judgement   ADL Overall ADL's : Needs assistance/impaired             Lower Body Bathing: Total assistance;Sit to/from stand;Bed level Lower Body Bathing Details (indicate cue type and reason): simulated via posterior pericare         Toilet Transfer: Moderate assistance;BSC;RW Toilet Transfer Details (indicate cue type and reason): MOD A for stand pivot from EOB<>BSC with RW Toileting- Clothing Manipulation and Hygiene: Maximal assistance;Total assistance;Sit to/from stand;Bed level       Functional mobility during ADLs: Moderate assistance;Rolling walker;Cueing for safety;Cueing for sequencing General ADL Comments: pt continues to present with impaired cognition, impulsivity, and impaired activity tolerance     Vision       Perception     Praxis      Cognition  Arousal/Alertness: Awake/alert Behavior During Therapy: Impulsive;Restless Overall Cognitive Status: No family/caregiver present to determine baseline cognitive functioning Area of Impairment:  Orientation;Memory;Following commands;Safety/judgement;Awareness;Problem solving                 Orientation Level: Disoriented to;Place;Situation [place] think he is at [facility])   Memory: Decreased short-term memory Following Commands: Follows one step commands inconsistently;Follows one step commands with increased time;Follows multi-step commands inconsistently Safety/Judgement: Decreased awareness of safety;Decreased awareness of deficits Awareness: Intellectual Problem Solving: Slow processing;Difficulty sequencing;Requires verbal cues;Requires tactile cues General Comments: pt sitting EOB upon arrival attempting to walk to BR, noted to impulsive with all mobility needing step by step cues to sequence all aspects of mobility        Exercises     Shoulder Instructions       General Comments noted redness on bottom applied barrier cream    Pertinent Vitals/ Pain       Pain Assessment: No/denies pain  Home Living                                          Prior Functioning/Environment              Frequency  Min 2X/week        Progress Toward Goals  OT Goals(current goals can now be found in the care plan section)  Progress towards OT goals: Progressing toward goals  Acute Rehab OT Goals Patient Stated Goal: to use the bathroom OT Goal Formulation: With patient Time For Goal Achievement: 12/29/20 Potential to Achieve Goals: Fair  Plan Discharge plan remains appropriate;Frequency remains appropriate    Co-evaluation                 AM-PAC OT "6 Clicks" Daily Activity     Outcome Measure   Help from another person eating meals?: A Little Help from another person taking care of personal grooming?: A Lot   Help from another person bathing (including washing, rinsing, drying)?: A Lot Help from another person to put on and taking off regular upper body clothing?: A Lot Help from another person to  put on and taking off regular lower body clothing?: Total 6 Click Score: 10    End of Session Equipment Utilized During Treatment: Gait belt;Rolling walker;Other (comment) (BSC)  OT Visit Diagnosis: Unsteadiness on feet (R26.81);Other abnormalities of gait and mobility (R26.89);Muscle weakness (generalized) (M62.81);Other symptoms and signs involving cognitive function   Activity Tolerance Patient tolerated treatment well   Patient Left in bed;with call bell/phone within reach;with bed alarm set   Nurse Communication Mobility status;Other (comment) (applied barrier cream, pt had DM during session)        Time: 2725-3664 OT Time Calculation (min): 25 min  Charges: OT General Charges $OT Visit: 1 Visit OT Treatments $Self Care/Home Management : 23-37 mins  Lenor Derrick., COTA/L Acute Rehabilitation Services [phone]   Barron Schmid 12/20/2020, 2:26 PM

## 2020-12-20 NOTE — Progress Notes (Signed)
Zena Amos (step-daughter) called for an update. Updated family member, reported that patient had not experienced change in condition, patient currently resting in room, displaying mild level of confusion. Family member voiced concern about continued altered LOC. Family concerns addressed.

## 2020-12-20 NOTE — Plan of Care (Signed)
  Problem: Activity: Goal: Risk for activity intolerance will decrease Outcome: Progressing   Problem: Nutrition: Goal: Adequate nutrition will be maintained Outcome: Progressing   Problem: Coping: Goal: Level of anxiety will decrease Outcome: Progressing   

## 2020-12-20 NOTE — Progress Notes (Addendum)
Seen and examined the patient this morning. He is alert awake oriented to name, date of birth and current hospital  Mildly confused may be but when checked on later on appears appropriate. Continue same plan per discharge summary completed yesterday we will cut down Seroquel to 12.5 mg bedtime tonight. Discussed during rounds. Awaiting for insurance authorization.

## 2020-12-21 DIAGNOSIS — G9341 Metabolic encephalopathy: Secondary | ICD-10-CM | POA: Diagnosis not present

## 2020-12-21 LAB — CBC
HCT: 29.3 % — ABNORMAL LOW (ref 39.0–52.0)
Hemoglobin: 9.1 g/dL — ABNORMAL LOW (ref 13.0–17.0)
MCH: 25.9 pg — ABNORMAL LOW (ref 26.0–34.0)
MCHC: 31.1 g/dL (ref 30.0–36.0)
MCV: 83.2 fL (ref 80.0–100.0)
Platelets: 253 10*3/uL (ref 150–400)
RBC: 3.52 MIL/uL — ABNORMAL LOW (ref 4.22–5.81)
RDW: 17.7 % — ABNORMAL HIGH (ref 11.5–15.5)
WBC: 10.6 10*3/uL — ABNORMAL HIGH (ref 4.0–10.5)
nRBC: 0 % (ref 0.0–0.2)

## 2020-12-21 LAB — BASIC METABOLIC PANEL
Anion gap: 12 (ref 5–15)
BUN: 27 mg/dL — ABNORMAL HIGH (ref 8–23)
CO2: 23 mmol/L (ref 22–32)
Calcium: 8.7 mg/dL — ABNORMAL LOW (ref 8.9–10.3)
Chloride: 102 mmol/L (ref 98–111)
Creatinine, Ser: 1.48 mg/dL — ABNORMAL HIGH (ref 0.61–1.24)
GFR, Estimated: 45 mL/min — ABNORMAL LOW (ref >60)
Glucose, Bld: 119 mg/dL — ABNORMAL HIGH (ref 70–99)
Potassium: 4 mmol/L (ref 3.5–5.1)
Sodium: 137 mmol/L (ref 135–145)

## 2020-12-21 MED ORDER — ENOXAPARIN SODIUM 60 MG/0.6ML IJ SOSY
60.0000 mg | PREFILLED_SYRINGE | INTRAMUSCULAR | Status: DC
  Administered 2020-12-22 – 2020-12-26 (×5): 60 mg via SUBCUTANEOUS
  Filled 2020-12-21 (×5): qty 0.6

## 2020-12-21 NOTE — Progress Notes (Signed)
PROGRESS NOTE    Corey Cummings  DQQ:[id number] DOB: October 06, 1930 DOA: 12/14/2020 PCP: [name], MD   Brief narrative 87 year old male with hypertension chronic indwelling Foley catheter, nephrolithiasis, PE on Xarelto, BPH recently hospitalized 8/8-/1931 for severe sepsis due to catheter associated urinary tract infection with hydronephrosis secondary to right UVJ calculus, had a stent placed culture grew Citrobacter friendly and discharged on complete course of ciprofloxacin.  Since discharge patient has been unsteady on his feet, was seen in the ED 9/3 after he took out his Foley catheter.  He has been acting weird confused hallucinating at times agitated, has had falls. In the ED CT brain no acute finding, afebrile, labs showed hemoglobin 8.9 g potassium 3.1 creatinine 2.3 UA large leukocyte many bacteria more than 50 RBC multiple WBC chest x-ray and pelvis x-ray no acute finding, renal CT interval placement of right double-J stent with tip coiled in the collecting system and distal ureter adjacent to the large UVJ stone which is unchanged in position. Previously seen severe right hydronephrosis has markedly improved. 2. Small locule of air remains in the patulous right collecting system which may be related to interval intervention. Persistent infection is not entirely excluded by imaging, though previously seen perinephric and periureteral stranding has significantly improved. 3. Significantly improved left hydronephrosis with only mild residual dilation of the collecting system. 4. Unchanged horseshoe morphology"  Cultures obtained, placed on antibiotics and admitted with one-to-one, IV Ativan as needed. Patient is managed IV fluids, IV antibiotics supportive care and mental status improved. Renal function improved.  Remains weak deconditioned waiting for insurance approval for skilled nursing facility.  Subjective: Seen this morning is much more alert awake resting comfortably mild baseline  confusion but interactive and responding appropriately  Assessment & Plan:  Acute metabolic encephalopathy with agitation/hallucination, unsteady gait falls at home: CT head nonacute on admission and nonfocal.  Likely multifactorial in the setting of infection, could be due to ciprofloxacin use.  Mental status nicely improved, doing well with bedtime Seroquel -dose cut down to 12 and half milligrams and doing well.  Continue supportive care.  Pending placement.    Complicated catheter associated UTI/pyelonephritis BPH Chronic indwelling Foley in place Right double-J stent placed on last admission in August Large UVJ stone unchanged in position: Candi UTI-yeast in urine on last culture had Citrobacter frunedii.  Cultures were resistant to cefazolin, ceftriaxone, and nitrofurantoin.  Urine culture with yeast, added Diflucan-completed cefepime.Will need to follow-up with urology as outpatient.  Repeat CT abdomen as above-significantly improved left hydronephrosis, markedly improved previous right severe hydronephrosis, small locule of air in patulous right collecting system may be related to interval intervention, persistent infection is not entirely excluded. Cont  Proscar finasteride .  Continue diflucan 200 mg daily  x 14 days- stop on 12/29/20 .  AKI on CKD stage IIIa Baseline creatinine on last discharge 1.3, now at 1.4, stableNicely improved close to baseline.  Off fluids and encouraging oral intake. Recent Labs    12/06/20 0448 12/07/20 0511 12/10/20 1957 12/14/20 0655 12/15/20 0030 12/16/20 0422 12/17/20 0431 12/18/20 0434 12/19/20 1639 12/21/20 0404  BUN 33* 28* 41* 39* 37* 33* 28* 22 23 27*  CREATININE 1.35* 1.36* 2.38* 2.32* 2.30* 2.00* 1.69* 1.42* 1.55* 1.48*    Hypomagnesemia/Hypokalemia: Resolved Recent Labs  Lab 12/16/20 0422 12/17/20 0431 12/18/20 0434 12/19/20 1639 12/21/20 0404  K 3.5 3.5 3.8 4.0 4.0     Gait disturbance/falls at home: X-ray no acute fractures.   PT OT to cont and  has recommended skilled nursing facility.    Hypertension: On amlodipine and losartan at home.  Blood pressure borderline controlled continue amlodipine.Holding ARB due to AKI-consider resuming if blood pressure is uncontrolled.  Anemia: Stable, likely from chronic disease.  Continue to monitor. Recent Labs  Lab 12/15/20 0030 12/16/20 0422 12/19/20 1639 12/21/20 0404  HGB 10.0* 9.3* 9.5* 9.1*  HCT 32.5* 29.8* 30.8* 29.3*    History of DVT/PE on anticoagulation: Age indeterminate DVT in the left popliteal vein as well as acute PE in November/21, left leg DVT again seen on Doppler ultrasound on 12/05/2020.  Cont his DOAC  AAA, known to have dilation of thoracic aortic arch 3.3 centimeter in November/21, outpatient follow-up and surveillance will be needed  Inguinal hernia noted on CT scan with a small loops of bowel without signs of obstruction.  Outpatient follow-up with surgery.  Monitor bowel movement  Diet Order             DIET DYS 3 Room service appropriate? Yes; Fluid consistency: Thin  Diet effective now                 Patient's Body mass index is 19.89 kg/m. DVT prophylaxis: lovenox Code Status:   Code Status: Full Code  Family Communication: plan of care discussed with patient at bedside. Status is: Inpatient  Remains inpatient appropriate because: Unsafe disposition   Dispo: The patient is from: Home              Anticipated d/c is to: SNF.              Patient currently is medically stable for discharge   Difficult to place patient No Unresulted Labs (From admission, onward)    None       Medications reviewed:  Scheduled Meds:  acidophilus  1 capsule Oral Daily   amLODipine  5 mg Oral Daily   Chlorhexidine Gluconate Cloth  6 each Topical Daily   enoxaparin (LOVENOX) injection  60 mg Subcutaneous Q12H   finasteride  5 mg Oral QHS   fluconazole  200 mg Oral Daily   QUEtiapine  12.5 mg Oral QHS   sodium chloride flush  3 mL  Intravenous Q12H   tamsulosin  0.4 mg Oral Daily   Continuous Infusions:   Consultants:see note  Procedures:see note Antimicrobials: Anti-infectives (From admission, onward)    Start     Dose/Rate Route Frequency Ordered Stop   12/20/20 0000  fluconazole (DIFLUCAN) 200 MG tablet        200 mg Oral Daily 12/19/20 1444     12/16/20 1130  fluconazole (DIFLUCAN) tablet 200 mg        200 mg Oral Daily 12/16/20 1041 12/30/20 0959   12/14/20 1045  ceFEPIme (MAXIPIME) 2 g in sodium chloride 0.9 % 100 mL IVPB        2 g 200 mL/hr over 30 Minutes Intravenous Every 24 hours 12/14/20 1043 12/18/20 1151   12/14/20 0800  cefTRIAXone (ROCEPHIN) 1 g in sodium chloride 0.9 % 100 mL IVPB        1 g 200 mL/hr over 30 Minutes Intravenous  Once 12/14/20 0751 12/14/20 0953      Culture/Microbiology    Component Value Date/Time   SDES URINE, CLEAN CATCH 12/14/2020 0655   SPECREQUEST  12/14/2020 0655    Normal Performed at [facility], [address] [place], [place] [address]    CULT 80,000 COLONIES/mL YEAST (A) 12/14/2020 0655   REPTSTATUS 12/15/2020 FINAL  12/14/2020 0655    Other culture-see note  Objective: Vitals: Today's Vitals   12/20/20 1936 12/20/20 2247 12/21/20 0502 12/21/20 0858  BP: 116/68 122/69 129/64 123/83  Pulse: 98 94 96 89  Resp: 16 18 18 16   Temp: 98.7 F (37.1 C) 98.3 F (36.8 C) 97.7 F (36.5 C) 97.7 F (36.5 C)  TempSrc: Oral Oral Oral Oral  SpO2: 98% 99% 98% 99%  Weight:      PainSc: 0-No pain  0-No pain     Intake/Output Summary (Last 24 hours) at 12/21/2020 1258 Last data filed at 12/21/2020 0800 Gross per 24 hour  Intake 720 ml  Output 550 ml  Net 170 ml    Filed Weights   12/15/20 2219  Weight: 61.1 kg   Weight change:   Intake/Output from previous day: 09/13 0701 - 09/14 0700 In: 500 [P.O.:500] Out: 850 [Urine:850] Intake/Output this shift: Total I/O In: 220 [P.O.:220] Out: -  Filed Weights   12/15/20 2219  Weight: 61.1 kg    Examination: General exam: AAO, pleasant,?  Memory issues but interacting appropriately HEENT:Oral mucosa moist, Ear/Nose WNL grossly, dentition normal. Respiratory system: bilaterally clear breath sounds, no use of accessory muscle Cardiovascular system: S1 & S2 +, No JVD,. Gastrointestinal system: Abdomen soft, NT,ND, BS+ Nervous System:Alert, awake, moving extremities and grossly nonfocal Extremities: no edema, distal peripheral pulses palpable.  Skin: No rashes,no icterus. MSK: Normal muscle bulk,tone, power  Foley catheter in place.  Data Reviewed: I have personally reviewed following labs and imaging studies CBC: Recent Labs  Lab 12/15/20 0030 12/16/20 0422 12/19/20 1639 12/21/20 0404  WBC 9.2 8.7 10.4 10.6*  HGB 10.0* 9.3* 9.5* 9.1*  HCT 32.5* 29.8* 30.8* 29.3*  MCV 83.3 82.8 83.2 83.2  PLT 275 250 223 253    Basic Metabolic Panel: Recent Labs  Lab 12/15/20 0030 12/16/20 0422 12/17/20 0431 12/18/20 0434 12/19/20 1639 12/21/20 0404  NA 141 140 138 137 136 137  K 3.3* 3.5 3.5 3.8 4.0 4.0  CL 110 108 109 108 104 102  CO2 24 22 21* 21* 25 23  GLUCOSE 93 78 83 94 140* 119*  BUN 37* 33* 28* 22 23 27*  CREATININE 2.30* 2.00* 1.69* 1.42* 1.55* 1.48*  CALCIUM 8.5* 8.6* 8.3* 8.1* 8.4* 8.7*  MG 1.6* 1.9  --   --   --   --     GFR: Estimated Creatinine Clearance: 29.8 mL/min (A) (by C-G formula based on SCr of 1.48 mg/dL (H)). Liver Function Tests: Recent Labs  Lab 12/16/20 0422  AST 16  ALT 13  ALKPHOS 39  BILITOT 1.1  PROT 5.6*  ALBUMIN 2.1*    No results for input(s): LIPASE, AMYLASE in the last 168 hours. No results for input(s): AMMONIA in the last 168 hours. Coagulation Profile: No results for input(s): INR, PROTIME in the last 168 hours. Cardiac Enzymes: Recent Labs  Lab 12/15/20 0030  CKTOTAL 74    BNP (last 3 results) No results for input(s): PROBNP in the last 8760 hours. HbA1C: No results for input(s): HGBA1C in the last 72  hours. CBG: No results for input(s): GLUCAP in the last 168 hours.  Lipid Profile: No results for input(s): CHOL, HDL, LDLCALC, TRIG, CHOLHDL, LDLDIRECT in the last 72 hours. Thyroid Function Tests: No results for input(s): TSH, T4TOTAL, FREET4, T3FREE, THYROIDAB in the last 72 hours. Anemia Panel: No results for input(s): VITAMINB12, FOLATE, FERRITIN, TIBC, IRON, RETICCTPCT in the last 72 hours. Sepsis Labs: No results for input(s):  PROCALCITON, LATICACIDVEN in the last 168 hours.  Recent Results (from the past 240 hour(s))  Urine Culture     Status: Abnormal   Collection Time: 12/14/20  6:55 AM   Specimen: Urine, Clean Catch  Result Value Ref Range Status   Specimen Description URINE, CLEAN CATCH  Final   Special Requests   Final    Normal Performed at [facility], [address], [place], [place] [address]    Culture 80,000 COLONIES/mL YEAST (A)  Final   Report Status 12/15/2020 FINAL  Final      Radiology Studies: No results found.   LOS: 7 days   Lanae Boast, MD [facility]  12/21/2020, 12:58 PM

## 2020-12-21 NOTE — TOC Progression Note (Signed)
Transition of Care [facility]) - Progression Note    Patient Details  Name: Zyad Boomer MRN: [id number] Date of Birth: [birth year]  [facility]) CM/SW Contact  Okey Dupre Lazaro Arms, LCSW Phone Number: 12/21/2020, 5:02 PM  Clinical Narrative:  Updated therapy notes sent to [employer] to be forwarded to insurance. Call later made to [facility], admissions director and message left regarding additional therapy notes being sent if needed for insurance auth.  CSW will continue to follow and provide SW intervention services as needed through discharge.      Expected Discharge Plan: Skilled Nursing Facility Barriers to Discharge: Continued Medical Work up  Expected Discharge Plan and Services Expected Discharge Plan: Skilled Nursing Facility In-house Referral: Clinical Social Work   Post Acute Care Choice: Skilled Nursing Facility Living arrangements for the past 2 months: Single Family Home (Patient has his own home, but was staying with step- daughter [name] before being admitted to hospital) Expected Discharge Date: 12/19/20                                     Social Determinants of Health (SDOH) Interventions  None requested or needed at this time.  Readmission Risk Interventions No flowsheet data found.

## 2020-12-21 NOTE — Progress Notes (Signed)
ANTICOAGULATION CONSULT NOTE - Consult  Pharmacy Consult for Xarelto >> Lovenox Indication: History of pulmonary embolus and DVT, current DVT  No Known Allergies  Vital Signs: Temp: 97.7 F (36.5 C) (09/14 0858) Temp Source: Oral (09/14 0858) BP: 123/83 (09/14 0858) Pulse Rate: 89 (09/14 0858)  Labs: Recent Labs    12/19/20 1639 12/21/20 0404  HGB 9.5* 9.1*  HCT 30.8* 29.3*  PLT 223 253  CREATININE 1.55* 1.48*     Estimated Creatinine Clearance: 29.8 mL/min (A) (by C-G formula based on SCr of 1.48 mg/dL (H)).   Medical History: Past Medical History:  Diagnosis Date   BPH (benign prostatic hyperplasia)    Hypertension     Medications:  Medications Prior to Admission  Medication Sig Dispense Refill Last Dose   amLODipine (NORVASC) 5 MG tablet Take 1 tablet (5 mg total) by mouth daily. 30 tablet 1 12/13/2020   ciprofloxacin (CIPRO) 500 MG tablet Take 1 tablet (500 mg total) by mouth 2 (two) times daily. 20 tablet 0 12/13/2020   finasteride (PROSCAR) 5 MG tablet Take 5 mg by mouth at bedtime.   12/13/2020   Iron-FA-B Cmp-C-Biot-Probiotic (FUSION PLUS) CAPS Take 1 capsule by mouth daily.   12/13/2020   olmesartan (BENICAR) 20 MG tablet Take 20 mg by mouth at bedtime.   12/13/2020   rivaroxaban (XARELTO) 20 MG TABS tablet Take 20 mg by mouth daily with supper.   12/13/2020 at 1930   tamsulosin (FLOMAX) 0.4 MG CAPS capsule Take 0.4 mg by mouth daily.   12/13/2020    Assessment: Corey Cummings is an 87 yo male with a history of age-indeterminate DVT and acute PE in November 2021. During recent admission, dopplers showed left leg DVT again on 8/29 and patient continued on Xarelto. Pharmacy consulted to change Xarelto to Lovenox for now.  Scr elevated from baseline at 1.48. Due to decreased renal clearance, will decrease to 60mg  once daily (1mg /kg/day) until renal function improves.  Goal of Therapy:  Anti-Xa level 0.6-1 units/ml 4hrs after LMWH dose given Monitor platelets by  anticoagulation protocol: Yes   Plan:  Decrease Lovenox from 60 mg (1 mg/kg) every 12 hours to every 24 hours Monitor CBC, renal function, and for signs and symptoms of bleeding   Thank you for allowing pharmacy to be a part of this patient's care.  , PharmD Clinical Pharmacist

## 2020-12-21 NOTE — Progress Notes (Signed)
Called daughter [name] back twice got voicemail

## 2020-12-21 NOTE — Progress Notes (Signed)
Physical Therapy Treatment Patient Details Name: Corey Cummings MRN: [id number] DOB: December 19, 1930 Today's Date: 12/21/2020   History of Present Illness 87 y.o. male presents to [facility] ED on 12/14/2020 with AMS. Pt recently hospitalized 8/28-8/31 with sepsis 2/2 UTI. Pt admitted for acute metabolic encephalopathy and UTI management.PMH includes HTN, chronic indwelling Foley catheter, nephrolithiasis, ED/PE on Xarelto, and BPH.    PT Comments    Pt demonstrates significant deficits in balance, functional mobility and gait. Pt with persistent R sided lean during session, requiring physical assistance during all standing to prevent falls. Pt with poor awareness of R side, needing physical assistance to place hand on walker. PT attempts to utilize mirror for visual cueing of R lean which is ineffective at this time. Pt will benefit from continued aggressive mobilization to improve balance and reduce falls risk. PT recommends SNF placement.   Recommendations for follow up therapy are one component of a multi-disciplinary discharge planning process, led by the attending physician.  Recommendations may be updated based on patient status, additional functional criteria and insurance authorization.  Follow Up Recommendations  SNF;Supervision for mobility/OOB     Equipment Recommendations  3in1 (PT)    Recommendations for Other Services       Precautions / Restrictions Precautions Precautions: Fall Precaution Comments: strong R sided lean Restrictions Weight Bearing Restrictions: No     Mobility  Bed Mobility Overal bed mobility: Needs Assistance Bed Mobility: Supine to Sit;Sit to Supine     Supine to sit: Min assist;HOB elevated Sit to supine: Mod assist;HOB elevated        Transfers Overall transfer level: Needs assistance Equipment used: Rolling walker (2 wheeled) Transfers: Sit to/from [employer] Sit to Stand: Mod assist Stand pivot transfers: Mod assist        General transfer comment: pt with R sided lean, requires cues for hand placement as well as physical assistance to place R hand on walker  Ambulation/Gait Ambulation/Gait assistance: Mod assist Gait Distance (Feet): 4 Feet Assistive device: Rolling walker (2 wheeled) Gait Pattern/deviations: Step-to pattern Gait velocity: reduced Gait velocity interpretation: <1.8 ft/sec, indicate of risk for recurrent falls General Gait Details: pt with slowed step-to gait, strong R sided lean requiring physical assistance throughout to prevent potential fall   Stairs             Wheelchair Mobility    Modified Rankin (Stroke Patients Only)       Balance Overall balance assessment: Needs assistance Sitting-balance support: No upper extremity supported;Feet supported Sitting balance-Leahy Scale: Fair   Postural control: Right lateral lean Standing balance support: Bilateral upper extremity supported Standing balance-Leahy Scale: Poor Standing balance comment: min-modA with R lateral lean                            Cognition Arousal/Alertness: Awake/alert Behavior During Therapy: Restless;Impulsive Overall Cognitive Status: Impaired/Different from baseline Area of Impairment: Orientation;Attention;Memory;Following commands;Safety/judgement;Awareness;Problem solving                 Orientation Level: Disoriented to;Place;Time;Situation Current Attention Level: Focused Memory: Decreased recall of precautions;Decreased short-term memory Following Commands: Follows one step commands inconsistently;Follows one step commands with increased time Safety/Judgement: Decreased awareness of deficits Awareness: Intellectual Problem Solving: Decreased initiation;Difficulty sequencing;Requires verbal cues;Requires tactile cues        Exercises      General Comments General comments (skin integrity, edema, etc.): VSS on RA      Pertinent Vitals/Pain Pain Assessment:  No/denies pain    Home Living                      Prior Function            PT Goals (current goals can now be found in the care plan section) Acute Rehab PT Goals Patient Stated Goal: to use the bathroom Progress towards PT goals: Not progressing toward goals - comment (worsening balance and cognition)    Frequency    Min 2X/week      PT Plan Frequency needs to be updated    Co-evaluation              AM-PAC PT "6 Clicks" Mobility   Outcome Measure  Help needed turning from your back to your side while in a flat bed without using bedrails?: A Little Help needed moving from lying on your back to sitting on the side of a flat bed without using bedrails?: A Little Help needed moving to and from a bed to a chair (including a wheelchair)?: A Lot Help needed standing up from a chair using your arms (e.g., wheelchair or bedside chair)?: A Lot Help needed to walk in hospital room?: A Lot Help needed climbing 3-5 steps with a railing? : Total 6 Click Score: 13    End of Session   Activity Tolerance: Patient tolerated treatment well Patient left: in bed;with call bell/phone within reach;with bed alarm set Nurse Communication: Mobility status PT Visit Diagnosis: Unsteadiness on feet (R26.81)     Time: 6389-3734 PT Time Calculation (min) (ACUTE ONLY): 27 min  Charges:  $Therapeutic Activity: 23-37 mins                     Arlyss Gandy, PT, DPT Acute Rehabilitation Pager: [phone]    Arlyss Gandy 12/21/2020, 10:31 AM

## 2020-12-22 DIAGNOSIS — G9341 Metabolic encephalopathy: Secondary | ICD-10-CM | POA: Diagnosis not present

## 2020-12-22 LAB — CBC WITH DIFFERENTIAL/PLATELET
Abs Immature Granulocytes: 0.03 10*3/uL (ref 0.00–0.07)
Basophils Absolute: 0.1 10*3/uL (ref 0.0–0.1)
Basophils Relative: 1 %
Eosinophils Absolute: 0.1 10*3/uL (ref 0.0–0.5)
Eosinophils Relative: 1 %
HCT: 31 % — ABNORMAL LOW (ref 39.0–52.0)
Hemoglobin: 9.3 g/dL — ABNORMAL LOW (ref 13.0–17.0)
Immature Granulocytes: 0 %
Lymphocytes Relative: 13 %
Lymphs Abs: 1.4 10*3/uL (ref 0.7–4.0)
MCH: 25.5 pg — ABNORMAL LOW (ref 26.0–34.0)
MCHC: 30 g/dL (ref 30.0–36.0)
MCV: 85.2 fL (ref 80.0–100.0)
Monocytes Absolute: 0.5 10*3/uL (ref 0.1–1.0)
Monocytes Relative: 4 %
Neutro Abs: 8.6 10*3/uL — ABNORMAL HIGH (ref 1.7–7.7)
Neutrophils Relative %: 81 %
Platelets: 336 10*3/uL (ref 150–400)
RBC: 3.64 MIL/uL — ABNORMAL LOW (ref 4.22–5.81)
RDW: 17.6 % — ABNORMAL HIGH (ref 11.5–15.5)
WBC: 10.6 10*3/uL — ABNORMAL HIGH (ref 4.0–10.5)
nRBC: 0 % (ref 0.0–0.2)

## 2020-12-22 LAB — URINALYSIS, ROUTINE W REFLEX MICROSCOPIC
Bilirubin Urine: NEGATIVE
Glucose, UA: NEGATIVE mg/dL
Ketones, ur: 5 mg/dL — AB
Nitrite: NEGATIVE
Protein, ur: 100 mg/dL — AB
RBC / HPF: >50 RBC/hpf — ABNORMAL HIGH (ref 0–5)
Specific Gravity, Urine: 1.011 (ref 1.005–1.030)
WBC, UA: >50 WBC/hpf — ABNORMAL HIGH (ref 0–5)
pH: 6 (ref 5.0–8.0)

## 2020-12-22 LAB — COMPREHENSIVE METABOLIC PANEL
ALT: 13 U/L (ref 0–44)
AST: 18 U/L (ref 15–41)
Albumin: 2.1 g/dL — ABNORMAL LOW (ref 3.5–5.0)
Alkaline Phosphatase: 47 U/L (ref 38–126)
Anion gap: 12 (ref 5–15)
BUN: 34 mg/dL — ABNORMAL HIGH (ref 8–23)
CO2: 22 mmol/L (ref 22–32)
Calcium: 8.8 mg/dL — ABNORMAL LOW (ref 8.9–10.3)
Chloride: 107 mmol/L (ref 98–111)
Creatinine, Ser: 1.65 mg/dL — ABNORMAL HIGH (ref 0.61–1.24)
GFR, Estimated: 40 mL/min — ABNORMAL LOW (ref >60)
Glucose, Bld: 112 mg/dL — ABNORMAL HIGH (ref 70–99)
Potassium: 3.8 mmol/L (ref 3.5–5.1)
Sodium: 141 mmol/L (ref 135–145)
Total Bilirubin: 1.1 mg/dL (ref 0.3–1.2)
Total Protein: 6.3 g/dL — ABNORMAL LOW (ref 6.5–8.1)

## 2020-12-22 MED ORDER — LACTATED RINGERS IV SOLN: INTRAVENOUS | Status: AC

## 2020-12-22 NOTE — Consult Note (Addendum)
Urology Consult   Physician requesting consult: Lanae Boast, MD  Reason for consult: BPH/bladder outlet obstruction  History of Present Illness: Corey Cummings is a 87 y.o. with a history of BPH and chronic urinary retention with chronic indwelling Foley catheter who was admitted on 12/10/2020 after he removed his Foley catheter and was found to have metabolic encephalopathy with agitation.  Urology was consulted after it was noted that his Foley catheter was probably draining yesterday.  Foley catheter was exchanged overnight and is now draining clear yellow urine.  He remains afebrile.  He still has agitation and is wearing mittens.  He underwent right ureteral stent placement in 11/2020 for a right UVJ stone that is now within his bladder.  He has chronic indwelling Foley catheter for bladder outlet obstruction.  Urine culture 12/14/2020 demonstrated ADK yeast.  Past Medical History:  Diagnosis Date   BPH (benign prostatic hyperplasia)    Hypertension     Past Surgical History:  Procedure Laterality Date   APPENDECTOMY     CYSTOSCOPY W/ URETERAL STENT PLACEMENT Right 12/04/2020   Procedure: CYSTOSCOPY WITH RETROGRADE PYELOGRAM/URETERAL STENT PLACEMENT;  Surgeon: Jannifer Hick, MD;  Location: [facility];  Service: Urology;  Laterality: Right;    Medications:  Home meds:  No current facility-administered medications on file prior to encounter.   Current Outpatient Medications on File Prior to Encounter  Medication Sig Dispense Refill   amLODipine (NORVASC) 5 MG tablet Take 1 tablet (5 mg total) by mouth daily. 30 tablet 1   ciprofloxacin (CIPRO) 500 MG tablet Take 1 tablet (500 mg total) by mouth 2 (two) times daily. 20 tablet 0   finasteride (PROSCAR) 5 MG tablet Take 5 mg by mouth at bedtime.     Iron-FA-B Cmp-C-Biot-Probiotic (FUSION PLUS) CAPS Take 1 capsule by mouth daily.     olmesartan (BENICAR) 20 MG tablet Take 20 mg by mouth at bedtime.     rivaroxaban (XARELTO) 20 MG TABS tablet  Take 20 mg by mouth daily with supper.     tamsulosin (FLOMAX) 0.4 MG CAPS capsule Take 0.4 mg by mouth daily.       Scheduled Meds:  acidophilus  1 capsule Oral Daily   amLODipine  5 mg Oral Daily   Chlorhexidine Gluconate Cloth  6 each Topical Daily   enoxaparin (LOVENOX) injection  60 mg Subcutaneous Q24H   finasteride  5 mg Oral QHS   fluconazole  200 mg Oral Daily   sodium chloride flush  3 mL Intravenous Q12H   tamsulosin  0.4 mg Oral Daily   Continuous Infusions:  lactated ringers 50 mL/hr at 12/22/20 1135   PRN Meds:.acetaminophen **OR** acetaminophen, hydrALAZINE, ondansetron **OR** ondansetron (ZOFRAN) IV  Allergies: No Known Allergies  History reviewed. No pertinent family history.  Social History:  reports that he has quit smoking. His smoking use included cigarettes. He has never used smokeless tobacco. He reports that he does not currently use alcohol. He reports that he does not currently use drugs.  ROS: A complete review of systems was performed.  All systems are negative except for pertinent findings as noted.  Physical Exam:  Vital signs in last 24 hours: Temp:  [97.4 F (36.3 C)-98.5 F (36.9 C)] 97.8 F (36.6 C) (09/15 1648) Pulse Rate:  [86-110] 87 (09/15 1648) Resp:  [16-18] 18 (09/15 1648) BP: (103-127)/(61-112) 117/61 (09/15 1648) SpO2:  [98 %-100 %] 99 % (09/15 1109) Constitutional:  Alert and oriented, No acute distress Cardiovascular: Regular rate and rhythm Respiratory:  Normal respiratory effort, Lungs clear bilaterally GI: Abdomen is soft, nontender, nondistended, no abdominal masses Neurologic: Grossly intact, no focal deficits Psychiatric: Normal mood and affect  Laboratory Data:  Recent Labs    12/21/20 0404 12/22/20 0512  WBC 10.6* 10.6*  HGB 9.1* 9.3*  HCT 29.3* 31.0*  PLT 253 336    Recent Labs    12/21/20 0404 12/22/20 0512  NA 137 141  K 4.0 3.8  CL 102 107  GLUCOSE 119* 112*  BUN 27* 34*  CALCIUM 8.7* 8.8*   CREATININE 1.48* 1.65*     Results for orders placed or performed during the hospital encounter of 12/14/20 (from the past 24 hour(s))  Comprehensive metabolic panel     Status: Abnormal   Collection Time: 12/22/20  5:12 AM  Result Value Ref Range   Sodium 141 135 - 145 mmol/L   Potassium 3.8 3.5 - 5.1 mmol/L   Chloride 107 98 - 111 mmol/L   CO2 22 22 - 32 mmol/L   Glucose, Bld 112 (H) 70 - 99 mg/dL   BUN 34 (H) 8 - 23 mg/dL   Creatinine, Ser 8.52 (H) 0.61 - 1.24 mg/dL   Calcium 8.8 (L) 8.9 - 10.3 mg/dL   Total Protein 6.3 (L) 6.5 - 8.1 g/dL   Albumin 2.1 (L) 3.5 - 5.0 g/dL   AST 18 15 - 41 U/L   ALT 13 0 - 44 U/L   Alkaline Phosphatase 47 38 - 126 U/L   Total Bilirubin 1.1 0.3 - 1.2 mg/dL   GFR, Estimated 40 (L) >60 mL/min   Anion gap 12 5 - 15  CBC with Differential/Platelet     Status: Abnormal   Collection Time: 12/22/20  5:12 AM  Result Value Ref Range   WBC 10.6 (H) 4.0 - 10.5 K/uL   RBC 3.64 (L) 4.22 - 5.81 MIL/uL   Hemoglobin 9.3 (L) 13.0 - 17.0 g/dL   HCT 77.8 (L) 24.2 - 35.3 %   MCV 85.2 80.0 - 100.0 fL   MCH 25.5 (L) 26.0 - 34.0 pg   MCHC 30.0 30.0 - 36.0 g/dL   RDW 61.4 (H) 43.1 - 54.0 %   Platelets 336 150 - 400 K/uL   nRBC 0.0 0.0 - 0.2 %   Neutrophils Relative % 81 %   Neutro Abs 8.6 (H) 1.7 - 7.7 K/uL   Lymphocytes Relative 13 %   Lymphs Abs 1.4 0.7 - 4.0 K/uL   Monocytes Relative 4 %   Monocytes Absolute 0.5 0.1 - 1.0 K/uL   Eosinophils Relative 1 %   Eosinophils Absolute 0.1 0.0 - 0.5 K/uL   Basophils Relative 1 %   Basophils Absolute 0.1 0.0 - 0.1 K/uL   Immature Granulocytes 0 %   Abs Immature Granulocytes 0.03 0.00 - 0.07 K/uL  Urinalysis, Routine w reflex microscopic Urine, Catheterized     Status: Abnormal   Collection Time: 12/22/20 10:52 AM  Result Value Ref Range   Color, Urine AMBER (A) YELLOW   APPearance CLOUDY (A) CLEAR   Specific Gravity, Urine 1.011 1.005 - 1.030   pH 6.0 5.0 - 8.0   Glucose, UA NEGATIVE NEGATIVE mg/dL   Hgb  urine dipstick LARGE (A) NEGATIVE   Bilirubin Urine NEGATIVE NEGATIVE   Ketones, ur 5 (A) NEGATIVE mg/dL   Protein, ur 086 (A) NEGATIVE mg/dL   Nitrite NEGATIVE NEGATIVE   Leukocytes,Ua LARGE (A) NEGATIVE   RBC / HPF >50 (H) 0 - 5 RBC/hpf   WBC, UA >  50 (H) 0 - 5 WBC/hpf   Bacteria, UA MANY (A) NONE SEEN   Squamous Epithelial / LPF 0-5 0 - 5   Mucus PRESENT    Recent Results (from the past 240 hour(s))  Urine Culture     Status: Abnormal   Collection Time: 12/14/20  6:55 AM   Specimen: Urine, Clean Catch  Result Value Ref Range Status   Specimen Description URINE, CLEAN CATCH  Final   Special Requests   Final    Normal Performed at [facility], [address] [place], [place] [address]    Culture 80,000 COLONIES/mL YEAST (A)  Final   Report Status 12/15/2020 FINAL  Final    Renal Function: Recent Labs    12/16/20 0422 12/17/20 0431 12/18/20 0434 12/19/20 1639 12/21/20 0404 12/22/20 0512  CREATININE 2.00* 1.69* 1.42* 1.55* 1.48* 1.65*   Estimated Creatinine Clearance: 26.7 mL/min (A) (by C-G formula based on SCr of 1.65 mg/dL (H)).  Radiologic Imaging: No results found.  I independently reviewed the above imaging studies.  Impression/Recommendation Bladder stone (former R UVJ stone) BPH/BOO/Urinary retention Right hydronephrosis s/p R stent 12/04/2020 Yeast UTI  -Leave Foley catheter in place.  Is currently draining clear yellow urine.  As long as patient is confused and agitated, I recommend mittens.  If catheter is found to be poorly draining, please attempt at manually irrigating or proceed with Foley catheter exchange. -He has follow-up scheduled for next week for right ureteral stent removal.  At that time, we will discuss options for either cystolitholopaxy or observation (favored) of his bladder stone. -Continue fluconazole for yeast seen in urine culture -Following peripherally, please call with questions.  Matt R. Bowden Boody MD 12/22/2020, 6:09 PM   [facility]  Pager: [phone]   CC: Lanae Boast, MD

## 2020-12-22 NOTE — Progress Notes (Signed)
PROGRESS NOTE    Corey Cummings  TSV:[id number] DOB: July 05, 1930 DOA: 12/14/2020 PCP: Corey Palmer, MD   Brief narrative 87 year old male with hypertension chronic indwelling Foley catheter, nephrolithiasis, PE on Xarelto, BPH recently hospitalized 8/8-/1931 for severe sepsis due to catheter associated urinary tract infection with hydronephrosis secondary to right UVJ calculus, had a stent placed culture grew Citrobacter friendly and discharged on complete course of ciprofloxacin.  Since discharge patient has been unsteady on his feet, was seen in the ED 9/3 after he took out his Foley catheter.  He has been acting weird confused hallucinating at times agitated, has had falls. In the ED CT brain no acute finding, afebrile, labs showed hemoglobin 8.9 g potassium 3.1 creatinine 2.3 UA large leukocyte many bacteria more than 50 RBC multiple WBC chest x-ray and pelvis x-ray no acute finding, renal CT interval placement of right double-J stent with tip coiled in the collecting system and distal ureter adjacent to the large UVJ stone which is unchanged in position. Previously seen severe right hydronephrosis has markedly improved. 2. Small locule of air remains in the patulous right collecting system which may be related to interval intervention. Persistent infection is not entirely excluded by imaging, though previously seen perinephric and periureteral stranding has significantly improved. 3. Significantly improved left hydronephrosis with only mild residual dilation of the collecting system. 4. Unchanged horseshoe morphology"  Cultures obtained, placed on antibiotics and admitted with one-to-one, IV Ativan as needed. Patient is managed IV fluids, IV antibiotics supportive care and mental status improved. Renal function improved.  Remains weak deconditioned waiting for insurance approval for skilled nursing facility.  Subjective:  Patient more confused overnight needing mitten. Catheter did not flush so it  was exchanged then urine is flowing.  He is afebrile, lab work shows stable WBC count slightly worsened BUN/creat. Mumbling words able to tell me his name.  But appears confused  Assessment & Plan:  Acute metabolic encephalopathy with agitation/hallucination, unsteady gait falls at home: CT head nonacute on admission and nonfocal.  Likely multifactorial in the setting of urine infection, could be due to ciprofloxacin use.  Mental status had improved but noted some worsening overnight in the setting of malfunctioning Foley catheter likely.  Foley catheter exchanged, urine flowing well.  Check UA. Seroquel discontinued as daughter was concerned.  Continue supportive care delirium precaution, fall precaution.  Complicated catheter associated UTI/pyelonephritis BPH Chronic indwelling Foley in place Right double-J stent placed on last admission in August Large UVJ stone unchanged in position: Candi UTI-yeast in urine on last culture had Citrobacter frunedii.  Cultures were resistant to cefazolin, ceftriaxone, and nitrofurantoin.  Urine culture with yeast, added Diflucan-completed cefepime.Will need to follow-up with urology as outpatient.  Repeat CT abdomen as above-significantly improved left hydronephrosis, markedly improved previous right severe hydronephrosis, small locule of air in patulous right collecting system may be related to interval intervention, persistent infection is not entirely excluded. Cont  Proscar finasteride .  On Diflucan, repeat UA ordered.  Given catheter issue overnight I will notify his urologist.  AKI on CKD stage IIIa Baseline creatinine on last discharge 1.3, renal function slightly worsening given his altered mental status not adequately taking p.o. start gentle IV fluid hydration.BMP in AM.  Recent Labs    12/07/20 0511 12/10/20 1957 12/14/20 0655 12/15/20 0030 12/16/20 0422 12/17/20 0431 12/18/20 0434 12/19/20 1639 12/21/20 0404 12/22/20 0512  BUN 28* 41* 39*  37* 33* 28* 22 23 27* 34*  CREATININE 1.36* 2.38* 2.32* 2.30* 2.00* 1.69* 1.42*  1.55* 1.48* 1.65*    Hypomagnesemia/Hypokalemia:Resolved Recent Labs  Lab 12/17/20 0431 12/18/20 0434 12/19/20 1639 12/21/20 0404 12/22/20 0512  K 3.5 3.8 4.0 4.0 3.8     Gait disturbance/falls at home: X-ray no acute fractures.  PT OT to cont and has recommended skilled nursing facility.    Hypertension: On amlodipine and losartan at home.  Blood pressure borderline controlled continue amlodipine.Holding ARB due to AKI- will cont to hold for now  Anemia: Stable, likely from chronic disease.  Continue to monitor. Recent Labs  Lab 12/16/20 0422 12/19/20 1639 12/21/20 0404 12/22/20 0512  HGB 9.3* 9.5* 9.1* 9.3*  HCT 29.8* 30.8* 29.3* 31.0*    History of DVT/PE on anticoagulation: Age indeterminate DVT in the left popliteal vein as well as acute PE in November/21, left leg DVT again seen on Doppler ultrasound on 12/05/2020.  Cont his DOAC  AAA, known to have dilation of thoracic aortic arch 3.3 centimeter in November/21, outpatient follow-up and surveillance will be needed  Inguinal hernia noted on CT scan with a small loops of bowel without signs of obstruction.  Outpatient follow-up with surgery.  Monitor bowel movement  Diet Order             DIET DYS 3 Room service appropriate? Yes; Fluid consistency: Thin  Diet effective now                 Patient's Body mass index is 19.89 kg/m. DVT prophylaxis: lovenox Code Status:   Code Status: Full Code  Family Communication: plan of care discussed with patient at bedside. Called daughter Corey Cummings to update- no answer- will reattempt.  Status is: Inpatient Remains inpatient appropriate because: Unsafe disposition , ongoing altered mental status. Dispo: The patient is from: Home              Anticipated d/c is to: SNF.              Patient currently is NOT medically stable for discharge   Difficult to place patient No Unresulted Labs (From  admission, onward)     Start     Ordered   12/22/20 1052  Urinalysis, Routine w reflex microscopic Urine, Catheterized  Once,   R        12/22/20 1053   12/22/20 1052  Urine Culture  ONCE - STAT,   STAT       Question:  Indication  Answer:  Altered mental status (if no other cause identified)   12/22/20 1053            Medications reviewed:  Scheduled Meds:  acidophilus  1 capsule Oral Daily   amLODipine  5 mg Oral Daily   Chlorhexidine Gluconate Cloth  6 each Topical Daily   enoxaparin (LOVENOX) injection  60 mg Subcutaneous Q24H   finasteride  5 mg Oral QHS   fluconazole  200 mg Oral Daily   sodium chloride flush  3 mL Intravenous Q12H   tamsulosin  0.4 mg Oral Daily   Continuous Infusions:   Consultants:see note  Procedures:see note Antimicrobials: Anti-infectives (From admission, onward)    Start     Dose/Rate Route Frequency Ordered Stop   12/20/20 0000  fluconazole (DIFLUCAN) 200 MG tablet        200 mg Oral Daily 12/19/20 1444     12/16/20 1130  fluconazole (DIFLUCAN) tablet 200 mg        200 mg Oral Daily 12/16/20 1041 12/30/20 0959   12/14/20 1045  ceFEPIme (MAXIPIME) 2 g  in sodium chloride 0.9 % 100 mL IVPB        2 g 200 mL/hr over 30 Minutes Intravenous Every 24 hours 12/14/20 1043 12/18/20 1151   12/14/20 0800  cefTRIAXone (ROCEPHIN) 1 g in sodium chloride 0.9 % 100 mL IVPB        1 g 200 mL/hr over 30 Minutes Intravenous  Once 12/14/20 0751 12/14/20 0953      Culture/Microbiology    Component Value Date/Time   SDES URINE, CLEAN CATCH 12/14/2020 0655   SPECREQUEST  12/14/2020 0655    Normal Performed at [facility], [address] [place], [place] [address]    CULT 80,000 COLONIES/mL YEAST (A) 12/14/2020 0655   REPTSTATUS 12/15/2020 FINAL 12/14/2020 0655    Other culture-see note  Objective: Vitals: Today's Vitals   12/21/20 0858 12/21/20 1817 12/21/20 2000 12/22/20 0339  BP: 123/83 121/87 (!) 127/112 126/79  Pulse: 89 (!) 105 (!)  110 93  Resp: 16 18 18 16   Temp: 97.7 F (36.5 C) (!) 97.4 F (36.3 C) 98.5 F (36.9 C)   TempSrc: Oral Axillary Axillary   SpO2: 99% 98% 99% 100%  Weight:      PainSc:        Intake/Output Summary (Last 24 hours) at 12/22/2020 1053 Last data filed at 12/22/2020 0500 Gross per 24 hour  Intake 322 ml  Output 650 ml  Net -328 ml    Filed Weights   12/15/20 2219  Weight: 61.1 kg   Weight change:   Intake/Output from previous day: 09/14 0701 - 09/15 0700 In: 542 [P.O.:517] Out: 650 [Urine:650] Intake/Output this shift: No intake/output data recorded. Filed Weights   12/15/20 2219  Weight: 61.1 kg   Examination: General exam: AAOx self, date of birth, current place.  Confused, HEENT:Oral mucosa moist, Ear/Nose WNL grossly, dentition normal. Respiratory system: bilaterally clear, no added sounds, no use of accessory muscle Cardiovascular system: S1 & S2 +, No JVD,. Gastrointestinal system: Abdomen soft, NT,ND, BS+ Nervous System:Alert, awake, moving extremities and grossly nonfocal Extremities:  no edema, distal peripheral pulses palpable.  Skin: No rashes,no icterus. MSK: Normal muscle bulk,tone, power   Data Reviewed: I have personally reviewed following labs and imaging studies CBC: Recent Labs  Lab 12/16/20 0422 12/19/20 1639 12/21/20 0404 12/22/20 0512  WBC 8.7 10.4 10.6* 10.6*  NEUTROABS  --   --   --  8.6*  HGB 9.3* 9.5* 9.1* 9.3*  HCT 29.8* 30.8* 29.3* 31.0*  MCV 82.8 83.2 83.2 85.2  PLT 250 223 253 336    Basic Metabolic Panel: Recent Labs  Lab 12/16/20 0422 12/17/20 0431 12/18/20 0434 12/19/20 1639 12/21/20 0404 12/22/20 0512  NA 140 138 137 136 137 141  K 3.5 3.5 3.8 4.0 4.0 3.8  CL 108 109 108 104 102 107  CO2 22 21* 21* 25 23 22   GLUCOSE 78 83 94 140* 119* 112*  BUN 33* 28* 22 23 27* 34*  CREATININE 2.00* 1.69* 1.42* 1.55* 1.48* 1.65*  CALCIUM 8.6* 8.3* 8.1* 8.4* 8.7* 8.8*  MG 1.9  --   --   --   --   --     GFR: Estimated  Creatinine Clearance: 26.7 mL/min (A) (by C-G formula based on SCr of 1.65 mg/dL (H)). Liver Function Tests: Recent Labs  Lab 12/16/20 0422 12/22/20 0512  AST 16 18  ALT 13 13  ALKPHOS 39 47  BILITOT 1.1 1.1  PROT 5.6* 6.3*  ALBUMIN 2.1* 2.1*  No results for input(s): LIPASE, AMYLASE in the last 168 hours. No results for input(s): AMMONIA in the last 168 hours. Coagulation Profile: No results for input(s): INR, PROTIME in the last 168 hours. Cardiac Enzymes: No results for input(s): CKTOTAL, CKMB, CKMBINDEX, TROPONINI in the last 168 hours.  BNP (last 3 results) No results for input(s): PROBNP in the last 8760 hours. HbA1C: No results for input(s): HGBA1C in the last 72 hours. CBG: No results for input(s): GLUCAP in the last 168 hours.  Lipid Profile: No results for input(s): CHOL, HDL, LDLCALC, TRIG, CHOLHDL, LDLDIRECT in the last 72 hours. Thyroid Function Tests: No results for input(s): TSH, T4TOTAL, FREET4, T3FREE, THYROIDAB in the last 72 hours. Anemia Panel: No results for input(s): VITAMINB12, FOLATE, FERRITIN, TIBC, IRON, RETICCTPCT in the last 72 hours. Sepsis Labs: No results for input(s): PROCALCITON, LATICACIDVEN in the last 168 hours.  Recent Results (from the past 240 hour(s))  Urine Culture     Status: Abnormal   Collection Time: 12/14/20  6:55 AM   Specimen: Urine, Clean Catch  Result Value Ref Range Status   Specimen Description URINE, CLEAN CATCH  Final   Special Requests   Final    Normal Performed at [facility], [address] [place], [place] [address]    Culture 80,000 COLONIES/mL YEAST (A)  Final   Report Status 12/15/2020 FINAL  Final      Radiology Studies: No results found.   LOS: 8 days   Lanae Boast, MD [facility]  12/22/2020, 10:53 AM

## 2020-12-22 NOTE — Progress Notes (Signed)
Called Debbie daughter twice today kept getting voicemail

## 2020-12-22 NOTE — Progress Notes (Signed)
Occupational Therapy Treatment Patient Details Name: Corey Cummings MRN: [id number] DOB: February 14, 1931 Today's Date: 12/22/2020   History of present illness 87 y.o. male presents to [facility] ED on 12/14/2020 with AMS. Pt recently hospitalized 8/28-8/31 with sepsis 2/2 UTI. Pt admitted for acute metabolic encephalopathy and UTI management.PMH includes HTN, chronic indwelling Foley catheter, nephrolithiasis, ED/PE on Xarelto, and BPH.   OT comments  Pt with significant confusion, restlessness and seems to be hallucinating. Speech is nonsensical. Mod to max assist needed for bed mobility. Pt with poor sitting balance at EOB with R side and posterior lean in both sitting and standing. Pt wiping face with moderate assistance for thoroughness, he is otherwise dependent. RN is aware of change in cognition.    Recommendations for follow up therapy are one component of a multi-disciplinary discharge planning process, led by the attending physician.  Recommendations may be updated based on patient status, additional functional criteria and insurance authorization.    Follow Up Recommendations  SNF;Supervision/Assistance - 24 hour    Equipment Recommendations  Other (comment) (defer to next venue)    Recommendations for Other Services      Precautions / Restrictions Precautions Precautions: Fall Precaution Comments: strong R sided lean in sitting and standing       Mobility Bed Mobility Overal bed mobility: Needs Assistance Bed Mobility: Supine to Sit;Sit to Supine     Supine to sit: Mod assist Sit to supine: Max assist   General bed mobility comments: decreased initiation, guided LEs over EOB and pt pulled up on therapist's hands, resistant to return to supine so he could hold the phone and speak to his daughter, unable to balance at EOB and use UEs effectively    Transfers Overall transfer level: Needs assistance Equipment used: 1 person hand held assist Transfers: Sit to/from Stand Sit to Stand:  Mod assist         General transfer comment: assist to rise and steady, pt with R side and posterior lean, seemingly without awareness    Balance Overall balance assessment: Needs assistance Sitting-balance support: Bilateral upper extremity supported Sitting balance-Leahy Scale: Poor Sitting balance - Comments: R side and posterior lean     Standing balance-Leahy Scale: Poor Standing balance comment: moderate R side lean                           ADL either performed or assessed with clinical judgement   ADL Overall ADL's : Needs assistance/impaired     Grooming: Wash/dry face;Bed level;Moderate assistance           Upper Body Dressing : Total assistance;Bed level                           Vision       Perception     Praxis      Cognition Arousal/Alertness: Awake/alert Behavior During Therapy: Impulsive;Restless Overall Cognitive Status: Impaired/Different from baseline Area of Impairment: Orientation;Attention;Memory;Following commands;Safety/judgement;Awareness;Problem solving                 Orientation Level: Disoriented to;Place;Time;Situation Current Attention Level: Focused Memory: Decreased short-term memory;Decreased recall of precautions Following Commands: Follows one step commands inconsistently (with multimodal cues) Safety/Judgement: Decreased awareness of deficits;Decreased awareness of safety Awareness: Intellectual Problem Solving: Slow processing;Decreased initiation;Difficulty sequencing;Requires verbal cues;Requires tactile cues General Comments: pt appears to be hallucinating, did not recognize daughter's voice on phone        Exercises  Shoulder Instructions       General Comments      Pertinent Vitals/ Pain       Pain Assessment: Faces Faces Pain Scale: No hurt  Home Living                                          Prior Functioning/Environment               Frequency  Min 2X/week        Progress Toward Goals  OT Goals(current goals can now be found in the care plan section)  Progress towards OT goals: Not progressing toward goals - comment (pt with increased confusion)  Acute Rehab OT Goals Patient Stated Goal: did not state OT Goal Formulation: With patient Time For Goal Achievement: 12/29/20 Potential to Achieve Goals: Fair  Plan Discharge plan remains appropriate;Frequency remains appropriate    Co-evaluation                 AM-PAC OT "6 Clicks" Daily Activity     Outcome Measure   Help from another person eating meals?: Total Help from another person taking care of personal grooming?: A Lot Help from another person toileting, which includes using toliet, bedpan, or urinal?: Total Help from another person bathing (including washing, rinsing, drying)?: Total Help from another person to put on and taking off regular upper body clothing?: Total   6 Click Score: 6    End of Session Equipment Utilized During Treatment: Gait belt;Oxygen  OT Visit Diagnosis: Unsteadiness on feet (R26.81);Other abnormalities of gait and mobility (R26.89);Muscle weakness (generalized) (M62.81);Other symptoms and signs involving cognitive function   Activity Tolerance Patient tolerated treatment well   Patient Left in bed;with call bell/phone within reach;with bed alarm set;with restraints reapplied   Nurse Communication Other (comment) (RN aware of pt's significant decline in cognition and mobility)        Time: 8185-6314 OT Time Calculation (min): 22 min  Charges: OT General Charges $OT Visit: 1 Visit OT Treatments $Self Care/Home Management : 8-22 mins  Martie Round, OTR/L Acute Rehabilitation Services Pager: [phone] Office: [phone]   Evern Bio 12/22/2020, 11:09 AM

## 2020-12-22 NOTE — Progress Notes (Signed)
Patient continued to be confused overnight.  Oriented to person only, not following commands and unable to safely give HS medications.  Additionally, noticed only scant urine output in foley bag so far this shift.  Bladder scanned for of urine.   MD notified and writer attempted to flush catheter with sterile saline.   Unable to get urine return or saline return.  Further spoke with MD and replaced foley catheter.   Got urine return and appears to be free flowing now.  Lastly, expressed concern to MD that patient's mentation is much worse vs this weekend.  MD placed new orders for stat labs.   VS have been stable so far this shift and no fever.

## 2020-12-23 DIAGNOSIS — G9341 Metabolic encephalopathy: Secondary | ICD-10-CM | POA: Diagnosis not present

## 2020-12-23 LAB — URINE CULTURE: Culture: NO GROWTH

## 2020-12-23 LAB — BASIC METABOLIC PANEL
Anion gap: 18 — ABNORMAL HIGH (ref 5–15)
BUN: 35 mg/dL — ABNORMAL HIGH (ref 8–23)
CO2: 21 mmol/L — ABNORMAL LOW (ref 22–32)
Calcium: 9.3 mg/dL (ref 8.9–10.3)
Chloride: 104 mmol/L (ref 98–111)
Creatinine, Ser: 1.48 mg/dL — ABNORMAL HIGH (ref 0.61–1.24)
GFR, Estimated: 45 mL/min — ABNORMAL LOW (ref >60)
Glucose, Bld: 88 mg/dL (ref 70–99)
Potassium: 3.8 mmol/L (ref 3.5–5.1)
Sodium: 143 mmol/L (ref 135–145)

## 2020-12-23 MED ORDER — QUETIAPINE FUMARATE 25 MG PO TABS
12.5000 mg | ORAL_TABLET | Freq: Every day | ORAL | Status: DC
  Administered 2020-12-24 – 2020-12-27 (×4): 12.5 mg via ORAL
  Filled 2020-12-23 (×5): qty 1

## 2020-12-23 NOTE — Progress Notes (Signed)
PROGRESS NOTE    Corey Cummings  JKK:[id number] DOB: [birth year] DOA: 12/14/2020 PCP: [name], MD   Brief narrative 87 year old male with hypertension chronic indwelling Foley catheter, nephrolithiasis, PE on Xarelto, BPH recently hospitalized 8/8-/1931 for severe sepsis due to catheter associated urinary tract infection with hydronephrosis secondary to right UVJ calculus, had a stent placed culture grew Citrobacter friendly and discharged on complete course of ciprofloxacin.  Since discharge patient has been unsteady on his feet, was seen in the ED 9/3 after he took out his Foley catheter.  He has been acting weird confused hallucinating at times agitated, has had falls. In the ED CT brain no acute finding, afebrile, labs showed hemoglobin 8.9 g potassium 3.1 creatinine 2.3 UA large leukocyte many bacteria more than 50 RBC multiple WBC chest x-ray and pelvis x-ray no acute finding, renal CT interval placement of right double-J stent with tip coiled in the collecting system and distal ureter adjacent to the large UVJ stone which is unchanged in position. Previously seen severe right hydronephrosis has markedly improved. 2. Small locule of air remains in the patulous right collecting system which may be related to interval intervention. Persistent infection is not entirely excluded by imaging, though previously seen perinephric and periureteral stranding has significantly improved. 3. Significantly improved left hydronephrosis with only mild residual dilation of the collecting system. 4. Unchanged horseshoe morphology"  Cultures obtained, placed on antibiotics and admitted with one-to-one, IV Ativan as needed. Patient is managed IV fluids, IV antibiotics supportive care and mental status improved. Renal function improved.  Remains weak deconditioned waiting for insurance approval for skilled nursing facility. Patient was found to be more confused and also retaining urine Foley catheter exchanged 9/14  night  Subjective: Seen this morning able to speak some words knows current president's name date of birth unable to tell me current location [place] in place.  Assessment & Plan:  Acute metabolic encephalopathy with agitation/hallucination, unsteady gait falls at home: CT head nonacute on admission and nonfocal.  Likely multifactorial in the setting of UTI, could be due to ciprofloxacin use.  Mental status had improved but noted some worsening overnight 9/14 in the setting of malfunctioning Foley catheter- foley catheter exchanged, seen by urology DrGay, UA w/ pyuria likely from the stent, urine culture negative from 9/15. Seroquel was discontinued as daughter was concerned.  Will likely benefit with restarting bedtime.Continue supportive care delirium precaution, fall precaution.  Complicated catheter associated UTI/pyelonephritis BPH Chronic indwelling Foley in place Right double-J stent placed on last admission in August Bladder stone (formed R UVJ stone) Candida UTI-yeast in urine Urine culture with yeast, cont on Diflucan. S/p completed cefepime.repeat urine culture 9/15 negative.   CT abdomen as above-significantly improved left hydronephrosis, markedly improved previous right severe hydronephrosis, small locule of air in patulous right collecting system may be related to interval intervention, persistent infection is not entirely excluded.  Seen by Dr. Nedra Hai from urology this admission -advised to follow-up with urology next week to remove the right ureteral stent, continue catheter in place continue proscar finasteride and complete Diflucan through 12/29/20.  AKI on CKD stage IIIa Baseline creatinine on last discharge 1.3, -currently at baseline and gentle IV fluid hydration, encourage oral intake Recent Labs    12/10/20 1957 12/14/20 0655 12/15/20 0030 12/16/20 0422 12/17/20 0431 12/18/20 0434 12/19/20 1639 12/21/20 0404 12/22/20 0512 12/23/20 0357  BUN 41* 39* 37* 33* 28* 22 23  27* 34* 35*  CREATININE 2.38* 2.32* 2.30* 2.00* 1.69* 1.42* 1.55* 1.48* 1.65* 1.48*  Hypomagnesemia/Hypokalemia:Resolved Recent Labs  Lab 12/18/20 0434 12/19/20 1639 12/21/20 0404 12/22/20 0512 12/23/20 0357  K 3.8 4.0 4.0 3.8 3.8     Gait disturbance/falls at home: X-ray no acute fractures.  PT OT to cont and has recommended skilled nursing facility.    Hypertension: Stable on amlodipine losartan remains on hold   Anemia: likely from chronic disease.  Stable.Continue to monitor. Recent Labs  Lab 12/19/20 1639 12/21/20 0404 12/22/20 0512  HGB 9.5* 9.1* 9.3*  HCT 30.8* 29.3* 31.0*    History of DVT/PE on anticoagulation: Age indeterminate DVT in the left popliteal vein as well as acute PE in November 21, left leg DVT again seen on Doppler ultrasound on 12/05/2020.  On lovenox here-resume eliquis on d.c  AAA, known to have dilation of thoracic aortic arch 3.3 centimeter in November/21, outpatient follow-up and surveillance will be needed  Inguinal hernia noted on CT scan with a small loops of bowel without signs of obstruction.  Outpatient follow-up with surgery.  Monitor bowel movement  Diet Order             DIET DYS 3 Room service appropriate? Yes; Fluid consistency: Thin  Diet effective now                 Patient's Body mass index is 19.89 kg/m. DVT prophylaxis: lovenox Code Status:   Code Status: Full Code  Family Communication: plan of care discussed with patient at bedside. Called daughter [name] to update no answer again today. Left message.  Status is: Inpatient Remains inpatient appropriate because: Unsafe disposition , ongoing altered mental status. Dispo: The patient is from: Home              Anticipated d/c is to: SNF once mental status clears.              Patient currently is NOT medically stable for discharge   Difficult to place patient No Unresulted Labs (From admission, onward)    None       Medications reviewed:  Scheduled Meds:   acidophilus  1 capsule Oral Daily   amLODipine  5 mg Oral Daily   Chlorhexidine Gluconate Cloth  6 each Topical Daily   enoxaparin (LOVENOX) injection  60 mg Subcutaneous Q24H   finasteride  5 mg Oral QHS   fluconazole  200 mg Oral Daily   sodium chloride flush  3 mL Intravenous Q12H   tamsulosin  0.4 mg Oral Daily   Continuous Infusions:  lactated ringers 50 mL/hr at 12/22/20 2353    Consultants:see note  Procedures:see note Antimicrobials: Anti-infectives (From admission, onward)    Start     Dose/Rate Route Frequency Ordered Stop   12/20/20 0000  fluconazole (DIFLUCAN) 200 MG tablet        200 mg Oral Daily 12/19/20 1444     12/16/20 1130  fluconazole (DIFLUCAN) tablet 200 mg        200 mg Oral Daily 12/16/20 1041 12/30/20 0959   12/14/20 1045  ceFEPIme (MAXIPIME) 2 g in sodium chloride 0.9 % 100 mL IVPB        2 g 200 mL/hr over 30 Minutes Intravenous Every 24 hours 12/14/20 1043 12/18/20 1151   12/14/20 0800  cefTRIAXone (ROCEPHIN) 1 g in sodium chloride 0.9 % 100 mL IVPB        1 g 200 mL/hr over 30 Minutes Intravenous  Once 12/14/20 0751 12/14/20 0953      Culture/Microbiology    Component Value Date/Time  SDES URINE, CATHETERIZED 12/22/2020 1052   SPECREQUEST NONE 12/22/2020 1052   CULT  12/22/2020 1052    NO GROWTH Performed at [facility], [address] [place], [place] [address]    REPTSTATUS 12/23/2020 FINAL 12/22/2020 1052    Other culture-see note  Objective: Vitals: Today's Vitals   12/22/20 2008 12/23/20 0429 12/23/20 0946 12/23/20 1100  BP: (!) 118/106 137/76 132/72   Pulse: 87 86 93   Resp: 14 18 20    Temp: 97.8 F (36.6 C) 97.6 F (36.4 C) 97.7 F (36.5 C)   TempSrc: Axillary Oral Oral   SpO2: 97% 94% 100%   Weight:      PainSc: 0-No pain   0-No pain    Intake/Output Summary (Last 24 hours) at 12/23/2020 1113 Last data filed at 12/23/2020 0840 Gross per 24 hour  Intake 639.79 ml  Output 300 ml  Net 339.79 ml    Filed  Weights   12/15/20 2219  Weight: 61.1 kg   Weight change:   Intake/Output from previous day: 09/15 0701 - 09/16 0700 In: 759.8 [P.O.:180; I.V.:579.8] Out: 300 [Urine:300] Intake/Output this shift: No intake/output data recorded. Filed Weights   12/15/20 2219  Weight: 61.1 kg   Examination: General exam: AAOx 2, elderly, frail weak.  HEENT:Oral mucosa moist, Ear/Nose WNL grossly, dentition normal. Respiratory system: bilaterally clear breath sounds, no use of accessory muscle. Cardiovascular system: S1 & S2 +, No JVD. Gastrointestinal system: Abdomen soft,NT,ND, BS+. Nervous System:Alert, awake, moving extremities and grossly non-focal. Extremities: No edema, distal peripheral pulses palpable.  Skin: No rashes/No icterus. MSK: Normal muscle bulk,tone, power.   Data Reviewed: I have personally reviewed following labs and imaging studies CBC: Recent Labs  Lab 12/19/20 1639 12/21/20 0404 12/22/20 0512  WBC 10.4 10.6* 10.6*  NEUTROABS  --   --  8.6*  HGB 9.5* 9.1* 9.3*  HCT 30.8* 29.3* 31.0*  MCV 83.2 83.2 85.2  PLT 223 253 336    Basic Metabolic Panel: Recent Labs  Lab 12/18/20 0434 12/19/20 1639 12/21/20 0404 12/22/20 0512 12/23/20 0357  NA 137 136 137 141 143  K 3.8 4.0 4.0 3.8 3.8  CL 108 104 102 107 104  CO2 21* 25 23 22  21*  GLUCOSE 94 140* 119* 112* 88  BUN 22 23 27* 34* 35*  CREATININE 1.42* 1.55* 1.48* 1.65* 1.48*  CALCIUM 8.1* 8.4* 8.7* 8.8* 9.3    GFR: Estimated Creatinine Clearance: 29.8 mL/min (A) (by C-G formula based on SCr of 1.48 mg/dL (H)). Liver Function Tests: Recent Labs  Lab 12/22/20 0512  AST 18  ALT 13  ALKPHOS 47  BILITOT 1.1  PROT 6.3*  ALBUMIN 2.1*    No results for input(s): LIPASE, AMYLASE in the last 168 hours. No results for input(s): AMMONIA in the last 168 hours. Coagulation Profile: No results for input(s): INR, PROTIME in the last 168 hours. Cardiac Enzymes: No results for input(s): CKTOTAL, CKMB, CKMBINDEX,  TROPONINI in the last 168 hours.  BNP (last 3 results) No results for input(s): PROBNP in the last 8760 hours. HbA1C: No results for input(s): HGBA1C in the last 72 hours. CBG: No results for input(s): GLUCAP in the last 168 hours.  Lipid Profile: No results for input(s): CHOL, HDL, LDLCALC, TRIG, CHOLHDL, LDLDIRECT in the last 72 hours. Thyroid Function Tests: No results for input(s): TSH, T4TOTAL, FREET4, T3FREE, THYROIDAB in the last 72 hours. Anemia Panel: No results for input(s): VITAMINB12, FOLATE, FERRITIN, TIBC, IRON, RETICCTPCT in the last 72  hours. Sepsis Labs: No results for input(s): PROCALCITON, LATICACIDVEN in the last 168 hours.  Recent Results (from the past 240 hour(s))  Urine Culture     Status: Abnormal   Collection Time: 12/14/20  6:55 AM   Specimen: Urine, Clean Catch  Result Value Ref Range Status   Specimen Description URINE, CLEAN CATCH  Final   Special Requests   Final    Normal Performed at [facility], [address], [place], [place] [address]    Culture 80,000 COLONIES/mL YEAST (A)  Final   Report Status 12/15/2020 FINAL  Final  Urine Culture     Status: None   Collection Time: 12/22/20 10:52 AM   Specimen: Urine, Catheterized  Result Value Ref Range Status   Specimen Description URINE, CATHETERIZED  Final   Special Requests NONE  Final   Culture   Final    NO GROWTH Performed at [facility], [address], [place], [place] [address]    Report Status 12/23/2020 FINAL  Final      Radiology Studies: No results found.   LOS: 9 days   Lanae Boast, MD [facility]  12/23/2020, 11:13 AM

## 2020-12-23 NOTE — TOC Progression Note (Signed)
Transition of Care [facility]) - Progression Note    Patient Details  Name: Markee Matera MRN: [id number] Date of Birth: 12-Nov-1930  [facility]) CM/SW Contact  Okey Dupre Lazaro Arms, LCSW Phone Number: 12/23/2020, 12:34 PM  Clinical Narrative:   Received call from Rosey Bath, admissions director at [facility] requesting additional clinicals to be sent to [facility]. Patient will discharge to [facility] once insurance auth received.    Expected Discharge Plan: Skilled Nursing Facility Barriers to Discharge: Continued Medical Work up  Expected Discharge Plan and Services Expected Discharge Plan: Skilled Nursing Facility In-house Referral: Clinical Social Work   Post Acute Care Choice: Skilled Nursing Facility Living arrangements for the past 2 months: Single Family Home (Patient has his own home, but was staying with step- daughter [name] before being admitted to hospital) Expected Discharge Date: 12/19/20                                     Social Determinants of Health (SDOH) Interventions  No SDOH interventions requested or needed at discharge.   Readmission Risk Interventions No flowsheet data found.

## 2020-12-24 ENCOUNTER — Inpatient Hospital Stay (HOSPITAL_COMMUNITY): Payer: Medicare HMO

## 2020-12-24 DIAGNOSIS — L8990 Pressure ulcer of unspecified site, unspecified stage: Secondary | ICD-10-CM | POA: Insufficient documentation

## 2020-12-24 DIAGNOSIS — G9341 Metabolic encephalopathy: Secondary | ICD-10-CM | POA: Diagnosis not present

## 2020-12-24 NOTE — Progress Notes (Signed)
During the night, the patient was combative at times.  Even with the safety mitts on, he resisted being turned and did not want telemetry leads to be put back on.  He was reaching towards the ceiling as if something was there.  When I attempted to give his HS medications crushed in applesauce, he spit them out and refused to take another bite.  Will continue to monitor patient.  Bernie Covey RN

## 2020-12-24 NOTE — Progress Notes (Addendum)
PROGRESS NOTE    Corey Cummings  GMW:[id number] DOB: Sep 25, 1930 DOA: 12/14/2020 PCP: [name], MD   Brief narrative:  87 year old male with hypertension chronic indwelling Foley catheter, nephrolithiasis, PE on Xarelto, BPH recently hospitalized 8/28-8/31 for severe sepsis due to catheter associated urinary tract infection with hydronephrosis secondary to right UVJ calculus, had a stent placed culture grew Citrobacter friendly and discharged on complete course of ciprofloxacin.  Since discharge patient has been unsteady on his feet, was seen in the ED 9/3 after he took out his Foley catheter.  He has been acting weird confused hallucinating at times agitated, has had falls onset on Labor day per daughter.   In the ED CT brain no acute finding, afebrile, labs showed hemoglobin 8.9 g potassium 3.1 creatinine 2.3 UA large leukocyte many bacteria more than 50 RBC multiple WBC chest x-ray and pelvis x-ray no acute finding, renal CT interval placement of right double-J stent with tip coiled in the collecting system and distal ureter adjacent to the large UVJ stone which is unchanged in position. Previously seen severe right hydronephrosis has markedly improved. 2. Small locule of air remains in the patulous right collecting system which may be related to interval intervention. Persistent infection is not entirely excluded by imaging, though previously seen perinephric and periureteral stranding has significantly improved. 3. Significantly improved left hydronephrosis with only mild residual dilation of the collecting system. 4. Unchanged horseshoe morphology"   Cultures obtained, placed on antibiotics and admitted with one-to-one, IV Ativan pnr.  Ciprofloxacin was discontinued as it could have contributed to his confusion/hallucination.  Patient managed IV fluids, IV antibiotics supportive care and mental status improved relatively.Renal function improved but was weak deconditioned - was waiting for  insurance approval for skilled nursing facility.  But he was found to be more confused and also retaining urine foley catheter exchanged 12/21/20 night Seen by urology advised outpatient follow-up.  Patient completed cefepime, and on Diflucan.  Subjective:  Seen this morning.  Remains confused.  He was able to tell current location current president and 2022 as here.Overnight confused split medication,did not take Seroquel  Assessment & Plan:  Acute metabolic encephalopathy with agitation/hallucination, unsteady gait falls at home: CT head nonacute on admission and nonfocal.  Likely multifactorial in the setting of UTI, could be due to ciprofloxacin induced.  Mental status relatively better but again was worsening overnight 9/14 in the setting of malfunctioning Foley catheter- foley catheter exchanged, seen by urology DrGay, UA w/ pyuria likely from the stent, urine culture negative from 9/15.  Patient did not take Seroquel last night.  This morning mentation not back to baseline although confused.  Will obtain MRI brain to further evaluate.Continue supportive care delirium precaution, fall precaution.  Complicated catheter associated UTI/pyelonephritis Chronic indwelling Foley in place w/ BPH Right double-J stent placed on last admission in August Bladder stone (formed R UVJ stone) Candida UTI-yeast in urine Urine culture with yeast.S/p completed cefepime.repeat urine culture 9/15 negative.   CT abdomen as above-significantly improved left hydronephrosis, markedly improved previous right severe hydronephrosis, small locule of air in patulous right collecting system may be related to interval intervention, persistent infection is not entirely excluded.  Seen by Dr. Nedra Hai from urology this admission -advised to follow-up with urology next week to remove the right ureteral stent, continue catheter in place continue proscar finasteride and complete Diflucan through 12/29/20.  AKI on CKD stage IIIa  Baseline creatinine on last discharge 1.3, close to baseline.  On IV fluid hydration gently Recent  Labs    12/10/20 1957 12/14/20 0655 12/15/20 0030 12/16/20 0422 12/17/20 0431 12/18/20 0434 12/19/20 1639 12/21/20 0404 12/22/20 0512 12/23/20 0357  BUN 41* 39* 37* 33* 28* 22 23 27* 34* 35*  CREATININE 2.38* 2.32* 2.30* 2.00* 1.69* 1.42* 1.55* 1.48* 1.65* 1.48*    Hypomagnesemia/Hypokalemia:Resolved   Gait disturbance/falls at home Physical deconditioning:  X-ray no acute fractures. Cont PTOT.  Hypertension: Stable on amlodipine losartan remains on hold-will not resume on discharge.  Anemia: likely from chronic disease.  Stable.Continue to monitor. Recent Labs  Lab 12/19/20 1639 12/21/20 0404 12/22/20 0512  HGB 9.5* 9.1* 9.3*  HCT 30.8* 29.3* 31.0*    History of DVT/PE on anticoagulation: Age indeterminate DVT in the left popliteal vein as well as acute PE in November 21, left leg DVT again seen on Doppler ultrasound on 12/05/2020.  On lovenox here-resume eliquis on d.c  AAA, known to have dilation of thoracic aortic arch 3.3 centimeter in November/21, outpatient follow-up and surveillance will be needed  Inguinal hernia noted on CT scan with a small loops of bowel without signs of obstruction.  Outpatient follow-up with surgery.  Monitor bowel movement  Diet Order             DIET DYS 3 Room service appropriate? Yes; Fluid consistency: Thin  Diet effective now                 Patient's Body mass index is 18.23 kg/m. DVT prophylaxis: lovenox Code Status:   Code Status: Full Code  Family Communication: plan of care discussed with patient at bedside.  Daughter was updated extensively 9/16-if unable to reach status he has requested to call  sister in law. 9/17-Called daughter but picked up by sister, updated. More sleepy today- Mri pending- they would like to know the result.  Status TM:[id number]  Remains inpatient appropriate because: Unsafe disposition ,  ongoing altered mental status. Dispo:The patient is from: Home            Anticipated d/c is to:SNF once mental status improves further.              Patient currently is NOT medically stable for discharge.            Difficult to place patient No Unresulted Labs (From admission, onward)    None       Medications reviewed:  Scheduled Meds:  acidophilus  1 capsule Oral Daily   amLODipine  5 mg Oral Daily   Chlorhexidine Gluconate Cloth  6 each Topical Daily   enoxaparin (LOVENOX) injection  60 mg Subcutaneous Q24H   finasteride  5 mg Oral QHS   fluconazole  200 mg Oral Daily   QUEtiapine  12.5 mg Oral QHS   sodium chloride flush  3 mL Intravenous Q12H   tamsulosin  0.4 mg Oral Daily   Continuous Infusions:    Consultants:see note  Procedures:see note Antimicrobials: Anti-infectives (From admission, onward)    Start     Dose/Rate Route Frequency Ordered Stop   12/20/20 0000  fluconazole (DIFLUCAN) 200 MG tablet        200 mg Oral Daily 12/19/20 1444     12/16/20 1130  fluconazole (DIFLUCAN) tablet 200 mg        200 mg Oral Daily 12/16/20 1041 12/30/20 0959   12/14/20 1045  ceFEPIme (MAXIPIME) 2 g in sodium chloride 0.9 % 100 mL IVPB        2 g 200 mL/hr over 30 Minutes Intravenous Every 24  hours 12/14/20 1043 12/18/20 1151   12/14/20 0800  cefTRIAXone (ROCEPHIN) 1 g in sodium chloride 0.9 % 100 mL IVPB        1 g 200 mL/hr over 30 Minutes Intravenous  Once 12/14/20 0751 12/14/20 0953      Culture/Microbiology    Component Value Date/Time   SDES URINE, CATHETERIZED 12/22/2020 1052   SPECREQUEST NONE 12/22/2020 1052   CULT  12/22/2020 1052    NO GROWTH Performed at [facility], [address], [place], [place] [address]    REPTSTATUS 12/23/2020 FINAL 12/22/2020 1052    Other culture-see note  Objective: Vitals: Today's Vitals   12/23/20 2020 12/23/20 2035 12/23/20 2045 12/24/20 0636  BP:   134/80 115/90  Pulse:   91 81  Resp:   (!) 22 20  Temp:    98 F (36.7 C) 97.6 F (36.4 C)  TempSrc:   Oral Oral  SpO2:   90% 92%  Weight:  56 kg    PainSc: 0-No pain  0-No pain     Intake/Output Summary (Last 24 hours) at 12/24/2020 1001 Last data filed at 12/24/2020 0600 Gross per 24 hour  Intake 0 ml  Output 600 ml  Net -600 ml    Filed Weights   12/15/20 2219 12/23/20 2035  Weight: 61.1 kg 56 kg   Weight change:   Intake/Output from previous day: 09/16 0701 - 09/17 0700 In: 0  Out: 600 [Urine:600] Intake/Output this shift: No intake/output data recorded. Filed Weights   12/15/20 2219 12/23/20 2035  Weight: 61.1 kg 56 kg   Examination: General exam: AAO, mildly confused, elderly, frail older than stated age, weak appearing. HEENT:Oral mucosa moist, Ear/Nose WNL grossly, dentition normal. Respiratory system: bilaterally clear breath sounds no use of accessory muscle Cardiovascular system: S1 & S2 +, No JVD,. Gastrointestinal system: Abdomen soft,NT,ND, BS+ Nervous System:Alert, awake, moving extremities and grossly nonfocal Extremities: No edema, distal peripheral pulses palpable.  Skin: No rashes,no icterus. MSK: Normal muscle bulk,tone, power   Data Reviewed: I have personally reviewed following labs and imaging studies CBC: Recent Labs  Lab 12/19/20 1639 12/21/20 0404 12/22/20 0512  WBC 10.4 10.6* 10.6*  NEUTROABS  --   --  8.6*  HGB 9.5* 9.1* 9.3*  HCT 30.8* 29.3* 31.0*  MCV 83.2 83.2 85.2  PLT 223 253 336    Basic Metabolic Panel: Recent Labs  Lab 12/18/20 0434 12/19/20 1639 12/21/20 0404 12/22/20 0512 12/23/20 0357  NA 137 136 137 141 143  K 3.8 4.0 4.0 3.8 3.8  CL 108 104 102 107 104  CO2 21* 25 23 22  21*  GLUCOSE 94 140* 119* 112* 88  BUN 22 23 27* 34* 35*  CREATININE 1.42* 1.55* 1.48* 1.65* 1.48*  CALCIUM 8.1* 8.4* 8.7* 8.8* 9.3    GFR: Estimated Creatinine Clearance: 27.3 mL/min (A) (by C-G formula based on SCr of 1.48 mg/dL (H)). Liver Function Tests: Recent Labs  Lab 12/22/20 0512   AST 18  ALT 13  ALKPHOS 47  BILITOT 1.1  PROT 6.3*  ALBUMIN 2.1*    No results for input(s): LIPASE, AMYLASE in the last 168 hours. No results for input(s): AMMONIA in the last 168 hours. Coagulation Profile: No results for input(s): INR, PROTIME in the last 168 hours. Cardiac Enzymes: No results for input(s): CKTOTAL, CKMB, CKMBINDEX, TROPONINI in the last 168 hours.  BNP (last 3 results) No results for input(s): PROBNP in the last 8760 hours. HbA1C: No results for input(s): HGBA1C  in the last 72 hours. CBG: No results for input(s): GLUCAP in the last 168 hours.  Lipid Profile: No results for input(s): CHOL, HDL, LDLCALC, TRIG, CHOLHDL, LDLDIRECT in the last 72 hours. Thyroid Function Tests: No results for input(s): TSH, T4TOTAL, FREET4, T3FREE, THYROIDAB in the last 72 hours. Anemia Panel: No results for input(s): VITAMINB12, FOLATE, FERRITIN, TIBC, IRON, RETICCTPCT in the last 72 hours. Sepsis Labs: No results for input(s): PROCALCITON, LATICACIDVEN in the last 168 hours.  Recent Results (from the past 240 hour(s))  Urine Culture     Status: None   Collection Time: 12/22/20 10:52 AM   Specimen: Urine, Catheterized  Result Value Ref Range Status   Specimen Description URINE, CATHETERIZED  Final   Special Requests NONE  Final   Culture   Final    NO GROWTH Performed at [facility], [address], [place], [place] [address]    Report Status 12/23/2020 FINAL  Final      Radiology Studies: No results found.   LOS: 10 days   Lanae Boast, MD [facility]  12/24/2020, 10:01 AM

## 2020-12-24 NOTE — Progress Notes (Signed)
Attempted to scan this patient's MRI of the brain. As soon as we moved him to our scan table he began thrashing around and became combative. Pt was then sent back to his room, he will need medication before 2nd attempt.

## 2020-12-25 DIAGNOSIS — G9341 Metabolic encephalopathy: Secondary | ICD-10-CM | POA: Diagnosis not present

## 2020-12-25 MED ORDER — SODIUM CHLORIDE 0.9 % IV SOLN: INTRAVENOUS | Status: DC

## 2020-12-25 NOTE — Progress Notes (Signed)
Corey Cummings  EXB:[id number] DOB: 1931-03-07 DOA: 12/14/2020 PCP: [name], MD    Brief Narrative:  87 year old male with hypertension chronic indwelling Foley catheter, nephrolithiasis, PE on Xarelto, BPH recently hospitalized 8/28-8/31 for severe sepsis due to catheter associated urinary tract infection with hydronephrosis secondary to right UVJ calculus, had a stent placed culture grew Citrobacter and discharged on complete course of ciprofloxacin.  Since discharge patient has been unsteady on his feet, was seen in the ED 9/3 after he took out his Foley catheter.  He has been hallucinating at times and agitated, has had falls onset on Labor Day per daughter.    In the ED CT brain no acute finding, afebrile, labs showed hemoglobin 8.9 g potassium 3.1 creatinine 2.3 UA large leukocyte many bacteria more than 50 RBC multiple WBC chest x-ray and pelvis x-ray no acute finding, renal CT interval placement of right double-J stent with tip coiled in the collecting system and distal ureter adjacent to the large UVJ stone which is unchanged in position. Previously seen severe right hydronephrosis has markedly improved. Persistent infection is not entirely excluded by imaging, though previously seen perinephric and periureteral stranding has significantly improved.    Significant Events:  Cultures obtained, placed on antibiotics and admitted with one-to-one, IV Ativan prn.  Ciprofloxacin was discontinued as it could have contributed to his confusion/hallucination.   Patient managed IV fluids, IV antibiotics supportive care and mental status improved relatively.Renal function improved but was weak deconditioned - was waiting for insurance approval for skilled nursing facility.   But he was found to be more confused and also retaining urine foley catheter exchanged 12/21/20 night > Seen by urology advised outpatient follow-up.  Patient completed cefepime, and on Diflucan.  Consultants:  None  Code  Status: FULL CODE  Antimicrobials:  Fluconazole 9/9 > Cefepime 9/7 > 9/11  DVT prophylaxis: Lovenox  Subjective: Laying in the bed in the fetal position.  No evidence of acute distress.  Respirations are comfortable.  No signs of uncontrolled pain.  Assessment & Plan:  Acute metabolic encephalopathy -agitation -unsteady gait with falls CT head at admission unrevealing -possibly related to Cipro or UTI itself -mental status continues to wax and wane -unable to obtain MRI due to agitation - monitor clinically today   Complicated UTI/pyelonephritis -chronic indwelling Foley due to BPH -right double-J stent placed August 2022 -bladder stone -yeast in urine Has completed a full course of cefepime -repeat urine culture 9/15 negative -evaluated by urology this admission with plan for outpatient follow-up and no need for acute intervention presently -continue Proscar and finasteride -complete Diflucan course through 12/29/2020  Acute kidney injury on CKD stage IIIa Baseline creatinine 1.3 -creatinine holding steady near baseline  Hypokalemia -hypomagnesemia Corrected with supplementation  Physical deconditioning PT/OT  HTN Blood pressure presently well controlled  Anemia of chronic disease Hemoglobin stable  History of DVT/PE On chronic anticoagulation -age indeterminate left popliteal DVT and acute PE November 2021  AAA Ongoing outpatient surveillance  Inguinal hernia Incidentally noted on CT abdomen/pelvis -no evidence of obstruction -ongoing outpatient monitoring    Family Communication: No family present at time of exam Status is: Inpatient  Remains inpatient appropriate because:Inpatient level of care appropriate due to severity of illness  Dispo: The patient is from: Home              Anticipated d/c is to:  Unclear              Patient currently is not medically stable to  d/c.   Difficult to place patient No   Objective: Blood pressure 115/75, pulse 85,  temperature 98.2 F (36.8 C), temperature source Oral, resp. rate 18, weight 57 kg, SpO2 98 %.  Intake/Output Summary (Last 24 hours) at 12/25/2020 0839 Last data filed at 12/25/2020 0640 Gross per 24 hour  Intake 110 ml  Output 325 ml  Net -215 ml   Filed Weights   12/15/20 2219 12/23/20 2035 12/24/20 2019  Weight: 61.1 kg 56 kg 57 kg    Examination: General: No acute respiratory distress Lungs: Clear to auscultation bilaterally without wheezes or crackles Cardiovascular: Regular rate and rhythm without murmur gallop or rub normal S1 and S2 Abdomen: Nontender, nondistended, soft, bowel sounds positive, no rebound, no ascites, no appreciable mass Extremities: No significant cyanosis, clubbing, or edema bilateral lower extremities  CBC: Recent Labs  Lab 12/19/20 1639 12/21/20 0404 12/22/20 0512  WBC 10.4 10.6* 10.6*  NEUTROABS  --   --  8.6*  HGB 9.5* 9.1* 9.3*  HCT 30.8* 29.3* 31.0*  MCV 83.2 83.2 85.2  PLT 223 253 336   Basic Metabolic Panel: Recent Labs  Lab 12/21/20 0404 12/22/20 0512 12/23/20 0357  NA 137 141 143  K 4.0 3.8 3.8  CL 102 107 104  CO2 23 22 21*  GLUCOSE 119* 112* 88  BUN 27* 34* 35*  CREATININE 1.48* 1.65* 1.48*  CALCIUM 8.7* 8.8* 9.3   GFR: Estimated Creatinine Clearance: 27.8 mL/min (A) (by C-G formula based on SCr of 1.48 mg/dL (H)).  Liver Function Tests: Recent Labs  Lab 12/22/20 0512  AST 18  ALT 13  ALKPHOS 47  BILITOT 1.1  PROT 6.3*  ALBUMIN 2.1*     Recent Results (from the past 240 hour(s))  Urine Culture     Status: None   Collection Time: 12/22/20 10:52 AM   Specimen: Urine, Catheterized  Result Value Ref Range Status   Specimen Description URINE, CATHETERIZED  Final   Special Requests NONE  Final   Culture   Final    NO GROWTH Performed at [facility], [address], [place], [place] [address]    Report Status 12/23/2020 FINAL  Final     Scheduled Meds:  acidophilus  1 capsule Oral Daily    amLODipine  5 mg Oral Daily   Chlorhexidine Gluconate Cloth  6 each Topical Daily   enoxaparin (LOVENOX) injection  60 mg Subcutaneous Q24H   finasteride  5 mg Oral QHS   fluconazole  200 mg Oral Daily   QUEtiapine  12.5 mg Oral QHS   sodium chloride flush  3 mL Intravenous Q12H   tamsulosin  0.4 mg Oral Daily     LOS: 11 days   Lonia Blood, MD [facility] Office  [phone] Pager - Text Page per Loretha Stapler  If 7PM-7AM, please contact night-coverage per Amion 12/25/2020, 8:39 AM

## 2020-12-26 DIAGNOSIS — G9341 Metabolic encephalopathy: Secondary | ICD-10-CM | POA: Diagnosis not present

## 2020-12-26 LAB — BASIC METABOLIC PANEL
Anion gap: 12 (ref 5–15)
BUN: 42 mg/dL — ABNORMAL HIGH (ref 8–23)
CO2: 23 mmol/L (ref 22–32)
Calcium: 8.5 mg/dL — ABNORMAL LOW (ref 8.9–10.3)
Chloride: 111 mmol/L (ref 98–111)
Creatinine, Ser: 1.5 mg/dL — ABNORMAL HIGH (ref 0.61–1.24)
GFR, Estimated: 45 mL/min — ABNORMAL LOW (ref >60)
Glucose, Bld: 101 mg/dL — ABNORMAL HIGH (ref 70–99)
Potassium: 3.4 mmol/L — ABNORMAL LOW (ref 3.5–5.1)
Sodium: 146 mmol/L — ABNORMAL HIGH (ref 135–145)

## 2020-12-26 LAB — AMMONIA: Ammonia: 15 umol/L (ref 9–35)

## 2020-12-26 LAB — FOLATE: Folate: 30.1 ng/mL (ref >5.9)

## 2020-12-26 LAB — VITAMIN B12: Vitamin B-12: 1026 pg/mL — ABNORMAL HIGH (ref 180–914)

## 2020-12-26 MED ORDER — POTASSIUM CHLORIDE CRYS ER 20 MEQ PO TBCR
20.0000 meq | EXTENDED_RELEASE_TABLET | Freq: Once | ORAL | Status: AC
  Administered 2020-12-26: 20 meq via ORAL
  Filled 2020-12-26: qty 1

## 2020-12-26 MED ORDER — RISAQUAD PO CAPS: 1.0000 | ORAL_CAPSULE | Freq: Every day | ORAL | Status: DC

## 2020-12-26 NOTE — TOC Progression Note (Signed)
Transition of Care [facility]) - Progression Note    Patient Details  Name: Corey Cummings MRN: [id number] Date of Birth: 1930-06-21  [facility]) CM/SW Contact  Okey Dupre Lazaro Arms, LCSW Phone Number: 12/26/2020, 4:39 PM  Clinical Narrative: CSW received call from [facility], admissions director with [employer]. [employer] denied request for ST rehab. The doctor can request a peer-to-peer by calling [phone] - option 3 before noon on 9/20. The reference number is [id number]. If the peer-to-peer is denied, the family can appeal by calling [phone], same reference number. Secure chatted Dr. Lanae Boast with above information.  CSW will continue to follow and provide SW intervention services as needed.        Expected Discharge Plan: Skilled Nursing Facility Barriers to Discharge: Continued Medical Work up  Expected Discharge Plan and Services Expected Discharge Plan: Skilled Nursing Facility In-house Referral: Clinical Social Work   Post Acute Care Choice: Skilled Nursing Facility Living arrangements for the past 2 months: Single Family Home (Patient has his own home, but was staying with step- daughter Corey Cummings before being admitted to hospital) Expected Discharge Date: 12/19/20                                     Social Determinants of Health (SDOH) Interventions    Readmission Risk Interventions No flowsheet data found.

## 2020-12-26 NOTE — Progress Notes (Signed)
PROGRESS NOTE    Corey Cummings  WUJ:[id number] DOB: 1931-01-08 DOA: 12/14/2020 PCP: [name], MD   Brief narrative:  87 year old male with hypertension chronic indwelling Foley catheter, nephrolithiasis, PE on Xarelto, BPH recently hospitalized 8/28-8/31 for severe sepsis due to catheter associated urinary tract infection with hydronephrosis secondary to right UVJ calculus, had a stent placed culture grew Citrobacter friendly and discharged on complete course of ciprofloxacin.  Since discharge patient has been unsteady on his feet, was seen in the ED 9/3 after he took out his Foley catheter.  He has been acting weird confused hallucinating at times agitated, has had falls onset on Labor day per daughter.   In the ED CT brain no acute finding, afebrile, labs showed hemoglobin 8.9 g potassium 3.1 creatinine 2.3 UA large leukocyte many bacteria more than 50 RBC multiple WBC chest x-ray and pelvis x-ray no acute finding, renal CT interval placement of right double-J stent with tip coiled in the collecting system and distal ureter adjacent to the large UVJ stone which is unchanged in position. Previously seen severe right hydronephrosis has markedly improved. 2. Small locule of air remains in the patulous right collecting system which may be related to interval intervention. Persistent infection is not entirely excluded by imaging, though previously seen perinephric and periureteral stranding has significantly improved. 3. Significantly improved left hydronephrosis with only mild residual dilation of the collecting system. 4. Unchanged horseshoe morphology"   Cultures obtained, placed on antibiotics and admitted with one-to-one, IV Ativan pnr.  Ciprofloxacin was discontinued as it could have contributed to his confusion/hallucination.  Patient managed IV fluids, IV antibiotics supportive care and mental status improved relatively.Renal function improved but was weak deconditioned - was waiting for  insurance approval for skilled nursing facility.  But he was found to be more confused and also retaining urine foley catheter exchanged 12/21/20 night Seen by urology advised outpatient follow-up.  Patient completed cefepime, and on Diflucan. Waxing and waning mental status but now improved, cont on seroquel bedtime.   Remains weak and deconditioned and awaiting for placement to skilled nursing facility.  Subjective: Seen this morning.  He is much more alert awake oriented to self current president current month Reports that he is in the hospital with kidney issues and he also has stones  Assessment & Plan:  Acute metabolic encephalopathy with agitation/hallucination, unsteady gait falls at home: CT head nonacute on admission and nonfocal.  multifactorial in the setting of UTI,ciprofloxacin induced.  Hypoxemia Mental status but worsening on 9/14 again the patient much more alert awake oriented.  He is doing very well on Seroquel we will continue the same.  MRI brain was ordered but unable to be obtained due to agitation did not tolerate-we will not pursue MRI  further as he seems to be improving. He is nonfocal on exam. Continue supportive care delirium precaution, fall precaution.  Complicated catheter associated UTI/pyelonephritis Chronic indwelling Foley in place w/ BPH Right double-J stent placed on last admission in August Bladder stone (formed R UVJ stone) Candida UTI-yeast in urine Urine culture with yeast.S/p completed cefepime.repeat urine culture 9/15 negative.   CT abdomen as above-significantly improved left hydronephrosis, markedly improved previous right severe hydronephrosis, small locule of air in patulous right collecting system may be related to interval intervention, persistent infection is not entirely excluded.  Seen by Dr. Georgiann Cocker from urology this admission -advised to follow-up with urology next week to remove the right ureteral stent, continue catheter in place continue  proscar finasteride and complete  Diflucan through 12/29/20.  Afebrile.  AKI on CKD stage IIIa Baseline creat~1.3,stable overall, cut down IV fluids to 30 mill per hour  Recent Labs    12/14/20 0655 12/15/20 0030 12/16/20 0422 12/17/20 0431 12/18/20 0434 12/19/20 1639 12/21/20 0404 12/22/20 0512 12/23/20 0357 12/26/20 0107  BUN 39* 37* 33* 28* 22 23 27* 34* 35* 42*  CREATININE 2.32* 2.30* 2.00* 1.69* 1.42* 1.55* 1.48* 1.65* 1.48* 1.50*    Hypomagnesemia/Hypokalemia: Slightly low potassium will replete orally    Gait disturbance/falls at home Physical deconditioning:  X-ray no acute fractures.  PT OT  Hypertension: Controlled on amlodipine, losartan remains on hold-will not resume on discharge.  Anemia of chronic disease.  Stable. Recent Labs  Lab 12/19/20 1639 12/21/20 0404 12/22/20 0512  HGB 9.5* 9.1* 9.3*  HCT 30.8* 29.3* 31.0*    History of DVT/PE on anticoagulation: Age indeterminate DVT in the left popliteal vein as well as acute PE in November 21, left leg DVT again seen on Doppler ultrasound on 12/05/2020.  On lovenox here-resume eliquis on d.c  AAA, known to have dilation of thoracic aortic arch 3.3 centimeter in November/21, outpatient follow-up and surveillance will be needed  Inguinal hernia noted on CT scan with a small loops of bowel without signs of obstruction.  Outpatient follow-up with surgery.  Monitor bowel movement  Diet Order             DIET DYS 3 Room service appropriate? Yes; Fluid consistency: Thin  Diet effective now                 Patient's Body mass index is 18.56 kg/m. DVT prophylaxis: lovenox Code Status:   Code Status: Full Code  Family Communication: plan of care discussed with patient at bedside.  Daughter has been updated previously.  He is alert awake and mentating well.  Status UM:[id number] Remains inpatient appropriate because: unsafe dispo Dispo:The patient is from: Home            Anticipated d/c is to:SNF pending  authorization            Patient currently is medically stable for discharge.            Difficult to place patient No Unresulted Labs (From admission, onward)    None       Medications reviewed:  Scheduled Meds:  acidophilus  1 capsule Oral Daily   amLODipine  5 mg Oral Daily   Chlorhexidine Gluconate Cloth  6 each Topical Daily   enoxaparin (LOVENOX) injection  60 mg Subcutaneous Q24H   finasteride  5 mg Oral QHS   fluconazole  200 mg Oral Daily   QUEtiapine  12.5 mg Oral QHS   sodium chloride flush  3 mL Intravenous Q12H   tamsulosin  0.4 mg Oral Daily   Continuous Infusions:  sodium chloride 75 mL/hr at 12/26/20 1004     Consultants:see note  Procedures:see note Antimicrobials: Anti-infectives (From admission, onward)    Start     Dose/Rate Route Frequency Ordered Stop   12/20/20 0000  fluconazole (DIFLUCAN) 200 MG tablet        200 mg Oral Daily 12/19/20 1444     12/16/20 1130  fluconazole (DIFLUCAN) tablet 200 mg        200 mg Oral Daily 12/16/20 1041 12/30/20 0959   12/14/20 1045  ceFEPIme (MAXIPIME) 2 g in sodium chloride 0.9 % 100 mL IVPB        2 g 200 mL/hr over 30 Minutes Intravenous  Every 24 hours 12/14/20 1043 12/18/20 1151   12/14/20 0800  cefTRIAXone (ROCEPHIN) 1 g in sodium chloride 0.9 % 100 mL IVPB        1 g 200 mL/hr over 30 Minutes Intravenous  Once 12/14/20 0751 12/14/20 0953      Culture/Microbiology    Component Value Date/Time   SDES URINE, CATHETERIZED 12/22/2020 1052   SPECREQUEST NONE 12/22/2020 1052   CULT  12/22/2020 1052    NO GROWTH Performed at [facility], [address], [place], [place] [address]    REPTSTATUS 12/23/2020 FINAL 12/22/2020 1052    Other culture-see note  Objective: Vitals: Today's Vitals   12/25/20 2151 12/26/20 0628 12/26/20 0700 12/26/20 0945  BP: 117/78 108/74  123/82  Pulse: 90 94  93  Resp: 17 18  18   Temp: 97.9 F (36.6 C) (!) 97.5 F (36.4 C)  97.9 F (36.6 C)  TempSrc: Oral Oral   Oral  SpO2: 98% 100%  99%  Weight: 57 kg     PainSc: 0-No pain  Asleep     Intake/Output Summary (Last 24 hours) at 12/26/2020 1335 Last data filed at 12/26/2020 1004 Gross per 24 hour  Intake 1937.42 ml  Output 500 ml  Net 1437.42 ml    Filed Weights   12/23/20 2035 12/24/20 2019 12/25/20 2151  Weight: 56 kg 57 kg 57 kg   Weight change: 0 kg  Intake/Output from previous day: 09/18 0701 - 09/19 0700 In: 1305.2 [P.O.:480; I.V.:825.2] Out: 500 [Urine:500] Intake/Output this shift: Total I/O In: 752.2 [P.O.:460; I.V.:292.2] Out: -  Filed Weights   12/23/20 2035 12/24/20 2019 12/25/20 2151  Weight: 56 kg 57 kg 57 kg   Examination: General exam: AAOx place people current president, situation older than stated age, weak appearing. HEENT:Oral mucosa moist, Ear/Nose WNL grossly, dentition normal. Respiratory system: bilaterally clear breath sounds, no use of accessory muscle Cardiovascular system: S1 & S2 +, No JVD,. Gastrointestinal system: Abdomen soft, NT,ND, BS+ Nervous System:Alert, awake, moving extremities and grossly nonfocal Extremities: no edema, distal peripheral pulses palpable.  Skin: No rashes,no icterus. MSK: Normal muscle bulk,tone, power   Data Reviewed: I have personally reviewed following labs and imaging studies CBC: Recent Labs  Lab 12/19/20 1639 12/21/20 0404 12/22/20 0512  WBC 10.4 10.6* 10.6*  NEUTROABS  --   --  8.6*  HGB 9.5* 9.1* 9.3*  HCT 30.8* 29.3* 31.0*  MCV 83.2 83.2 85.2  PLT 223 253 336    Basic Metabolic Panel: Recent Labs  Lab 12/19/20 1639 12/21/20 0404 12/22/20 0512 12/23/20 0357 12/26/20 0107  NA 136 137 141 143 146*  K 4.0 4.0 3.8 3.8 3.4*  CL 104 102 107 104 111  CO2 25 23 22  21* 23  GLUCOSE 140* 119* 112* 88 101*  BUN 23 27* 34* 35* 42*  CREATININE 1.55* 1.48* 1.65* 1.48* 1.50*  CALCIUM 8.4* 8.7* 8.8* 9.3 8.5*    GFR: Estimated Creatinine Clearance: 27.4 mL/min (A) (by C-G formula based on SCr of 1.5 mg/dL  (H)). Liver Function Tests: Recent Labs  Lab 12/22/20 0512  AST 18  ALT 13  ALKPHOS 47  BILITOT 1.1  PROT 6.3*  ALBUMIN 2.1*    No results for input(s): LIPASE, AMYLASE in the last 168 hours. Recent Labs  Lab 12/26/20 0107  AMMONIA 15   Coagulation Profile: No results for input(s): INR, PROTIME in the last 168 hours. Cardiac Enzymes: No results for input(s): CKTOTAL, CKMB, CKMBINDEX, TROPONINI in the last 168  hours.  BNP (last 3 results) No results for input(s): PROBNP in the last 8760 hours. HbA1C: No results for input(s): HGBA1C in the last 72 hours. CBG: No results for input(s): GLUCAP in the last 168 hours.  Lipid Profile: No results for input(s): CHOL, HDL, LDLCALC, TRIG, CHOLHDL, LDLDIRECT in the last 72 hours. Thyroid Function Tests: No results for input(s): TSH, T4TOTAL, FREET4, T3FREE, THYROIDAB in the last 72 hours. Anemia Panel: Recent Labs    12/26/20 0107  VITAMINB12 1,026*  FOLATE 30.1   Sepsis Labs: No results for input(s): PROCALCITON, LATICACIDVEN in the last 168 hours.  Recent Results (from the past 240 hour(s))  Urine Culture     Status: None   Collection Time: 12/22/20 10:52 AM   Specimen: Urine, Catheterized  Result Value Ref Range Status   Specimen Description URINE, CATHETERIZED  Final   Special Requests NONE  Final   Culture   Final    NO GROWTH Performed at [facility], [address], [place], [place] [address]    Report Status 12/23/2020 FINAL  Final      Radiology Studies: No results found.   LOS: 12 days   Lanae Boast, MD [facility]  12/26/2020, 1:35 PM

## 2020-12-26 NOTE — Plan of Care (Signed)
  Problem: Clinical Measurements: Goal: Ability to maintain clinical measurements within normal limits will improve Outcome: Progressing   Problem: Activity: Goal: Risk for activity intolerance will decrease Outcome: Progressing   

## 2020-12-26 NOTE — Progress Notes (Signed)
Physical Therapy Treatment Patient Details Name: Corey Cummings MRN: [id number] DOB: [birth year] Today's Date: 12/26/2020   History of Present Illness 87 y.o. male presents to [facility] ED on 12/14/2020 with AMS. Pt recently hospitalized 8/28-8/31 with sepsis 2/2 UTI. Pt admitted for acute metabolic encephalopathy and UTI management.PMH includes HTN, chronic indwelling Foley catheter, nephrolithiasis, ED/PE on Xarelto, and BPH.    PT Comments    Pt progressing towards his physical therapy goals. A&Ox3, however, still displays confusion and high level cognitive deficits. Pt ambulating 10 feet with a walker and moderate assist (+2 for chair follow). Pt demonstrates poor sitting/standing balance, weakness, and decreased activity tolerance. Would benefit from follow up post acute rehab to address deficits and maximize functional mobility.     Recommendations for follow up therapy are one component of a multi-disciplinary discharge planning process, led by the attending physician.  Recommendations may be updated based on patient status, additional functional criteria and insurance authorization.  Follow Up Recommendations  SNF;Supervision/Assistance - 24 hour     Equipment Recommendations  3in1 (PT);Wheelchair (measurements PT)    Recommendations for Other Services       Precautions / Restrictions Precautions Precautions: Fall Restrictions Weight Bearing Restrictions: No     Mobility  Bed Mobility Overal bed mobility: Needs Assistance Bed Mobility: Rolling;Sidelying to Sit Rolling: Min assist Sidelying to sit: Min assist       General bed mobility comments: Pt received on bed pan; rolling to right for peri care and then exiting towards right side of bed with minA at trunk to execute sitting up.    Transfers Overall transfer level: Needs assistance Equipment used: Rolling walker (2 wheeled) Transfers: Sit to/from Stand Sit to Stand: Min assist         General transfer comment: MinA  to rise and steady from edge of bed  Ambulation/Gait Ambulation/Gait assistance: Mod assist;+2 safety/equipment Gait Distance (Feet): 10 Feet Assistive device: Rolling walker (2 wheeled) Gait Pattern/deviations: Step-through pattern;Decreased stride length;Trunk flexed Gait velocity: decreased Gait velocity interpretation: <1.8 ft/sec, indicate of risk for recurrent falls General Gait Details: Erratic step length, uncoordinated, assist for walker negotiation and balance with posterior bias. Close chair follow utilized   Social research officer, government Rankin (Stroke Patients Only)       Balance Overall balance assessment: Needs assistance Sitting-balance support: Feet supported Sitting balance-Leahy Scale: Fair Sitting balance - Comments: R lean, but no physical assist required   Standing balance support: Bilateral upper extremity supported Standing balance-Leahy Scale: Poor Standing balance comment: reliant on RW                            Cognition Arousal/Alertness: Awake/alert Behavior During Therapy: Impulsive Overall Cognitive Status: Impaired/Different from baseline Area of Impairment: Attention;Memory;Following commands;Safety/judgement;Awareness;Problem solving                   Current Attention Level: Sustained Memory: Decreased short-term memory;Decreased recall of precautions Following Commands: Follows one step commands inconsistently Safety/Judgement: Decreased awareness of deficits;Decreased awareness of safety Awareness: Intellectual Problem Solving: Slow processing;Decreased initiation;Difficulty sequencing;Requires verbal cues;Requires tactile cues General Comments: A&Ox3, mildly impulsive but able to be redirected, decreased awareness of safety/deficits      Exercises      General Comments        Pertinent Vitals/Pain Pain Assessment: No/denies pain    Home Living  Prior Function            PT Goals (current goals can now be found in the care plan section) Acute Rehab PT Goals Patient Stated Goal: did not state Potential to Achieve Goals: Good Progress towards PT goals: Progressing toward goals    Frequency    Min 2X/week      PT Plan Current plan remains appropriate    Co-evaluation              AM-PAC PT "6 Clicks" Mobility   Outcome Measure  Help needed turning from your back to your side while in a flat bed without using bedrails?: A Little Help needed moving from lying on your back to sitting on the side of a flat bed without using bedrails?: A Little Help needed moving to and from a bed to a chair (including a wheelchair)?: A Lot Help needed standing up from a chair using your arms (e.g., wheelchair or bedside chair)?: A Lot Help needed to walk in hospital room?: A Lot Help needed climbing 3-5 steps with a railing? : Total 6 Click Score: 13    End of Session Equipment Utilized During Treatment: Gait belt Activity Tolerance: Patient tolerated treatment well Patient left: in chair;with chair alarm set Nurse Communication: Mobility status PT Visit Diagnosis: Unsteadiness on feet (R26.81)     Time: 2595-6387 PT Time Calculation (min) (ACUTE ONLY): 24 min  Charges:  $Therapeutic Activity: 23-37 mins                     Lillia Pauls, PT, DPT Acute Rehabilitation Services Pager [phone] Office [phone]    [name] 12/26/2020, 1:06 PM

## 2020-12-27 DIAGNOSIS — G9341 Metabolic encephalopathy: Secondary | ICD-10-CM | POA: Diagnosis not present

## 2020-12-27 LAB — CBC
HCT: 30.2 % — ABNORMAL LOW (ref 39.0–52.0)
Hemoglobin: 9 g/dL — ABNORMAL LOW (ref 13.0–17.0)
MCH: 25.4 pg — ABNORMAL LOW (ref 26.0–34.0)
MCHC: 29.8 g/dL — ABNORMAL LOW (ref 30.0–36.0)
MCV: 85.1 fL (ref 80.0–100.0)
Platelets: 277 10*3/uL (ref 150–400)
RBC: 3.55 MIL/uL — ABNORMAL LOW (ref 4.22–5.81)
RDW: 17.4 % — ABNORMAL HIGH (ref 11.5–15.5)
WBC: 5.4 10*3/uL (ref 4.0–10.5)
nRBC: 0 % (ref 0.0–0.2)

## 2020-12-27 LAB — BASIC METABOLIC PANEL
Anion gap: 13 (ref 5–15)
BUN: 33 mg/dL — ABNORMAL HIGH (ref 8–23)
CO2: 22 mmol/L (ref 22–32)
Calcium: 8.6 mg/dL — ABNORMAL LOW (ref 8.9–10.3)
Chloride: 110 mmol/L (ref 98–111)
Creatinine, Ser: 1.26 mg/dL — ABNORMAL HIGH (ref 0.61–1.24)
GFR, Estimated: 55 mL/min — ABNORMAL LOW (ref >60)
Glucose, Bld: 95 mg/dL (ref 70–99)
Potassium: 3.5 mmol/L (ref 3.5–5.1)
Sodium: 145 mmol/L (ref 135–145)

## 2020-12-27 LAB — SARS CORONAVIRUS 2 (TAT 6-24 HRS): SARS Coronavirus 2: NEGATIVE

## 2020-12-27 MED ORDER — RIVAROXABAN 10 MG PO TABS
20.0000 mg | ORAL_TABLET | Freq: Every day | ORAL | Status: DC
  Administered 2020-12-27 – 2020-12-28 (×2): 20 mg via ORAL
  Filled 2020-12-27 (×2): qty 2

## 2020-12-27 NOTE — Discharge Instructions (Addendum)
Information on my medicine - XARELTO (rivaroxaban)  WHY WAS XARELTO PRESCRIBED FOR YOU? Xarelto was prescribed to treat blood clots that may have been found in the veins of your legs (deep vein thrombosis) or in your lungs (pulmonary embolism) and to reduce the risk of them occurring again.  What do you need to know about Xarelto? The dose is one 20 mg tablet taken ONCE A DAY with your evening meal.  DO NOT stop taking Xarelto without talking to the health care provider who prescribed the medication.  Refill your prescription for 20 mg tablets before you run out.  After discharge, you should have regular check-up appointments with your healthcare provider that is prescribing your Xarelto.  In the future your dose may need to be changed if your kidney function changes by a significant amount.  What do you do if you miss a dose? If you are taking Xarelto ONCE DAILY and you miss a dose, take it as soon as you remember on the same day then continue your regularly scheduled once daily regimen the next day. Do not take two doses of Xarelto at the same time.   Important Safety Information Xarelto is a blood thinner medicine that can cause bleeding. You should call your healthcare provider right away if you experience any of the following: Bleeding from an injury or your nose that does not stop. Unusual colored urine (red or dark brown) or unusual colored stools (red or black). Unusual bruising for unknown reasons. A serious fall or if you hit your head (even if there is no bleeding).  Some medicines may interact with Xarelto and might increase your risk of bleeding while on Xarelto. To help avoid this, consult your healthcare provider or pharmacist prior to using any new prescription or non-prescription medications, including herbals, vitamins, non-steroidal anti-inflammatory drugs (NSAIDs) and supplements.  This website has more information on Xarelto: VisitDestination.com.br.

## 2020-12-27 NOTE — Progress Notes (Signed)
PROGRESS NOTE    Corey Cummings  HAL:[id number] DOB: 1931/01/16 DOA: 12/14/2020 PCP: [name], MD   Brief narrative:  87 year old male with hypertension chronic indwelling Foley catheter, nephrolithiasis, PE on Xarelto, BPH recently hospitalized 8/28-8/31 for severe sepsis due to catheter associated urinary tract infection with hydronephrosis secondary to right UVJ calculus, had a stent placed culture grew Citrobacter friendly and discharged on complete course of ciprofloxacin.  Since discharge patient has been unsteady on his feet, was seen in the ED 9/3 after he took out his Foley catheter.  He has been acting weird confused hallucinating at times agitated, has had falls onset on Labor day per daughter.   In the ED CT brain no acute finding, afebrile, labs showed hemoglobin 8.9 g potassium 3.1 creatinine 2.3 UA large leukocyte many bacteria more than 50 RBC multiple WBC chest x-ray and pelvis x-ray no acute finding, renal CT interval placement of right double-J stent with tip coiled in the collecting system and distal ureter adjacent to the large UVJ stone which is unchanged in position. Previously seen severe right hydronephrosis has markedly improved. 2. Small locule of air remains in the patulous right collecting system which may be related to interval intervention. Persistent infection is not entirely excluded by imaging, though previously seen perinephric and periureteral stranding has significantly improved. 3. Significantly improved left hydronephrosis with only mild residual dilation of the collecting system. 4. Unchanged horseshoe morphology"   Cultures obtained, placed on antibiotics and admitted with one-to-one, IV Ativan pnr.  Ciprofloxacin was discontinued as it could have contributed to his confusion/hallucination.  Patient managed IV fluids, IV antibiotics supportive care and mental status improved relatively.Renal function improved but was weak deconditioned - was waiting for  insurance approval for skilled nursing facility.  But he was found to be more confused and also retaining urine foley catheter exchanged 12/21/20 night Seen by urology advised outpatient follow-up.  Patient completed cefepime, and on Diflucan. Waxing and waning mental status but now improved, cont on seroquel bedtime.   Remains weak and deconditioned and awaiting for placement to skilled nursing facility.  Subjective:  Seen earlier in the morning he was just waking up appeared confused but after he had breakfast he was more alert awake able to tell me the current location current president that he is here in the hospital due to fall.  Assessment & Plan:  Acute metabolic encephalopathy with agitation/hallucination, unsteady gait falls at home: CT head nonacute on admission and nonfocal.  multifactorial in the setting of UTI,ciprofloxacin use, deconditioning, overlays. Mental status waxing and waning but confused during night and in the morning. Overall stable.Did not tolerate MRI so canceled.Continue on fall precautions,delirium precautions and supportive care.  Falls at home/Physical deconditioning:X-ray no acute fractures.PT/OT to continue.  He is unsafe to discharge home.  Will need skilled nursing facility placement.  Complicated catheter associated UTI/pyelonephritis Chronic indwelling Foley in place w/ BPH Right double-J stent placed on last admission in August Bladder stone (formed R UVJ stone) Candida UTI-yeast in urine Urine culture with yeast.S/p completed cefepime.repeat urine culture 9/15 negative.   CT abdomen as above-significantly improved left hydronephrosis, markedly improved previous right severe hydronephrosis, small locule of air in patulous right collecting system may be related to interval intervention, persistent infection is not entirely excluded.Seen by Dr. Cardell Peach from urology this admission -advised to follow-up with urology next week to remove the right ureteral stent,  continue catheter in place continue proscar finasteride and complete Diflucan through 12/29/20.  Afebrile.  AKI on  CKD stage IIIa Baseline creat~1.3 and resolved. D/c ivf and cont PO. Recent Labs    12/15/20 0030 12/16/20 0422 12/17/20 0431 12/18/20 0434 12/19/20 1639 12/21/20 0404 12/22/20 0512 12/23/20 0357 12/26/20 0107 12/27/20 0514  BUN 37* 33* 28* 22 23 27* 34* 35* 42* 33*  CREATININE 2.30* 2.00* 1.69* 1.42* 1.55* 1.48* 1.65* 1.48* 1.50* 1.26*    Hypomagnesemia/Hypokalemia:resolved.  Hypertension: Stable on amlodipine.losartan remains on hold-will not resume on discharge.  Anemia of chronic disease.  Stable. Recent Labs  Lab 12/21/20 0404 12/22/20 0512 12/27/20 0514  HGB 9.1* 9.3* 9.0*  HCT 29.3* 31.0* 30.2*    History of DVT/PE on anticoagulation: Age indeterminate DVT in the left popliteal vein as well as acute PE in November 21, left leg DVT again seen on Doppler ultrasound on 12/05/2020.  Resume his Xarelto, is on lovenox  AAA, known to have dilation of thoracic aortic arch 3.3 centimeter in November/21, outpatient follow-up and surveillance will be needed  Inguinal hernia noted on CT scan with a small loops of bowel without signs of obstruction.  Outpatient follow-up with surgery.  Monitor bowel movement  Diet Order             DIET DYS 3 Room service appropriate? Yes; Fluid consistency: Thin  Diet effective now                 Patient's Body mass index is 18.56 kg/m. DVT prophylaxis: lovenox Code Status:   Code Status: Full Code  Family Communication: plan of care discussed with patient at bedside.  Daughter has been updated previously.   Status NU:[id number] Remains inpatient appropriate because: unsafe dispo Dispo:The patient is from: Home            Anticipated d/c is to:SNF pending authorization, doing P2P. left message for call back            Patient currently is medically stable for discharge.            Difficult to place patient  No Unresulted Labs (From admission, onward)    None       Medications reviewed:  Scheduled Meds:  acidophilus  1 capsule Oral Daily   amLODipine  5 mg Oral Daily   Chlorhexidine Gluconate Cloth  6 each Topical Daily   enoxaparin (LOVENOX) injection  60 mg Subcutaneous Q24H   finasteride  5 mg Oral QHS   fluconazole  200 mg Oral Daily   QUEtiapine  12.5 mg Oral QHS   sodium chloride flush  3 mL Intravenous Q12H   tamsulosin  0.4 mg Oral Daily   Continuous Infusions:  sodium chloride 30 mL/hr at 12/27/20 0545     Consultants:see note  Procedures:see note Antimicrobials: Anti-infectives (From admission, onward)    Start     Dose/Rate Route Frequency Ordered Stop   12/20/20 0000  fluconazole (DIFLUCAN) 200 MG tablet        200 mg Oral Daily 12/19/20 1444     12/16/20 1130  fluconazole (DIFLUCAN) tablet 200 mg        200 mg Oral Daily 12/16/20 1041 12/30/20 0959   12/14/20 1045  ceFEPIme (MAXIPIME) 2 g in sodium chloride 0.9 % 100 mL IVPB        2 g 200 mL/hr over 30 Minutes Intravenous Every 24 hours 12/14/20 1043 12/18/20 1151   12/14/20 0800  cefTRIAXone (ROCEPHIN) 1 g in sodium chloride 0.9 % 100 mL IVPB        1 g 200 mL/hr over  30 Minutes Intravenous  Once 12/14/20 0751 12/14/20 0953      Culture/Microbiology    Component Value Date/Time   SDES URINE, CATHETERIZED 12/22/2020 1052   SPECREQUEST NONE 12/22/2020 1052   CULT  12/22/2020 1052    NO GROWTH Performed at [facility], [address], [place], [place] [address]    REPTSTATUS 12/23/2020 FINAL 12/22/2020 1052    Other culture-see note  Objective: Vitals: Today's Vitals   12/26/20 1958 12/26/20 2200 12/27/20 0900 12/27/20 0907  BP: 136/83   115/70  Pulse: 88   91  Resp: 18     Temp: (!) 97.5 F (36.4 C)   98 F (36.7 C)  TempSrc: Oral   Oral  SpO2: 98%   98%  Weight:      PainSc:  0-No pain 0-No pain     Intake/Output Summary (Last 24 hours) at 12/27/2020 1028 Last data filed at  12/27/2020 0400 Gross per 24 hour  Intake 812.31 ml  Output 1600 ml  Net -787.69 ml    Filed Weights   12/23/20 2035 12/24/20 2019 12/25/20 2151  Weight: 56 kg 57 kg 57 kg   Weight change:   Intake/Output from previous day: 09/19 0701 - 09/20 0700 In: 1564.6 [P.O.:757; I.V.:807.6] Out: 1600 [Urine:1600] Intake/Output this shift: No intake/output data recorded. Filed Weights   12/23/20 2035 12/24/20 2019 12/25/20 2151  Weight: 56 kg 57 kg 57 kg   Examination: General exam: AAOx place, self, president, older than stated age, weak appearing. HEENT:Oral mucosa moist, Ear/Nose WNL grossly, dentition normal. Respiratory system: bilaterally clear, no use of accessory muscle Cardiovascular system: S1 & S2 +, No JVD,. Gastrointestinal system: Abdomen soft,NT,ND, BS+ Nervous System:Alert, awake, moving extremities and grossly nonfocal Extremities: no edema, distal peripheral pulses palpable.  Skin: No rashes,no icterus. MSK: Normal muscle bulk,tone, power  Foley+  Data Reviewed: I have personally reviewed following labs and imaging studies CBC: Recent Labs  Lab 12/21/20 0404 12/22/20 0512 12/27/20 0514  WBC 10.6* 10.6* 5.4  NEUTROABS  --  8.6*  --   HGB 9.1* 9.3* 9.0*  HCT 29.3* 31.0* 30.2*  MCV 83.2 85.2 85.1  PLT 253 336 277    Basic Metabolic Panel: Recent Labs  Lab 12/21/20 0404 12/22/20 0512 12/23/20 0357 12/26/20 0107 12/27/20 0514  NA 137 141 143 146* 145  K 4.0 3.8 3.8 3.4* 3.5  CL 102 107 104 111 110  CO2 23 22 21* 23 22  GLUCOSE 119* 112* 88 101* 95  BUN 27* 34* 35* 42* 33*  CREATININE 1.48* 1.65* 1.48* 1.50* 1.26*  CALCIUM 8.7* 8.8* 9.3 8.5* 8.6*    GFR: Estimated Creatinine Clearance: 32.7 mL/min (A) (by C-G formula based on SCr of 1.26 mg/dL (H)). Liver Function Tests: Recent Labs  Lab 12/22/20 0512  AST 18  ALT 13  ALKPHOS 47  BILITOT 1.1  PROT 6.3*  ALBUMIN 2.1*    No results for input(s): LIPASE, AMYLASE in the last 168  hours. Recent Labs  Lab 12/26/20 0107  AMMONIA 15    Coagulation Profile: No results for input(s): INR, PROTIME in the last 168 hours. Cardiac Enzymes: No results for input(s): CKTOTAL, CKMB, CKMBINDEX, TROPONINI in the last 168 hours.  BNP (last 3 results) No results for input(s): PROBNP in the last 8760 hours. HbA1C: No results for input(s): HGBA1C in the last 72 hours. CBG: No results for input(s): GLUCAP in the last 168 hours.  Lipid Profile: No results for input(s): CHOL, HDL, LDLCALC,  TRIG, CHOLHDL, LDLDIRECT in the last 72 hours. Thyroid Function Tests: No results for input(s): TSH, T4TOTAL, FREET4, T3FREE, THYROIDAB in the last 72 hours. Anemia Panel: Recent Labs    12/26/20 0107  VITAMINB12 1,026*  FOLATE 30.1    Sepsis Labs: No results for input(s): PROCALCITON, LATICACIDVEN in the last 168 hours.  Recent Results (from the past 240 hour(s))  Urine Culture     Status: None   Collection Time: 12/22/20 10:52 AM   Specimen: Urine, Catheterized  Result Value Ref Range Status   Specimen Description URINE, CATHETERIZED  Final   Special Requests NONE  Final   Culture   Final    NO GROWTH Performed at [facility], [address], [place], [place] [address]    Report Status 12/23/2020 FINAL  Final      Radiology Studies: No results found.   LOS: 13 days   Lanae Boast, MD [facility]  12/27/2020, 10:28 AM

## 2020-12-27 NOTE — TOC Progression Note (Signed)
Transition of Care [facility]) - Progression Note    Patient Details  Name: Corey Cummings MRN: [id number] Date of Birth: [birth year]  Transition of Care [facility]) CM/SW Contact  Okey Dupre Lazaro Arms, LCSW Phone Number: 12/27/2020, 5:20 PM  Clinical Narrative:  CSW received call from [facility], admissions director at [facility] (4:49 pm) informing CSW that insurance auth received and they can take patient tomorrow. Informed her that COVID test would be done today. Secure chat to MD and informed him that facility received insurance auth and can take patient tomorrow. Requested order for COVID test. Informed patient's nurse regarding insurance auth and MD putting in order for COVID test.   Contacted daughter Minerva Areola [phone]) and sister-in-law Earnestine Leys [phone]) and informed them of receiving insurance auth and discharge on Wednesday. Ms. Marya Landry was advised that she will be contacted tomorrow to let her know when patient leaving hospital (once transport arrives).    Expected Discharge Plan: Skilled Nursing Facility Barriers to Discharge: Continued Medical Work up  Expected Discharge Plan and Services Expected Discharge Plan: Skilled Nursing Facility In-house Referral: Clinical Social Work   Post Acute Care Choice: Skilled Nursing Facility Living arrangements for the past 2 months: Single Family Home (Patient has his own home, but was staying with step- daughter [name] before being admitted to hospital) Expected Discharge Date: 12/19/20                                     Social Determinants of Health (SDOH) Interventions    Readmission Risk Interventions No flowsheet data found.

## 2020-12-27 NOTE — Progress Notes (Signed)
Occupational Therapy Treatment Patient Details Name: Corey Cummings MRN: [id number] DOB: [birth year] Today's Date: 12/27/2020   History of present illness 87 y.o. male [facility] to [facility] ED on 12/14/2020 with AMS. Pt recently hospitalized 8/28-8/31 with sepsis 2/2 UTI. Pt admitted for acute metabolic encephalopathy and UTI management.PMH includes HTN, chronic indwelling Foley catheter, nephrolithiasis, ED/PE on Xarelto, and BPH.   OT comments  Pt progressing slow but steady towards acute OT goals. Mod A +2 to take pivotal steps to recliner. Noted fluid-filled swelling above IV site in R upper arm; nursing notified and come to the room to assess and treat. Full session details below. D/c plan remains appropriate.   Recommendations for follow up therapy are one component of a multi-disciplinary discharge planning process, led by the attending physician.  Recommendations may be updated based on patient status, additional functional criteria and insurance authorization.    Follow Up Recommendations  SNF;Supervision/Assistance - 24 hour    Equipment Recommendations  Other (comment) (defer to next venue)    Recommendations for Other Services      Precautions / Restrictions Precautions Precautions: Fall Precaution Comments: strong R sided lean in sitting and standing Restrictions Weight Bearing Restrictions: No       Mobility Bed Mobility Overal bed mobility: Needs Assistance Bed Mobility: Rolling;Sidelying to Sit Rolling: Min guard Sidelying to sit: Mod assist       General bed mobility comments: extra time, difficulty sequencing. assist to fully advance BLE off EOB and to elevate trunk.    Transfers Overall transfer level: Needs assistance Equipment used: Rolling walker (2 wheeled) Transfers: Sit to/from [employer] Sit to Stand: Min assist Stand pivot transfers: Mod assist;+2 safety/equipment       General transfer comment: difficulty sequencing. munlitmodal cueing  needed. Generalized weakness and impaired balance remains.    Balance Overall balance assessment: Needs assistance Sitting-balance support: Feet supported Sitting balance-Leahy Scale: Fair Sitting balance - Comments: R lean, but no physical assist required Postural control: Right lateral lean Standing balance support: Bilateral upper extremity supported Standing balance-Leahy Scale: Poor Standing balance comment: RW and min A in static standing                           ADL either performed or assessed with clinical judgement   ADL Overall ADL's : Needs assistance/impaired                         Toilet Transfer: Moderate assistance;+2 for safety/equipment;Stand-pivot;BSC Toilet Transfer Details (indicate cue type and reason): utilized rw but pt having max difficulty managing rw, may do better with +2 HHA           General ADL Comments: Pt completed bed mobility, sat EOB a few minutes then took pivotal steps to sit up in recliner. up to mod A +2 d/t unsteadiness/weakness.     Vision       Perception     Praxis      Cognition Arousal/Alertness: Awake/alert Behavior During Therapy: Impulsive Overall Cognitive Status: Impaired/Different from baseline Area of Impairment: Attention;Memory;Following commands;Safety/judgement;Awareness;Problem solving                 Orientation Level: Disoriented to;Place;Time;Situation Current Attention Level: Sustained Memory: Decreased short-term memory;Decreased recall of precautions Following Commands: Follows one step commands inconsistently Safety/Judgement: Decreased awareness of deficits;Decreased awareness of safety Awareness: Intellectual Problem Solving: Slow processing;Decreased initiation;Difficulty sequencing;Requires verbal cues;Requires tactile cues General Comments: multi modal  cueing needed at times. difficulty following 1 step commands. limited recall        Exercises     Shoulder  Instructions       General Comments      Pertinent Vitals/ Pain       Pain Assessment: Faces Faces Pain Scale: Hurts a little bit Pain Location: R knee in standing Pain Descriptors / Indicators: Grimacing;Guarding Pain Intervention(s): Limited activity within patient's tolerance;Monitored during session;Repositioned  Home Living                                          Prior Functioning/Environment              Frequency  Min 2X/week        Progress Toward Goals  OT Goals(current goals can now be found in the care plan section)  Progress towards OT goals: Progressing toward goals  Acute Rehab OT Goals Patient Stated Goal: did not state OT Goal Formulation: With patient Time For Goal Achievement: 01/09/21 Potential to Achieve Goals: Fair ADL Goals Pt Will Perform Grooming: with supervision;standing Pt Will Perform Lower Body Bathing: with supervision;sit to/from stand Pt Will Perform Lower Body Dressing: with supervision;sit to/from stand Pt Will Transfer to Toilet: with supervision;ambulating;regular height toilet Pt Will Perform Toileting - Clothing Manipulation and hygiene: with supervision;sit to/from stand  Plan Discharge plan remains appropriate;Frequency remains appropriate    Co-evaluation                 AM-PAC OT "6 Clicks" Daily Activity     Outcome Measure   Help from another person eating meals?: Total Help from another person taking care of personal grooming?: A Lot Help from another person toileting, which includes using toliet, bedpan, or urinal?: Total Help from another person bathing (including washing, rinsing, drying)?: Total Help from another person to put on and taking off regular upper body clothing?: Total Help from another person to put on and taking off regular lower body clothing?: Total 6 Click Score: 7    End of Session Equipment Utilized During Treatment: Rolling walker;Gait belt  OT Visit Diagnosis:  Unsteadiness on feet (R26.81);Other abnormalities of gait and mobility (R26.89);Muscle weakness (generalized) (M62.81);Other symptoms and signs involving cognitive function   Activity Tolerance Patient tolerated treatment well   Patient Left in chair;with call bell/phone within reach;with chair alarm set   Nurse Communication Other (comment) (IV appears to have infiltrated, fluid-filled swelling in R upper arm)        Time: 2130-8657 OT Time Calculation (min): 17 min  Charges: OT General Charges $OT Visit: 1 Visit OT Treatments $Self Care/Home Management : 8-22 mins  Raynald Kemp, OT Acute Rehabilitation Services Pager: [phone] Office: [phone]   Pilar Grammes 12/27/2020, 2:13 PM

## 2020-12-27 NOTE — Progress Notes (Signed)
ANTICOAGULATION CONSULT NOTE  Pharmacy Consult for Lovenox > Xarelto Indication:  hx DVT and PE  No Known Allergies  Patient Measurements: Weight: 57 kg (125 lb 10.6 oz) Lovenox Dosing Weight: 57 kg  Vital Signs: Temp: 98 F (36.7 C) (09/20 0907) Temp Source: Oral (09/20 0907) BP: 115/70 (09/20 0907) Pulse Rate: 91 (09/20 0907)  Labs: Recent Labs    12/26/20 0107 12/27/20 0514  HGB  --  9.0*  HCT  --  30.2*  PLT  --  277  CREATININE 1.50* 1.26*    Estimated Creatinine Clearance: 32.7 mL/min (A) (by C-G formula based on SCr of 1.26 mg/dL (H)).  Assessment:  87 yr old male on Xarelto PTA for history of age-indeterminate DVT and acute PE in November 2021. During recent admission, dopplers showed left leg DVT again on 8/29 and patient continued on Xarelto.      Transitioned to full-dose Lovenox on 9/8. To transition back to Xarelto today.  AKI on CKD, creatinine back to baseline.  Last Lovenox dose 9/19 ~2pm.   Goal of Therapy:  Anti-Xa level 0.6-1 units/ml 4hrs after LMWH dose given Appropriate Xarelto regimen for indication Monitor platelets by anticoagulation protocol: Yes   Plan:  Discontinue Enoxaparin. Resume Xarelto 20 mg daily with supper. May give today's dose early.  Dennie Fetters, RPh 12/27/2020,1:20 PM

## 2020-12-28 DIAGNOSIS — Z515 Encounter for palliative care: Secondary | ICD-10-CM

## 2020-12-28 DIAGNOSIS — Z7189 Other specified counseling: Secondary | ICD-10-CM | POA: Diagnosis not present

## 2020-12-28 DIAGNOSIS — G9341 Metabolic encephalopathy: Secondary | ICD-10-CM | POA: Diagnosis not present

## 2020-12-28 LAB — CBC
HCT: 31.4 % — ABNORMAL LOW (ref 39.0–52.0)
Hemoglobin: 9.5 g/dL — ABNORMAL LOW (ref 13.0–17.0)
MCH: 25.8 pg — ABNORMAL LOW (ref 26.0–34.0)
MCHC: 30.3 g/dL (ref 30.0–36.0)
MCV: 85.3 fL (ref 80.0–100.0)
Platelets: 294 10*3/uL (ref 150–400)
RBC: 3.68 MIL/uL — ABNORMAL LOW (ref 4.22–5.81)
RDW: 17.2 % — ABNORMAL HIGH (ref 11.5–15.5)
WBC: 6.4 10*3/uL (ref 4.0–10.5)
nRBC: 0 % (ref 0.0–0.2)

## 2020-12-28 LAB — BASIC METABOLIC PANEL
Anion gap: 12 (ref 5–15)
BUN: 28 mg/dL — ABNORMAL HIGH (ref 8–23)
CO2: 22 mmol/L (ref 22–32)
Calcium: 8.7 mg/dL — ABNORMAL LOW (ref 8.9–10.3)
Chloride: 112 mmol/L — ABNORMAL HIGH (ref 98–111)
Creatinine, Ser: 1.42 mg/dL — ABNORMAL HIGH (ref 0.61–1.24)
GFR, Estimated: 48 mL/min — ABNORMAL LOW (ref >60)
Glucose, Bld: 77 mg/dL (ref 70–99)
Potassium: 3.7 mmol/L (ref 3.5–5.1)
Sodium: 146 mmol/L — ABNORMAL HIGH (ref 135–145)

## 2020-12-28 MED ORDER — ADULT MULTIVITAMIN W/MINERALS CH
1.0000 | ORAL_TABLET | Freq: Every day | ORAL | Status: DC
  Administered 2020-12-29: 1 via ORAL
  Filled 2020-12-28: qty 1

## 2020-12-28 MED ORDER — OLANZAPINE 5 MG PO TBDP: 2.5000 mg | ORAL_TABLET | Freq: Every day | ORAL | Status: DC

## 2020-12-28 MED ORDER — ENSURE ENLIVE PO LIQD
237.0000 mL | Freq: Two times a day (BID) | ORAL | Status: DC
  Administered 2020-12-29: 237 mL via ORAL

## 2020-12-28 MED ORDER — OLANZAPINE 5 MG PO TBDP
2.5000 mg | ORAL_TABLET | Freq: Every evening | ORAL | Status: DC | PRN
  Administered 2020-12-28: 2.5 mg via ORAL
  Filled 2020-12-28 (×2): qty 0.5

## 2020-12-28 MED ORDER — THIAMINE HCL 100 MG/ML IJ SOLN
200.0000 mg | Freq: Three times a day (TID) | INTRAVENOUS | Status: AC
  Administered 2020-12-28 – 2020-12-29 (×4): 200 mg via INTRAVENOUS
  Filled 2020-12-28 (×4): qty 2

## 2020-12-28 MED ORDER — SODIUM CHLORIDE 0.45 % IV SOLN: INTRAVENOUS | Status: DC

## 2020-12-28 NOTE — Progress Notes (Signed)
PROGRESS NOTE    Corey Cummings  YTK:[id number] DOB: July 26, 1930 DOA: 12/14/2020 PCP: [name], MD   Brief narrative:  87 year old male with hypertension chronic indwelling Foley catheter, nephrolithiasis, PE on Xarelto, BPH recently hospitalized 8/28-8/31 for severe sepsis due to catheter associated urinary tract infection with hydronephrosis secondary to right UVJ calculus, had a stent placed culture grew Citrobacter friendly and discharged on complete course of ciprofloxacin.  Since discharge patient has been unsteady on his feet, was seen in the ED 9/3 after he took out his Foley catheter.  He has been acting weird confused hallucinating at times agitated, has had falls onset on Labor day per daughter.   In the ED CT brain no acute finding, afebrile, labs showed hemoglobin 8.9 g potassium 3.1 creatinine 2.3 UA large leukocyte many bacteria more than 50 RBC multiple WBC chest x-ray and pelvis x-ray no acute finding, renal CT interval placement of right double-J stent with tip coiled in the collecting system and distal ureter adjacent to the large UVJ stone which is unchanged in position. Previously seen severe right hydronephrosis has markedly improved. 2. Small locule of air remains in the patulous right collecting system which may be related to interval intervention. Persistent infection is not entirely excluded by imaging, though previously seen perinephric and periureteral stranding has significantly improved. 3. Significantly improved left hydronephrosis with only mild residual dilation of the collecting system. 4. Unchanged horseshoe morphology"   Cultures obtained, placed on antibiotics and admitted with one-to-one, IV Ativan pnr.  Ciprofloxacin was discontinued as it could have contributed to his confusion/hallucination.  Patient managed IV fluids, IV antibiotics supportive care and mental status improved relatively.Renal function improved but was weak deconditioned - was waiting for  insurance approval for skilled nursing facility.  But he was found to be more confused and also retaining urine foley catheter exchanged 12/21/20 night Seen by urology advised outpatient follow-up.  Patient completed cefepime, and on Diflucan. Waxing and waning mental status but now improved, cont on seroquel bedtime.   Remains weak and deconditioned .  Subjective:  Seen and examined this morning 2 times Appears more confused. Was able to eat his meal earlier in the morning  Assessment & Plan:  Acute metabolic encephalopathy with agitation/hallucination, unsteady gait falls at home: CT head nonacute on admission and nonfocal.  multifactorial in the setting of UTI,ciprofloxacin use, deconditioning, old age, .Mental status waxing and waning-this morning appears more confused. Did not tolerate MRI on last Sunday so canceled.continue supportive care fall precaution delirium precaution.  On Seroquel bedtime stop it, add prn zyprexa  low dose bedtime. We will start high-dose IV thiamine since oral intake has been poor and daughter endorses significant weight loss.  Falls at home/Physical deconditioning:X-ray no acute fractures.PT/OT to continue.  He is unsafe to discharge home.  Will need skilled nursing facility placement on D/C.  Complicated catheter associated UTI/pyelonephritis Chronic indwelling Foley in place w/ BPH Right double-J stent placed on last admission in August Bladder stone (formed R UVJ stone) Candida UTI-yeast in urine Urine culture with yeast.S/p completed cefepime.repeat urine culture 9/15 negative.   CT abdomen as above-significantly improved left hydronephrosis, markedly improved previous right severe hydronephrosis, small locule of air in patulous right collecting system may be related to interval intervention, persistent infection is not entirely excluded.Seen by Dr. Cardell Peach from urology this admission -advised to follow-up with urology next week to remove the right ureteral  stent, continue catheter in place continue proscar finasteride and complete Diflucan through 12/29/20.  Last  urine culture from 9/15 was negative  AKI on CKD stage IIIa Baseline creat~1.3 .we will repeat labs this morning.   Creatinine slightly uptrending 1.4 yesterday IV fluids for overnight Recent Labs    12/16/20 0422 12/17/20 0431 12/18/20 0434 12/19/20 1639 12/21/20 0404 12/22/20 0512 12/23/20 0357 12/26/20 0107 12/27/20 0514 12/28/20 1039  BUN 33* 28* 22 23 27* 34* 35* 42* 33* 28*  CREATININE 2.00* 1.69* 1.42* 1.55* 1.48* 1.65* 1.48* 1.50* 1.26* 1.42*   Hypomagnesemia/Hypokalemia:resolved. Bmp pending Hyponatremia mild start IV fluids x 24 hrs Hypertension: Controlled continue amlodipine.discontinued his losartan.  Anemia of chronic disease.  HB stable. Recent Labs  Lab 12/22/20 0512 12/27/20 0514 12/28/20 1039  HGB 9.3* 9.0* 9.5*  HCT 31.0* 30.2* 31.4*   History of DVT/PE on anticoagulation: Age indeterminate DVT in the left popliteal vein as well as acute PE in November 21, left leg DVT again seen on Doppler ultrasound on 12/05/2020.  Resume his Xarelto, is on lovenox  AAA, known to have dilation of thoracic aortic arch 3.3 centimeter in November/21, outpatient follow-up and surveillance will be needed  Inguinal hernia noted on CT scan with a small loops of bowel without signs of obstruction.  Outpatient follow-up with surgery.  Monitor bowel movement  Recent weight loss suspect severe protein calorie mlanutrition, BMI is at 18: wt:67 kg on 8/29 and this admission 57 kg. augment nutritional status dietitian has been consulted.    Goals of care: Currently full code, with multiple comorbidities and ongoing encephalopathy does not appear bright.  We will request palliative medicine consult.  Continue provide supportive care.  Diet Order             DIET DYS 3 Room service appropriate? Yes; Fluid consistency: Thin  Diet effective now                 Patient's Body  mass index is 18.56 kg/m. DVT prophylaxis: rivaroxaban (XARELTO) tablet 20 mg Start: 12/27/20 1200lovenox Code Status:   Code Status: DNR  Family Communication: plan of care discussed with patient at bedside.  Daughter has been updated previously-unable to reach over the phone yesterday, but daughter visited him yesterday evening.  ID*to the daughter and explained in detail she verbalized understanding he may have some delirium ongoing with baseline MCI may take a while to clear.  She is agreeable for discharge to skilled nursing facility tomorrow.  We will hydrate him with IV fluids overnight  Status NO:[id number] Remains inpatient appropriate because: unsafe dispo Dispo:The patient is from: Home            Anticipated d/c is to:SNF one stable [facility] TOMORROW.            Patient currently is NOT medically stable for discharge.            Difficult to place patient No Unresulted Labs (From admission, onward)    None      Medications reviewed:  Scheduled Meds:  acidophilus  1 capsule Oral Daily   amLODipine  5 mg Oral Daily   Chlorhexidine Gluconate Cloth  6 each Topical Daily   finasteride  5 mg Oral QHS   fluconazole  200 mg Oral Daily   rivaroxaban  20 mg Oral Q supper   sodium chloride flush  3 mL Intravenous Q12H   tamsulosin  0.4 mg Oral Daily   Continuous Infusions: Consultants:see note  Procedures:see note Antimicrobials: Anti-infectives (From admission, onward)    Start     Dose/Rate Route Frequency Ordered  Stop   12/20/20 0000  fluconazole (DIFLUCAN) 200 MG tablet        200 mg Oral Daily 12/19/20 1444     12/16/20 1130  fluconazole (DIFLUCAN) tablet 200 mg        200 mg Oral Daily 12/16/20 1041 12/30/20 0959   12/14/20 1045  ceFEPIme (MAXIPIME) 2 g in sodium chloride 0.9 % 100 mL IVPB        2 g 200 mL/hr over 30 Minutes Intravenous Every 24 hours 12/14/20 1043 12/18/20 1151   12/14/20 0800  cefTRIAXone (ROCEPHIN) 1 g in sodium chloride 0.9 % 100 mL IVPB        1  g 200 mL/hr over 30 Minutes Intravenous  Once 12/14/20 0751 12/14/20 0953      Culture/Microbiology    Component Value Date/Time   SDES URINE, CATHETERIZED 12/22/2020 1052   SPECREQUEST NONE 12/22/2020 1052   CULT  12/22/2020 1052    NO GROWTH Performed at [facility], [address] [place], [place] [address]    REPTSTATUS 12/23/2020 FINAL 12/22/2020 1052    Other culture-see note  Objective: Vitals: Today's Vitals   12/28/20 0544 12/28/20 0829 12/28/20 0954 12/28/20 1018  BP: 109/68  116/77 97/72  Pulse: 94  76 90  Resp:   18   Temp: 97.7 F (36.5 C)     TempSrc: Oral     SpO2: 100%  (!) 84% 98%  Weight:      PainSc:  0-No pain      Intake/Output Summary (Last 24 hours) at 12/28/2020 1552 Last data filed at 12/28/2020 1237 Gross per 24 hour  Intake 520 ml  Output 1100 ml  Net -580 ml   Filed Weights   12/23/20 2035 12/24/20 2019 12/25/20 2151  Weight: 56 kg 57 kg 57 kg   Weight change:   Intake/Output from previous day: 09/20 0701 - 09/21 0700 In: 600 [P.O.:600] Out: 1100 [Urine:1100] Intake/Output this shift: Total I/O In: 320 [P.O.:320] Out: -  Filed Weights   12/23/20 2035 12/24/20 2019 12/25/20 2151  Weight: 56 kg 57 kg 57 kg   Examination: General exam: Confused alert awake , , HEENT:Oral mucosa moist, Ear/Nose WNL grossly, dentition normal. Respiratory system: bilaterally clear, no use of accessory muscle Cardiovascular system: S1 & S2 +, No JVD,. Gastrointestinal system: Abdomen soft,NT,ND, BS+ Nervous System:Alert, awake, confused, moving all his extremities and grossly nonfocal although weakness is generalized. Extremities: no edema, distal peripheral pulses palpable.  Skin: No rashes,no icterus. MSK: Normal muscle bulk,tone, power   Data Reviewed: I have personally reviewed following labs and imaging studies CBC: Recent Labs  Lab 12/22/20 0512 12/27/20 0514 12/28/20 1039  WBC 10.6* 5.4 6.4  NEUTROABS 8.6*  --   --   HGB  9.3* 9.0* 9.5*  HCT 31.0* 30.2* 31.4*  MCV 85.2 85.1 85.3  PLT 336 277 294   Basic Metabolic Panel: Recent Labs  Lab 12/22/20 0512 12/23/20 0357 12/26/20 0107 12/27/20 0514 12/28/20 1039  NA 141 143 146* 145 146*  K 3.8 3.8 3.4* 3.5 3.7  CL 107 104 111 110 112*  CO2 22 21* 23 22 22   GLUCOSE 112* 88 101* 95 77  BUN 34* 35* 42* 33* 28*  CREATININE 1.65* 1.48* 1.50* 1.26* 1.42*  CALCIUM 8.8* 9.3 8.5* 8.6* 8.7*   GFR: Estimated Creatinine Clearance: 29 mL/min (A) (by C-G formula based on SCr of 1.42 mg/dL (H)). Liver Function Tests: Recent Labs  Lab 12/22/20 0512  AST 18  ALT 13  ALKPHOS 47  BILITOT 1.1  PROT 6.3*  ALBUMIN 2.1*   No results for input(s): LIPASE, AMYLASE in the last 168 hours. Recent Labs  Lab 12/26/20 0107  AMMONIA 15   Coagulation Profile: No results for input(s): INR, PROTIME in the last 168 hours. Cardiac Enzymes: No results for input(s): CKTOTAL, CKMB, CKMBINDEX, TROPONINI in the last 168 hours.  BNP (last 3 results) No results for input(s): PROBNP in the last 8760 hours. HbA1C: No results for input(s): HGBA1C in the last 72 hours. CBG: No results for input(s): GLUCAP in the last 168 hours.  Lipid Profile: No results for input(s): CHOL, HDL, LDLCALC, TRIG, CHOLHDL, LDLDIRECT in the last 72 hours. Thyroid Function Tests: No results for input(s): TSH, T4TOTAL, FREET4, T3FREE, THYROIDAB in the last 72 hours. Anemia Panel: Recent Labs    12/26/20 0107  VITAMINB12 1,026*  FOLATE 30.1   Sepsis Labs: No results for input(s): PROCALCITON, LATICACIDVEN in the last 168 hours.  Recent Results (from the past 240 hour(s))  Urine Culture     Status: None   Collection Time: 12/22/20 10:52 AM   Specimen: Urine, Catheterized  Result Value Ref Range Status   Specimen Description URINE, CATHETERIZED  Final   Special Requests NONE  Final   Culture   Final    NO GROWTH Performed at [facility], [address] [place], [place] [address]     Report Status 12/23/2020 FINAL  Final  SARS CORONAVIRUS 2 (TAT 6-24 HRS) Nasopharyngeal Nasopharyngeal Swab     Status: None   Collection Time: 12/27/20  4:59 PM   Specimen: Nasopharyngeal Swab  Result Value Ref Range Status   SARS Coronavirus 2 NEGATIVE NEGATIVE Final    Comment: (NOTE) SARS-CoV-2 target nucleic acids are NOT DETECTED.  The SARS-CoV-2 RNA is generally detectable in upper and lower respiratory specimens during the acute phase of infection. Negative results do not preclude SARS-CoV-2 infection, do not rule out co-infections with other pathogens, and should not be used as the sole basis for treatment or other patient management decisions. Negative results must be combined with clinical observations, patient history, and epidemiological information. The expected result is Negative.  Fact Sheet for Patients: HairSlick.no  Fact Sheet for Healthcare Providers: quierodirigir.com  This test is not yet approved or cleared by the [place] FDA and  has been authorized for detection and/or diagnosis of SARS-CoV-2 by FDA under an Emergency Use Authorization (EUA). This EUA will remain  in effect (meaning this test can be used) for the duration of the COVID-19 declaration under Se ction 564(b)(1) of the Act, 21 U.S.C. section 360bbb-3(b)(1), unless the authorization is terminated or revoked sooner.  Performed at [facility], [address] [place], [place] [address]       Radiology Studies: No results found.   LOS: 14 days   Lanae Boast, MD [facility]  12/28/2020, 3:52 PM

## 2020-12-28 NOTE — Consult Note (Signed)
Palliative Medicine Inpatient Consult Note  Consulting Provider: Lanae Boast, MD  Reason for consult:   Palliative Care Consult Services Palliative Medicine Consult  Reason for Consult? GOC, mentation waxing and waning.    HPI:  Per intake H&P --> 87 year old male with hypertension chronic indwelling Foley catheter, nephrolithiasis, PE on Xarelto, BPH recently hospitalized 8/28-8/31 for severe sepsis due to catheter associated urinary tract infection with hydronephrosis secondary to right UVJ calculus, had a stent placed culture grew Citrobacter friendly and discharged on complete course of ciprofloxacin.  Palliative care has been asked to get involved to discuss goals of care in the setting of chronic comorbidities and multiple hospitalizations.  Clinical Assessment/Goals of Care:  *Please note that this is a verbal dictation therefore any spelling or grammatical errors are due to the "Dragon Medical One" system interpretation.  I have reviewed medical records including EPIC notes, labs and imaging, received report from bedside RN, assessed the patient who is sitting up in bed eating a spaghetti lunch with his nursing technician.    I called patient's daughter Minerva Areola to further discuss diagnosis prognosis, GOC, EOL wishes, disposition and options.  A brief review of Waln chronic medical conditions was had inclusive of his history of pulmonary embolism, BPH requiring an indwelling Foley catheter, and hypertension.  We reviewed that Gyasi has had a multitude of health ailments associated with his kidneys inclusive of hydronephrosis requiring stent placement.   I introduced Palliative Medicine as specialized medical care for people living with serious illness. It focuses on providing relief from the symptoms and stress of a serious illness. The goal is to improve quality of life for both the patient and the family.  [name] shares with me that Mia is from [place], [place].  He is a widower.  He has never had any children himself though he considers his stepdaughter [name] to be his own.  He has a living sister.  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  He is a man who valued his independence.  He is a man of faith and practices within the [employer] denomination.  Prior to August 28 of this year Arlyn was fully functional and living alone in his home where he performed all basic activities of daily living and instrumental activities of daily living  A detailed discussion was had today regarding advanced directives - Won does not have these on file.    Concepts specific to code status, artifical feeding and hydration, continued IV antibiotics and rehospitalization was had.  Per CODE STATUS reviewed Debbie and I discussed that in the past he had been a DO NOT RESUSCITATE.  [name] shares that she does not believe Jessen would ever want prolonged heroic measures or to sustain life via life support.  She had shared with me that she thinks he should be DO NOT RESUSCITATE/DO NOT INTUBATE CODE STATUS as these would be his wishes.  A review of [facility] associated complications was had inclusive of his delirium which has been ongoing and quite distressing to [place].  We reviewed that in geriatric patients with any known history of cognitive impairment that they are at extremely high risk of an acute complicated delirium during a prolonged hospitalization.  We reviewed that there are a multitude of contributing factors inclusive of differences in location, differences in circadian rhythm, active infection, and being exposed to faces you have never seen before.  I shared that often times we  need to allow a great deal of time to pass before we can identify the new baseline which the patient will have from a cognitive perspective.  [name] is in favor of someone checking in with Molly Maduro upon discharge.  She inquires  as to when discharge will happen and I shared that this will be up to the primary medical team.  Discussed the importance of continued conversation with family and their  medical providers regarding overall plan of care and treatment options, ensuring decisions are within the context of the patients values and GOCs.  Decision Maker: Zena Amos (step-daughter): [phone]  SUMMARY OF RECOMMENDATIONS   DNAR/DNI  Will place gold DNR on the front of the chart  Appreciate Dr. Jonathon Bellows providing patient's stepdaughter comprehensive medical update as  Chaplain visit patient is [facility] for outpatient palliative support  Incremental palliative support while inpatient  Code Status/Advance Care Planning: DNAR/DNI   Palliative Prophylaxis:  Oral care, mobility, delirium precautions  Additional Recommendations (Limitations, Scope, Preferences): Continue to treat what is treatable  Psycho-social/Spiritual:  Desire for further Chaplaincy support: Yes   Additional Recommendations: Education on delirium management and comorbidities   Prognosis: Prognosis is unclear as patient has had 2 hospitalizations in the last 6 months and 1 ED visit.  For the last month he has had a declining functional state and is identified as severely frail at this juncture.  He is at a high risk for 82-month mortality.  Discharge Planning: Discharge to a [facility] skilled nursing when medically optimized with outpatient palliative support  Vitals:   12/28/20 0954 12/28/20 1018  BP: 116/77 97/72  Pulse: 76 90  Resp: 18   Temp:    SpO2: (!) 84% 98%    Intake/Output Summary (Last 24 hours) at 12/28/2020 1154 Last data filed at 12/28/2020 8185 Gross per 24 hour  Intake 520 ml  Output 1100 ml  Net -580 ml   Last Weight  Most recent update: 12/25/2020 10:14 PM    Weight  57 kg (125 lb 10.6 oz)            Gen: Frail elderly Caucasian male in no acute distress HEENT: moist mucous membranes CV: Regular  rate and rhythm PULM: clear to auscultation bilaterally  ABD: soft/nontender  EXT: No edema  Neuro: Alert and oriented x1  PPS: 40%   This conversation/these recommendations were discussed with patient primary care team, Dr. Jonathon Bellows  Time In:  1130 Time Out: 1240 Total Time: 70 Greater than 50%  of this time was spent counseling and coordinating care related to the above assessment and plan.  Lamarr Lulas [place] Palliative Medicine Team Team Cell Phone: [phone] Please utilize secure chat with additional questions, if there is no response within 30 minutes please call the above phone number  Palliative Medicine Team providers are available by phone from 7am to 7pm daily and can be reached through the team cell phone.  Should this patient require assistance outside of these hours, please call the patient's attending physician.

## 2020-12-28 NOTE — Progress Notes (Signed)
Initial Nutrition Assessment  DOCUMENTATION CODES:   Underweight, Severe malnutrition in context of chronic illness  INTERVENTION:   Ensure Enlive po BID, each supplement provides 350 kcal and 20 grams of protein  MVI with Minerals  NUTRITION DIAGNOSIS:   Severe Malnutrition related to chronic illness as evidenced by severe fat depletion, severe muscle depletion.  GOAL:   Patient will meet greater than or equal to 90% of their needs   MONITOR:   PO intake, Supplement acceptance, Labs, Weight trends  REASON FOR ASSESSMENT:   Malnutrition Screening Tool    ASSESSMENT:   87 yo male admitted with acute metabolic encephalopathy with agitation/hallucination, unsteady gait/falls at home. PMH includes HTN  Pt mental status waxes and wanes. RN at bedside; pt confused currently, speech is mumbled. Unable to obtain diet and weight history.   Recorded po intake 0-100% of meals.  Pt wearing top dentures but not bottom dentures per RN. Currently on Dysphagia 3, easy to chew and tolerating well per RN  Current wt 57 kg; weight of 66 kg a few weeks ago. 13.6% wt loss  Labs: sodium 146 (H), Creatinine 1.42 Meds: 1/2 NS at 50 ml/hr, acidophilus, thiamine   NUTRITION - FOCUSED PHYSICAL EXAM:  Flowsheet Row Most Recent Value  Orbital Region Severe depletion  Upper Arm Region Severe depletion  Thoracic and Lumbar Region Severe depletion  Buccal Region Severe depletion  Temple Region Severe depletion  Clavicle Bone Region Severe depletion  Clavicle and Acromion Bone Region Severe depletion  Scapular Bone Region Severe depletion  Dorsal Hand Severe depletion  Patellar Region Severe depletion  Anterior Thigh Region Severe depletion  Posterior Calf Region Severe depletion       Diet Order:   Diet Order             DIET DYS 3 Room service appropriate? Yes; Fluid consistency: Thin  Diet effective now                   EDUCATION NEEDS:   Not appropriate for  education at this time  Skin:  Skin Assessment: Skin Integrity Issues: Skin Integrity Issues:: Stage III Stage III: coccyx  Last BM:  9/20  Height:   Ht Readings from Last 1 Encounters:  12/10/20 5\' 9"  (1.753 m)    Weight:   Wt Readings from Last 1 Encounters:  12/25/20 57 kg     BMI:  Body mass index is 18.56 kg/m.  Estimated Nutritional Needs:   Kcal:  1800-2000 kcals  Protein:  90-105 g  Fluid:  >/= 1.8 L   12/27/20 MS, RDN, LDN, CNSC Registered Dietitian III Clinical Nutrition RD Pager and On-Call Pager Number Located in [place]

## 2020-12-28 NOTE — Progress Notes (Signed)
Palliative Medicine RN Note: Consult order noted for "GOC, mentation waxing and waning."  During chart review, I see a pending d/c summary from Dr Jonathon Bellows, as well as a note from [facility] dated 9/20 that d/c is planned for today. If [name] is ready for discharge, we recommend an outpatient palliative care referral to discuss continued GOC. To set up OP PC at a facility, pt will need a TOC order to set it up, as well as inclusion of that order on the d/c summary and d/c orders.  If [name] is not discharged when we have an available provider, our team will be happy to see him.  Corey Chance Kingdom Vanzanten, RN, BSN, [facility] Palliative Medicine Team 12/28/2020 10:57 AM Office [phone]

## 2020-12-29 DIAGNOSIS — G9341 Metabolic encephalopathy: Secondary | ICD-10-CM | POA: Diagnosis not present

## 2020-12-29 DIAGNOSIS — E43 Unspecified severe protein-calorie malnutrition: Secondary | ICD-10-CM | POA: Insufficient documentation

## 2020-12-29 MED ORDER — OLANZAPINE 5 MG PO TBDP: 2.5000 mg | ORAL_TABLET | Freq: Every evening | ORAL | Status: DC | PRN

## 2020-12-29 MED ORDER — ENSURE ENLIVE PO LIQD: 237.0000 mL | Freq: Two times a day (BID) | ORAL | 12 refills | Status: DC

## 2020-12-29 MED ORDER — THIAMINE HCL 100 MG PO TABS: 100.0000 mg | ORAL_TABLET | Freq: Every day | ORAL | 0 refills | Status: AC

## 2020-12-29 MED ORDER — ADULT MULTIVITAMIN W/MINERALS CH: 1.0000 | ORAL_TABLET | Freq: Every day | ORAL | Status: DC

## 2020-12-29 NOTE — Discharge Summary (Signed)
Physician Discharge Summary  Corey Cummings ZOX:[id number] DOB: [birth year] DOA: 12/14/2020  PCP: [name], MD  Admit date: 12/14/2020 Discharge date: 12/29/2020  Admitted From: home Disposition:  SNF  Recommendations for Outpatient Follow-up:  Follow up with PCP in 1-2 weeks [facility] 1 week to address the foley catheter Please obtain BMP/CBC in one week  Home Health:no  Equipment/Devices: none  Discharge Condition: Stable Code Status:   Code Status: DNR Diet recommendation:  Diet Order             DIET DYS 3 Room service appropriate? Yes; Fluid consistency: Thin  Diet effective now                    Brief/Interim Summary: 87 year old male with hypertension chronic indwelling Foley catheter, nephrolithiasis, PE on Xarelto, BPH recently hospitalized 8/28-8/31 for severe sepsis due to catheter associated urinary tract infection with hydronephrosis secondary to right UVJ calculus, had a stent placed culture grew Citrobacter friendly and discharged on complete course of ciprofloxacin.  Since discharge patient has been unsteady on his feet, was seen in the ED 9/3 after he took out his Foley catheter.  He has been acting weird confused hallucinating at times agitated, has had falls onset on Labor day per daughter.    In the ED CT brain no acute finding, afebrile, labs showed hemoglobin 8.9 g potassium 3.1 creatinine 2.3 UA large leukocyte many bacteria more than 50 RBC multiple WBC chest x-ray and pelvis x-ray no acute finding, renal CT interval placement of right double-J stent with tip coiled in the collecting system and distal ureter adjacent to the large UVJ stone which is unchanged in position. Previously seen severe right hydronephrosis has markedly improved. 2. Small locule of air remains in the patulous right collecting system which may be related to interval intervention. Persistent infection is not entirely excluded by imaging, though previously seen  perinephric and periureteral stranding has significantly improved. 3. Significantly improved left hydronephrosis with only mild residual dilation of the collecting system. 4. Unchanged horseshoe morphology"    Cultures obtained, placed on antibiotics and admitted with one-to-one, IV Ativan pnr.  Ciprofloxacin was discontinued as it could have contributed to his confusion/hallucination.   Patient managed IV fluids, IV antibiotics supportive care and mental status improved relatively.Renal function improved but was weak deconditioned - was waiting for insurance approval for skilled nursing facility.   But he was found to be more confused and also retaining urine foley catheter exchanged 12/21/20 night Seen by urology advised outpatient follow-up.  Patient completed cefepime, and on Diflucan. Waxing and waning mental status but now improved, cont on seroquel bedtime.   Remains weak and deconditioned. Mentation appears a stable he was alert awake oriented to current hospital, current president current month or year although with lack of denture in the morning speech becomes wobbly  Discharge Diagnoses:  Acute metabolic encephalopathy with agitation/hallucination, unsteady gait falls at home: CT head nonacute on admission and nonfocal.  multifactorial in the setting of UTI,ciprofloxacin use, deconditioning, old age, .Mental status waxing and waning-this morning appears more confused. Did not tolerate MRI on last Sunday so canceled.continue supportive care fall precaution delirium precaution.  On Seroquel bedtime discontinued Zofran As Needed Bedtime.  Continue Thiamine on Discharge    Falls at home/Physical deconditioning:X-ray no acute fractures.PT/OT to continue.  He is unsafe to discharge home.  Will need skilled nursing facility placement on D/C.   Complicated catheter associated UTI/pyelonephritis Chronic indwelling Foley in place w/  BPH Right double-J stent placed on last admission in  August Bladder stone (formed R UVJ stone) Candida UTI-yeast in urine Urine culture with yeast.S/p completed cefepime.repeat urine culture 9/15 negative.   CT abdomen as above-significantly improved left hydronephrosis, markedly improved previous right severe hydronephrosis, small locule of air in patulous right collecting system may be related to interval intervention, persistent infection is not entirely excluded.Seen by Dr. Cardell Peach from urology this admission -advised to follow-up with urology next week to remove the right ureteral stent, continue catheter in place continue proscar finasteride and complete Diflucan through 12/29/20.  Last urine culture from 9/15 was negative   AKI on CKD stage IIIa Baseline creat~1.3 .we will repeat labs this morning.  Creatinine 1.5 check CBC BMP in 1 week. Hypomagnesemia/Hypokalemia:resolved. Hyponatremia  s/p ivf 0.45 ns overnight Hypertension: Controlled continue amlodipine.discontinued his losartan.   Anemia of chronic disease.  HB stable at 9.5 g.  History of DVT/PE on anticoagulation: Age indeterminate DVT in the left popliteal vein as well as acute PE in November 21, left leg DVT again seen on Doppler ultrasound on 12/05/2020.  Resume his Xarelto, is on lovenox   AAA, known to have dilation of thoracic aortic arch 3.3 centimeter in November/21, outpatient follow-up and surveillance will be needed   Inguinal hernia noted on CT scan with a small loops of bowel without signs of obstruction.  Outpatient follow-up with surgery.  Monitor bowel movement   Recent weight loss Severe protein calorie mlanutrition, BMI is at 18 Failure to thrive:  wt:67 kg on 8/29 and this admission 57 kg. augment nutritional status dietitian has been consulted.     Goals of care:given his multiple comorbidities and ongoing encephalopathy does not appear bright.  Consulted with palliative medicine.After palliative care meeting patient has been changed to DNR overall prognosis does  not appear bright again.  He seems to have failure to thrive debilitated with severe protein calorie malnutrition advanced age is unsuspecting background dementia  Pressure Ulcer: Pressure Injury 12/23/20 Coccyx Medial Stage 3 -  Full thickness tissue loss. Subcutaneous fat may be visible but bone, tendon or muscle are NOT exposed. (Active)  12/23/20 2020  Location: Coccyx  Location Orientation: Medial  Staging: Stage 3 -  Full thickness tissue loss. Subcutaneous fat may be visible but bone, tendon or muscle are NOT exposed.  Wound Description (Comments):   Present on Admission:     Consults: TOC  Subjective: Mentation appears a stable he was alert awake oriented to current hospital, current president current month or year although with lack of denture in the morning speech becomes wobbly Discharge Exam: Vitals:   12/29/20 0446 12/29/20 0852  BP: 111/72 113/72  Pulse: 88 95  Resp: 18 17  Temp: 98 F (36.7 C) 98.3 F (36.8 C)  SpO2: 96% 96%   General: Pt is alert, awake, and commands appropriately able to move all his extremities not in acute distress Cardiovascular: RRR, S1/S2 +, no rubs, no gallops Respiratory: CTA bilaterally, no wheezing, no rhonchi Abdominal: Soft, NT, ND, bowel sounds + Extremities: no edema, no cyanosis Foley catheter in place  Discharge Instructions  Discharge Instructions     Discharge instructions   Complete by: As directed    Follow-up with urology in 1 weeks  for the stent and Foley catheter removal.-[facility] office  Check CBC/BMP in 1 week  Please call call MD or return to ER for similar or worsening recurring problem that brought you to hospital or if any fever,nausea/vomiting,abdominal  pain, uncontrolled pain, chest pain,  shortness of breath or any other alarming symptoms.  Please follow-up your doctor as instructed in a week time and call the office for appointment.  Please avoid alcohol, smoking, or any other illicit  substance and maintain healthy habits including taking your regular medications as prescribed.  You were cared for by a hospitalist during your hospital stay. If you have any questions about your discharge medications or the care you received while you were in the hospital after you are discharged, you can call the unit and ask to speak with the hospitalist on call if the hospitalist that took care of you is not available.  Once you are discharged, your primary care physician will handle any further medical issues. Please note that NO REFILLS for any discharge medications will be authorized once you are discharged, as it is imperative that you return to your primary care physician (or establish a relationship with a primary care physician if you do not have one) for your aftercare needs so that they can reassess your need for medications and monitor your lab values   Increase activity slowly   Complete by: As directed       Allergies as of 12/29/2020   No Known Allergies      Medication List     STOP taking these medications    ciprofloxacin 500 MG tablet Commonly known as: CIPRO   olmesartan 20 MG tablet Commonly known as: BENICAR       TAKE these medications    acidophilus Caps capsule Take 1 capsule by mouth daily.   amLODipine 5 MG tablet Commonly known as: NORVASC Take 1 tablet (5 mg total) by mouth daily.   feeding supplement Liqd Take 237 mLs by mouth 2 (two) times daily between meals.   finasteride 5 MG tablet Commonly known as: PROSCAR Take 5 mg by mouth at bedtime.   Fusion Plus Caps Take 1 capsule by mouth daily.   multivitamin with minerals Tabs tablet Take 1 tablet by mouth daily.   OLANZapine zydis 5 MG disintegrating tablet Commonly known as: ZYPREXA Take 0.5 tablets (2.5 mg total) by mouth at bedtime as needed (agitation).   rivaroxaban 20 MG Tabs tablet Commonly known as: XARELTO Take 20 mg by mouth daily with supper.   tamsulosin 0.4 MG Caps  capsule Commonly known as: FLOMAX Take 0.4 mg by mouth daily.   thiamine 100 MG tablet Take 1 tablet (100 mg total) by mouth daily.        Contact information for follow-up providers     [name], MD Follow up in 1 week(s).   Specialty: Family Medicine Contact information: [address] [place] [address] [phone]         Jannifer Hick, MD Follow up in 2 week(s).   Specialty: Urology Contact information: [address] [place] [address] [phone]              Contact information for after-discharge care     Destination     [facility] Preferred SNF .   Service: Skilled Nursing Contact information: [address] [place] [address] [phone]                    No Known Allergies  The results of significant diagnostics from this hospitalization (including imaging, microbiology, ancillary and laboratory) are listed below for reference.    Microbiology: Recent Results (from the past 240 hour(s))  Urine Culture     Status: None   Collection Time: 12/22/20 10:52 AM   Specimen: Urine, Catheterized  Result Value Ref Range Status   Specimen Description URINE, CATHETERIZED  Final   Special Requests NONE  Final   Culture   Final    NO GROWTH Performed at [facility], [address], [place], [place] [address]    Report Status 12/23/2020 FINAL  Final  SARS CORONAVIRUS 2 (TAT 6-24 HRS) Nasopharyngeal Nasopharyngeal Swab     Status: None   Collection Time: 12/27/20  4:59 PM   Specimen: Nasopharyngeal Swab  Result Value Ref Range Status   SARS Coronavirus 2 NEGATIVE NEGATIVE Final    Comment: (NOTE) SARS-CoV-2 target nucleic acids are NOT DETECTED.  The SARS-CoV-2 RNA is generally detectable in upper and lower respiratory specimens during the acute phase of infection. Negative results do not preclude SARS-CoV-2 infection, do not rule out co-infections with  other pathogens, and should not be used as the sole basis for treatment or other patient management decisions. Negative results must be combined with clinical observations, patient history, and epidemiological information. The expected result is Negative.  Fact Sheet for Patients: HairSlick.no  Fact Sheet for Healthcare Providers: quierodirigir.com  This test is not yet approved or cleared by the [place] FDA and  has been authorized for detection and/or diagnosis of SARS-CoV-2 by FDA under an Emergency Use Authorization (EUA). This EUA will remain  in effect (meaning this test can be used) for the duration of the COVID-19 declaration under Se ction 564(b)(1) of the Act, 21 U.S.C. section 360bbb-3(b)(1), unless the authorization is terminated or revoked sooner.  Performed at [facility], [address] [place], [place] [address]     Procedures/Studies:   Labs: BNP (last 3 results) Recent Labs    02/24/20 1223  BNP 285.5*   Basic Metabolic Panel: Recent Labs  Lab 12/23/20 0357 12/26/20 0107 12/27/20 0514 12/28/20 1039  NA 143 146* 145 146*  K 3.8 3.4* 3.5 3.7  CL 104 111 110 112*  CO2 21* 23 22 22   GLUCOSE 88 101* 95 77  BUN 35* 42* 33* 28*  CREATININE 1.48* 1.50* 1.26* 1.42*  CALCIUM 9.3 8.5* 8.6* 8.7*   Liver Function Tests: No results for input(s): AST, ALT, ALKPHOS, BILITOT, PROT, ALBUMIN in the last 168 hours. No results for input(s): LIPASE, AMYLASE in the last 168 hours. Recent Labs  Lab 12/26/20 0107  AMMONIA 15   CBC: Recent Labs  Lab 12/27/20 0514 12/28/20 1039  WBC 5.4 6.4  HGB 9.0* 9.5*  HCT 30.2* 31.4*  MCV 85.1 85.3  PLT 277 294   Cardiac Enzymes: No results for input(s): CKTOTAL, CKMB, CKMBINDEX, TROPONINI in the last 168 hours. BNP: Invalid input(s): POCBNP CBG: No results for input(s): GLUCAP in the last 168 hours. D-Dimer No results for input(s): DDIMER in the  last 72 hours. Hgb A1c No results for input(s): HGBA1C in the last 72 hours. Lipid Profile No results for input(s): CHOL, HDL, LDLCALC, TRIG, CHOLHDL, LDLDIRECT in the last 72 hours. Thyroid function studies No results for input(s): TSH, T4TOTAL, T3FREE, THYROIDAB in the last 72 hours.  Invalid input(s): FREET3 Anemia work up No results for input(s): VITAMINB12, FOLATE, FERRITIN, TIBC, IRON, RETICCTPCT in the last 72 hours. Urinalysis    Component Value Date/Time   COLORURINE AMBER (A) 12/22/2020 1052   APPEARANCEUR CLOUDY (A) 12/22/2020 1052   LABSPEC 1.011 12/22/2020 1052   PHURINE 6.0 12/22/2020 1052  GLUCOSEU NEGATIVE 12/22/2020 1052   HGBUR LARGE (A) 12/22/2020 1052   BILIRUBINUR NEGATIVE 12/22/2020 1052   KETONESUR 5 (A) 12/22/2020 1052   PROTEINUR 100 (A) 12/22/2020 1052   NITRITE NEGATIVE 12/22/2020 1052   LEUKOCYTESUR LARGE (A) 12/22/2020 1052   Sepsis Labs Invalid input(s): PROCALCITONIN,  WBC,  LACTICIDVEN Microbiology Recent Results (from the past 240 hour(s))  Urine Culture     Status: None   Collection Time: 12/22/20 10:52 AM   Specimen: Urine, Catheterized  Result Value Ref Range Status   Specimen Description URINE, CATHETERIZED  Final   Special Requests NONE  Final   Culture   Final    NO GROWTH Performed at [facility], [address] [place], [place] [address]    Report Status 12/23/2020 FINAL  Final  SARS CORONAVIRUS 2 (TAT 6-24 HRS) Nasopharyngeal Nasopharyngeal Swab     Status: None   Collection Time: 12/27/20  4:59 PM   Specimen: Nasopharyngeal Swab  Result Value Ref Range Status   SARS Coronavirus 2 NEGATIVE NEGATIVE Final    Comment: (NOTE) SARS-CoV-2 target nucleic acids are NOT DETECTED.  The SARS-CoV-2 RNA is generally detectable in upper and lower respiratory specimens during the acute phase of infection. Negative results do not preclude SARS-CoV-2 infection, do not rule out co-infections with other pathogens, and should not be  used as the sole basis for treatment or other patient management decisions. Negative results must be combined with clinical observations, patient history, and epidemiological information. The expected result is Negative.  Fact Sheet for Patients: HairSlick.no  Fact Sheet for Healthcare Providers: quierodirigir.com  This test is not yet approved or cleared by the [place] FDA and  has been authorized for detection and/or diagnosis of SARS-CoV-2 by FDA under an Emergency Use Authorization (EUA). This EUA will remain  in effect (meaning this test can be used) for the duration of the COVID-19 declaration under Se ction 564(b)(1) of the Act, 21 U.S.C. section 360bbb-3(b)(1), unless the authorization is terminated or revoked sooner.  Performed at [facility], [address], [place], [place] [address]      Time coordinating discharge: 35 minutes  SIGNED: Lanae Boast, MD  [facility] 12/29/2020, 9:52 AM  If 7PM-7AM, please contact night-coverage [email or web address]

## 2020-12-29 NOTE — Plan of Care (Signed)
  Problem: Clinical Measurements: Goal: Ability to maintain clinical measurements within normal limits will improve Outcome: Progressing   Problem: Activity: Goal: Risk for activity intolerance will decrease Outcome: Progressing   Problem: Skin Integrity: Goal: Risk for impaired skin integrity will decrease Outcome: Progressing   

## 2020-12-29 NOTE — Progress Notes (Signed)
DISCHARGE NOTE [name] to be discharged Skilled nursing facility per MD order. Patient verbalized understanding.  Skin clean, dry and intact without evidence of skin break down, no evidence of skin tears noted. IV catheter discontinued intact. Site without signs and symptoms of complications. Dressing and pressure applied. Pt denies pain at the site currently. No complaints noted.  Patient free of lines, drains, and wounds. Patient left with chronic foley cath in place  Discharge packet assembled. An After Visit Summary (AVS) was printed and given to the EMS personnel. Patient escorted via stretcher and discharged to [facility] via ambulance. Report called to accepting facility; all questions and concerns addressed.   Katrina Stack, RN

## 2020-12-29 NOTE — Plan of Care (Signed)
  Problem: Health Behavior/Discharge Planning: Goal: Ability to manage health-related needs will improve Outcome: Adequate for Discharge   Problem: Clinical Measurements: Goal: Ability to maintain clinical measurements within normal limits will improve 12/29/2020 1233 by Katrina Stack, RN Outcome: Adequate for Discharge 12/29/2020 1232 by Katrina Stack, RN Outcome: Progressing Goal: Will remain free from infection Outcome: Adequate for Discharge Goal: Diagnostic test results will improve Outcome: Adequate for Discharge Goal: Cardiovascular complication will be avoided Outcome: Adequate for Discharge   Problem: Activity: Goal: Risk for activity intolerance will decrease 12/29/2020 1233 by Katrina Stack, RN Outcome: Adequate for Discharge 12/29/2020 1232 by Katrina Stack, RN Outcome: Progressing   Problem: Nutrition: Goal: Adequate nutrition will be maintained Outcome: Adequate for Discharge   Problem: Coping: Goal: Level of anxiety will decrease Outcome: Adequate for Discharge   Problem: Elimination: Goal: Will not experience complications related to bowel motility Outcome: Adequate for Discharge Goal: Will not experience complications related to urinary retention Outcome: Adequate for Discharge   Problem: Safety: Goal: Ability to remain free from injury will improve Outcome: Adequate for Discharge   Problem: Skin Integrity: Goal: Risk for impaired skin integrity will decrease 12/29/2020 1233 by Katrina Stack, RN Outcome: Adequate for Discharge 12/29/2020 1232 by Katrina Stack, RN Outcome: Progressing

## 2020-12-29 NOTE — TOC Transition Note (Addendum)
Transition of Care (TOC) - CM/SW Discharge Note *Discharged to [facility] *Room number 110 *Number for Report: [phone]    Patient Details  Name: Corey Cummings MRN: [id number] Date of Birth: 1930-07-24  Transition of Care [facility]) CM/SW Contact:  Cristobal Goldmann, LCSW Phone Number: 12/29/2020, 12:07 PM   Clinical Narrative:  Patient discharged to [facility] for short-term rehab. CSW informed on 9/20 by admissions director that insurance auth received. Patient daughter Minerva Areola [phone]) and Sister Alesia Richards [phone]) informed of discharge. Clinicals transmitted to facility and CSW provided with information for nurse to call report )room 110).  3:01 pm: Contacted sister-in-law Corrie Dandy and informed her that ambulance transport was at hospital to take patient to facility.     Final next level of care: Skilled Nursing Facility Barriers to Discharge: Barriers Resolved   Patient Goals and CMS Choice Patient states their goals for this hospitalization and ongoing recovery are:: Patient's stet-ddaughter Minerva Areola in agreement with ST rehab CMS Medicare.gov Compare Post Acute Care list provided to:: Patient Represenative (must comment) (Medicare.gov list placed in room for patient's daughter) Choice offered to / list presented to : Adult Children  Discharge Placement PASRR number recieved: 12/16/20            Patient chooses bed at: Other - please specify in the comment section below: ([facility]) Patient to be transferred to facility by: Non-emergency ambulance transport Name of family member notified: Daughter Minerva Areola - [phone] and Nathen May  - [phone] Patient and family notified of of transfer: 12/29/20  Discharge Plan and Services In-house Referral: Clinical Social Work   Post Acute Care Choice: Skilled Nursing Facility                             Social Determinants of Health (SDOH)  Interventions  No SDOH interventions requested or needed at discharge   Readmission Risk Interventions No flowsheet data found.

## 2020-12-29 NOTE — Progress Notes (Signed)
Physician Discharge Summary  Reilly Blades KDT:[id number] DOB: 08/04/1930 DOA: 12/14/2020  PCP: [name], MD  Admit date: 12/14/2020 Discharge date: 12/29/2020  Admitted From: home Disposition:  SNF  Recommendations for Outpatient Follow-up:  Follow up with PCP in 1-2 weeks Follow-up with urology in 1 week Please obtain BMP/CBC in one week   Home Health:NO  Equipment/Devices: NONE  Discharge Condition:Stable Code Status:   Code Status: DNR Diet recommendation:  Diet Order             DIET DYS 3 Room service appropriate? Yes; Fluid consistency: Thin  Diet effective now                 Brief/Interim Summary: 87 year old male with hypertension chronic indwelling Foley catheter, nephrolithiasis, PE on Xarelto, BPH recently hospitalized 8/8-/1931 for severe sepsis due to catheter associated urinary tract infection with hydronephrosis secondary to right UVJ calculus, had a stent placed culture grew Citrobacter friendly and discharged on complete course of ciprofloxacin.  Since discharge patient has been unsteady on his feet, was seen in the ED 9/3 after he took out his Foley catheter.  He has been acting weird confused hallucinating at times agitated, has had falls. In the ED CT brain no acute finding, afebrile, labs showed hemoglobin 8.9 g potassium 3.1 creatinine 2.3 UA large leukocyte many bacteria more than 50 RBC multiple WBC chest x-ray and pelvis x-ray no acute finding, renal CT interval placement of right double-J stent with tip coiled in the collecting system and distal ureter adjacent to the large UVJ stone which is unchanged in position. Previously seen severe right hydronephrosis has markedly improved. 2. Small locule of air remains in the patulous right collecting system which may be related to interval intervention. Persistent infection is not entirely excluded by imaging, though previously seen perinephric and periureteral stranding has significantly improved. 3.  Significantly improved left hydronephrosis with only mild residual dilation of the collecting system. 4. Unchanged horseshoe morphology"  Cultures obtained, placed on antibiotics and admitted with one-to-one, IV Ativan as needed. Patient is managed IV fluids, IV antibiotics supportive care and mental status improved. Creatinine has nicely improved tolerating p.o., remains weak and deconditioned and is planning for PT OT and  skilled nursing tfacility.addendum- he did not get discharged= labeled as progress note  Discharge Diagnoses:   Acute metabolic encephalopathy with agitation/hallucination, unsteady gait falls at home: CT head nonacute on admission and nonfocal.  Likely multifactorial in the setting of infection, could be due to ciprofloxacin use.  Mental status nicely improved, doing well with bedtime Seroquel and would like to just stay on it.  Continue supportive care PT OT and SNF placement  Complicated catheter associated UTI/pyelonephritis BPH Chronic indwelling Foley in place Right double-J stent placed on last admission in August Large UVJ stone unchanged in position: Candi UTI-yeast in urine on last culture had Citrobacter frunedii.  Cultures were resistant to cefazolin, ceftriaxone, and nitrofurantoin.  Urine culture with yeast, added Diflucan-completed cefepime.Will need to follow-up with urology as outpatient.  Repeat CT abdomen as above-significantly improved left hydronephrosis, markedly improved previous right severe hydronephrosis, small locule of air in patulous right collecting system may be related to interval intervention, persistent infection is not entirely excluded. Cont  Proscar finasteride .  Continue diflucan 200 mg daily  x 14 days- stop on 12/29/20 .  AKI on CKD stage IIIa Baseline creatinine on last discharge 1.3.  Nicely improved o baseline.  OFF IV fluids.   Recent Labs    12/16/20  0422 12/17/20 0431 12/18/20 0434 12/19/20 1639 12/21/20 0404 12/22/20 0512  12/23/20 0357 12/26/20 0107 12/27/20 0514 12/28/20 1039  BUN 33* 28* 22 23 27* 34* 35* 42* 33* 28*  CREATININE 2.00* 1.69* 1.42* 1.55* 1.48* 1.65* 1.48* 1.50* 1.26* 1.42*   Hypomagnesemia/Hypokalemia: Resolved Recent Labs  Lab 12/23/20 0357 12/26/20 0107 12/27/20 0514 12/28/20 1039  K 3.8 3.4* 3.5 3.7    Gait disturbance/falls at home: X-ray no acute fractures.  PT OT to cont and has recommended skilled nursing facility.    Hypertension: On amlodipine and losartan at home.  Blood pressure borderline controlled continue amlodipine.Holding ARB due to AKI-consider resuming if blood pressure is uncontrolled.  Anemia: Stable, likely from chronic disease.  Continue to monitor. Recent Labs  Lab 12/27/20 0514 12/28/20 1039  HGB 9.0* 9.5*  HCT 30.2* 31.4*   History of DVT/PE on anticoagulation: Age indeterminate DVT in the left popliteal vein as well as acute PE in November/21, left leg DVT again seen on Doppler ultrasound on 12/05/2020.  Cont his DOAC  AAA, known to have dilation of thoracic aortic arch 3.3 centimeter in November/21, outpatient follow-up and surveillance will be needed  Inguinal hernia noted on CT scan with a small loops of bowel without signs of obstruction.Outpatient follow-up with surgery.  Monitor bowel movement.  Consults: CM  Subjective: Alert awake, oriented at baseline, eating well.  Ready for skilled nursing facility  Discharge Exam: Vitals:   12/29/20 0446 12/29/20 0852  BP: 111/72 113/72  Pulse: 88 95  Resp: 18 17  Temp: 98 F (36.7 C) 98.3 F (36.8 C)  SpO2: 96% 96%   General: Pt is alert, awake, not in acute distress Cardiovascular: RRR, S1/S2 +, no rubs, no gallops Respiratory: CTA bilaterally, no wheezing, no rhonchi Abdominal: Soft, NT, ND, bowel sounds + Extremities: no edema, no cyanosis  Discharge Instructions  Discharge Instructions     Discharge instructions   Complete by: As directed    Follow-up with urology in 1 weeks   for the stent and Foley catheter removal.-Call [facility]  Check CBC/BMP in 1 week  Please call call MD or return to ER for similar or worsening recurring problem that brought you to hospital or if any fever,nausea/vomiting,abdominal pain, uncontrolled pain, chest pain,  shortness of breath or any other alarming symptoms.  Please follow-up your doctor as instructed in a week time and call the office for appointment.  Please avoid alcohol, smoking, or any other illicit substance and maintain healthy habits including taking your regular medications as prescribed.  You were cared for by a hospitalist during your hospital stay. If you have any questions about your discharge medications or the care you received while you were in the hospital after you are discharged, you can call the unit and ask to speak with the hospitalist on call if the hospitalist that took care of you is not available.  Once you are discharged, your primary care physician will handle any further medical issues. Please note that NO REFILLS for any discharge medications will be authorized once you are discharged, as it is imperative that you return to your primary care physician (or establish a relationship with a primary care physician if you do not have one) for your aftercare needs so that they can reassess your need for medications and monitor your lab values   Increase activity slowly   Complete by: As directed       Allergies as of 12/29/2020   No Known Allergies  Medication List     STOP taking these medications    ciprofloxacin 500 MG tablet Commonly known as: CIPRO   olmesartan 20 MG tablet Commonly known as: BENICAR       TAKE these medications    acidophilus Caps capsule Take 1 capsule by mouth daily.   amLODipine 5 MG tablet Commonly known as: NORVASC Take 1 tablet (5 mg total) by mouth daily.   feeding supplement Liqd Take 237 mLs by mouth 2 (two) times daily between meals.    finasteride 5 MG tablet Commonly known as: PROSCAR Take 5 mg by mouth at bedtime.   Fusion Plus Caps Take 1 capsule by mouth daily.   multivitamin with minerals Tabs tablet Take 1 tablet by mouth daily.   OLANZapine zydis 5 MG disintegrating tablet Commonly known as: ZYPREXA Take 0.5 tablets (2.5 mg total) by mouth at bedtime as needed (agitation).   rivaroxaban 20 MG Tabs tablet Commonly known as: XARELTO Take 20 mg by mouth daily with supper.   tamsulosin 0.4 MG Caps capsule Commonly known as: FLOMAX Take 0.4 mg by mouth daily.   thiamine 100 MG tablet Take 1 tablet (100 mg total) by mouth daily.        Contact information for follow-up providers     [name], MD Follow up in 1 week(s).   Specialty: Family Medicine Contact information: [address] [place] [address] [phone]         Jannifer Hick, MD Follow up in 2 week(s).   Specialty: Urology Contact information: [address] [place] [address] [phone]              Contact information for after-discharge care     Destination     [facility] Preferred SNF .   Service: Skilled Nursing Contact information: [address] [place] [address] [phone]                    No Known Allergies  The results of significant diagnostics from this hospitalization (including imaging, microbiology, ancillary and laboratory) are listed below for reference.    Microbiology: Recent Results (from the past 240 hour(s))  Urine Culture     Status: None   Collection Time: 12/22/20 10:52 AM   Specimen: Urine, Catheterized  Result Value Ref Range Status   Specimen Description URINE, CATHETERIZED  Final   Special Requests NONE  Final   Culture   Final    NO GROWTH Performed at [facility], [address], [place], [place] [address]    Report Status 12/23/2020 FINAL  Final  SARS CORONAVIRUS 2 (TAT 6-24  HRS) Nasopharyngeal Nasopharyngeal Swab     Status: None   Collection Time: 12/27/20  4:59 PM   Specimen: Nasopharyngeal Swab  Result Value Ref Range Status   SARS Coronavirus 2 NEGATIVE NEGATIVE Final    Comment: (NOTE) SARS-CoV-2 target nucleic acids are NOT DETECTED.  The SARS-CoV-2 RNA is generally detectable in upper and lower respiratory specimens during the acute phase of infection. Negative results do not preclude SARS-CoV-2 infection, do not rule out co-infections with other pathogens, and should not be used as the sole basis for treatment or other patient management decisions. Negative results must be combined with clinical observations, patient history, and epidemiological information. The expected result is Negative.  Fact Sheet for Patients: HairSlick.no  Fact Sheet for Healthcare Providers: quierodirigir.com  This test is not yet approved or cleared  by the [place] and  has been authorized for detection and/or diagnosis of SARS-CoV-2 by FDA under an Emergency Use Authorization (EUA). This EUA will remain  in effect (meaning this test can be used) for the duration of the COVID-19 declaration under Se ction 564(b)(1) of the Act, 21 U.S.C. section 360bbb-3(b)(1), unless the authorization is terminated or revoked sooner.  Performed at [facility], [address] [place], [place] [address]     Procedures/Studies:   Labs: BNP (last 3 results) Recent Labs    02/24/20 1223  BNP 285.5*   Basic Metabolic Panel: Recent Labs  Lab 12/23/20 0357 12/26/20 0107 12/27/20 0514 12/28/20 1039  NA 143 146* 145 146*  K 3.8 3.4* 3.5 3.7  CL 104 111 110 112*  CO2 21* 23 22 22   GLUCOSE 88 101* 95 77  BUN 35* 42* 33* 28*  CREATININE 1.48* 1.50* 1.26* 1.42*  CALCIUM 9.3 8.5* 8.6* 8.7*   Liver Function Tests: No results for input(s): AST, ALT, ALKPHOS, BILITOT, PROT, ALBUMIN in the last 168  hours.  No results for input(s): LIPASE, AMYLASE in the last 168 hours. Recent Labs  Lab 12/26/20 0107  AMMONIA 15   CBC: Recent Labs  Lab 12/27/20 0514 12/28/20 1039  WBC 5.4 6.4  HGB 9.0* 9.5*  HCT 30.2* 31.4*  MCV 85.1 85.3  PLT 277 294   Cardiac Enzymes: No results for input(s): CKTOTAL, CKMB, CKMBINDEX, TROPONINI in the last 168 hours.  BNP: Invalid input(s): POCBNP CBG: No results for input(s): GLUCAP in the last 168 hours.  D-Dimer No results for input(s): DDIMER in the last 72 hours. Hgb A1c No results for input(s): HGBA1C in the last 72 hours. Lipid Profile No results for input(s): CHOL, HDL, LDLCALC, TRIG, CHOLHDL, LDLDIRECT in the last 72 hours. Thyroid function studies No results for input(s): TSH, T4TOTAL, T3FREE, THYROIDAB in the last 72 hours.  Invalid input(s): FREET3 Anemia work up No results for input(s): VITAMINB12, FOLATE, FERRITIN, TIBC, IRON, RETICCTPCT in the last 72 hours. Urinalysis    Component Value Date/Time   COLORURINE AMBER (A) 12/22/2020 1052   APPEARANCEUR CLOUDY (A) 12/22/2020 1052   LABSPEC 1.011 12/22/2020 1052   PHURINE 6.0 12/22/2020 1052   GLUCOSEU NEGATIVE 12/22/2020 1052   HGBUR LARGE (A) 12/22/2020 1052   BILIRUBINUR NEGATIVE 12/22/2020 1052   KETONESUR 5 (A) 12/22/2020 1052   PROTEINUR 100 (A) 12/22/2020 1052   NITRITE NEGATIVE 12/22/2020 1052   LEUKOCYTESUR LARGE (A) 12/22/2020 1052   Sepsis Labs Invalid input(s): PROCALCITONIN,  WBC,  LACTICIDVEN Microbiology Recent Results (from the past 240 hour(s))  Urine Culture     Status: None   Collection Time: 12/22/20 10:52 AM   Specimen: Urine, Catheterized  Result Value Ref Range Status   Specimen Description URINE, CATHETERIZED  Final   Special Requests NONE  Final   Culture   Final    NO GROWTH Performed at [facility], [address] [place], [place]    Report Status 12/23/2020 FINAL  Final  SARS CORONAVIRUS 2 (TAT 6-24 HRS) Nasopharyngeal  Nasopharyngeal Swab     Status: None   Collection Time: 12/27/20  4:59 PM   Specimen: Nasopharyngeal Swab  Result Value Ref Range Status   SARS Coronavirus 2 NEGATIVE NEGATIVE Final    Comment: (NOTE) SARS-CoV-2 target nucleic acids are NOT DETECTED.  The SARS-CoV-2 RNA is generally detectable in upper and lower respiratory specimens during the acute phase of infection. Negative results do  not preclude SARS-CoV-2 infection, do not rule out co-infections with other pathogens, and should not be used as the sole basis for treatment or other patient management decisions. Negative results must be combined with clinical observations, patient history, and epidemiological information. The expected result is Negative.  Fact Sheet for Patients: HairSlick.no  Fact Sheet for Healthcare Providers: quierodirigir.com  This test is not yet approved or cleared by the [place] FDA and  has been authorized for detection and/or diagnosis of SARS-CoV-2 by FDA under an Emergency Use Authorization (EUA). This EUA will remain  in effect (meaning this test can be used) for the duration of the COVID-19 declaration under Se ction 564(b)(1) of the Act, 21 U.S.C. section 360bbb-3(b)(1), unless the authorization is terminated or revoked sooner.  Performed at [facility], [address] [place], [place] [address]      Time coordinating discharge: 35 minutes  SIGNED: Lanae Boast, MD  [facility] 12/29/2020, 9:51 AM  If 7PM-7AM, please contact night-coverage [email or web address]

## 2021-01-02 ENCOUNTER — Emergency Department (HOSPITAL_COMMUNITY): Payer: Medicare HMO

## 2021-01-02 ENCOUNTER — Inpatient Hospital Stay (HOSPITAL_COMMUNITY)
Admission: EM | Admit: 2021-01-02 | Discharge: 2021-01-09 | DRG: 693 | Disposition: A | Discharge status: Skilled Nursing Facility | Payer: Medicare HMO | Source: Skilled Nursing Facility | Attending: Family Medicine | Admitting: Family Medicine

## 2021-01-02 ENCOUNTER — Encounter (HOSPITAL_COMMUNITY): Payer: Self-pay

## 2021-01-02 DIAGNOSIS — Z66 Do not resuscitate: Secondary | ICD-10-CM | POA: Diagnosis present

## 2021-01-02 DIAGNOSIS — R109 Unspecified abdominal pain: Secondary | ICD-10-CM | POA: Diagnosis present

## 2021-01-02 DIAGNOSIS — E871 Hypo-osmolality and hyponatremia: Secondary | ICD-10-CM | POA: Diagnosis present

## 2021-01-02 DIAGNOSIS — R338 Other retention of urine: Secondary | ICD-10-CM | POA: Diagnosis present

## 2021-01-02 DIAGNOSIS — N135 Crossing vessel and stricture of ureter without hydronephrosis: Secondary | ICD-10-CM | POA: Diagnosis not present

## 2021-01-02 DIAGNOSIS — N401 Enlarged prostate with lower urinary tract symptoms: Secondary | ICD-10-CM | POA: Diagnosis present

## 2021-01-02 DIAGNOSIS — N1330 Unspecified hydronephrosis: Secondary | ICD-10-CM | POA: Diagnosis present

## 2021-01-02 DIAGNOSIS — G9341 Metabolic encephalopathy: Secondary | ICD-10-CM | POA: Diagnosis present

## 2021-01-02 DIAGNOSIS — Z79899 Other long term (current) drug therapy: Secondary | ICD-10-CM | POA: Diagnosis not present

## 2021-01-02 DIAGNOSIS — I10 Essential (primary) hypertension: Secondary | ICD-10-CM | POA: Diagnosis not present

## 2021-01-02 DIAGNOSIS — Z20822 Contact with and (suspected) exposure to covid-19: Secondary | ICD-10-CM | POA: Diagnosis present

## 2021-01-02 DIAGNOSIS — Z86711 Personal history of pulmonary embolism: Secondary | ICD-10-CM | POA: Diagnosis not present

## 2021-01-02 DIAGNOSIS — N138 Other obstructive and reflux uropathy: Secondary | ICD-10-CM | POA: Diagnosis present

## 2021-01-02 DIAGNOSIS — E43 Unspecified severe protein-calorie malnutrition: Secondary | ICD-10-CM | POA: Diagnosis present

## 2021-01-02 DIAGNOSIS — R0689 Other abnormalities of breathing: Secondary | ICD-10-CM

## 2021-01-02 DIAGNOSIS — N210 Calculus in bladder: Secondary | ICD-10-CM | POA: Diagnosis present

## 2021-01-02 DIAGNOSIS — E870 Hyperosmolality and hypernatremia: Secondary | ICD-10-CM

## 2021-01-02 DIAGNOSIS — L89159 Pressure ulcer of sacral region, unspecified stage: Secondary | ICD-10-CM | POA: Diagnosis not present

## 2021-01-02 DIAGNOSIS — N390 Urinary tract infection, site not specified: Secondary | ICD-10-CM | POA: Insufficient documentation

## 2021-01-02 DIAGNOSIS — N1831 Chronic kidney disease, stage 3a: Secondary | ICD-10-CM | POA: Diagnosis present

## 2021-01-02 DIAGNOSIS — N131 Hydronephrosis with ureteral stricture, not elsewhere classified: Secondary | ICD-10-CM | POA: Diagnosis present

## 2021-01-02 DIAGNOSIS — E860 Dehydration: Secondary | ICD-10-CM | POA: Diagnosis present

## 2021-01-02 DIAGNOSIS — R0989 Other specified symptoms and signs involving the circulatory and respiratory systems: Secondary | ICD-10-CM

## 2021-01-02 DIAGNOSIS — D631 Anemia in chronic kidney disease: Secondary | ICD-10-CM | POA: Diagnosis present

## 2021-01-02 DIAGNOSIS — I129 Hypertensive chronic kidney disease with stage 1 through stage 4 chronic kidney disease, or unspecified chronic kidney disease: Secondary | ICD-10-CM | POA: Diagnosis present

## 2021-01-02 DIAGNOSIS — E876 Hypokalemia: Secondary | ICD-10-CM | POA: Diagnosis present

## 2021-01-02 DIAGNOSIS — N183 Chronic kidney disease, stage 3 unspecified: Secondary | ICD-10-CM | POA: Diagnosis not present

## 2021-01-02 DIAGNOSIS — Z7901 Long term (current) use of anticoagulants: Secondary | ICD-10-CM

## 2021-01-02 DIAGNOSIS — Z87891 Personal history of nicotine dependence: Secondary | ICD-10-CM

## 2021-01-02 DIAGNOSIS — I700 Atherosclerosis of aorta: Secondary | ICD-10-CM | POA: Diagnosis present

## 2021-01-02 LAB — URINALYSIS, ROUTINE W REFLEX MICROSCOPIC
Glucose, UA: NEGATIVE mg/dL
Ketones, ur: NEGATIVE mg/dL
Nitrite: NEGATIVE
RBC / HPF: >50 RBC/hpf — ABNORMAL HIGH (ref 0–5)
Specific Gravity, Urine: 1.01 (ref 1.005–1.030)
WBC, UA: >50 WBC/hpf — ABNORMAL HIGH (ref 0–5)
pH: 6 (ref 5.0–8.0)

## 2021-01-02 LAB — CBC WITH DIFFERENTIAL/PLATELET
Abs Immature Granulocytes: 0.03 10*3/uL (ref 0.00–0.07)
Basophils Absolute: 0 10*3/uL (ref 0.0–0.1)
Basophils Relative: 1 %
Eosinophils Absolute: 0.3 10*3/uL (ref 0.0–0.5)
Eosinophils Relative: 3 %
HCT: 35.7 % — ABNORMAL LOW (ref 39.0–52.0)
Hemoglobin: 10.4 g/dL — ABNORMAL LOW (ref 13.0–17.0)
Immature Granulocytes: 0 %
Lymphocytes Relative: 20 %
Lymphs Abs: 1.6 10*3/uL (ref 0.7–4.0)
MCH: 25.5 pg — ABNORMAL LOW (ref 26.0–34.0)
MCHC: 29.1 g/dL — ABNORMAL LOW (ref 30.0–36.0)
MCV: 87.5 fL (ref 80.0–100.0)
Monocytes Absolute: 0.7 10*3/uL (ref 0.1–1.0)
Monocytes Relative: 9 %
Neutro Abs: 5.3 10*3/uL (ref 1.7–7.7)
Neutrophils Relative %: 67 %
Platelets: 298 10*3/uL (ref 150–400)
RBC: 4.08 MIL/uL — ABNORMAL LOW (ref 4.22–5.81)
RDW: 17.9 % — ABNORMAL HIGH (ref 11.5–15.5)
WBC: 8 10*3/uL (ref 4.0–10.5)
nRBC: 0 % (ref 0.0–0.2)

## 2021-01-02 LAB — COMPREHENSIVE METABOLIC PANEL
ALT: 11 U/L (ref 0–44)
AST: 15 U/L (ref 15–41)
Albumin: 2.2 g/dL — ABNORMAL LOW (ref 3.5–5.0)
Alkaline Phosphatase: 48 U/L (ref 38–126)
Anion gap: 9 (ref 5–15)
BUN: 28 mg/dL — ABNORMAL HIGH (ref 8–23)
CO2: 24 mmol/L (ref 22–32)
Calcium: 9 mg/dL (ref 8.9–10.3)
Chloride: 118 mmol/L — ABNORMAL HIGH (ref 98–111)
Creatinine, Ser: 1.22 mg/dL (ref 0.61–1.24)
GFR, Estimated: 57 mL/min — ABNORMAL LOW (ref >60)
Glucose, Bld: 164 mg/dL — ABNORMAL HIGH (ref 70–99)
Potassium: 3.8 mmol/L (ref 3.5–5.1)
Sodium: 151 mmol/L — ABNORMAL HIGH (ref 135–145)
Total Bilirubin: 0.5 mg/dL (ref 0.3–1.2)
Total Protein: 6.2 g/dL — ABNORMAL LOW (ref 6.5–8.1)

## 2021-01-02 MED ORDER — ACETAMINOPHEN 325 MG PO TABS
650.0000 mg | ORAL_TABLET | Freq: Four times a day (QID) | ORAL | Status: DC | PRN
  Administered 2021-01-04 – 2021-01-08 (×3): 650 mg via ORAL
  Filled 2021-01-02 (×3): qty 2

## 2021-01-02 MED ORDER — ONDANSETRON HCL 4 MG PO TABS: 4.0000 mg | ORAL_TABLET | Freq: Four times a day (QID) | ORAL | Status: DC | PRN

## 2021-01-02 MED ORDER — SODIUM CHLORIDE 0.9 % IV SOLN
2.0000 g | Freq: Once | INTRAVENOUS | Status: AC
  Administered 2021-01-02: 2 g via INTRAVENOUS
  Filled 2021-01-02: qty 2

## 2021-01-02 MED ORDER — DEXTROSE-NACL 5-0.45 % IV SOLN: INTRAVENOUS | Status: DC

## 2021-01-02 MED ORDER — AMLODIPINE BESYLATE 5 MG PO TABS
5.0000 mg | ORAL_TABLET | Freq: Every day | ORAL | Status: DC
  Administered 2021-01-03 – 2021-01-09 (×6): 5 mg via ORAL
  Filled 2021-01-02 (×6): qty 1

## 2021-01-02 MED ORDER — ACETAMINOPHEN 650 MG RE SUPP: 650.0000 mg | Freq: Four times a day (QID) | RECTAL | Status: DC | PRN

## 2021-01-02 MED ORDER — SODIUM CHLORIDE 0.9 % IV SOLN
2.0000 g | Freq: Two times a day (BID) | INTRAVENOUS | Status: DC
  Administered 2021-01-03 – 2021-01-04 (×3): 2 g via INTRAVENOUS
  Filled 2021-01-02 (×5): qty 2

## 2021-01-02 MED ORDER — TAMSULOSIN HCL 0.4 MG PO CAPS
0.4000 mg | ORAL_CAPSULE | Freq: Every day | ORAL | Status: DC
  Administered 2021-01-03 – 2021-01-09 (×6): 0.4 mg via ORAL
  Filled 2021-01-02 (×6): qty 1

## 2021-01-02 MED ORDER — ONDANSETRON HCL 4 MG/2ML IJ SOLN
4.0000 mg | Freq: Four times a day (QID) | INTRAMUSCULAR | Status: DC | PRN
  Administered 2021-01-03: 4 mg via INTRAVENOUS

## 2021-01-02 MED ORDER — SODIUM CHLORIDE 0.45 % IV SOLN: INTRAVENOUS | Status: DC

## 2021-01-02 NOTE — Progress Notes (Signed)
A consult was received from an ED physician for cefepime per pharmacy dosing.  The patient's profile has been reviewed for ht/wt/allergies/indication/available labs.   A one time order has been placed for cefepime 2 g IV.  Further antibiotics/pharmacy consults should be ordered by admitting physician if indicated.                       Thank you, Selinda Eon, PharmD, BCPS Clinical Pharmacist Reader Please utilize Amion for appropriate phone number to reach the unit pharmacist [facility]) 01/02/2021 9:24 PM

## 2021-01-02 NOTE — H&P (Signed)
History and Physical    Corey Cummings LNL:[id number] DOB: [birth year] DOA: 01/02/2021  PCP: [name], MD   Patient coming from: SNF  Chief Complaint: confusion, elevated sodium level at SNF  HPI: Corey Cummings is a 87 y.o. male with medical history significant for HTN, chronic indwelling Foley catheter, nephrolithiasis, ED/PE on Xarelto, and BPH presents with complaints of being noted to be acutely altered.  History is mostly obtained from the patient's daughter who is present at bedside as he is acutely altered.  At baseline patient had normally been alert and oriented x3.   Patient had just been hospitalized from 8/28 -8/31 for severe sepsis secondary to catheter associated urinary tract infection with hydronephrosis secondary to right UVJ calculus.  Urology has been c consulted and placed stents.  Urine cultures grew out Citrobacter freundii(resistant to cefazolin, Rocephin, and nitrofurantoin. He was admitted on 12/14/20 to 12/29/20 and treated with cefepime as well as Diflucan as there is yeast in his urine based on cultures. He was discharged to SNF.  He was sent to ER today with elevated sodium level and confusion.  Daughter reports that when she went to visit him he was staring at the ceiling and reaching for things that were not there and was combative which is not his normal self.  There was no report of any fever or chills.  There is no report of any vomiting or diarrhea.  He has been weak and is having physical therapy at the skilled nursing facility.  He is confused not able to provide any history himself.  Daughter states that he has not had any falls that she knows of for head trauma.  ED Course: Corey Cummings is been hemodynamically stable in the emergency room.  He has been confused and lethargic but will open his eyes to voice and is not able to provide any history.  CT scan reveals obstructing right stone that is still present.  Case was discussed with urology by the ER physician and  urology will see patient in the morning.  Patient is placed on cefepime for antibiotic coverage.  Lab work reveals an unremarkable CBC.  Sodium 151 potassium 3.8 chloride 118 bicarb 24 creatinine 1.22 BUN 28 glucose 164 albumin 2.2 alkaline phosphatase 48 AST 15 ALT 11 L 0.5.  Urine is cloudy with large leukocyte negative nitrite and large amount of WBCs.  Urine cultures obtained in the emergency room will be monitored.  Hospitalist service been asked to admit for further management  Review of Systems:  Unable to obtain review of systems secondary to AMS  Past Medical History:  Diagnosis Date   BPH (benign prostatic hyperplasia)    Hypertension     Past Surgical History:  Procedure Laterality Date   APPENDECTOMY     CYSTOSCOPY W/ URETERAL STENT PLACEMENT Right 12/04/2020   Procedure: CYSTOSCOPY WITH RETROGRADE PYELOGRAM/URETERAL STENT PLACEMENT;  Surgeon: Jannifer Hick, MD;  Location: [facility];  Service: Urology;  Laterality: Right;    Social History  reports that he has quit smoking. His smoking use included cigarettes. He has never used smokeless tobacco. He reports that he does not currently use alcohol. He reports that he does not currently use drugs.  No Known Allergies  History reviewed. No pertinent family history.   Prior to Admission medications   Medication Sig Start Date End Date Taking? Authorizing Provider  acidophilus (RISAQUAD) CAPS capsule Take 1 capsule by mouth daily. 12/27/20   Lanae Boast, MD  amLODipine (NORVASC) 5 MG tablet  Take 1 tablet (5 mg total) by mouth daily. 12/07/20   Erick Blinks, MD  feeding supplement (ENSURE ENLIVE / ENSURE PLUS) LIQD Take 237 mLs by mouth 2 (two) times daily between meals. 12/29/20   Lanae Boast, MD  finasteride (PROSCAR) 5 MG tablet Take 5 mg by mouth at bedtime. 01/12/20   [provider]  Iron-FA-B Cmp-C-Biot-Probiotic (FUSION PLUS) CAPS Take 1 capsule by mouth daily. 11/28/20   [provider]  Multiple Vitamin  (MULTIVITAMIN WITH MINERALS) TABS tablet Take 1 tablet by mouth daily. 12/29/20   Lanae Boast, MD  OLANZapine zydis (ZYPREXA) 5 MG disintegrating tablet Take 0.5 tablets (2.5 mg total) by mouth at bedtime as needed (agitation). 12/29/20   Lanae Boast, MD  rivaroxaban (XARELTO) 20 MG TABS tablet Take 20 mg by mouth daily with supper.    [provider]  tamsulosin (FLOMAX) 0.4 MG CAPS capsule Take 0.4 mg by mouth daily. 10/20/20   [provider]  thiamine 100 MG tablet Take 1 tablet (100 mg total) by mouth daily. 12/29/20 01/28/21  Lanae Boast, MD    Physical Exam: Vitals:   01/02/21 1915 01/02/21 1945 01/02/21 2000 01/02/21 2100  BP: 116/75 103/72 104/74 111/75  Pulse: 88  90 93  Resp: (!) 23 17 17 18   Temp:      TempSrc:      SpO2: 99%  99% 98%  Weight:      Height:        Constitutional: NAD,  comfortable Vitals:   01/02/21 1915 01/02/21 1945 01/02/21 2000 01/02/21 2100  BP: 116/75 103/72 104/74 111/75  Pulse: 88  90 93  Resp: (!) 23 17 17 18   Temp:      TempSrc:      SpO2: 99%  99% 98%  Weight:      Height:       Eyes: PERRL, conjunctivae normal ENMT: Mucous membranes are dry. Posterior pharynx clear of any exudate or lesions.  Neck: normal, supple, no masses, no thyromegaly.  Achy midline Respiratory: clear to auscultation bilaterally, no wheezing, no crackles. Normal respiratory effort. No accessory muscle use.  Cardiovascular: Regular rate and rhythm, no murmurs / rubs / gallops. No extremity edema. 2+ pedal pulses.  Abdomen: no tenderness, no masses palpated. No hepatosplenomegaly. Bowel sounds positive.  Musculoskeletal: no cyanosis. No joint deformity upper and lower extremities. Good ROM, no contractures.  Decreased muscle tone.  Thin extremities. Skin: no rashes, lesions, ulcers. No induration Neurologic: No tremor. sensation intact, DTR normal. Psychiatric: Poor judgment.  Alert and oriented to person and able to state the year 2022, but not oriented  to place or situation.   Labs on Admission: I have personally reviewed following labs and imaging studies  CBC: Recent Labs  Lab 12/27/20 0514 12/28/20 1039 01/02/21 1811  WBC 5.4 6.4 8.0  NEUTROABS  --   --  5.3  HGB 9.0* 9.5* 10.4*  HCT 30.2* 31.4* 35.7*  MCV 85.1 85.3 87.5  PLT 277 294 298    Basic Metabolic Panel: Recent Labs  Lab 12/27/20 0514 12/28/20 1039 01/02/21 1811  NA 145 146* 151*  K 3.5 3.7 3.8  CL 110 112* 118*  CO2 22 22 24   GLUCOSE 95 77 164*  BUN 33* 28* 28*  CREATININE 1.26* 1.42* 1.22  CALCIUM 8.6* 8.7* 9.0    GFR: Estimated Creatinine Clearance: 33.7 mL/min (by C-G formula based on SCr of 1.22 mg/dL).  Liver Function Tests: Recent Labs  Lab 01/02/21 1811  AST  15  ALT 11  ALKPHOS 48  BILITOT 0.5  PROT 6.2*  ALBUMIN 2.2*    Urine analysis:    Component Value Date/Time   COLORURINE YELLOW 01/02/2021 1811   APPEARANCEUR CLOUDY (A) 01/02/2021 1811   LABSPEC 1.010 01/02/2021 1811   PHURINE 6.0 01/02/2021 1811   GLUCOSEU NEGATIVE 01/02/2021 1811   HGBUR LARGE (A) 01/02/2021 1811   BILIRUBINUR SMALL (A) 01/02/2021 1811   KETONESUR NEGATIVE 01/02/2021 1811   PROTEINUR TRACE (A) 01/02/2021 1811   NITRITE NEGATIVE 01/02/2021 1811   LEUKOCYTESUR LARGE (A) 01/02/2021 1811    Radiological Exams on Admission: CT Renal Stone Study  Result Date: 01/02/2021 CLINICAL DATA:  Urinary tract calculus EXAM: CT ABDOMEN AND PELVIS WITHOUT CONTRAST TECHNIQUE: Multidetector CT imaging of the abdomen and pelvis was performed following the standard protocol without IV contrast. COMPARISON:  12/14/2020 FINDINGS: Lower chest: Small right pleural effusion has developed in the interim. No acute airspace disease. Hepatobiliary: High attenuation material within the gallbladder likely reflects gallbladder sludge. No calcified gallstones or cholecystitis. Unenhanced imaging of the liver is unremarkable. Pancreas: Unremarkable. No pancreatic ductal dilatation or  surrounding inflammatory changes. Spleen: Normal in size without focal abnormality. Adrenals/Urinary Tract: A large obstructing calculus at the right UVJ is again identified, measuring up to 3.3 x 1.4 cm. The right ureteral stent seen on prior study is again identified, proximal aspect coiled within the right UPJ and the distal aspect coiled within the distal right ureter and right UVJ abutting the calculus. There is been recurrence of severe high-grade right-sided obstructive uropathy with marked hydronephrosis and hydroureter. Portion configuration of the kidneys again noted, with marked atrophy of the right-sided moiety. Chronic dilation of the left renal pelvis and proximal left ureter without clear cause for obstruction. The adrenals are unremarkable. There is gas in the bladder lumen. A Foley catheter is identified within the prostatic urethra. The balloon is inflated within the region of the membranous urethra. Stomach/Bowel: No bowel obstruction or ileus. Distal colonic diverticulosis without diverticulitis. No bowel Hitchcock thickening or inflammatory change. Vascular/Lymphatic: Aortic atherosclerosis. No enlarged abdominal or pelvic lymph nodes. Reproductive: The prostate is enlarged but stable. Other: No free fluid or free gas.  No abdominal Vilardi hernia. Musculoskeletal: No acute or destructive bony lesions. Chronic L1 compression deformity unchanged. Reconstructed images demonstrate no additional findings. IMPRESSION: 1. Recurrent severe right-sided hydronephrosis and hydroureter related to an obstructing 3.3 cm right UVJ calculus. No significant change in the position of the right ureteral stent, extending from the right UPJ to the region of the right UVJ. 2. Malpositioning of the Foley catheter, with the balloon inflated within the prostatic/membranous urethra. Recommend removal and replacement. 3. Stable nonobstructing right renal calculi. 4. Horseshoe configuration of the kidneys, with marked atrophy of  the right moiety. 5. Small right pleural effusion. 6. Gallbladder sludge. No evidence of cholelithiasis or cholecystitis. 7.  Aortic Atherosclerosis (ICD10-I70.0). Critical Value/emergent results were called by telephone at the time of interpretation on 01/02/2021 at 8:43 pm to provider [facility] , who verbally acknowledged these results. Electronically Signed   By: Sharlet Salina M.D.   On: 01/02/2021 20:46     Assessment/Plan Active Problems:   Ureteral obstruction, right Corey Cummings with 3.3 cm right UVJ stone causing obstruction. Admitted to Med-Surg floor.  Urology consulted and Dr Arita Miss to evaluate in am.  IVF hydration with 1/2 NS overnight.  Has foley catheter.     CKD (chronic kidney disease), stage III  Stable.  Monitor renal function  and electrolytes with labs in am    Acute metabolic encephalopathy Due to hypernatremia and UTI. Fall precautions.  Monitor closely.     Hydronephrosis of right kidney Due to obstructing stone. Urology consulted.     Complicated UTI (urinary tract infection) Foley to be replaced. Started on Cefepime. Reviewed previous cultures.     Hypernatremia 1/2 NS IVF hydration overnight.  Recheck electrolytes in am    Essential hypertension Monitor BP. Continue Norvasc.     DVT prophylaxis: Patient's been on Xarelto.  Xarelto held with potential urological procedure in the morning.  SCDs overnight Code Status:   DNR Family Communication:  Diagnosis and plan discussed with patient's daughter is at bedside.  Questions answered.  Further recommendations to follow as clinical indicated Disposition Plan:   Patient is from:  SNF  Anticipated DC to:  SNF  Anticipated DC date:  Anticipate 2 midnight or more stay in the hospital  Consults called:  Urology, Dr. Arita Miss consulted by ER and will see in am  Admission status:  Inpatient   Corey Cummings Miya Luviano MD [facility]  How to contact the [facility] Attending or Consulting provider 7A - 7P or covering  provider during after hours 7P -7A, for this patient?   Check the care team in [facility] and look for a) attending/consulting [facility] provider listed and b) the [facility] team listed Log into [email or web address] and use [place]'s universal password to access. If you do not have the password, please contact the hospital operator. Locate the [facility] provider you are looking for under [facility] and page to a number that you can be directly reached. If you still have difficulty reaching the provider, please page the [facility] (Director on Call) for the Hospitalists listed on amion for assistance.  01/02/2021, 10:23 PM

## 2021-01-02 NOTE — Progress Notes (Signed)
Urology aware of admission.  Plan to see patient in AM.    -Please make NPO mn for possible stent exchange tomorrow -Foley can be changed by RN in AM

## 2021-01-02 NOTE — ED Notes (Signed)
Patient transported to CT 

## 2021-01-02 NOTE — ED Provider Notes (Signed)
[facility]-EMERGENCY DEPT Provider Note   CSN: [id number] Arrival date & time: 01/02/21  1744     History Chief Complaint  Patient presents with   Abdominal Pain    Jamien Casanova is a 87 y.o. male.  87 year old male with prior medical history as detailed below presents for evaluation.  Patient sent from [facility] for evaluation.  Patient with recent sodium check of 157.  Patient appears to be without specific complaint.  The patient is pleasant and cooperative.  Hypernatremia, per report, is new.  Labs appear to have been drawn while at [facility] on September 23.  Results reported on September 23.  Patient was sent to the ED today (9/26).    The history is provided by the patient and medical records.  Illness Location:  Hypernatremia - 157 Severity:  Unable to specify Onset quality:  Unable to specify Timing:  Unable to specify Progression:  Unable to specify     Past Medical History:  Diagnosis Date   BPH (benign prostatic hyperplasia)    Hypertension     Patient Active Problem List   Diagnosis Date Noted   Protein-calorie malnutrition, severe 12/29/2020   Pressure injury of skin 12/24/2020   Acute metabolic encephalopathy 12/14/2020   Hypokalemia 12/14/2020   History of DVT (deep vein thrombosis) 12/14/2020   History of pulmonary embolus (PE) 12/14/2020   Severe sepsis (HCC) 12/04/2020   Lactic acidosis 12/04/2020   Acute cystitis without hematuria 12/04/2020   Foley catheter problem (HCC) 12/04/2020   Unilateral recurrent inguinal hernia without obstruction or gangrene 12/04/2020   Acute pyelonephritis 12/04/2020   BPH (benign prostatic hyperplasia) 12/04/2020   Chronic indwelling Foley catheter 12/04/2020   Hypertensive urgency 12/04/2020   CKD (chronic kidney disease), stage III (HCC) 12/04/2020   Acute pulmonary embolism (HCC) 02/24/2020   Ureteral obstruction, right 02/24/2020   Urinary tract infection associated with  catheterization of urinary tract, initial encounter (HCC) 02/24/2020   Renal insufficiency 02/24/2020   Thoracic aortic aneurysm without rupture (HCC) 02/24/2020   Bronchiectasis (HCC) 02/24/2020   Pulmonary embolism (HCC) 02/24/2020   Hydronephrosis with urinary obstruction due to ureteral calculus     Past Surgical History:  Procedure Laterality Date   APPENDECTOMY     CYSTOSCOPY W/ URETERAL STENT PLACEMENT Right 12/04/2020   Procedure: CYSTOSCOPY WITH RETROGRADE PYELOGRAM/URETERAL STENT PLACEMENT;  Surgeon: Jannifer Hick, MD;  Location: [facility];  Service: Urology;  Laterality: Right;       No family history on file.  Social History   Tobacco Use   Smoking status: Former    Types: Cigarettes   Smokeless tobacco: Never   Tobacco comments:    Quit 40 years ago  Substance Use Topics   Alcohol use: Not Currently   Drug use: Not Currently    Home Medications Prior to Admission medications   Medication Sig Start Date End Date Taking? Authorizing Provider  acidophilus (RISAQUAD) CAPS capsule Take 1 capsule by mouth daily. 12/27/20   Lanae Boast, MD  amLODipine (NORVASC) 5 MG tablet Take 1 tablet (5 mg total) by mouth daily. 12/07/20   Erick Blinks, MD  feeding supplement (ENSURE ENLIVE / ENSURE PLUS) LIQD Take 237 mLs by mouth 2 (two) times daily between meals. 12/29/20   Lanae Boast, MD  finasteride (PROSCAR) 5 MG tablet Take 5 mg by mouth at bedtime. 01/12/20   [provider]  Iron-FA-B Cmp-C-Biot-Probiotic (FUSION PLUS) CAPS Take 1 capsule by mouth daily. 11/28/20   [provider]  Multiple Vitamin (MULTIVITAMIN WITH MINERALS) TABS tablet Take 1 tablet by mouth daily. 12/29/20   Lanae Boast, MD  OLANZapine zydis (ZYPREXA) 5 MG disintegrating tablet Take 0.5 tablets (2.5 mg total) by mouth at bedtime as needed (agitation). 12/29/20   Lanae Boast, MD  rivaroxaban (XARELTO) 20 MG TABS tablet Take 20 mg by mouth daily with supper.    [provider]   tamsulosin (FLOMAX) 0.4 MG CAPS capsule Take 0.4 mg by mouth daily. 10/20/20   [provider]  thiamine 100 MG tablet Take 1 tablet (100 mg total) by mouth daily. 12/29/20 01/28/21  Lanae Boast, MD    Allergies    Patient has no known allergies.  Review of Systems   Review of Systems  All other systems reviewed and are negative.  Physical Exam Updated Vital Signs BP 128/75 (BP Location: Left Arm)   Pulse 95   Temp 98.8 F (37.1 C) (Axillary)   Resp 18   Ht 5\' 9"  (1.753 m)   Wt 57 kg   SpO2 100%   BMI 18.56 kg/m   Physical Exam Vitals and nursing note reviewed.  Constitutional:      General: He is not in acute distress.    Appearance: Normal appearance. He is well-developed.  HENT:     Head: Normocephalic and atraumatic.  Eyes:     Conjunctiva/sclera: Conjunctivae normal.     Pupils: Pupils are equal, round, and reactive to light.  Cardiovascular:     Rate and Rhythm: Normal rate and regular rhythm.     Heart sounds: Normal heart sounds.  Pulmonary:     Effort: Pulmonary effort is normal. No respiratory distress.     Breath sounds: Normal breath sounds.  Abdominal:     General: There is no distension.     Palpations: Abdomen is soft.     Tenderness: There is no abdominal tenderness.  Genitourinary:    Comments: Chronic indwelling foley in place Musculoskeletal:        General: No deformity. Normal range of motion.     Cervical back: Normal range of motion and neck supple.  Skin:    General: Skin is warm and dry.  Neurological:     General: No focal deficit present.     Mental Status: He is alert and oriented to person, place, and time.    ED Results / Procedures / Treatments   Labs (all labs ordered are listed, but only abnormal results are displayed) Labs Reviewed  URINALYSIS, ROUTINE W REFLEX MICROSCOPIC  COMPREHENSIVE METABOLIC PANEL  CBC WITH DIFFERENTIAL/PLATELET    EKG None  Radiology No results found.  Procedures Procedures    Medications Ordered in ED Medications - No data to display  ED Course  I have reviewed the triage vital signs and the nursing notes.  Pertinent labs & imaging results that were available during my care of the patient were reviewed by me and considered in my medical decision making (see chart for details).    MDM Rules/Calculators/A&P                           MDM  MSE complete  Brooks Kinnan was evaluated in Emergency Department on 01/02/2021 for the symptoms described in the history of present illness. He was evaluated in the context of the global COVID-19 pandemic, which necessitated consideration that the patient might be at risk for infection with the SARS-CoV-2 virus that causes COVID-19. Institutional  protocols and algorithms that pertain to the evaluation of patients at risk for COVID-19 are in a state of rapid change based on information released by regulatory bodies including the CDC and federal and state organizations. These policies and algorithms were followed during the patient's care in the ED.  Presented with reported elevated Sodium was elevated to 157 on September 23.  Patient with poor p.o. intake per family.  Repeat sodium is 151 today.  CT obtained to fully evaluate patient's reported right ureteral stent.  CT imaging result discussed with Dr. Arita Miss of Urology.  Dr. Arita Miss recommends admission, IV antibiotics, and urology will consult tomorrow.  Hospitalist service is aware of case.    Final Clinical Impression(s) / ED Diagnoses Final diagnoses:  Hypernatremia  Hydronephrosis, unspecified hydronephrosis type    Rx / DC Orders ED Discharge Orders     None        Wynetta Fines, MD 01/02/21 2336

## 2021-01-02 NOTE — ED Triage Notes (Signed)
High sodium reported, pt has no complaints arrived with foley in place

## 2021-01-03 ENCOUNTER — Inpatient Hospital Stay (HOSPITAL_COMMUNITY): Payer: Medicare HMO | Admitting: Certified Registered"

## 2021-01-03 ENCOUNTER — Inpatient Hospital Stay (HOSPITAL_COMMUNITY): Payer: Medicare HMO

## 2021-01-03 ENCOUNTER — Encounter (HOSPITAL_COMMUNITY): Payer: Self-pay | Admitting: Family Medicine

## 2021-01-03 ENCOUNTER — Encounter (HOSPITAL_COMMUNITY)
Admission: EM | Disposition: A | Discharge status: Skilled Nursing Facility | Payer: Self-pay | Source: Skilled Nursing Facility | Attending: Family Medicine

## 2021-01-03 DIAGNOSIS — N135 Crossing vessel and stricture of ureter without hydronephrosis: Secondary | ICD-10-CM

## 2021-01-03 HISTORY — PX: CYSTOSCOPY W/ URETERAL STENT PLACEMENT: SHX1429

## 2021-01-03 LAB — URINE CULTURE: Culture: NO GROWTH

## 2021-01-03 LAB — CBC
HCT: 33 % — ABNORMAL LOW (ref 39.0–52.0)
Hemoglobin: 9.7 g/dL — ABNORMAL LOW (ref 13.0–17.0)
MCH: 25.4 pg — ABNORMAL LOW (ref 26.0–34.0)
MCHC: 29.4 g/dL — ABNORMAL LOW (ref 30.0–36.0)
MCV: 86.4 fL (ref 80.0–100.0)
Platelets: 262 10*3/uL (ref 150–400)
RBC: 3.82 MIL/uL — ABNORMAL LOW (ref 4.22–5.81)
RDW: 18.1 % — ABNORMAL HIGH (ref 11.5–15.5)
WBC: 8.1 10*3/uL (ref 4.0–10.5)
nRBC: 0 % (ref 0.0–0.2)

## 2021-01-03 LAB — CBG MONITORING, ED: Glucose-Capillary: 106 mg/dL — ABNORMAL HIGH (ref 70–99)

## 2021-01-03 LAB — BASIC METABOLIC PANEL
Anion gap: 7 (ref 5–15)
BUN: 28 mg/dL — ABNORMAL HIGH (ref 8–23)
CO2: 23 mmol/L (ref 22–32)
Calcium: 7.7 mg/dL — ABNORMAL LOW (ref 8.9–10.3)
Chloride: 119 mmol/L — ABNORMAL HIGH (ref 98–111)
Creatinine, Ser: 0.91 mg/dL (ref 0.61–1.24)
GFR, Estimated: >60 mL/min (ref >60)
Glucose, Bld: 396 mg/dL — ABNORMAL HIGH (ref 70–99)
Potassium: 3.1 mmol/L — ABNORMAL LOW (ref 3.5–5.1)
Sodium: 149 mmol/L — ABNORMAL HIGH (ref 135–145)

## 2021-01-03 LAB — SARS CORONAVIRUS 2 BY RT PCR (HOSPITAL ORDER, PERFORMED IN ~~LOC~~ HOSPITAL LAB): SARS Coronavirus 2: NEGATIVE

## 2021-01-03 LAB — GLUCOSE, CAPILLARY: Glucose-Capillary: 97 mg/dL (ref 70–99)

## 2021-01-03 LAB — MAGNESIUM: Magnesium: 1.5 mg/dL — ABNORMAL LOW (ref 1.7–2.4)

## 2021-01-03 SURGERY — CYSTOSCOPY, WITH RETROGRADE PYELOGRAM AND URETERAL STENT INSERTION
Anesthesia: General | Site: Bladder | Laterality: Right

## 2021-01-03 MED ORDER — LIDOCAINE HCL (CARDIAC) PF 100 MG/5ML IV SOSY
PREFILLED_SYRINGE | INTRAVENOUS | Status: DC | PRN
  Administered 2021-01-03: 25 mg via INTRATRACHEAL

## 2021-01-03 MED ORDER — IOHEXOL 300 MG/ML  SOLN
INTRAMUSCULAR | Status: DC | PRN
  Administered 2021-01-03: 10 mL via URETHRAL

## 2021-01-03 MED ORDER — FENTANYL CITRATE (PF) 100 MCG/2ML IJ SOLN
INTRAMUSCULAR | Status: AC
  Filled 2021-01-03: qty 2

## 2021-01-03 MED ORDER — OLANZAPINE 5 MG PO TBDP: 2.5000 mg | ORAL_TABLET | Freq: Every evening | ORAL | Status: DC | PRN

## 2021-01-03 MED ORDER — STERILE WATER FOR IRRIGATION IR SOLN
Status: DC | PRN
  Administered 2021-01-03: 500 mL

## 2021-01-03 MED ORDER — POTASSIUM CHLORIDE 10 MEQ/100ML IV SOLN
10.0000 meq | INTRAVENOUS | Status: AC
Start: 2021-01-03 — End: 2021-01-03
  Administered 2021-01-03 (×2): 10 meq via INTRAVENOUS
  Filled 2021-01-03: qty 100

## 2021-01-03 MED ORDER — OLANZAPINE 5 MG PO TBDP
5.0000 mg | ORAL_TABLET | Freq: Every day | ORAL | Status: DC
  Administered 2021-01-03: 5 mg via ORAL
  Filled 2021-01-03: qty 1

## 2021-01-03 MED ORDER — CHLORHEXIDINE GLUCONATE 0.12 % MT SOLN
15.0000 mL | Freq: Once | OROMUCOSAL | Status: DC
  Filled 2021-01-03: qty 15

## 2021-01-03 MED ORDER — ALBUMIN HUMAN 5 % IV SOLN: INTRAVENOUS | Status: DC | PRN

## 2021-01-03 MED ORDER — PROPOFOL 10 MG/ML IV BOLUS
INTRAVENOUS | Status: AC
  Filled 2021-01-03: qty 20

## 2021-01-03 MED ORDER — SODIUM CHLORIDE 0.9 % IV SOLN: INTRAVENOUS | Status: DC | PRN

## 2021-01-03 MED ORDER — 0.9 % SODIUM CHLORIDE (POUR BTL) OPTIME
TOPICAL | Status: DC | PRN
  Administered 2021-01-03: 1000 mL

## 2021-01-03 MED ORDER — POTASSIUM CHLORIDE 10 MEQ/100ML IV SOLN
10.0000 meq | INTRAVENOUS | Status: AC
  Administered 2021-01-03 (×3): 10 meq via INTRAVENOUS
  Filled 2021-01-03 (×3): qty 100

## 2021-01-03 MED ORDER — SODIUM CHLORIDE 0.9 % IR SOLN
Status: DC | PRN
  Administered 2021-01-03: 15000 mL

## 2021-01-03 MED ORDER — FINASTERIDE 5 MG PO TABS
5.0000 mg | ORAL_TABLET | Freq: Every day | ORAL | Status: DC
  Administered 2021-01-03 – 2021-01-09 (×6): 5 mg via ORAL
  Filled 2021-01-03 (×6): qty 1

## 2021-01-03 MED ORDER — MAGNESIUM SULFATE 2 GM/50ML IV SOLN
2.0000 g | Freq: Once | INTRAVENOUS | Status: AC
  Administered 2021-01-03: 2 g via INTRAVENOUS
  Filled 2021-01-03: qty 50

## 2021-01-03 MED ORDER — FENTANYL CITRATE (PF) 100 MCG/2ML IJ SOLN
INTRAMUSCULAR | Status: DC | PRN
  Administered 2021-01-03 (×3): 25 ug via INTRAVENOUS

## 2021-01-03 MED ORDER — DEXAMETHASONE SODIUM PHOSPHATE 10 MG/ML IJ SOLN
INTRAMUSCULAR | Status: DC | PRN
  Administered 2021-01-03: 6 mg via INTRAVENOUS

## 2021-01-03 MED ORDER — PROPOFOL 10 MG/ML IV BOLUS
INTRAVENOUS | Status: DC | PRN
  Administered 2021-01-03: 120 mg via INTRAVENOUS

## 2021-01-03 MED ORDER — CHLORHEXIDINE GLUCONATE CLOTH 2 % EX PADS
6.0000 | MEDICATED_PAD | Freq: Every day | CUTANEOUS | Status: DC
  Administered 2021-01-03 – 2021-01-09 (×7): 6 via TOPICAL

## 2021-01-03 MED ORDER — PHENYLEPHRINE HCL (PRESSORS) 10 MG/ML IV SOLN
INTRAVENOUS | Status: DC | PRN
  Administered 2021-01-03: 60 ug via INTRAVENOUS
  Administered 2021-01-03 (×2): 180 ug via INTRAVENOUS

## 2021-01-03 MED ORDER — LACTATED RINGERS IV SOLN: INTRAVENOUS | Status: DC

## 2021-01-03 SURGICAL SUPPLY — 26 items
BAG DRN RND TRDRP ANRFLXCHMBR (UROLOGICAL SUPPLIES) ×1
BAG URINE DRAIN 2000ML AR STRL (UROLOGICAL SUPPLIES) ×2 IMPLANT
BAG URO CATCHER STRL LF (MISCELLANEOUS) ×2 IMPLANT
CATH FOLEY 2WAY 5CC 20FR (CATHETERS) ×2 IMPLANT
CATH INTERMIT  6FR 70CM (CATHETERS) IMPLANT
CATH URET 5FR 28IN OPEN ENDED (CATHETERS) ×2 IMPLANT
CLOTH BEACON ORANGE TIMEOUT ST (SAFETY) ×2 IMPLANT
FIBER LASER FLEXIVA 1000 (UROLOGICAL SUPPLIES) ×2 IMPLANT
GLOVE SURG ENC MOIS LTX SZ6.5 (GLOVE) ×2 IMPLANT
GLOVE SURG UNDER POLY LF SZ7 (GLOVE) ×2 IMPLANT
GOWN STRL REUS W/ TWL LRG LVL3 (GOWN DISPOSABLE) IMPLANT
GOWN STRL REUS W/TWL LRG LVL3 (GOWN DISPOSABLE) ×4 IMPLANT
GUIDEWIRE COONS BENTSON MOVE (WIRE) ×2 IMPLANT
GUIDEWIRE STR DUAL SENSOR (WIRE) IMPLANT
GUIDEWIRE ZIPWRE .038 STRAIGHT (WIRE) ×2 IMPLANT
IV NS IRRIG 3000ML ARTHROMATIC (IV SOLUTION) ×10 IMPLANT
KIT TURNOVER KIT A (KITS) ×2 IMPLANT
MANIFOLD NEPTUNE II (INSTRUMENTS) ×2 IMPLANT
NS IRRIG 1000ML POUR BTL (IV SOLUTION) ×2 IMPLANT
PACK CYSTO (CUSTOM PROCEDURE TRAY) ×2 IMPLANT
SYR 10ML LL (SYRINGE) ×2 IMPLANT
SYR TOOMEY IRRIG 70ML (MISCELLANEOUS) ×2
SYRINGE TOOMEY IRRIG 70ML (MISCELLANEOUS) ×1 IMPLANT
TUBING CONNECTING 10 (TUBING) ×2 IMPLANT
TUBING UROLOGY SET (TUBING) IMPLANT
WATER STERILE IRR 500ML POUR (IV SOLUTION) ×2 IMPLANT

## 2021-01-03 NOTE — ED Notes (Signed)
Message sent to inpatient nurse.  

## 2021-01-03 NOTE — Progress Notes (Signed)
PROGRESS NOTE    Corey Cummings  MPN:[id number] DOB: 04-11-1930 DOA: 01/02/2021 PCP: [name], MD   Chief Complain: Confusion, elevated sodium level  Brief Narrative: Patient is a 87 year old male with history of hypertension, chronic indwelling Foley catheter, nephrolithiasis, PE on Xarelto, BPH who was sent from his skilled nursing facility after he was found to be confused.  At baseline, he is usually alert and oriented.  He was recently hospitalized from 8/28-8/31 when he was treated for severe sepsis secondary to catheter associated UTI with hydronephrosis secondary to right UVJ calculus.  At that time urology placed ureteral stent.  Urine culture had grown Citrobacter freundii.  He was admitted again on 9/7-9/22 and was treated with cefepime as well as Diflucan and was discharged to skilled nursing  facility.  Blood work done  at nursing facility showed elevated sodium level.  On presentation he was hemodynamically stable but confused, weak.  CT imaging showed persistent obstructing right-sided stone.  Urine was cloudy, had large leukocytes.urology consulted and the plan is for right stent removal, cystoscopy lithotripsy today .  Patient is currently on cefepime.  Mental status has improved, he is currently alert and oriented.  Assessment & Plan:   Active Problems:   Ureteral obstruction, right   CKD (chronic kidney disease), stage III (HCC)   Acute metabolic encephalopathy   Hydronephrosis of right kidney   Complicated UTI (urinary tract infection)   Hypernatremia   Essential hypertension  Persistent right-sided urolithiasis/ureteral obstruction/UTI: Presented with confusion.  Urinalysis was positive of urinary tract infection showing lots of leukocytes.  He has been treated for UTI recently during his last hospitalization. CT imaging showed recurrent severe right-sided hydronephrosis and hydroureter related to an obstructing 3.3 cm right UVJ calculus.  Urology consulted and the  plan right stent removal, cystoscopy lithotripsy today.  Continue cefepime.  Follow-up urine culture. Foley catheter was changed in the emergency department  Hypernatremia: Currently on hypotonic fluid.  Hypernatremia was most likely secondary to dehydration ,we will continue to monitor BMP, improving  Hypokalemia/hypomagnesemia: Supplemented with potassium and magnesium.  Continue to monitor the levels.  CKD stage IIIa: Currently kidney function stable at baseline  Acute metabolic encephalopathy: Most likely secondary to combination of hyponatremia, UTI.  During my evaluation this morning, he was alert and oriented.  Delirium precautions  Hypertension: Currently blood pressure stable.  On Norvasc.  History of PE: On Xarelto  Normocytic anemia: Currently hemoglobin stable.  Continue to monitor  Debility/deconditioning: Currently staying at a skilled nursing facility.  Dispo discharge back to skilled nursing facility when medically appropriate.  We will order PT/OT when appropriate if needed  Severe protein calorie malnutrition: Nutrition consulted.             DVT prophylaxis: SCD Code Status: DNR Family Communication: Called both sister and daughter on phone for update, calls not received Status is: Inpatient  Remains inpatient appropriate because:Inpatient level of care appropriate due to severity of illness  Dispo: The patient is from: SNF              Anticipated d/c is to: SNF              Patient currently is not medically stable to d/c.   Difficult to place patient No      Consultants: Urology  Procedures: None yet  Antimicrobials:  Anti-infectives (From admission, onward)    Start     Dose/Rate Route Frequency Ordered Stop   01/03/21 1000  ceFEPIme (MAXIPIME) 2 g  in sodium chloride 0.9 % 100 mL IVPB        2 g 200 mL/hr over 30 Minutes Intravenous Every 12 hours 01/02/21 2231     01/02/21 2130  ceFEPIme (MAXIPIME) 2 g in sodium chloride 0.9 % 100 mL  IVPB        2 g 200 mL/hr over 30 Minutes Intravenous  Once 01/02/21 2123 01/02/21 2329       Subjective:  Patient seen and examined at the bedside this morning.  Hemodynamically stable during my evaluation.  He was alert and oriented and answered most of my questions.  He denies any abdominal pain.  Foley was placed in the emergency department and it was draining clear urine.  Not in any kind of distress.   Objective: Vitals:   01/03/21 0130 01/03/21 0200 01/03/21 0431 01/03/21 0730  BP: 98/82 106/70 132/79 111/76  Pulse: 89 84 83 81  Resp: 18 18 16 18   Temp:      TempSrc:      SpO2: 94% 100% 98% 94%  Weight:      Height:        Intake/Output Summary (Last 24 hours) at 01/03/2021 0818 Last data filed at 01/02/2021 2329 Gross per 24 hour  Intake 99 ml  Output --  Net 99 ml   Filed Weights   01/02/21 1757  Weight: 57 kg    Examination:  General exam: Overall comfortable, not in distress, very pleasant, deconditioned thin built elderly male HEENT: PERRL Respiratory system:  no wheezes or crackles  Cardiovascular system: S1 & S2 heard, RRR.  Gastrointestinal system: Abdomen is nondistended, soft and nontender. Central nervous system: Alert and oriented Extremities: No edema, no clubbing ,no cyanosis Skin: No rashes, no ulcers,no icterus GU: Foley catheter       Data Reviewed: I have personally reviewed following labs and imaging studies  CBC: Recent Labs  Lab 12/28/20 1039 01/02/21 1811 01/03/21 0500  WBC 6.4 8.0 8.1  NEUTROABS  --  5.3  --   HGB 9.5* 10.4* 9.7*  HCT 31.4* 35.7* 33.0*  MCV 85.3 87.5 86.4  PLT 294 298 262   Basic Metabolic Panel: Recent Labs  Lab 12/28/20 1039 01/02/21 1811 01/03/21 0500  NA 146* 151* 149*  K 3.7 3.8 3.1*  CL 112* 118* 119*  CO2 22 24 23   GLUCOSE 77 164* 396*  BUN 28* 28* 28*  CREATININE 1.42* 1.22 0.91  CALCIUM 8.7* 9.0 7.7*   GFR: Estimated Creatinine Clearance: 45.2 mL/min (by C-G formula based on SCr of  0.91 mg/dL). Liver Function Tests: Recent Labs  Lab 01/02/21 1811  AST 15  ALT 11  ALKPHOS 48  BILITOT 0.5  PROT 6.2*  ALBUMIN 2.2*   No results for input(s): LIPASE, AMYLASE in the last 168 hours. No results for input(s): AMMONIA in the last 168 hours. Coagulation Profile: No results for input(s): INR, PROTIME in the last 168 hours. Cardiac Enzymes: No results for input(s): CKTOTAL, CKMB, CKMBINDEX, TROPONINI in the last 168 hours. BNP (last 3 results) No results for input(s): PROBNP in the last 8760 hours. HbA1C: No results for input(s): HGBA1C in the last 72 hours. CBG: No results for input(s): GLUCAP in the last 168 hours. Lipid Profile: No results for input(s): CHOL, HDL, LDLCALC, TRIG, CHOLHDL, LDLDIRECT in the last 72 hours. Thyroid Function Tests: No results for input(s): TSH, T4TOTAL, FREET4, T3FREE, THYROIDAB in the last 72 hours. Anemia Panel: No results for input(s): VITAMINB12, FOLATE, FERRITIN, TIBC, IRON, RETICCTPCT  in the last 72 hours. Sepsis Labs: No results for input(s): PROCALCITON, LATICACIDVEN in the last 168 hours.  Recent Results (from the past 240 hour(s))  SARS CORONAVIRUS 2 (TAT 6-24 HRS) Nasopharyngeal Nasopharyngeal Swab     Status: None   Collection Time: 12/27/20  4:59 PM   Specimen: Nasopharyngeal Swab  Result Value Ref Range Status   SARS Coronavirus 2 NEGATIVE NEGATIVE Final    Comment: (NOTE) SARS-CoV-2 target nucleic acids are NOT DETECTED.  The SARS-CoV-2 RNA is generally detectable in upper and lower respiratory specimens during the acute phase of infection. Negative results do not preclude SARS-CoV-2 infection, do not rule out co-infections with other pathogens, and should not be used as the sole basis for treatment or other patient management decisions. Negative results must be combined with clinical observations, patient history, and epidemiological information. The expected result is Negative.  Fact Sheet for  Patients: HairSlick.no  Fact Sheet for Healthcare Providers: quierodirigir.com  This test is not yet approved or cleared by the [place] FDA and  has been authorized for detection and/or diagnosis of SARS-CoV-2 by FDA under an Emergency Use Authorization (EUA). This EUA will remain  in effect (meaning this test can be used) for the duration of the COVID-19 declaration under Se ction 564(b)(1) of the Act, 21 U.S.C. section 360bbb-3(b)(1), unless the authorization is terminated or revoked sooner.  Performed at [facility], [address] [place], [place] [address]          Radiology Studies: CT Renal Stone Study  Result Date: 01/02/2021 CLINICAL DATA:  Urinary tract calculus EXAM: CT ABDOMEN AND PELVIS WITHOUT CONTRAST TECHNIQUE: Multidetector CT imaging of the abdomen and pelvis was performed following the standard protocol without IV contrast. COMPARISON:  12/14/2020 FINDINGS: Lower chest: Small right pleural effusion has developed in the interim. No acute airspace disease. Hepatobiliary: High attenuation material within the gallbladder likely reflects gallbladder sludge. No calcified gallstones or cholecystitis. Unenhanced imaging of the liver is unremarkable. Pancreas: Unremarkable. No pancreatic ductal dilatation or surrounding inflammatory changes. Spleen: Normal in size without focal abnormality. Adrenals/Urinary Tract: A large obstructing calculus at the right UVJ is again identified, measuring up to 3.3 x 1.4 cm. The right ureteral stent seen on prior study is again identified, proximal aspect coiled within the right UPJ and the distal aspect coiled within the distal right ureter and right UVJ abutting the calculus. There is been recurrence of severe high-grade right-sided obstructive uropathy with marked hydronephrosis and hydroureter. Portion configuration of the kidneys again noted, with marked atrophy of the  right-sided moiety. Chronic dilation of the left renal pelvis and proximal left ureter without clear cause for obstruction. The adrenals are unremarkable. There is gas in the bladder lumen. A Foley catheter is identified within the prostatic urethra. The balloon is inflated within the region of the membranous urethra. Stomach/Bowel: No bowel obstruction or ileus. Distal colonic diverticulosis without diverticulitis. No bowel [place] thickening or inflammatory change. Vascular/Lymphatic: Aortic atherosclerosis. No enlarged abdominal or pelvic lymph nodes. Reproductive: The prostate is enlarged but stable. Other: No free fluid or free gas.  No abdominal Maruyama hernia. Musculoskeletal: No acute or destructive bony lesions. Chronic L1 compression deformity unchanged. Reconstructed images demonstrate no additional findings. IMPRESSION: 1. Recurrent severe right-sided hydronephrosis and hydroureter related to an obstructing 3.3 cm right UVJ calculus. No significant change in the position of the right ureteral stent, extending from the right UPJ to the region of the right UVJ. 2. Malpositioning of the Foley catheter,  with the balloon inflated within the prostatic/membranous urethra. Recommend removal and replacement. 3. Stable nonobstructing right renal calculi. 4. Horseshoe configuration of the kidneys, with marked atrophy of the right moiety. 5. Small right pleural effusion. 6. Gallbladder sludge. No evidence of cholelithiasis or cholecystitis. 7.  Aortic Atherosclerosis (ICD10-I70.0). Critical Value/emergent results were called by telephone at the time of interpretation on 01/02/2021 at 8:43 pm to provider [facility] , who verbally acknowledged these results. Electronically Signed   By: Sharlet Salina M.D.   On: 01/02/2021 20:46        Scheduled Meds:  amLODipine  5 mg Oral Daily   tamsulosin  0.4 mg Oral Daily   Continuous Infusions:  sodium chloride Stopped (01/02/21 2244)   ceFEPime (MAXIPIME) IV      dextrose 5 % and 0.45% NaCl 100 mL/hr at 01/02/21 2332     LOS: 1 day    Time spent: 35 mins.More than 50% of that time was spent in counseling and/or coordination of care.      Burnadette Pop, MD [facility] P9/27/2022, 8:18 AM

## 2021-01-03 NOTE — Transfer of Care (Signed)
Immediate Anesthesia Transfer of Care Note  Patient: Corey Cummings  Procedure(s) Performed: CYSTOSCOPY LITHOTRIPSY WITH RETROGRADE PYELOGRAM/ STENT REMOVAL (Right: Bladder)  Patient Location: PACU  Anesthesia Type:General  Level of Consciousness: drowsy and patient cooperative  Airway & Oxygen Therapy: Patient Spontanous Breathing and Patient connected to face mask oxygen  Post-op Assessment: Report given to RN and Post -op Vital signs reviewed and stable  Post vital signs: Reviewed and stable  Last Vitals:  Vitals Value Taken Time  BP 128/74 01/03/21 1523  Temp 35.8 C 01/03/21 1523  Pulse 79 01/03/21 1530  Resp 12 01/03/21 1530  SpO2 100 % 01/03/21 1530  Vitals shown include unvalidated device data.  Last Pain:  Vitals:   01/03/21 1245  TempSrc: Axillary  PainSc:       Patients Stated Pain Goal: 0 (01/02/21 1756)  Complications: No notable events documented.

## 2021-01-03 NOTE — Anesthesia Procedure Notes (Signed)
Procedure Name: LMA Insertion Date/Time: 01/03/2021 2:19 AM Performed by: Donna Bernard, CRNA Pre-anesthesia Checklist: Patient identified, Emergency Drugs available, Suction available, Patient being monitored and Timeout performed Patient Re-evaluated:Patient Re-evaluated prior to induction Oxygen Delivery Method: Circle system utilized Preoxygenation: Pre-oxygenation with 100% oxygen Induction Type: IV induction Ventilation: Mask ventilation without difficulty LMA: LMA inserted LMA Size: 4.0 Number of attempts: 1 Tube secured with: Tape Dental Injury: Teeth and Oropharynx as per pre-operative assessment

## 2021-01-03 NOTE — ED Notes (Signed)
MD at bedside to reposition foley.

## 2021-01-03 NOTE — ED Notes (Signed)
Admitting MD at bedside.

## 2021-01-03 NOTE — Op Note (Signed)
Operative Note  Preoperative diagnosis:  1.  Bladder calculus, hydronephrosis  Postoperative diagnosis: 1.  Bladder calclulus, hydronephrosis  Procedure(s): 1.  Cystolithotripsy 2.  Right retrograde pyelogram 3.  Removal of right ureteral stent  Surgeon: Kasandra Knudsen, MD  Assistants:  None  Anesthesia:  General  Complications:  None  EBL:  minimal  Specimens: 1. Bladder calculi  Drains/Catheters: 1.  20Fr coude foley   Intraoperative findings:   Normal anterior urethra Trilobar prostatic urethral Orthotopic right ureteral orifice seen with stent emanating from it Unable to clearly see left ureteral orifice Severe bladder trabeculations 3 cm bladder stone seen dependent part of bladder  right retrograde pyelogram with severe hydronephrosis and tortuosity however no obstruction seen  Indication:  Corey Cummings is a 87 y.o. male with 3 cm bladder stone, malpositioned foley, right renal atropy with hydronephrosis and left hydronephrosis.  Description of procedure: After risks and benefits of the above procedure were discussed with the patient and his family members informed consent was obtained.  The patient is taken to the operating placed in supine position.  Anesthesia was induced.  Patient received antibiotics earlier on the floor.  He was then repositioned in the dorsolithotomy position.  He was prepped and draped in the usual sterile fashion and timeout was performed.  A 23 French rigid cystoscope was placed in the urethral meatus and advanced in the bladder and direct visualization.  Patient was noted to have lateral lobe prostatic hypertrophy with severe bladder trabeculations.  An existing right ureteral stent was seen emanating from the right ureteral orifice.  A large bladder calculus was seen in the dependent part of the right side bladder on top of the stent.  A 990 m holmium laser fiber was then used to fragment the stone.  All the fragments were removed from  the bladder with Toomey syringe and irrigation.  It was difficult to see the left ureteral orifice.  The right ureteral orifice was easily seen with the stent emanating from it.  Graspers were then used to bring the stent to the urethral meatus.  Attempts at placing a wire through the stent were unsuccessful.  The stent was then removed.  Next an open-ended ureteral catheter was easily placed in the right ureteral orifice.  Retrograde pyelogram was obtained which showed severe hydronephrosis with tortuous ureter.  There is no noted obstruction.  Further inspection of the bladder revealed no other abnormality.  The cystoscope was removed.  A 20 French coud Foley catheter was then placed.  15 cc sterile water placed in the balloon.  The patient was awakened from anesthesia and transferred the PACU in stable condition.    Plan:   Trasfer back to floor for management of hypernatremia.  Outpatient urology follow up.  Continue foley catheter.

## 2021-01-03 NOTE — Anesthesia Preprocedure Evaluation (Signed)
Anesthesia Evaluation  Patient identified by MRN, date of birth, ID band Patient awake    Reviewed: Allergy & Precautions, NPO status , Patient's Chart, lab work & pertinent test results  Airway Mallampati: II  TM Distance: >3 FB     Dental   Pulmonary former smoker,    breath sounds clear to auscultation       Cardiovascular hypertension,  Rhythm:Regular Rate:Normal     Neuro/Psych    GI/Hepatic negative GI ROS, Neg liver ROS,   Endo/Other    Renal/GU Renal disease     Musculoskeletal   Abdominal   Peds  Hematology   Anesthesia Other Findings   Reproductive/Obstetrics                             Anesthesia Physical Anesthesia Plan  ASA: 3  Anesthesia Plan: General   Post-op Pain Management:    Induction: Intravenous  PONV Risk Score and Plan: 2 and Ondansetron, Dexamethasone and Midazolam  Airway Management Planned: LMA  Additional Equipment:   Intra-op Plan:   Post-operative Plan: Extubation in OR  Informed Consent: I have reviewed the patients History and Physical, chart, labs and discussed the procedure including the risks, benefits and alternatives for the proposed anesthesia with the patient or authorized representative who has indicated his/her understanding and acceptance.     Dental advisory given  Plan Discussed with: Anesthesiologist and CRNA  Anesthesia Plan Comments:         Anesthesia Quick Evaluation

## 2021-01-03 NOTE — Consult Note (Signed)
I have been asked to see the patient by Dr. Kristine Royal, for evaluation and management of hydronephrosis and malpositioned foley.  History of present illness: 87 yo man with medical history significant for HTN, chronic indwelling Foley catheter, nephrolithiasis, ED/PE on Xarelto, and BPH presents with complaints of being noted to be acutely altered. CT scan noted to have recurrence of severe right hydro, left moderate hydro, and malpositioned foley and questionable position of right ureteral stent.  Review of systems: Pt difficult to understand  Patient Active Problem List   Diagnosis Date Noted   Hydronephrosis of right kidney 01/02/2021   Complicated UTI (urinary tract infection) 01/02/2021   Hypernatremia 01/02/2021   Essential hypertension 01/02/2021   Protein-calorie malnutrition, severe 12/29/2020   Pressure injury of skin 12/24/2020   Acute metabolic encephalopathy 12/14/2020   Hypokalemia 12/14/2020   History of DVT (deep vein thrombosis) 12/14/2020   History of pulmonary embolus (PE) 12/14/2020   Severe sepsis (HCC) 12/04/2020   Lactic acidosis 12/04/2020   Acute cystitis without hematuria 12/04/2020   Foley catheter problem (HCC) 12/04/2020   Unilateral recurrent inguinal hernia without obstruction or gangrene 12/04/2020   Acute pyelonephritis 12/04/2020   BPH (benign prostatic hyperplasia) 12/04/2020   Chronic indwelling Foley catheter 12/04/2020   Hypertensive urgency 12/04/2020   CKD (chronic kidney disease), stage III (HCC) 12/04/2020   Acute pulmonary embolism (HCC) 02/24/2020   Ureteral obstruction, right 02/24/2020   Urinary tract infection associated with catheterization of urinary tract, initial encounter (HCC) 02/24/2020   Renal insufficiency 02/24/2020   Thoracic aortic aneurysm without rupture (HCC) 02/24/2020   Bronchiectasis (HCC) 02/24/2020   Pulmonary embolism (HCC) 02/24/2020   Hydronephrosis with urinary obstruction due to ureteral calculus     No  current facility-administered medications on file prior to encounter.   Current Outpatient Medications on File Prior to Encounter  Medication Sig Dispense Refill   acidophilus (RISAQUAD) CAPS capsule Take 1 capsule by mouth daily.     amLODipine (NORVASC) 5 MG tablet Take 1 tablet (5 mg total) by mouth daily. 30 tablet 1   feeding supplement (ENSURE ENLIVE / ENSURE PLUS) LIQD Take 237 mLs by mouth 2 (two) times daily between meals. 237 mL 12   finasteride (PROSCAR) 5 MG tablet Take 5 mg by mouth at bedtime.     Iron-FA-B Cmp-C-Biot-Probiotic (FUSION PLUS) CAPS Take 1 capsule by mouth daily.     Multiple Vitamin (MULTIVITAMIN WITH MINERALS) TABS tablet Take 1 tablet by mouth daily.     OLANZapine zydis (ZYPREXA) 5 MG disintegrating tablet Take 0.5 tablets (2.5 mg total) by mouth at bedtime as needed (agitation).     tamsulosin (FLOMAX) 0.4 MG CAPS capsule Take 0.4 mg by mouth daily.     thiamine 100 MG tablet Take 1 tablet (100 mg total) by mouth daily. 30 tablet 0   rivaroxaban (XARELTO) 20 MG TABS tablet Take 20 mg by mouth daily with supper. (Patient not taking: No sig reported)      Past Medical History:  Diagnosis Date   BPH (benign prostatic hyperplasia)    Hypertension     Past Surgical History:  Procedure Laterality Date   APPENDECTOMY     CYSTOSCOPY W/ URETERAL STENT PLACEMENT Right 12/04/2020   Procedure: CYSTOSCOPY WITH RETROGRADE PYELOGRAM/URETERAL STENT PLACEMENT;  Surgeon: Jannifer Hick, MD;  Location: [facility];  Service: Urology;  Laterality: Right;    Social History   Tobacco Use   Smoking status: Former    Types: Cigarettes   Smokeless  tobacco: Never   Tobacco comments:    Quit 40 years ago  Substance Use Topics   Alcohol use: Not Currently   Drug use: Not Currently    History reviewed. No pertinent family history.  PE: Vitals:   01/03/21 0100 01/03/21 0130 01/03/21 0200 01/03/21 0431  BP: 106/70 98/82 106/70 132/79  Pulse: 86 89 84 83  Resp: 19 18 18 16    Temp:      TempSrc:      SpO2: 95% 94% 100% 98%  Weight:      Height:       Patient appears to be in no acute distress  patient is sleeping but arouseable Atraumatic normocephalic head No cervical or supraclavicular lymphadenopathy appreciated No increased work of breathing, no audible wheezes/rhonchi Regular sinus rhythm/rate Abdomen is soft, nontender GU: uncirc phallus, 16Fr foley with yellow urine in bag Lower extremities are symmetric without appreciable edema Grossly neurologically intact No identifiable skin lesions  Recent Labs    01/02/21 1811 01/03/21 0500  WBC 8.0 8.1  HGB 10.4* 9.7*  HCT 35.7* 33.0*   Recent Labs    01/02/21 1811 01/03/21 0500  NA 151* 149*  K 3.8 3.1*  CL 118* 119*  CO2 24 23  GLUCOSE 164* 396*  BUN 28* 28*  CREATININE 1.22 0.91  CALCIUM 9.0 7.7*   No results for input(s): LABPT, INR in the last 72 hours. No results for input(s): LABURIN in the last 72 hours. Results for orders placed or performed during the hospital encounter of 12/14/20  Urine Culture     Status: Abnormal   Collection Time: 12/14/20  6:55 AM   Specimen: Urine, Clean Catch  Result Value Ref Range Status   Specimen Description URINE, CLEAN CATCH  Final   Special Requests   Final    Normal Performed at [facility], [address] [place], [place]    Culture 80,000 COLONIES/mL YEAST (A)  Final   Report Status 12/15/2020 FINAL  Final  Urine Culture     Status: None   Collection Time: 12/22/20 10:52 AM   Specimen: Urine, Catheterized  Result Value Ref Range Status   Specimen Description URINE, CATHETERIZED  Final   Special Requests NONE  Final   Culture   Final    NO GROWTH Performed at [facility], [address] [place], [place]    Report Status 12/23/2020 FINAL  Final  SARS CORONAVIRUS 2 (TAT 6-24 HRS) Nasopharyngeal Nasopharyngeal Swab     Status: None   Collection Time: 12/27/20  4:59 PM   Specimen: Nasopharyngeal  Swab  Result Value Ref Range Status   SARS Coronavirus 2 NEGATIVE NEGATIVE Final    Comment: (NOTE) SARS-CoV-2 target nucleic acids are NOT DETECTED.  The SARS-CoV-2 RNA is generally detectable in upper and lower respiratory specimens during the acute phase of infection. Negative results do not preclude SARS-CoV-2 infection, do not rule out co-infections with other pathogens, and should not be used as the sole basis for treatment or other patient management decisions. Negative results must be combined with clinical observations, patient history, and epidemiological information. The expected result is Negative.  Fact Sheet for Patients: 12/29/20  Fact Sheet for Healthcare Providers: HairSlick.no  This test is not yet approved or cleared by the quierodirigir.com FDA and  has been authorized for detection and/or diagnosis of SARS-CoV-2 by FDA under an Emergency Use Authorization (EUA). This EUA will remain  in effect (meaning this test can  be used) for the duration of the COVID-19 declaration under Se ction 564(b)(1) of the Act, 21 U.S.C. section 360bbb-3(b)(1), unless the authorization is terminated or revoked sooner.  Performed at [facility], [address], [place], [place] [address]     Imaging: CT Abd/Pelvis 01/03/21 IMPRESSION: 1. Recurrent severe right-sided hydronephrosis and hydroureter related to an obstructing 3.3 cm right UVJ calculus. No significant change in the position of the right ureteral stent, extending from the right UPJ to the region of the right UVJ. 2. Malpositioning of the Foley catheter, with the balloon inflated within the prostatic/membranous urethra. Recommend removal and replacement. 3. Stable nonobstructing right renal calculi. 4. Horseshoe configuration of the kidneys, with marked atrophy of the right moiety. 5. Small right pleural effusion. 6. Gallbladder sludge. No evidence  of cholelithiasis or cholecystitis. 7.  Aortic Atherosclerosis (ICD10-I70.0).   Critical Value/emergent results were called by telephone at the time of interpretation on 01/02/2021 at 8:43 pm to provider [facility] , who verbally acknowledged these results.     Electronically Signed   By: Sharlet Salina M.D.   On: 01/02/2021 20:46   Imp/Recommendations: BPH with chronic urinary retention: Foley catheter was removed and new 20Fr coude foley replaced by me using sterile technique. Immediate drainage of 450 cc yellow urine.  Right renal atrophy with indwelling stent: previous UVJ calculus had migrated into bladder, discuss possibly removing right ureteral stent in OR today Bladder calculus: pt scheduled for cystolithotripsy this afternoon Malpositioned foley: foley exchanged today  Left hydronephrosis: likely do to malpositioned foley, can obtain renal US in 1-2 weeks to see if improved  -plan for OR this afternoon for right stent removal and cystolithotripsy -spoke with pt sister and Dr. Cardell Peach  Thank you for involving me in this patient's care.  Please page with any further questions or concerns. Mazell Aylesworth D Thresea Doble

## 2021-01-03 NOTE — Anesthesia Postprocedure Evaluation (Signed)
Anesthesia Post Note  Patient: Corey Cummings  Procedure(s) Performed: CYSTOSCOPY LITHOTRIPSY WITH RETROGRADE PYELOGRAM/ STENT REMOVAL (Right: Bladder)     Patient location during evaluation: PACU Anesthesia Type: General Level of consciousness: awake Pain management: pain level controlled Vital Signs Assessment: post-procedure vital signs reviewed and stable Cardiovascular status: stable Postop Assessment: no apparent nausea or vomiting Anesthetic complications: no   No notable events documented.  Last Vitals:  Vitals:   01/03/21 1600 01/03/21 1628  BP: 120/67 (!) 151/60  Pulse: 81 93  Resp: 14 16  Temp: 36.6 C (!) 36.4 C  SpO2: 95% 92%    Last Pain:  Vitals:   01/03/21 1630  TempSrc:   PainSc: 0-No pain                 Saje Gallop

## 2021-01-03 NOTE — ED Notes (Signed)
OR taking pt to PACU. Daughter at bedside and aware of pt transfer. Floor made aware pt is going to PACU now.

## 2021-01-04 ENCOUNTER — Other Ambulatory Visit: Payer: Self-pay

## 2021-01-04 ENCOUNTER — Encounter (HOSPITAL_COMMUNITY): Payer: Self-pay | Admitting: Urology

## 2021-01-04 DIAGNOSIS — G9341 Metabolic encephalopathy: Secondary | ICD-10-CM | POA: Diagnosis not present

## 2021-01-04 DIAGNOSIS — I10 Essential (primary) hypertension: Secondary | ICD-10-CM

## 2021-01-04 DIAGNOSIS — E870 Hyperosmolality and hypernatremia: Secondary | ICD-10-CM | POA: Diagnosis not present

## 2021-01-04 DIAGNOSIS — N1330 Unspecified hydronephrosis: Secondary | ICD-10-CM | POA: Diagnosis not present

## 2021-01-04 LAB — CBC WITH DIFFERENTIAL/PLATELET
Abs Immature Granulocytes: 0.02 10*3/uL (ref 0.00–0.07)
Basophils Absolute: 0 10*3/uL (ref 0.0–0.1)
Basophils Relative: 0 %
Eosinophils Absolute: 0 10*3/uL (ref 0.0–0.5)
Eosinophils Relative: 0 %
HCT: 28.4 % — ABNORMAL LOW (ref 39.0–52.0)
Hemoglobin: 8.5 g/dL — ABNORMAL LOW (ref 13.0–17.0)
Immature Granulocytes: 0 %
Lymphocytes Relative: 12 %
Lymphs Abs: 0.7 10*3/uL (ref 0.7–4.0)
MCH: 25.5 pg — ABNORMAL LOW (ref 26.0–34.0)
MCHC: 29.9 g/dL — ABNORMAL LOW (ref 30.0–36.0)
MCV: 85.3 fL (ref 80.0–100.0)
Monocytes Absolute: 0.2 10*3/uL (ref 0.1–1.0)
Monocytes Relative: 4 %
Neutro Abs: 4.8 10*3/uL (ref 1.7–7.7)
Neutrophils Relative %: 84 %
Platelets: 197 10*3/uL (ref 150–400)
RBC: 3.33 MIL/uL — ABNORMAL LOW (ref 4.22–5.81)
RDW: 17.8 % — ABNORMAL HIGH (ref 11.5–15.5)
WBC: 5.7 10*3/uL (ref 4.0–10.5)
nRBC: 0 % (ref 0.0–0.2)

## 2021-01-04 LAB — BASIC METABOLIC PANEL
Anion gap: 5 (ref 5–15)
BUN: 24 mg/dL — ABNORMAL HIGH (ref 8–23)
CO2: 22 mmol/L (ref 22–32)
Calcium: 8.2 mg/dL — ABNORMAL LOW (ref 8.9–10.3)
Chloride: 117 mmol/L — ABNORMAL HIGH (ref 98–111)
Creatinine, Ser: 0.74 mg/dL (ref 0.61–1.24)
GFR, Estimated: >60 mL/min (ref >60)
Glucose, Bld: 223 mg/dL — ABNORMAL HIGH (ref 70–99)
Potassium: 4.1 mmol/L (ref 3.5–5.1)
Sodium: 144 mmol/L (ref 135–145)

## 2021-01-04 LAB — SURGICAL PCR SCREEN
MRSA, PCR: NEGATIVE
Staphylococcus aureus: NEGATIVE

## 2021-01-04 LAB — MAGNESIUM: Magnesium: 1.7 mg/dL (ref 1.7–2.4)

## 2021-01-04 MED ORDER — FINASTERIDE 5 MG PO TABS: 5.0000 mg | ORAL_TABLET | Freq: Every day | ORAL | Status: DC

## 2021-01-04 MED ORDER — MUPIROCIN 2 % EX OINT
1.0000 "application " | TOPICAL_OINTMENT | Freq: Two times a day (BID) | CUTANEOUS | Status: AC
  Administered 2021-01-04 – 2021-01-07 (×8): 1 via NASAL
  Filled 2021-01-04 (×2): qty 22

## 2021-01-04 MED ORDER — PROSOURCE PLUS PO LIQD
30.0000 mL | Freq: Every day | ORAL | Status: DC
  Administered 2021-01-04 – 2021-01-07 (×4): 30 mL via ORAL
  Filled 2021-01-04 (×4): qty 30

## 2021-01-04 MED ORDER — ASCORBIC ACID 500 MG PO TABS
250.0000 mg | ORAL_TABLET | Freq: Every day | ORAL | Status: DC
  Administered 2021-01-04 – 2021-01-09 (×5): 250 mg via ORAL
  Filled 2021-01-04 (×5): qty 1

## 2021-01-04 MED ORDER — ADULT MULTIVITAMIN W/MINERALS CH
1.0000 | ORAL_TABLET | Freq: Every day | ORAL | Status: DC
  Administered 2021-01-04 – 2021-01-09 (×5): 1 via ORAL
  Filled 2021-01-04 (×5): qty 1

## 2021-01-04 MED ORDER — ENSURE ENLIVE PO LIQD
237.0000 mL | Freq: Two times a day (BID) | ORAL | Status: DC
  Administered 2021-01-04 – 2021-01-09 (×10): 237 mL via ORAL

## 2021-01-04 MED ORDER — ZINC SULFATE 220 (50 ZN) MG PO CAPS
220.0000 mg | ORAL_CAPSULE | Freq: Every day | ORAL | Status: DC
  Administered 2021-01-04 – 2021-01-09 (×5): 220 mg via ORAL
  Filled 2021-01-04 (×6): qty 1

## 2021-01-04 NOTE — Evaluation (Signed)
Physical Therapy Evaluation Patient Details Name: Corey Cummings MRN: [id number] DOB: 1930-09-02 Today's Date: 01/04/2021  History of Present Illness  87 y.o. male admitted from SNF for confusion, elevated sodium level. Pt s/p 1. Bladder stone s/p cystolitholitripsy 01/03/2021  2. Chronic right hydro with minimal parenchyma. S/p R RPG and R stent removal 01/03/2021 as no obstruction identified per urology. Pt with recent admission on 12/14/2020 with AMS at [facility] and also hospitalized 8/28-8/31 with sepsis 2/2 UTI. PMH includes HTN, chronic indwelling Foley catheter, nephrolithiasis, ED/PE on Xarelto, and BPH.  Clinical Impression  Pt admitted with above diagnosis. Pt currently with functional limitations due to the deficits listed below (see PT Problem List). Pt will benefit from skilled PT to increase their independence and safety with mobility to allow discharge to the venue listed below.  Pt sleepy today however awakened for therapy.  Pt requiring multimodal cues and increased assist for mobility.  Pt reportedly ambulating with PT this past weekend at SNF per family member in room.  Recommend pt return to SNF for rehab upon d/c.      Recommendations for follow up therapy are one component of a multi-disciplinary discharge planning process, led by the attending physician.  Recommendations may be updated based on patient status, additional functional criteria and insurance authorization.  Follow Up Recommendations SNF;Supervision/Assistance - 24 hour    Equipment Recommendations  3in1 (PT);Wheelchair (measurements PT);Hospital bed    Recommendations for Other Services       Precautions / Restrictions Precautions Precautions: Fall Precaution Comments: R sided lean in sitting and standing (also noted from last admission)      Mobility  Bed Mobility Overal bed mobility: Needs Assistance Bed Mobility: Rolling;Sidelying to Sit Rolling: Mod assist Sidelying to sit: Max assist       General bed  mobility comments: pt with difficulty initiating, multimodal cues provided    Transfers Overall transfer level: Needs assistance Equipment used: Rolling walker (2 wheeled) Transfers: Sit to/from [employer] Sit to Stand: Mod assist Stand pivot transfers: Min assist       General transfer comment: difficulty sequencing. multimodal cueing needed. assist for weakness and impaired balance  Ambulation/Gait                Stairs            Wheelchair Mobility    Modified Rankin (Stroke Patients Only)       Balance Overall balance assessment: Needs assistance Sitting-balance support: Bilateral upper extremity supported;Feet supported Sitting balance-Leahy Scale: Poor Sitting balance - Comments: once pt assisted to sitting, able to maintain with UE support   Standing balance support: Bilateral upper extremity supported Standing balance-Leahy Scale: Zero                               Pertinent Vitals/Pain Pain Assessment: Faces Faces Pain Scale: No hurt Pain Intervention(s): Monitored during session;Repositioned    Home Living Family/patient expects to be discharged to:: Skilled nursing facility Living Arrangements:  (was living with daughter prior to last admission) Available Help at Discharge: Family;Available PRN/intermittently;Neighbor Type of Home: House Home Access: Stairs to enter   [employer] of Steps: 2 Home Layout: One level Home Equipment: Walker - 2 wheels;Cane - single point;Grab bars - tub/shower;Shower seat Additional Comments: above information from last admission, pt discharged to SNF approx a week ago and readmitted 9/26    Prior Function Level of Independence: Needs assistance   Gait /  Transfers Assistance Needed: previous to last admission, pt was independent; currently requiring increased assist to mobilize, able to ambulate short distance at SNF with PT           Hand Dominance   Dominant  Hand: Right    Extremity/Trunk Assessment   Upper Extremity Assessment Upper Extremity Assessment: Generalized weakness    Lower Extremity Assessment Lower Extremity Assessment: Generalized weakness    Cervical / Trunk Assessment Cervical / Trunk Assessment: Kyphotic  Communication   Communication: Expressive difficulties (pt without dentures today and difficult to understand)  Cognition Arousal/Alertness: Lethargic Behavior During Therapy: Flat affect Overall Cognitive Status: Impaired/Different from baseline                         Following Commands: Follows one step commands inconsistently;Follows one step commands with increased time Safety/Judgement: Decreased awareness of deficits;Decreased awareness of safety   Problem Solving: Slow processing;Decreased initiation;Difficulty sequencing;Requires verbal cues;Requires tactile cues General Comments: pt very sleepy and quick to close eyes without cues or stimulation; pt assisted to recliner, blinds opened and lights/tv on end of session      General Comments      Exercises     Assessment/Plan    PT Assessment Patient needs continued PT services  PT Problem List Decreased strength;Decreased activity tolerance;Decreased balance;Decreased mobility;Decreased cognition;Decreased safety awareness;Decreased knowledge of use of DME       PT Treatment Interventions DME instruction;Gait training;Stair training;Functional mobility training;Therapeutic activities;Therapeutic exercise;Balance training;Cognitive remediation;Neuromuscular re-education;Patient/family education;Wheelchair mobility training    PT Goals (Current goals can be found in the Care Plan section)  Acute Rehab PT Goals PT Goal Formulation: With patient/family Time For Goal Achievement: 01/19/21 Potential to Achieve Goals: Fair    Frequency Min 2X/week   Barriers to discharge        Co-evaluation               AM-PAC PT "6 Clicks"  Mobility  Outcome Measure Help needed turning from your back to your side while in a flat bed without using bedrails?: A Little Help needed moving from lying on your back to sitting on the side of a flat bed without using bedrails?: A Lot Help needed moving to and from a bed to a chair (including a wheelchair)?: A Lot Help needed standing up from a chair using your arms (e.g., wheelchair or bedside chair)?: A Lot Help needed to walk in hospital room?: Total Help needed climbing 3-5 steps with a railing? : Total 6 Click Score: 11    End of Session Equipment Utilized During Treatment: Gait belt Activity Tolerance: Patient tolerated treatment well Patient left: in chair;with call bell/phone within reach (chair alarm not working, Charity fundraiser notified, family in room) Nurse Communication: Mobility status PT Visit Diagnosis: Unsteadiness on feet (R26.81);Muscle weakness (generalized) (M62.81);Difficulty in walking, not elsewhere classified (R26.2)    Time: 1761-6073 PT Time Calculation (min) (ACUTE ONLY): 22 min   Charges:   PT Evaluation $PT Eval Moderate Complexity: 1 Mod        Kati PT, DPT Acute Rehabilitation Services Pager: [phone] Office: [phone]   Janan Halter [place] 01/04/2021, 3:23 PM

## 2021-01-04 NOTE — TOC Initial Note (Signed)
Transition of Care [facility]) - Initial/Assessment Note    Patient Details  Name: Corey Cummings MRN: [id number] Date of Birth: January 21, 1931  Transition of Care [facility]) CM/SW Contact:    Lanier Clam, RN Phone Number: 01/04/2021, 3:53 PM  Clinical Narrative: From [facility]-spoke to dtr Debbie patient to return-will confirm with patient once able to talk to him.                   Expected Discharge Plan: Skilled Nursing Facility Barriers to Discharge: Continued Medical Work up   Patient Goals and CMS Choice Patient states their goals for this hospitalization and ongoing recovery are:: return back CMS Medicare.gov Compare Post Acute Care list provided to:: Patient Represenative (must comment) (dtr [phone]) Choice offered to / list presented to : Adult Children  Expected Discharge Plan and Services Expected Discharge Plan: Skilled Nursing Facility   Discharge Planning Services: CM Consult Post Acute Care Choice: Skilled Nursing Facility Living arrangements for the past 2 months: Skilled Nursing Facility                                      Prior Living Arrangements/Services Living arrangements for the past 2 months: Skilled Nursing Facility Lives with:: Facility Resident Patient language and need for interpreter reviewed:: Yes Do you feel safe going back to the place where you live?: Yes        Care giver support system in place?: Yes (comment)   Criminal Activity/Legal Involvement Pertinent to Current Situation/Hospitalization: No - Comment as needed  Activities of Daily Living Home Assistive Devices/Equipment: Dentures (specify type) (upper and lower dentures per old hx., no family present, pt having trouble talking) ADL Screening (condition at time of admission) Patient's cognitive ability adequate to safely complete daily activities?: No Is the patient deaf or have difficulty hearing?: Yes Does the patient have difficulty seeing, even when wearing  glasses/contacts?: Yes Does the patient have difficulty concentrating, remembering, or making decisions?: Yes Patient able to express need for assistance with ADLs?: Yes (hard to understand pt) Does the patient have difficulty dressing or bathing?: Yes Independently performs ADLs?: No Communication: Independent Dressing (OT): Needs assistance Grooming: Needs assistance Feeding: Needs assistance Bathing: Needs assistance Toileting: Needs assistance In/Out Bed: Needs assistance Walks in Home: Needs assistance Does the patient have difficulty walking or climbing stairs?: Yes Weakness of Legs: Both Weakness of Arms/Hands: Both  Permission Sought/Granted Permission sought to share information with : Case Manager    Share Information with NAME: Case Manager     Permission granted to share info w Relationship: Debbie dtr [id number]     Emotional Assessment Appearance:: Appears stated age Attitude/Demeanor/Rapport: Gracious Affect (typically observed): Accepting Orientation: : Oriented to Self, Oriented to Place, Oriented to  Time, Oriented to Situation Alcohol / Substance Use: Not Applicable Psych Involvement: No (comment)  Admission diagnosis:  Hypernatremia [E87.0] Hydronephrosis of right kidney [N13.30] Hydronephrosis, unspecified hydronephrosis type [N13.30] Patient Active Problem List   Diagnosis Date Noted   Hydronephrosis of right kidney 01/02/2021   Complicated UTI (urinary tract infection) 01/02/2021   Hypernatremia 01/02/2021   Essential hypertension 01/02/2021   Protein-calorie malnutrition, severe 12/29/2020   Pressure injury of skin 12/24/2020   Acute metabolic encephalopathy 12/14/2020   Hypokalemia 12/14/2020   History of DVT (deep vein thrombosis) 12/14/2020   History of pulmonary embolus (PE) 12/14/2020   Severe sepsis (HCC) 12/04/2020  Lactic acidosis 12/04/2020   Acute cystitis without hematuria 12/04/2020   Foley catheter problem (HCC) 12/04/2020    Unilateral recurrent inguinal hernia without obstruction or gangrene 12/04/2020   Acute pyelonephritis 12/04/2020   BPH (benign prostatic hyperplasia) 12/04/2020   Chronic indwelling Foley catheter 12/04/2020   Hypertensive urgency 12/04/2020   CKD (chronic kidney disease), stage III (HCC) 12/04/2020   Acute pulmonary embolism (HCC) 02/24/2020   Ureteral obstruction, right 02/24/2020   Urinary tract infection associated with catheterization of urinary tract, initial encounter (HCC) 02/24/2020   Renal insufficiency 02/24/2020   Thoracic aortic aneurysm without rupture (HCC) 02/24/2020   Bronchiectasis (HCC) 02/24/2020   Pulmonary embolism (HCC) 02/24/2020   Hydronephrosis with urinary obstruction due to ureteral calculus    PCP:  [name], MD Pharmacy:   [facility] [phone] - [place], [place] - [address] [place] [address] Phone: [phone] Fax: [phone]  [facility] [address] [place] [address] Phone: [phone] Fax: [phone]     Social Determinants of Health (SDOH) Interventions    Readmission Risk Interventions No flowsheet data found.

## 2021-01-04 NOTE — Plan of Care (Signed)
  Problem: Education: Goal: Knowledge of General Education information will improve Description: Including pain rating scale, medication(s)/side effects and non-pharmacologic comfort measures Outcome: Adequate for Discharge   

## 2021-01-04 NOTE — Progress Notes (Signed)
1 Day Post-Op Subjective: Denies pain. No nausea or emesis. Afebrile.  Objective: Vital signs in last 24 hours: Temp:  [96.5 F (35.8 C)-97.9 F (36.6 C)] 97.7 F (36.5 C) (09/28 0801) Pulse Rate:  [78-93] 79 (09/28 0801) Resp:  [11-19] 16 (09/28 0801) BP: (106-151)/(60-94) 114/69 (09/28 0801) SpO2:  [92 %-100 %] 96 % (09/28 0801)  Intake/Output from previous day: 09/27 0701 - 09/28 0700 In: 3966.4 [P.O.:120; I.V.:3297.3; IV Piggyback:549.2] Out: 2330 [Urine:2325; Blood:5] Intake/Output this shift: Total I/O In: 120 [P.O.:120] Out: -  UOP: 2.3L clear yellow  Physical Exam:  General: Alert and oriented CV: RRR Lungs: Clear Abdomen: Soft, ND, NT Ext: NT, No erythema  Lab Results: Recent Labs    01/02/21 1811 01/03/21 0500 01/04/21 0452  HGB 10.4* 9.7* 8.5*  HCT 35.7* 33.0* 28.4*   BMET Recent Labs    01/03/21 0500 01/04/21 0452  NA 149* 144  K 3.1* 4.1  CL 119* 117*  CO2 23 22  GLUCOSE 396* 223*  BUN 28* 24*  CREATININE 0.91 0.74  CALCIUM 7.7* 8.2*     Studies/Results: DG C-Arm 1-60 Min-No Report  Result Date: 01/03/2021 Fluoroscopy was utilized by the requesting physician.  No radiographic interpretation.   CT Renal Stone Study  Result Date: 01/02/2021 CLINICAL DATA:  Urinary tract calculus EXAM: CT ABDOMEN AND PELVIS WITHOUT CONTRAST TECHNIQUE: Multidetector CT imaging of the abdomen and pelvis was performed following the standard protocol without IV contrast. COMPARISON:  12/14/2020 FINDINGS: Lower chest: Small right pleural effusion has developed in the interim. No acute airspace disease. Hepatobiliary: High attenuation material within the gallbladder likely reflects gallbladder sludge. No calcified gallstones or cholecystitis. Unenhanced imaging of the liver is unremarkable. Pancreas: Unremarkable. No pancreatic ductal dilatation or surrounding inflammatory changes. Spleen: Normal in size without focal abnormality. Adrenals/Urinary Tract: A large  obstructing calculus at the right UVJ is again identified, measuring up to 3.3 x 1.4 cm. The right ureteral stent seen on prior study is again identified, proximal aspect coiled within the right UPJ and the distal aspect coiled within the distal right ureter and right UVJ abutting the calculus. There is been recurrence of severe high-grade right-sided obstructive uropathy with marked hydronephrosis and hydroureter. Portion configuration of the kidneys again noted, with marked atrophy of the right-sided moiety. Chronic dilation of the left renal pelvis and proximal left ureter without clear cause for obstruction. The adrenals are unremarkable. There is gas in the bladder lumen. A Foley catheter is identified within the prostatic urethra. The balloon is inflated within the region of the membranous urethra. Stomach/Bowel: No bowel obstruction or ileus. Distal colonic diverticulosis without diverticulitis. No bowel Selvidge thickening or inflammatory change. Vascular/Lymphatic: Aortic atherosclerosis. No enlarged abdominal or pelvic lymph nodes. Reproductive: The prostate is enlarged but stable. Other: No free fluid or free gas.  No abdominal Biehler hernia. Musculoskeletal: No acute or destructive bony lesions. Chronic L1 compression deformity unchanged. Reconstructed images demonstrate no additional findings. IMPRESSION: 1. Recurrent severe right-sided hydronephrosis and hydroureter related to an obstructing 3.3 cm right UVJ calculus. No significant change in the position of the right ureteral stent, extending from the right UPJ to the region of the right UVJ. 2. Malpositioning of the Foley catheter, with the balloon inflated within the prostatic/membranous urethra. Recommend removal and replacement. 3. Stable nonobstructing right renal calculi. 4. Horseshoe configuration of the kidneys, with marked atrophy of the right moiety. 5. Small right pleural effusion. 6. Gallbladder sludge. No evidence of cholelithiasis or  cholecystitis. 7.  Aortic  Atherosclerosis (ICD10-I70.0). Critical Value/emergent results were called by telephone at the time of interpretation on 01/02/2021 at 8:43 pm to provider [facility] , who verbally acknowledged these results. Electronically Signed   By: Sharlet Salina M.D.   On: 01/02/2021 20:46    Assessment/Plan: Bladder stone s/p cystolitholitripsy 01/03/2021 Chronic right hydro with minimal parenchyma. S/p R RPG and R stent removal 01/03/2021 as no obstruction identified Malpositioned foley: Resolved. Foley last exchanged 01/03/2021  Leave chronic indwelling foley to gravity Creatinine appropriate at 0.74 Will arrange followup outpatient Following peripherally. Please call with questions.   LOS: 2 days   Matt R. Meeya Goldin MD 01/04/2021, 9:44 AM [facility]  Pager: [phone]

## 2021-01-04 NOTE — Progress Notes (Signed)
PROGRESS NOTE    Corey Cummings  MHD:[id number] DOB: 1930-06-09 DOA: 01/02/2021 PCP: [name], MD   Brief Narrative: Corey Cummings is a 87 y.o. male with a history of hypertension, chronic indwelling Foley catheter, nephrolithiasis, history of PE on Xarelto, BPH.  Patient presented secondary to confusion and was found to have evidence of persistent right-sided urolithiasis and ureteral obstruction with associated right-sided hydronephrosis.  Urology was consulted and performed cystoscopy lithotripsy with removal of right ureteral stent and also replacement of Foley catheter.   Assessment & Plan:   Active Problems:   Ureteral obstruction, right   CKD (chronic kidney disease), stage III (HCC)   Acute metabolic encephalopathy   Hydronephrosis of right kidney   Complicated UTI (urinary tract infection)   Hypernatremia   Essential hypertension   Right sided urolithiasis Ureteral obstruction Right sided hydronephrosis Urology consulted and performed cystolithotripsy and removal of right ureteral stent on 9/27.  Hypernatremia Secondary to poor oral intake. Resolved with IV fluids -Continue IV fluids tonight.  Possible UTI Urinalysis suggests possible infection however urine culture is negative. Patient was started empirically on Cefepime -Discontinue Cefepime  Chronic foley catheter Exchanged 9/28. Continue.  Somnolence Possibly related to anesthesia  Hypokalemia Hypomagnesemia Repleted.  CKD stage IIIa Baseline of 1.2-1.4. Stable.  Acute metabolic encephalopathy Likely related to anesthesia and/or Zyprexa. Confusion prior to admission of unknown etiology but could also be related to Zyprexa versus hypernatremia. Urine culture negative. -Discontinue Zyprexa  Primary hypertension -Continue amlodipine  History of PE On Xarelto as an outpatient which was held secondary to surgery -Restart Xarelto tomorrow  Normocytic anemia Stable.  Severe  malnutrition Dietitian recommendations: - will order Ensure Surgery BID, each supplement provides 330 kcal and 18 grams of protein - will order Magic Cup BID with meals, each supplement provides 290 kcal and 9 grams of protein. - will order 30 ml Prosource Plus once/day, each supplement provides 100 kcal and 15 grams protein.  - will order 1 tablet multivitamin with minerals/day. - recommend 250 mg ascorbic acid BID and 220 mg zinc sulfate/day to promote wound healing.   DVT prophylaxis: SCDs Code Status:   Code Status: DNR Family Communication: Daughter on telephone (15 minutes) Disposition Plan: Discharge likely to SNF in 2-3 days if mental status improves   Consultants:  Urology  Procedures:  CYSTOLITHOTRIPSY/RIGHT RETROGRADE PYELOGRAM/REMOVAL OF RIGHT URETERAL STENT (01/03/2021)  Antimicrobials: Cefepime    Subjective: Patient reports no concerns.  Objective: Vitals:   01/04/21 0420 01/04/21 0801 01/04/21 1257 01/04/21 1529  BP: 121/73 114/69 119/77   Pulse: 81 79 74   Resp: 14 16 16    Temp: 97.9 F (36.6 C) 97.7 F (36.5 C) 97.8 F (36.6 C)   TempSrc: Oral Oral    SpO2: 96% 96% 99% 97%  Weight:      Height:        Intake/Output Summary (Last 24 hours) at 01/04/2021 1610 Last data filed at 01/04/2021 1556 Gross per 24 hour  Intake 2989.49 ml  Output 2550 ml  Net 439.49 ml   Filed Weights   01/02/21 1757  Weight: 57 kg    Examination:  General exam: Appears calm and comfortable  Respiratory system: Clear to auscultation. Respiratory effort normal. Cardiovascular system: S1 & S2 heard, RRR. No murmurs, rubs, gallops or clicks. Gastrointestinal system: Abdomen is nondistended, soft and nontender. No organomegaly or masses felt. Normal bowel sounds heard. Central nervous system: Alert and oriented to person and place. Not oriented to time. Somnolent but easily  arouses. Answers questions appropriately. Follows commands. Musculoskeletal: No edema. No calf  tenderness Skin: No cyanosis. No rashes Psychiatry: Judgement and insight appear impaired.    Data Reviewed: I have personally reviewed following labs and imaging studies  CBC Lab Results  Component Value Date   WBC 5.7 01/04/2021   RBC 3.33 (L) 01/04/2021   HGB 8.5 (L) 01/04/2021   HCT 28.4 (L) 01/04/2021   MCV 85.3 01/04/2021   MCH 25.5 (L) 01/04/2021   PLT 197 01/04/2021   MCHC 29.9 (L) 01/04/2021   RDW 17.8 (H) 01/04/2021   LYMPHSABS 0.7 01/04/2021   MONOABS 0.2 01/04/2021   EOSABS 0.0 01/04/2021   BASOSABS 0.0 01/04/2021     Last metabolic panel Lab Results  Component Value Date   NA 144 01/04/2021   K 4.1 01/04/2021   CL 117 (H) 01/04/2021   CO2 22 01/04/2021   BUN 24 (H) 01/04/2021   CREATININE 0.74 01/04/2021   GLUCOSE 223 (H) 01/04/2021   GFRNONAA >60 01/04/2021   CALCIUM 8.2 (L) 01/04/2021   PROT 6.2 (L) 01/02/2021   ALBUMIN 2.2 (L) 01/02/2021   BILITOT 0.5 01/02/2021   ALKPHOS 48 01/02/2021   AST 15 01/02/2021   ALT 11 01/02/2021   ANIONGAP 5 01/04/2021    CBG (last 3)  Recent Labs    01/03/21 1402 01/03/21 1545  GLUCAP 106* 97     GFR: Estimated Creatinine Clearance: 51.5 mL/min (by C-G formula based on SCr of 0.74 mg/dL).  Coagulation Profile: No results for input(s): INR, PROTIME in the last 168 hours.  Recent Results (from the past 240 hour(s))  SARS CORONAVIRUS 2 (TAT 6-24 HRS) Nasopharyngeal Nasopharyngeal Swab     Status: None   Collection Time: 12/27/20  4:59 PM   Specimen: Nasopharyngeal Swab  Result Value Ref Range Status   SARS Coronavirus 2 NEGATIVE NEGATIVE Final    Comment: (NOTE) SARS-CoV-2 target nucleic acids are NOT DETECTED.  The SARS-CoV-2 RNA is generally detectable in upper and lower respiratory specimens during the acute phase of infection. Negative results do not preclude SARS-CoV-2 infection, do not rule out co-infections with other pathogens, and should not be used as the sole basis for treatment or other  patient management decisions. Negative results must be combined with clinical observations, patient history, and epidemiological information. The expected result is Negative.  Fact Sheet for Patients: HairSlick.no  Fact Sheet for Healthcare Providers: quierodirigir.com  This test is not yet approved or cleared by the [place] FDA and  has been authorized for detection and/or diagnosis of SARS-CoV-2 by FDA under an Emergency Use Authorization (EUA). This EUA will remain  in effect (meaning this test can be used) for the duration of the COVID-19 declaration under Se ction 564(b)(1) of the Act, 21 U.S.C. section 360bbb-3(b)(1), unless the authorization is terminated or revoked sooner.  Performed at [facility], [address] [place], [place] [address]   Urine Culture     Status: None   Collection Time: 01/02/21  6:11 PM   Specimen: Urine, Catheterized  Result Value Ref Range Status   Specimen Description   Final    URINE, CATHETERIZED Performed at [facility], [address] [place], [place] [address]    Special Requests   Final    NONE Performed at [facility], [address], [place], [place] [address]    Culture   Final    NO GROWTH Performed at [facility], [address] [place], [place] [address]    Report Status 01/03/2021 FINAL  Final  SARS Coronavirus 2 by RT PCR (hospital order, performed in [facility] hospital lab) Nasopharyngeal Nasopharyngeal Swab     Status: None   Collection Time: 01/03/21  7:15 AM   Specimen: Nasopharyngeal Swab  Result Value Ref Range Status   SARS Coronavirus 2 NEGATIVE NEGATIVE Final    Comment: (NOTE) SARS-CoV-2 target nucleic acids are NOT DETECTED.  The SARS-CoV-2 RNA is generally detectable in upper and lower respiratory specimens during the acute phase of infection. The lowest concentration of SARS-CoV-2 viral  copies this assay can detect is 250 copies / mL. A negative result does not preclude SARS-CoV-2 infection and should not be used as the sole basis for treatment or other patient management decisions.  A negative result may occur with improper specimen collection / handling, submission of specimen other than nasopharyngeal swab, presence of viral mutation(s) within the areas targeted by this assay, and inadequate number of viral copies (<250 copies / mL). A negative result must be combined with clinical observations, patient history, and epidemiological information.  Fact Sheet for Patients:   BoilerBrush.com.cy  Fact Sheet for Healthcare Providers: https://pope.com/  This test is not yet approved or  cleared by the [place] FDA and has been authorized for detection and/or diagnosis of SARS-CoV-2 by FDA under an Emergency Use Authorization (EUA).  This EUA will remain in effect (meaning this test can be used) for the duration of the COVID-19 declaration under Section 564(b)(1) of the Act, 21 U.S.C. section 360bbb-3(b)(1), unless the authorization is terminated or revoked sooner.  Performed at [facility], [address], [place], [place] [address]   Surgical PCR screen     Status: None   Collection Time: 01/04/21 10:21 AM   Specimen: Nasal Mucosa; Nasal Swab  Result Value Ref Range Status   MRSA, PCR NEGATIVE NEGATIVE Final   Staphylococcus aureus NEGATIVE NEGATIVE Final    Comment: (NOTE) The Xpert SA Assay (FDA approved for NASAL specimens in patients 24 years of age and older), is one component of a comprehensive surveillance program. It is not intended to diagnose infection nor to guide or monitor treatment. Performed at [facility], [address] [place], [place] [address]         Radiology Studies: DG C-Arm 1-60 Min-No Report  Result Date: 01/03/2021 Fluoroscopy was  utilized by the requesting physician.  No radiographic interpretation.   CT Renal Stone Study  Result Date: 01/02/2021 CLINICAL DATA:  Urinary tract calculus EXAM: CT ABDOMEN AND PELVIS WITHOUT CONTRAST TECHNIQUE: Multidetector CT imaging of the abdomen and pelvis was performed following the standard protocol without IV contrast. COMPARISON:  12/14/2020 FINDINGS: Lower chest: Small right pleural effusion has developed in the interim. No acute airspace disease. Hepatobiliary: High attenuation material within the gallbladder likely reflects gallbladder sludge. No calcified gallstones or cholecystitis. Unenhanced imaging of the liver is unremarkable. Pancreas: Unremarkable. No pancreatic ductal dilatation or surrounding inflammatory changes. Spleen: Normal in size without focal abnormality. Adrenals/Urinary Tract: A large obstructing calculus at the right UVJ is again identified, measuring up to 3.3 x 1.4 cm. The right ureteral stent seen on prior study is again identified, proximal aspect coiled within the right UPJ and the distal aspect coiled within the distal right ureter and right UVJ abutting the calculus. There is been recurrence of severe high-grade right-sided obstructive uropathy with marked hydronephrosis and hydroureter. Portion configuration of the kidneys again noted, with marked  atrophy of the right-sided moiety. Chronic dilation of the left renal pelvis and proximal left ureter without clear cause for obstruction. The adrenals are unremarkable. There is gas in the bladder lumen. A Foley catheter is identified within the prostatic urethra. The balloon is inflated within the region of the membranous urethra. Stomach/Bowel: No bowel obstruction or ileus. Distal colonic diverticulosis without diverticulitis. No bowel Chim thickening or inflammatory change. Vascular/Lymphatic: Aortic atherosclerosis. No enlarged abdominal or pelvic lymph nodes. Reproductive: The prostate is enlarged but stable. Other: No  free fluid or free gas.  No abdominal Donner hernia. Musculoskeletal: No acute or destructive bony lesions. Chronic L1 compression deformity unchanged. Reconstructed images demonstrate no additional findings. IMPRESSION: 1. Recurrent severe right-sided hydronephrosis and hydroureter related to an obstructing 3.3 cm right UVJ calculus. No significant change in the position of the right ureteral stent, extending from the right UPJ to the region of the right UVJ. 2. Malpositioning of the Foley catheter, with the balloon inflated within the prostatic/membranous urethra. Recommend removal and replacement. 3. Stable nonobstructing right renal calculi. 4. Horseshoe configuration of the kidneys, with marked atrophy of the right moiety. 5. Small right pleural effusion. 6. Gallbladder sludge. No evidence of cholelithiasis or cholecystitis. 7.  Aortic Atherosclerosis (ICD10-I70.0). Critical Value/emergent results were called by telephone at the time of interpretation on 01/02/2021 at 8:43 pm to provider [facility] , who verbally acknowledged these results. Electronically Signed   By: Sharlet Salina M.D.   On: 01/02/2021 20:46        Scheduled Meds:  (feeding supplement) PROSource Plus  30 mL Oral Daily   amLODipine  5 mg Oral Daily   Chlorhexidine Gluconate Cloth  6 each Topical Daily   feeding supplement  237 mL Oral BID BM   finasteride  5 mg Oral Daily   multivitamin with minerals  1 tablet Oral Daily   mupirocin ointment  1 application Nasal BID   tamsulosin  0.4 mg Oral Daily   Continuous Infusions:  ceFEPime (MAXIPIME) IV 2 g (01/04/21 1000)   dextrose 5 % and 0.45% NaCl 75 mL/hr at 01/04/21 0853     LOS: 2 days     Jacquelin Hawking, MD [facility] 01/04/2021, 4:10 PM  If 7PM-7AM, please contact night-coverage [email or web address]

## 2021-01-04 NOTE — Progress Notes (Signed)
Initial Nutrition Assessment  DOCUMENTATION CODES:   Severe malnutrition in context of chronic illness  INTERVENTION:  - will order Ensure Surgery BID, each supplement provides 330 kcal and 18 grams of protein - will order Magic Cup BID with meals, each supplement provides 290 kcal and 9 grams of protein. - will order 30 ml Prosource Plus once/day, each supplement provides 100 kcal and 15 grams protein.  - will order 1 tablet multivitamin with minerals/day. - recommend 250 mg ascorbic acid BID and 220 mg zinc sulfate/day to promote wound healing.   NUTRITION DIAGNOSIS:   Severe Malnutrition related to chronic illness as evidenced by severe fat depletion, severe muscle depletion.  GOAL:   Patient will meet greater than or equal to 90% of their needs  MONITOR:   PO intake, Supplement acceptance, Labs, Weight trends  REASON FOR ASSESSMENT:   Consult Assessment of nutrition requirement/status  ASSESSMENT:   87 y.o. male with medical history of HTN, chronic indwelling Foley catheter, nephrolithiasis, ED/PE on Xarelto, and BPH. He presented to the ED due to AMS. His daughter was present with him in the ED and provided most of the information at that time; reported that when she went to visit patient at SNF he was staring at the ceiling, reaching for things that were not there, and combative which is unusual for him. He was admitted 8/28-8/31 due to severe sepsis 2/2 CAUTI with hydronephrosis 2/2 calculus. He was again admitted 9/7-9/22 and was then discharged to SNF.  Patient laying in bed with tech at bedside, no family or visitors present, at the time of RD visit this AM. Tech reported that patient had breakfast but took 1 bite and chewed and chewed and chewed.   Patient has dentures which were not in at that time. Patient is mostly incomprehensible but does mention something about his dentures.   He does indicate that he does better with soft, easy-to-chew foods and beverages.  Patient is agreeable to oral nutrition supplements being added for him.  Weight on 9/26 was 126 lb and weight on 12/05/20 was documented as 147 lb. This indicates 21 lb weight loss (14% body weight) in the past 1 month.   He is POD #1 cystolithotripsy, R retrograde pyelogram, and removal of R ureteral stent   Labs reviewed; Cl: 117 mmol/l, BUN: 24 mg/dl, Ca: 8.2 mg/dl.  Medications reviewed; 10 mEq IV KCl x2 runs 9/27. IVF; D5-1/2 NS @ 75 ml/hr (306 kcal/24 hrs).     NUTRITION - FOCUSED PHYSICAL EXAM:  Flowsheet Row Most Recent Value  Orbital Region Severe depletion  Upper Arm Region Severe depletion  Thoracic and Lumbar Region Unable to assess  Buccal Region Severe depletion  Temple Region Severe depletion  Clavicle Bone Region Severe depletion  Clavicle and Acromion Bone Region Severe depletion  Scapular Bone Region Severe depletion  Dorsal Hand Moderate depletion  Patellar Region Moderate depletion  Anterior Thigh Region Severe depletion  Posterior Calf Region Moderate depletion  Edema (RD Assessment) None  Hair Reviewed  Eyes Reviewed  Mouth Reviewed  Skin Reviewed  Nails Reviewed       Diet Order:   Diet Order             Diet regular Room service appropriate? Yes; Fluid consistency: Thin  Diet effective now                   EDUCATION NEEDS:   No education needs have been identified at this time  Skin:  Skin  Assessment: Skin Integrity Issues: Skin Integrity Issues:: Stage III, Incisions Stage III: coccyx Incisions: penis (9/27)  Last BM:  PTA/unknown  Height:   Ht Readings from Last 1 Encounters:  01/02/21 5\' 9"  (1.753 m)    Weight:   Wt Readings from Last 1 Encounters:  01/02/21 57 kg     Estimated Nutritional Needs:  Kcal:  1800-2000 kcal Protein:  90-105 grams Fluid:  >/= 2 L/day     01/04/21, MS, RD, LDN, CNSC Inpatient Clinical Dietitian RD pager # available in AMION  After hours/weekend pager # available in  [facility]

## 2021-01-05 ENCOUNTER — Inpatient Hospital Stay (HOSPITAL_COMMUNITY): Payer: Medicare HMO

## 2021-01-05 DIAGNOSIS — E870 Hyperosmolality and hypernatremia: Secondary | ICD-10-CM | POA: Diagnosis not present

## 2021-01-05 DIAGNOSIS — N1330 Unspecified hydronephrosis: Secondary | ICD-10-CM | POA: Diagnosis not present

## 2021-01-05 DIAGNOSIS — I10 Essential (primary) hypertension: Secondary | ICD-10-CM | POA: Diagnosis not present

## 2021-01-05 DIAGNOSIS — G9341 Metabolic encephalopathy: Secondary | ICD-10-CM | POA: Diagnosis not present

## 2021-01-05 LAB — CBC
HCT: 26.5 % — ABNORMAL LOW (ref 39.0–52.0)
Hemoglobin: 8 g/dL — ABNORMAL LOW (ref 13.0–17.0)
MCH: 25.4 pg — ABNORMAL LOW (ref 26.0–34.0)
MCHC: 30.2 g/dL (ref 30.0–36.0)
MCV: 84.1 fL (ref 80.0–100.0)
Platelets: 203 10*3/uL (ref 150–400)
RBC: 3.15 MIL/uL — ABNORMAL LOW (ref 4.22–5.81)
RDW: 17.8 % — ABNORMAL HIGH (ref 11.5–15.5)
WBC: 9.5 10*3/uL (ref 4.0–10.5)
nRBC: 0 % (ref 0.0–0.2)

## 2021-01-05 LAB — BASIC METABOLIC PANEL
Anion gap: 4 — ABNORMAL LOW (ref 5–15)
BUN: 25 mg/dL — ABNORMAL HIGH (ref 8–23)
CO2: 25 mmol/L (ref 22–32)
Calcium: 8.2 mg/dL — ABNORMAL LOW (ref 8.9–10.3)
Chloride: 113 mmol/L — ABNORMAL HIGH (ref 98–111)
Creatinine, Ser: 0.92 mg/dL (ref 0.61–1.24)
GFR, Estimated: >60 mL/min (ref >60)
Glucose, Bld: 132 mg/dL — ABNORMAL HIGH (ref 70–99)
Potassium: 3.9 mmol/L (ref 3.5–5.1)
Sodium: 142 mmol/L (ref 135–145)

## 2021-01-05 LAB — AMMONIA: Ammonia: 24 umol/L (ref 9–35)

## 2021-01-05 LAB — TSH: TSH: 2.392 u[IU]/mL (ref 0.350–4.500)

## 2021-01-05 MED ORDER — COVID-19MRNA BIVAL VACC PFIZER 30 MCG/0.3ML IM SUSP
0.3000 mL | Freq: Once | INTRAMUSCULAR | Status: AC
  Administered 2021-01-09: 0.3 mL via INTRAMUSCULAR
  Filled 2021-01-05 (×2): qty 0.3

## 2021-01-05 NOTE — TOC Progression Note (Signed)
Transition of Care [facility]) - Progression Note    Patient Details  Name: Corey Cummings MRN: [id number] Date of Birth: 1930-11-24  Transition of [facility]) CM/SW Contact  Friend Dorfman, Olegario Messier, RN Phone Number: 01/05/2021, 1:42 PM  Clinical Narrative: [employer] rep Rosey Bath has initiated auth-await outcome.      Expected Discharge Plan: Skilled Nursing Facility Barriers to Discharge: Insurance Authorization  Expected Discharge Plan and Services Expected Discharge Plan: Skilled Nursing Facility   Discharge Planning Services: CM Consult Post Acute Care Choice: Skilled Nursing Facility Living arrangements for the past 2 months: Skilled Nursing Facility                                       Social Determinants of Health (SDOH) Interventions    Readmission Risk Interventions Readmission Risk Prevention Plan 01/05/2021  Transportation Screening Complete  Medication Review [employer]) Complete  PCP or Specialist appointment within 3-5 days of discharge Complete  HRI or Home Care Consult Complete  SW Recovery Care/Counseling Consult Complete  Skilled Nursing Facility Complete  Some recent data might be hidden

## 2021-01-05 NOTE — Progress Notes (Signed)
Patient's daughter [name] called and requested that patient get the COIVD boosters shot while he is here, Dr. Caleb Popp notified and order placed.

## 2021-01-05 NOTE — [place] FL2 (Signed)
[place] MEDICAID FL2 LEVEL OF CARE SCREENING TOOL     IDENTIFICATION  Patient Name: Corey Cummings Birthdate: 12-22-1930 Sex: male Admission Date (Current Location): 01/02/2021  [facility] and [place] Number:  Producer, television/film/video and Address:  [facility],  [address] [place], [place] [address]      Provider Number: [id number]  Attending Physician Name and Address:  Narda Bonds, MD  Relative Name and Phone Number:  Minerva Areola (daughter) - [phone]    Current Level of Care: Hospital Recommended Level of Care: Skilled Nursing Facility Prior Approval Number:    Date Approved/Denied:   PASRR Number: [id number]  Discharge Plan: SNF    Current Diagnoses: Patient Active Problem List   Diagnosis Date Noted   Hydronephrosis of right kidney 01/02/2021   Complicated UTI (urinary tract infection) 01/02/2021   Hypernatremia 01/02/2021   Essential hypertension 01/02/2021   Protein-calorie malnutrition, severe 12/29/2020   Pressure injury of skin 12/24/2020   Acute metabolic encephalopathy 12/14/2020   Hypokalemia 12/14/2020   History of DVT (deep vein thrombosis) 12/14/2020   History of pulmonary embolus (PE) 12/14/2020   Severe sepsis (HCC) 12/04/2020   Lactic acidosis 12/04/2020   Acute cystitis without hematuria 12/04/2020   Foley catheter problem (HCC) 12/04/2020   Unilateral recurrent inguinal hernia without obstruction or gangrene 12/04/2020   Acute pyelonephritis 12/04/2020   BPH (benign prostatic hyperplasia) 12/04/2020   Chronic indwelling Foley catheter 12/04/2020   Hypertensive urgency 12/04/2020   CKD (chronic kidney disease), stage III (HCC) 12/04/2020   Acute pulmonary embolism (HCC) 02/24/2020   Ureteral obstruction, right 02/24/2020   Urinary tract infection associated with catheterization of urinary tract, initial encounter (HCC) 02/24/2020   Renal insufficiency 02/24/2020   Thoracic aortic aneurysm without rupture (HCC) 02/24/2020    Bronchiectasis (HCC) 02/24/2020   Pulmonary embolism (HCC) 02/24/2020   Hydronephrosis with urinary obstruction due to ureteral calculus     Orientation RESPIRATION BLADDER Height & Weight     Self, Situation  Normal Indwelling catheter Weight: 57 kg Height:  5\' 9"  (175.3 cm)  BEHAVIORAL SYMPTOMS/MOOD NEUROLOGICAL BOWEL NUTRITION STATUS      Continent Diet (Regular)  AMBULATORY STATUS COMMUNICATION OF NEEDS Skin   Limited Assist Verbally Normal                       Personal Care Assistance Level of Assistance  Bathing, Feeding, Dressing Bathing Assistance: Limited assistance Feeding assistance: Limited assistance Dressing Assistance: Limited assistance     Functional Limitations Info  Sight, Hearing, Speech Sight Info: Impaired (eyeglasses) Hearing Info: Adequate Speech Info: Adequate    SPECIAL CARE FACTORS FREQUENCY  PT (By licensed PT), OT (By licensed OT)     PT Frequency:  (5x week) OT Frequency:  (5x week)            Contractures Contractures Info: Not present    Additional Factors Info  Code Status, Allergies Code Status Info:  (Full) Allergies Info:  (NKA)           Current Medications (01/05/2021):  This is the current hospital active medication list Current Facility-Administered Medications  Medication Dose Route Frequency Provider Last Rate Last Admin   (feeding supplement) PROSource Plus liquid 30 mL  30 mL Oral Daily 01/07/2021, MD   30 mL at 01/04/21 1702   acetaminophen (TYLENOL) tablet 650 mg  650 mg Oral Q6H PRN Chotiner, 01/06/21, MD   650 mg at 01/04/21 2155   Or  acetaminophen (TYLENOL) suppository 650 mg  650 mg Rectal Q6H PRN Chotiner, Claudean Severance, MD       amLODipine (NORVASC) tablet 5 mg  5 mg Oral Daily Chotiner, Claudean Severance, MD   5 mg at 01/05/21 5277   ascorbic acid (VITAMIN C) tablet 250 mg  250 mg Oral Daily Narda Bonds, MD   250 mg at 01/05/21 8242   Chlorhexidine Gluconate Cloth 2 % PADS 6 each  6 each Topical  Daily Chotiner, Claudean Severance, MD   6 each at 01/05/21 0959   dextrose 5 %-0.45 % sodium chloride infusion   Intravenous Continuous Burnadette Pop, MD 75 mL/hr at 01/05/21 0605 New Bag at 01/05/21 0605   feeding supplement (ENSURE ENLIVE / ENSURE PLUS) liquid 237 mL  237 mL Oral BID BM Narda Bonds, MD   237 mL at 01/05/21 0959   finasteride (PROSCAR) tablet 5 mg  5 mg Oral Daily Burnadette Pop, MD   5 mg at 01/05/21 0948   multivitamin with minerals tablet 1 tablet  1 tablet Oral Daily Narda Bonds, MD   1 tablet at 01/05/21 0947   mupirocin ointment (BACTROBAN) 2 % 1 application  1 application Nasal BID Narda Bonds, MD   1 application at 01/05/21 0950   ondansetron (ZOFRAN) tablet 4 mg  4 mg Oral Q6H PRN Chotiner, Claudean Severance, MD       Or   ondansetron [facility]) injection 4 mg  4 mg Intravenous Q6H PRN Chotiner, Claudean Severance, MD   4 mg at 01/03/21 1502   tamsulosin (FLOMAX) capsule 0.4 mg  0.4 mg Oral Daily Chotiner, Claudean Severance, MD   0.4 mg at 01/05/21 3536   zinc sulfate capsule 220 mg  220 mg Oral Daily Narda Bonds, MD   220 mg at 01/05/21 1443     Discharge Medications: Please see discharge summary for a list of discharge medications.  Relevant Imaging Results:  Relevant Lab Results:   Additional Information SS#[id number] [phone]  [employer] x2, no Art therapist, [place], Charity fundraiser

## 2021-01-05 NOTE — TOC Progression Note (Addendum)
Transition of Care [facility]) - Progression Note    Patient Details  Name: Corey Cummings MRN: [id number] Date of Birth: [birth year]  Transition of Care [facility]) CM/SW Contact  Lupe Handley, Olegario Messier, RN Phone Number: 01/05/2021, 11:59 AM  Clinical Narrative: Sherron Monday to patient's dtr Debbie d/c plan return back to [facility] SN[facility]-rep Rosey Bath aware-will start auth once OT has evaulated.Per dtr wants Booster to be [employer] if available.  1:24p-OT eval placed-[facility] rep Rosey Bath aware to start auth.    Expected Discharge Plan: Skilled Nursing Facility Barriers to Discharge: Continued Medical Work up  Expected Discharge Plan and Services Expected Discharge Plan: Skilled Nursing Facility   Discharge Planning Services: CM Consult Post Acute Care Choice: Skilled Nursing Facility Living arrangements for the past 2 months: Skilled Nursing Facility                                       Social Determinants of Health (SDOH) Interventions    Readmission Risk Interventions No flowsheet data found.

## 2021-01-05 NOTE — Evaluation (Signed)
Occupational Therapy Evaluation Patient Details Name: Corey Cummings MRN: [id number] DOB: [birth year] Today's Date: 01/05/2021   History of Present Illness 87 y.o. male admitted from SNF for confusion, elevated sodium level. Pt s/p 1. Bladder stone s/p cystolitholitripsy 01/03/2021  2. Chronic right hydro with minimal parenchyma. S/p R RPG and R stent removal 01/03/2021 as no obstruction identified per urology. Pt with recent admission on 12/14/2020 with AMS at [facility] and also hospitalized 8/28-8/31 with sepsis 2/2 UTI. PMH includes HTN, chronic indwelling Foley catheter, nephrolithiasis, ED/PE on Xarelto, and BPH.   Clinical Impression   Patient from SNF rehab. Presenting with generalized weakness, impaired balance and activity tolerance needing mod A for bed mobility, difficulty sequencing scooting hips forward needing max A in order for feet to touch the floor. Patient heavy mod A with bed height elevated to power up to standing. Upon standing patient with posterior lean needing assist to correct balance. Very unsteady with taking few steps to recliner with walker needing mod A to prevent fall  and cues to reach back to assist with eccentric control into chair. Would recommend continued SNF rehab at D/C as patient would be unable to safely care for himself or perform functional transfers, acute OT to follow.      Recommendations for follow up therapy are one component of a multi-disciplinary discharge planning process, led by the attending physician.  Recommendations may be updated based on patient status, additional functional criteria and insurance authorization.   Follow Up Recommendations  SNF;Supervision/Assistance - 24 hour    Equipment Recommendations  None recommended by OT       Precautions / Restrictions Precautions Precautions: Fall Precaution Comments: R sided lean in sitting and standing (also noted from last admission) Restrictions Weight Bearing Restrictions: No      Mobility Bed  Mobility Overal bed mobility: Needs Assistance Bed Mobility: Rolling;Sidelying to Sit Rolling: Supervision Sidelying to sit: Mod assist;HOB elevated       General bed mobility comments: mod A to upright trunk from sidelying    Transfers Overall transfer level: Needs assistance Equipment used: Rolling walker (2 wheeled) Transfers: Sit to/from [employer] Sit to Stand: Mod assist;From elevated surface Stand pivot transfers: Mod assist       General transfer comment: Cues to push from bed vs pulling on walker. Heavy mod A to power up to standing from elevated bed height. Unsteady with posterior lean, high fall risk needing mod A for balance and eccentric control into chair    Balance Overall balance assessment: Needs assistance Sitting-balance support: Bilateral upper extremity supported;Feet supported Sitting balance-Leahy Scale: Poor     Standing balance support: Bilateral upper extremity supported Standing balance-Leahy Scale: Poor Standing balance comment: heavy mod A                           ADL either performed or assessed with clinical judgement   ADL Overall ADL's : Needs assistance/impaired Eating/Feeding: Set up;Sitting   Grooming: Set up;Sitting   Upper Body Bathing: Sitting;Moderate assistance   Lower Body Bathing: Maximal assistance;Sitting/lateral leans;Sit to/from stand   Upper Body Dressing : Sitting;Moderate assistance   Lower Body Dressing: Total assistance;Sitting/lateral leans;Sit to/from stand Lower Body Dressing Details (indicate cue type and reason): poor sitting and standing balance Toilet Transfer: Moderate assistance;Stand-pivot;Cueing for sequencing;Cueing for safety;RW Toilet Transfer Details (indicate cue type and reason): To recliner, bed height elevated and heavy mod A to extend at hips and power up to standing.  Posterior lean, high fall risk Toileting- Clothing Manipulation and Hygiene: Total  assistance;Sitting/lateral lean;Sit to/from stand       Functional mobility during ADLs: Moderate assistance;Rolling walker;Cueing for sequencing;Cueing for safety        Pertinent Vitals/Pain Pain Assessment: Faces Faces Pain Scale: No hurt     Hand Dominance Right   Extremity/Trunk Assessment Upper Extremity Assessment Upper Extremity Assessment: RUE deficits/detail;LUE deficits/detail;Generalized weakness RUE Deficits / Details: limited shoulder AROM Bilateral           Communication Communication Communication: Expressive difficulties (patient does not have dentures, mumbled speech)   Cognition Arousal/Alertness: Awake/alert Behavior During Therapy: WFL for tasks assessed/performed Overall Cognitive Status: No family/caregiver present to determine baseline cognitive functioning Area of Impairment: Following commands                       Following Commands: Follows one step commands with increased time       General Comments: patient cannot recall name of hospital but able to state he is in the hospital. oriented to month/year not date "the 10th"              Home Living Family/patient expects to be discharged to:: Skilled nursing facility                                        Prior Functioning/Environment Level of Independence: Needs assistance  Gait / Transfers Assistance Needed: previous to last admission, pt was independent; currently requiring increased assist to mobilize, able to ambulate short distance at SNF with PT              OT Problem List: Decreased strength;Impaired balance (sitting and/or standing);Decreased knowledge of use of DME or AE;Decreased cognition;Decreased safety awareness;Decreased activity tolerance      OT Treatment/Interventions: Self-care/ADL training;DME and/or AE instruction;Therapeutic activities;Cognitive remediation/compensation;Patient/family education;Balance training    OT  Goals(Current goals can be found in the care plan section) Acute Rehab OT Goals Patient Stated Goal: eat lunch OT Goal Formulation: With patient Time For Goal Achievement: 01/19/21 Potential to Achieve Goals: Fair  OT Frequency: Min 2X/week    AM-PAC OT "6 Clicks" Daily Activity     Outcome Measure Help from another person eating meals?: A Little Help from another person taking care of personal grooming?: A Little Help from another person toileting, which includes using toliet, bedpan, or urinal?: Total Help from another person bathing (including washing, rinsing, drying)?: A Lot Help from another person to put on and taking off regular upper body clothing?: A Lot Help from another person to put on and taking off regular lower body clothing?: Total 6 Click Score: 12   End of Session Equipment Utilized During Treatment: Gait belt;Rolling walker Nurse Communication: Mobility status  Activity Tolerance: Patient tolerated treatment well Patient left: in chair;with call bell/phone within reach;with chair alarm set  OT Visit Diagnosis: Unsteadiness on feet (R26.81);Other abnormalities of gait and mobility (R26.89);Muscle weakness (generalized) (M62.81);Other symptoms and signs involving cognitive function                Time: 1251-1305 OT Time Calculation (min): 14 min Charges:  OT General Charges $OT Visit: 1 Visit OT Evaluation $OT Eval Low Complexity: 1 Low  Marlyce Huge OT OT pager: [phone]  Carmelia Roller 01/05/2021, 1:13 PM

## 2021-01-05 NOTE — Progress Notes (Signed)
PROGRESS NOTE    Corey Cummings  [id number]:[id number] DOB: 01-12-1931 DOA: 01/02/2021 PCP: [name], MD   Brief Narrative: Corey Cummings is a 87 y.o. male with a history of hypertension, chronic indwelling Foley catheter, nephrolithiasis, history of PE on Xarelto, BPH.  Patient presented secondary to confusion and was found to have evidence of persistent right-sided urolithiasis and ureteral obstruction with associated right-sided hydronephrosis.  Urology was consulted and performed cystoscopy lithotripsy with removal of right ureteral stent and also replacement of Foley catheter.   Assessment & Plan:   Active Problems:   Ureteral obstruction, right   CKD (chronic kidney disease), stage III (HCC)   Acute metabolic encephalopathy   Hydronephrosis of right kidney   Complicated UTI (urinary tract infection)   Hypernatremia   Essential hypertension   Right sided urolithiasis Ureteral obstruction Right sided hydronephrosis Urology consulted and performed cystolithotripsy and removal of right ureteral stent on 9/27.  Hypernatremia Secondary to poor oral intake. Resolved with IV fluids. Stable. -Discotninue IV fluids  Possible UTI Urinalysis suggests possible infection however urine culture is negative. Patient was started empirically on Cefepime which is discontinued.  Chronic foley catheter Exchanged 9/28. Continue.  Hypokalemia Hypomagnesemia Repleted.  CKD stage IIIa Baseline of 1.2-1.4. Stable.  Acute metabolic encephalopathy Likely related to anesthesia and/or Zyprexa. Confusion prior to admission of unknown etiology but could also be related to Zyprexa versus hypernatremia. Urine culture negative. Patient was previously planned for an MRI which could not be completed at last hospitalization. Zyprexa discontinued. Cognition improved. -MRI brain; recommend outpatient neurology/neuropsychology evaluation  Primary hypertension -Continue amlodipine  History of PE On  Xarelto as an outpatient which was held secondary to surgery -Restart Xarelto tomorrow  Normocytic anemia Trending down. Possible related to IV fluids and dilution. No associated blood loss noted. -CBC in AM  Severe malnutrition Dietitian recommendations: - will order Ensure Surgery BID, each supplement provides 330 kcal and 18 grams of protein - will order Magic Cup BID with meals, each supplement provides 290 kcal and 9 grams of protein. - will order 30 ml Prosource Plus once/day, each supplement provides 100 kcal and 15 grams protein.  - will order 1 tablet multivitamin with minerals/day. - recommend 250 mg ascorbic acid BID and 220 mg zinc sulfate/day to promote wound healing.   DVT prophylaxis: SCDs Code Status:   Code Status: DNR Family Communication: None at bedside Disposition Plan: Discharge to SNF. Medically stable for discharge as of 9/29.   Consultants:  Urology  Procedures:  CYSTOLITHOTRIPSY/RIGHT RETROGRADE PYELOGRAM/REMOVAL OF RIGHT URETERAL STENT (01/03/2021)  Antimicrobials: Cefepime    Subjective: No issues today.  Objective: Vitals:   01/04/21 2100 01/05/21 0500 01/05/21 0948 01/05/21 1254  BP: 114/81 111/86 116/66 119/72  Pulse: 72 74  79  Resp: 17 16  18   Temp: 98 F (36.7 C) 97.9 F (36.6 C)  97.6 F (36.4 C)  TempSrc: Oral Oral  Oral  SpO2: 95% 98%  97%  Weight:      Height:        Intake/Output Summary (Last 24 hours) at 01/05/2021 1541 Last data filed at 01/05/2021 1300 Gross per 24 hour  Intake 120 ml  Output 1900 ml  Net -1780 ml    Filed Weights   01/02/21 1757  Weight: 57 kg    Examination:  General exam: Appears calm and comfortable Respiratory system: Clear to auscultation. Respiratory effort normal. Cardiovascular system: S1 & S2 heard, RRR. No murmurs, rubs, gallops or clicks. Gastrointestinal system: Abdomen is  nondistended, soft and nontender. No organomegaly or masses felt. Normal bowel sounds heard. Central  nervous system: Alert and oriented. No focal neurological deficits. Musculoskeletal: No calf tenderness Skin: No cyanosis. No rashes Psychiatry: Judgement and insight appear normal. Mood & affect appropriate.     Data Reviewed: I have personally reviewed following labs and imaging studies  CBC Lab Results  Component Value Date   WBC 9.5 01/05/2021   RBC 3.15 (L) 01/05/2021   HGB 8.0 (L) 01/05/2021   HCT 26.5 (L) 01/05/2021   MCV 84.1 01/05/2021   MCH 25.4 (L) 01/05/2021   PLT 203 01/05/2021   MCHC 30.2 01/05/2021   RDW 17.8 (H) 01/05/2021   LYMPHSABS 0.7 01/04/2021   MONOABS 0.2 01/04/2021   EOSABS 0.0 01/04/2021   BASOSABS 0.0 01/04/2021     Last metabolic panel Lab Results  Component Value Date   NA 142 01/05/2021   K 3.9 01/05/2021   CL 113 (H) 01/05/2021   CO2 25 01/05/2021   BUN 25 (H) 01/05/2021   CREATININE 0.92 01/05/2021   GLUCOSE 132 (H) 01/05/2021   GFRNONAA >60 01/05/2021   CALCIUM 8.2 (L) 01/05/2021   PROT 6.2 (L) 01/02/2021   ALBUMIN 2.2 (L) 01/02/2021   BILITOT 0.5 01/02/2021   ALKPHOS 48 01/02/2021   AST 15 01/02/2021   ALT 11 01/02/2021   ANIONGAP 4 (L) 01/05/2021    CBG (last 3)  Recent Labs    01/03/21 1402 01/03/21 1545  GLUCAP 106* 97      GFR: Estimated Creatinine Clearance: 44.7 mL/min (by C-G formula based on SCr of 0.92 mg/dL).  Coagulation Profile: No results for input(s): INR, PROTIME in the last 168 hours.  Recent Results (from the past 240 hour(s))  SARS CORONAVIRUS 2 (TAT 6-24 HRS) Nasopharyngeal Nasopharyngeal Swab     Status: None   Collection Time: 12/27/20  4:59 PM   Specimen: Nasopharyngeal Swab  Result Value Ref Range Status   SARS Coronavirus 2 NEGATIVE NEGATIVE Final    Comment: (NOTE) SARS-CoV-2 target nucleic acids are NOT DETECTED.  The SARS-CoV-2 RNA is generally detectable in upper and lower respiratory specimens during the acute phase of infection. Negative results do not preclude SARS-CoV-2  infection, do not rule out co-infections with other pathogens, and should not be used as the sole basis for treatment or other patient management decisions. Negative results must be combined with clinical observations, patient history, and epidemiological information. The expected result is Negative.  Fact Sheet for Patients: HairSlick.no  Fact Sheet for Healthcare Providers: quierodirigir.com  This test is not yet approved or cleared by the [place] FDA and  has been authorized for detection and/or diagnosis of SARS-CoV-2 by FDA under an Emergency Use Authorization (EUA). This EUA will remain  in effect (meaning this test can be used) for the duration of the COVID-19 declaration under Se ction 564(b)(1) of the Act, 21 U.S.C. section 360bbb-3(b)(1), unless the authorization is terminated or revoked sooner.  Performed at [facility], [address] [place], [place] [address]   Urine Culture     Status: None   Collection Time: 01/02/21  6:11 PM   Specimen: Urine, Catheterized  Result Value Ref Range Status   Specimen Description   Final    URINE, CATHETERIZED Performed at [facility], [address], [place], [place] [address]    Special Requests   Final    NONE Performed at [facility], [address] [place], [place] [address]  Culture   Final    NO GROWTH Performed at [facility], [address], [place], [place] [address]    Report Status 01/03/2021 FINAL  Final  SARS Coronavirus 2 by RT PCR (hospital order, performed in [facility] hospital lab) Nasopharyngeal Nasopharyngeal Swab     Status: None   Collection Time: 01/03/21  7:15 AM   Specimen: Nasopharyngeal Swab  Result Value Ref Range Status   SARS Coronavirus 2 NEGATIVE NEGATIVE Final    Comment: (NOTE) SARS-CoV-2 target nucleic acids are NOT DETECTED.  The SARS-CoV-2 RNA is generally detectable  in upper and lower respiratory specimens during the acute phase of infection. The lowest concentration of SARS-CoV-2 viral copies this assay can detect is 250 copies / mL. A negative result does not preclude SARS-CoV-2 infection and should not be used as the sole basis for treatment or other patient management decisions.  A negative result may occur with improper specimen collection / handling, submission of specimen other than nasopharyngeal swab, presence of viral mutation(s) within the areas targeted by this assay, and inadequate number of viral copies (<250 copies / mL). A negative result must be combined with clinical observations, patient history, and epidemiological information.  Fact Sheet for Patients:   BoilerBrush.com.cy  Fact Sheet for Healthcare Providers: https://pope.com/  This test is not yet approved or  cleared by the [place] FDA and has been authorized for detection and/or diagnosis of SARS-CoV-2 by FDA under an Emergency Use Authorization (EUA).  This EUA will remain in effect (meaning this test can be used) for the duration of the COVID-19 declaration under Section 564(b)(1) of the Act, 21 U.S.C. section 360bbb-3(b)(1), unless the authorization is terminated or revoked sooner.  Performed at [facility], [address] [place], [place] [address]   Surgical PCR screen     Status: None   Collection Time: 01/04/21 10:21 AM   Specimen: Nasal Mucosa; Nasal Swab  Result Value Ref Range Status   MRSA, PCR NEGATIVE NEGATIVE Final   Staphylococcus aureus NEGATIVE NEGATIVE Final    Comment: (NOTE) The Xpert SA Assay (FDA approved for NASAL specimens in patients 44 years of age and older), is one component of a comprehensive surveillance program. It is not intended to diagnose infection nor to guide or monitor treatment. Performed at [facility], [address], [place], [place] [address]          Radiology Studies: MR BRAIN WO CONTRAST  Result Date: 01/05/2021 CLINICAL DATA:  Mental status change, unknown cause EXAM: MRI HEAD WITHOUT CONTRAST TECHNIQUE: Multiplanar, multiecho pulse sequences of the brain and surrounding structures were obtained without intravenous contrast. COMPARISON:  CT head 12/14/2020. FINDINGS: Motion limited study.  Within this limitation: Brain: No acute infarction, acute hemorrhage, hydrocephalus, extra-axial collection or mass lesion. Moderate atrophy with ex vacuo ventricular dilation. Mild for age scattered T2 hyperintensities within the white matter, nonspecific but compatible with chronic microvascular ischemic disease. Small focus of susceptibility artifact in the left cerebellum without associated edema, likely prior microhemorrhage. Vascular: Non dominant right vertebral artery. Major arterial flow voids are maintained at the skull base. Skull and upper cervical spine: Normal marrow signal. Degenerative change in the upper cervical spine with likely at least moderate canal stenosis at C2-C3. Sinuses/Orbits: Clear sinuses.  Unremarkable orbits. Other: No mastoid effusions. IMPRESSION: 1. No evidence of acute intracranial abnormality on this motion limited study. 2. Moderate atrophy and mild chronic microvascular ischemic disease. 3. Degenerative change in the  upper cervical spine with likely at least moderate canal stenosis at C2-C3. An MRI of the cervical spine could further characterize if clinically indicated. Electronically Signed   By: Feliberto Harts M.D.   On: 01/05/2021 14:14        Scheduled Meds:  (feeding supplement) PROSource Plus  30 mL Oral Daily   amLODipine  5 mg Oral Daily   vitamin C  250 mg Oral Daily   Chlorhexidine Gluconate Cloth  6 each Topical Daily   COVID-19 mRNA bivalent vaccine (Pfizer)  0.3 mL Intramuscular Once   feeding supplement  237 mL Oral BID BM   finasteride  5 mg Oral Daily    multivitamin with minerals  1 tablet Oral Daily   mupirocin ointment  1 application Nasal BID   tamsulosin  0.4 mg Oral Daily   zinc sulfate  220 mg Oral Daily   Continuous Infusions:     LOS: 3 days     Jacquelin Hawking, MD [facility] 01/05/2021, 3:41 PM  If 7PM-7AM, please contact night-coverage [email or web address]

## 2021-01-06 ENCOUNTER — Inpatient Hospital Stay (HOSPITAL_COMMUNITY): Payer: Medicare HMO

## 2021-01-06 DIAGNOSIS — I10 Essential (primary) hypertension: Secondary | ICD-10-CM | POA: Diagnosis not present

## 2021-01-06 DIAGNOSIS — E870 Hyperosmolality and hypernatremia: Secondary | ICD-10-CM | POA: Diagnosis not present

## 2021-01-06 DIAGNOSIS — N1330 Unspecified hydronephrosis: Secondary | ICD-10-CM | POA: Diagnosis not present

## 2021-01-06 DIAGNOSIS — G9341 Metabolic encephalopathy: Secondary | ICD-10-CM | POA: Diagnosis not present

## 2021-01-06 LAB — CBC
HCT: 31.4 % — ABNORMAL LOW (ref 39.0–52.0)
Hemoglobin: 9.6 g/dL — ABNORMAL LOW (ref 13.0–17.0)
MCH: 25.9 pg — ABNORMAL LOW (ref 26.0–34.0)
MCHC: 30.6 g/dL (ref 30.0–36.0)
MCV: 84.6 fL (ref 80.0–100.0)
Platelets: 161 10*3/uL (ref 150–400)
RBC: 3.71 MIL/uL — ABNORMAL LOW (ref 4.22–5.81)
RDW: 18.1 % — ABNORMAL HIGH (ref 11.5–15.5)
WBC: 9 10*3/uL (ref 4.0–10.5)
nRBC: 0 % (ref 0.0–0.2)

## 2021-01-06 MED ORDER — APIXABAN 5 MG PO TABS
5.0000 mg | ORAL_TABLET | Freq: Two times a day (BID) | ORAL | Status: DC
  Administered 2021-01-06 – 2021-01-09 (×7): 5 mg via ORAL
  Filled 2021-01-06 (×7): qty 1

## 2021-01-06 NOTE — Progress Notes (Signed)
PROGRESS NOTE    Corey Cummings  FFM:[id number] DOB: [birth year] DOA: 01/02/2021 PCP: [name], MD   Brief Narrative: Corey Cummings is a 87 y.o. male with a history of hypertension, chronic indwelling Foley catheter, nephrolithiasis, history of PE on Eliquis, BPH.  Patient presented secondary to confusion and was found to have evidence of persistent right-sided urolithiasis and ureteral obstruction with associated right-sided hydronephrosis.  Urology was consulted and performed cystoscopy lithotripsy with removal of right ureteral stent and also replacement of Foley catheter.   Assessment & Plan:   Active Problems:   Ureteral obstruction, right   CKD (chronic kidney disease), stage III (HCC)   Acute metabolic encephalopathy   Hydronephrosis of right kidney   Complicated UTI (urinary tract infection)   Hypernatremia   Essential hypertension   Right sided urolithiasis Ureteral obstruction Right sided hydronephrosis Urology consulted and performed cystolithotripsy and removal of right ureteral stent on 9/27. Outpatient urology follow-up.  Hypernatremia Secondary to poor oral intake. Resolved with IV fluids. Stable. -Discotninue IV fluids  Chronic foley catheter Exchanged 9/28. Continue.  Hypokalemia Hypomagnesemia Repleted.  CKD stage IIIa Baseline of 1.2-1.4. Stable.  Acute metabolic encephalopathy Likely related to anesthesia and/or Zyprexa. Confusion prior to admission of unknown etiology but could also be related to Zyprexa versus hypernatremia. Urine culture negative. Patient was previously planned for an MRI which could not be completed at last hospitalization. Zyprexa discontinued. Cognition improved. MRI without acute abnormality.  Moderate cervical stenosis No obvious symptoms. Outpatient follow-up with neurosurgery.  Primary hypertension -Continue amlodipine  History of PE On Eliquis as an outpatient which was held secondary to surgery -Restart Eliquis  today  Normocytic anemia Trended down. Possible related to IV fluids and dilution. No associated blood loss noted. Stable.   Severe malnutrition Dietitian recommendations: - will order Ensure Surgery BID, each supplement provides 330 kcal and 18 grams of protein - will order Magic Cup BID with meals, each supplement provides 290 kcal and 9 grams of protein. - will order 30 ml Prosource Plus once/day, each supplement provides 100 kcal and 15 grams protein.  - will order 1 tablet multivitamin with minerals/day. - recommend 250 mg ascorbic acid BID and 220 mg zinc sulfate/day to promote wound healing.  Possible UTI, ruled out. Urinalysis suggests possible infection however urine culture is negative. Patient was started empirically on Cefepime which is discontinued.   DVT prophylaxis: SCDs Code Status:   Code Status: DNR Family Communication: None at bedside. Daughter on telephone (12 minutes) Disposition Plan: Discharge to SNF. Medically stable for discharge as of 9/29.   Consultants:  Urology  Procedures:  CYSTOLITHOTRIPSY/RIGHT RETROGRADE PYELOGRAM/REMOVAL OF RIGHT URETERAL STENT (01/03/2021)  Antimicrobials: Cefepime    Subjective: No issues today.  Objective: Vitals:   01/05/21 0948 01/05/21 1254 01/05/21 2018 01/06/21 0433  BP: 116/66 119/72 116/60 117/79  Pulse:  79 80 84  Resp:  18 20 18   Temp:  97.6 F (36.4 C) 98.3 F (36.8 C) 98.9 F (37.2 C)  TempSrc:  Oral Oral Oral  SpO2:  97% 97% 98%  Weight:      Height:        Intake/Output Summary (Last 24 hours) at 01/06/2021 0917 Last data filed at 01/06/2021 0600 Gross per 24 hour  Intake 1558.47 ml  Output 900 ml  Net 658.47 ml    Filed Weights   01/02/21 1757  Weight: 57 kg    Examination:  General exam: Appears calm and comfortable Respiratory system: RUL rales. Respiratory effort normal.  Cardiovascular system: S1 & S2 heard, RRR. No murmurs, rubs, gallops or clicks. Gastrointestinal system:  Abdomen is nondistended, soft and nontender. No organomegaly or masses felt. Normal bowel sounds heard. Central nervous system: Alert and oriented. No focal neurological deficits. Musculoskeletal: No edema. No calf tenderness Skin: No cyanosis. No rashes    Data Reviewed: I have personally reviewed following labs and imaging studies  CBC Lab Results  Component Value Date   WBC 9.0 01/06/2021   RBC 3.71 (L) 01/06/2021   HGB 9.6 (L) 01/06/2021   HCT 31.4 (L) 01/06/2021   MCV 84.6 01/06/2021   MCH 25.9 (L) 01/06/2021   PLT 161 01/06/2021   MCHC 30.6 01/06/2021   RDW 18.1 (H) 01/06/2021   LYMPHSABS 0.7 01/04/2021   MONOABS 0.2 01/04/2021   EOSABS 0.0 01/04/2021   BASOSABS 0.0 01/04/2021     Last metabolic panel Lab Results  Component Value Date   NA 142 01/05/2021   K 3.9 01/05/2021   CL 113 (H) 01/05/2021   CO2 25 01/05/2021   BUN 25 (H) 01/05/2021   CREATININE 0.92 01/05/2021   GLUCOSE 132 (H) 01/05/2021   GFRNONAA >60 01/05/2021   CALCIUM 8.2 (L) 01/05/2021   PROT 6.2 (L) 01/02/2021   ALBUMIN 2.2 (L) 01/02/2021   BILITOT 0.5 01/02/2021   ALKPHOS 48 01/02/2021   AST 15 01/02/2021   ALT 11 01/02/2021   ANIONGAP 4 (L) 01/05/2021    CBG (last 3)  Recent Labs    01/03/21 1402 01/03/21 1545  GLUCAP 106* 97      GFR: Estimated Creatinine Clearance: 44.7 mL/min (by C-G formula based on SCr of 0.92 mg/dL).  Coagulation Profile: No results for input(s): INR, PROTIME in the last 168 hours.  Recent Results (from the past 240 hour(s))  SARS CORONAVIRUS 2 (TAT 6-24 HRS) Nasopharyngeal Nasopharyngeal Swab     Status: None   Collection Time: 12/27/20  4:59 PM   Specimen: Nasopharyngeal Swab  Result Value Ref Range Status   SARS Coronavirus 2 NEGATIVE NEGATIVE Final    Comment: (NOTE) SARS-CoV-2 target nucleic acids are NOT DETECTED.  The SARS-CoV-2 RNA is generally detectable in upper and lower respiratory specimens during the acute phase of infection.  Negative results do not preclude SARS-CoV-2 infection, do not rule out co-infections with other pathogens, and should not be used as the sole basis for treatment or other patient management decisions. Negative results must be combined with clinical observations, patient history, and epidemiological information. The expected result is Negative.  Fact Sheet for Patients: HairSlick.no  Fact Sheet for Healthcare Providers: quierodirigir.com  This test is not yet approved or cleared by the [place] FDA and  has been authorized for detection and/or diagnosis of SARS-CoV-2 by FDA under an Emergency Use Authorization (EUA). This EUA will remain  in effect (meaning this test can be used) for the duration of the COVID-19 declaration under Se ction 564(b)(1) of the Act, 21 U.S.C. section 360bbb-3(b)(1), unless the authorization is terminated or revoked sooner.  Performed at [facility], [address] [place], [place] [address]   Urine Culture     Status: None   Collection Time: 01/02/21  6:11 PM   Specimen: Urine, Catheterized  Result Value Ref Range Status   Specimen Description   Final    URINE, CATHETERIZED Performed at [facility], [address] [place], [place] [address]    Special Requests   Final    NONE Performed at [facility], [address], [place], [place] [address]    Culture   Final    NO GROWTH Performed at [facility], [address] [place], [place] [address]    Report Status 01/03/2021 FINAL  Final  SARS Coronavirus 2 by RT PCR (hospital order, performed in [facility] hospital lab) Nasopharyngeal Nasopharyngeal Swab     Status: None   Collection Time: 01/03/21  7:15 AM   Specimen: Nasopharyngeal Swab  Result Value Ref Range Status   SARS Coronavirus 2 NEGATIVE NEGATIVE Final    Comment: (NOTE) SARS-CoV-2 target nucleic acids are NOT  DETECTED.  The SARS-CoV-2 RNA is generally detectable in upper and lower respiratory specimens during the acute phase of infection. The lowest concentration of SARS-CoV-2 viral copies this assay can detect is 250 copies / mL. A negative result does not preclude SARS-CoV-2 infection and should not be used as the sole basis for treatment or other patient management decisions.  A negative result may occur with improper specimen collection / handling, submission of specimen other than nasopharyngeal swab, presence of viral mutation(s) within the areas targeted by this assay, and inadequate number of viral copies (<250 copies / mL). A negative result must be combined with clinical observations, patient history, and epidemiological information.  Fact Sheet for Patients:   BoilerBrush.com.cy  Fact Sheet for Healthcare Providers: https://pope.com/  This test is not yet approved or  cleared by the [place] FDA and has been authorized for detection and/or diagnosis of SARS-CoV-2 by FDA under an Emergency Use Authorization (EUA).  This EUA will remain in effect (meaning this test can be used) for the duration of the COVID-19 declaration under Section 564(b)(1) of the Act, 21 U.S.C. section 360bbb-3(b)(1), unless the authorization is terminated or revoked sooner.  Performed at [facility], [address], [place], [place] [address]   Surgical PCR screen     Status: None   Collection Time: 01/04/21 10:21 AM   Specimen: Nasal Mucosa; Nasal Swab  Result Value Ref Range Status   MRSA, PCR NEGATIVE NEGATIVE Final   Staphylococcus aureus NEGATIVE NEGATIVE Final    Comment: (NOTE) The Xpert SA Assay (FDA approved for NASAL specimens in patients 60 years of age and older), is one component of a comprehensive surveillance program. It is not intended to diagnose infection nor to guide or monitor treatment. Performed at [facility], [address], [place], [place] [address]          Radiology Studies: MR BRAIN WO CONTRAST  Result Date: 01/05/2021 CLINICAL DATA:  Mental status change, unknown cause EXAM: MRI HEAD WITHOUT CONTRAST TECHNIQUE: Multiplanar, multiecho pulse sequences of the brain and surrounding structures were obtained without intravenous contrast. COMPARISON:  CT head 12/14/2020. FINDINGS: Motion limited study.  Within this limitation: Brain: No acute infarction, acute hemorrhage, hydrocephalus, extra-axial collection or mass lesion. Moderate atrophy with ex vacuo ventricular dilation. Mild for age scattered T2 hyperintensities within the white matter, nonspecific but compatible with chronic microvascular ischemic disease. Small focus of susceptibility artifact in the left cerebellum without associated edema, likely prior microhemorrhage. Vascular: Non dominant right vertebral artery. Major arterial flow voids are maintained at the skull base. Skull and upper cervical spine: Normal marrow signal. Degenerative change in the upper cervical spine with likely at least moderate canal stenosis at C2-C3. Sinuses/Orbits: Clear sinuses.  Unremarkable orbits. Other: No mastoid effusions. IMPRESSION: 1. No evidence of acute intracranial abnormality on this motion limited study. 2. Moderate atrophy  and mild chronic microvascular ischemic disease. 3. Degenerative change in the upper cervical spine with likely at least moderate canal stenosis at C2-C3. An MRI of the cervical spine could further characterize if clinically indicated. Electronically Signed   By: Feliberto Harts M.D.   On: 01/05/2021 14:14        Scheduled Meds:  (feeding supplement) PROSource Plus  30 mL Oral Daily   amLODipine  5 mg Oral Daily   vitamin C  250 mg Oral Daily   Chlorhexidine Gluconate Cloth  6 each Topical Daily   COVID-19 mRNA bivalent vaccine (Pfizer)  0.3 mL Intramuscular Once   feeding supplement  237 mL Oral  BID BM   finasteride  5 mg Oral Daily   multivitamin with minerals  1 tablet Oral Daily   mupirocin ointment  1 application Nasal BID   tamsulosin  0.4 mg Oral Daily   zinc sulfate  220 mg Oral Daily   Continuous Infusions:     LOS: 4 days     Jacquelin Hawking, MD [facility] 01/06/2021, 9:17 AM  If 7PM-7AM, please contact night-coverage [email or web address]

## 2021-01-06 NOTE — Progress Notes (Signed)
3 Days Post-Op Subjective: Denies pain. No nausea or emesis. Afebrile.  Objective: Vital signs in last 24 hours: Temp:  [97.6 F (36.4 C)-98.9 F (37.2 C)] 98.9 F (37.2 C) (09/30 0433) Pulse Rate:  [79-84] 79 (09/30 1116) Resp:  [14-20] 14 (09/30 1116) BP: (116-124)/(60-79) 124/77 (09/30 1116) SpO2:  [97 %-99 %] 99 % (09/30 1116)  Intake/Output from previous day: 09/29 0701 - 09/30 0700 In: 1558.5 [P.O.:840; I.V.:718.5] Out: 900 [Urine:900] Intake/Output this shift: Total I/O In: 120 [P.O.:120] Out: 200 [Urine:200]  Foley clear yellow  Physical Exam:  General: Alert and oriented CV: RRR Lungs: Clear Abdomen: Soft, ND, NT Ext: NT, No erythema  Lab Results: Recent Labs    01/04/21 0452 01/05/21 0433 01/06/21 0430  HGB 8.5* 8.0* 9.6*  HCT 28.4* 26.5* 31.4*   BMET Recent Labs    01/04/21 0452 01/05/21 0433  NA 144 142  K 4.1 3.9  CL 117* 113*  CO2 22 25  GLUCOSE 223* 132*  BUN 24* 25*  CREATININE 0.74 0.92  CALCIUM 8.2* 8.2*     Studies/Results: MR BRAIN WO CONTRAST  Result Date: 01/05/2021 CLINICAL DATA:  Mental status change, unknown cause EXAM: MRI HEAD WITHOUT CONTRAST TECHNIQUE: Multiplanar, multiecho pulse sequences of the brain and surrounding structures were obtained without intravenous contrast. COMPARISON:  CT head 12/14/2020. FINDINGS: Motion limited study.  Within this limitation: Brain: No acute infarction, acute hemorrhage, hydrocephalus, extra-axial collection or mass lesion. Moderate atrophy with ex vacuo ventricular dilation. Mild for age scattered T2 hyperintensities within the white matter, nonspecific but compatible with chronic microvascular ischemic disease. Small focus of susceptibility artifact in the left cerebellum without associated edema, likely prior microhemorrhage. Vascular: Non dominant right vertebral artery. Major arterial flow voids are maintained at the skull base. Skull and upper cervical spine: Normal marrow signal.  Degenerative change in the upper cervical spine with likely at least moderate canal stenosis at C2-C3. Sinuses/Orbits: Clear sinuses.  Unremarkable orbits. Other: No mastoid effusions. IMPRESSION: 1. No evidence of acute intracranial abnormality on this motion limited study. 2. Moderate atrophy and mild chronic microvascular ischemic disease. 3. Degenerative change in the upper cervical spine with likely at least moderate canal stenosis at C2-C3. An MRI of the cervical spine could further characterize if clinically indicated. Electronically Signed   By: Feliberto Harts M.D.   On: 01/05/2021 14:14    Assessment/Plan: Bladder stone s/p cystolitholitripsy 01/03/2021 Chronic right hydro with minimal parenchyma. S/p R RPG and R stent removal 01/03/2021 as no obstruction identified Malpositioned foley: Resolved. Foley last exchanged 01/03/2021  -Leave chronic indwelling foley to gravity -Creatinine appropriate at 0.92 -Will arrange followup outpatient -Following peripherally. Please call with questions.   LOS: 4 days   Matt R. Chani Ghanem MD 01/06/2021, 11:18 AM [facility]  Pager: [phone]

## 2021-01-06 NOTE — Care Management Important Message (Signed)
Medicare IM printed for Social Work at [facility] to be given to the patient 

## 2021-01-07 DIAGNOSIS — I10 Essential (primary) hypertension: Secondary | ICD-10-CM | POA: Diagnosis not present

## 2021-01-07 DIAGNOSIS — N1330 Unspecified hydronephrosis: Secondary | ICD-10-CM | POA: Diagnosis not present

## 2021-01-07 DIAGNOSIS — E870 Hyperosmolality and hypernatremia: Secondary | ICD-10-CM | POA: Diagnosis not present

## 2021-01-07 DIAGNOSIS — G9341 Metabolic encephalopathy: Secondary | ICD-10-CM | POA: Diagnosis not present

## 2021-01-07 LAB — BASIC METABOLIC PANEL
Anion gap: 5 (ref 5–15)
BUN: 28 mg/dL — ABNORMAL HIGH (ref 8–23)
CO2: 27 mmol/L (ref 22–32)
Calcium: 8.8 mg/dL — ABNORMAL LOW (ref 8.9–10.3)
Chloride: 111 mmol/L (ref 98–111)
Creatinine, Ser: 0.95 mg/dL (ref 0.61–1.24)
GFR, Estimated: >60 mL/min (ref >60)
Glucose, Bld: 105 mg/dL — ABNORMAL HIGH (ref 70–99)
Potassium: 4.3 mmol/L (ref 3.5–5.1)
Sodium: 143 mmol/L (ref 135–145)

## 2021-01-07 NOTE — Progress Notes (Signed)
Physical Therapy Treatment Patient Details Name: Corey Cummings MRN: [id number] DOB: 05-13-1930 Today's Date: 01/07/2021   History of Present Illness 87 y.o. male admitted from SNF for confusion, elevated sodium level. Pt s/p 1. Bladder stone s/p cystolitholitripsy 01/03/2021  2. Chronic right hydro with minimal parenchyma. S/p R RPG and R stent removal 01/03/2021 as no obstruction identified per urology. Pt with recent admission on 12/14/2020 with AMS at [facility] and also hospitalized 8/28-8/31 with sepsis 2/2 UTI. PMH includes HTN, chronic indwelling Foley catheter, nephrolithiasis, ED/PE on Xarelto, and BPH.    PT Comments    Pt was already up in recliner by nursing. Nursing stated it took 3 person assit to chair due to posterior lean an pt trying to progress feet an not upright.   Worked with bring weight forward in sitting position and multiple sit to stands to help bring weight more upright , with assisted ambulation in room as well. Patient very pleasant and cooperative, and weight shift improved but still 2 person assist at this time. Continue to recommend ST-SNF for rehab due to pt was independent at home prior to initial hospitalization in August.     Recommendations for follow up therapy are one component of a multi-disciplinary discharge planning process, led by the attending physician.  Recommendations may be updated based on patient status, additional functional criteria and insurance authorization.  Follow Up Recommendations  SNF;Supervision/Assistance - 24 hour     Equipment Recommendations  3in1 (PT);Wheelchair (measurements PT);Hospital bed    Recommendations for Other Services       Precautions / Restrictions Precautions Precautions: Fall     Mobility  Bed Mobility Overal bed mobility: Needs Assistance         Sit to supine: Min assist   General bed mobility comments: max cues and demonstration for pt to lie down and where to put legs, then he performed .And multi cues  for him to scoot his bottom to center of bed, however he was able to perform with tactile and demonstrative cuing.    Transfers Overall transfer level: Needs assistance Equipment used: Rolling walker (2 wheeled) Transfers: Sit to/from [employer] Sit to Stand: +2 physical assistance;Max assist         General transfer comment: performed sit to stand multiple times for hygiene after a bowel momvment while up in recliner that he was unawre of. Initally first sit to stand with great posterior lean with Max A x2 for posterior lean. Then worked on getting upright and forward weight shifitn in sitting , so 2nd sit to stand was improved howevr still wiht R and posterior lean. 3rd sit to stnd improved a little more, but still ModA x2.  Ambulation/Gait Ambulation/Gait assistance: Mod assist;+2 physical assistance Gait Distance (Feet): 10 Feet (twice) Assistive device: Rolling walker (2 wheeled) Gait Pattern/deviations: Decreased stride length     General Gait Details: pt intially withposterior lean but improved when steps and progressionw ith ambulation intiated however ran out of room in the room and once static standing the posterior lean became more prominent again.Then walked from recliner to bed with turning and pt was able to help and particpate , still with some slight posterior lean but continued to improve a little the more we moved.   Stairs             Wheelchair Mobility    Modified Rankin (Stroke Patients Only)       Balance Overall balance assessment: Needs assistance Sitting-balance support: Bilateral upper extremity  supported;Feet supported Sitting balance-Leahy Scale: Poor     Standing balance support: Bilateral upper extremity supported Standing balance-Leahy Scale: Poor Standing balance comment: heavy mod A                            Cognition Arousal/Alertness: Awake/alert Behavior During Therapy: WFL for tasks  assessed/performed Overall Cognitive Status: No family/caregiver present to determine baseline cognitive functioning                         Following Commands: Follows one step commands with increased time (with tactile and demonstrative cues)       General Comments: Pt was appropriate with comments and followed our commands during session today and thanked [place] appropriately as well, however unsure of his awareness to his location and why he is here.      Exercises      General Comments        Pertinent Vitals/Pain      Home Living                      Prior Function            PT Goals (current goals can now be found in the care plan section) Acute Rehab PT Goals Patient Stated Goal: eat lunch PT Goal Formulation: With patient/family Time For Goal Achievement: 01/19/21 Potential to Achieve Goals: Fair Progress towards PT goals: Progressing toward goals    Frequency    Min 2X/week      PT Plan Current plan remains appropriate    Co-evaluation              AM-PAC PT "6 Clicks" Mobility   Outcome Measure  Help needed turning from your back to your side while in a flat bed without using bedrails?: A Little Help needed moving from lying on your back to sitting on the side of a flat bed without using bedrails?: A Lot Help needed moving to and from a bed to a chair (including a wheelchair)?: Total Help needed standing up from a chair using your arms (e.g., wheelchair or bedside chair)?: Total Help needed to walk in hospital room?: Total Help needed climbing 3-5 steps with a railing? : Total 6 Click Score: 9    End of Session Equipment Utilized During Treatment: Gait belt Activity Tolerance: Patient tolerated treatment well Patient left: in bed;with call bell/phone within reach;with bed alarm set Nurse Communication: Mobility status (nursing needs to use a stedy for transfers) PT Visit Diagnosis: Unsteadiness on feet (R26.81);Muscle  weakness (generalized) (M62.81);Difficulty in walking, not elsewhere classified (R26.2)     Time: 1200-1230 PT Time Calculation (min) (ACUTE ONLY): 30 min  Charges:  $Gait Training: 8-22 mins $Therapeutic Activity: 8-22 mins                     Tobechukwu Emmick, PT, MPT Acute Rehabilitation Services Office: [phone] Pager: [phone] 01/07/2021    Marella Bile 01/07/2021, 1:15 PM

## 2021-01-07 NOTE — Progress Notes (Signed)
PROGRESS NOTE    Corey Cummings  BJY:[id number] DOB: 05-25-1930 DOA: 01/02/2021 PCP: [name], MD   Brief Narrative: Corey Cummings is a 87 y.o. male with a history of hypertension, chronic indwelling Foley catheter, nephrolithiasis, history of PE on Eliquis, BPH.  Patient presented secondary to confusion and was found to have evidence of persistent right-sided urolithiasis and ureteral obstruction with associated right-sided hydronephrosis.  Urology was consulted and performed cystoscopy lithotripsy with removal of right ureteral stent and also replacement of Foley catheter.   Assessment & Plan:   Active Problems:   Ureteral obstruction, right   CKD (chronic kidney disease), stage III (HCC)   Acute metabolic encephalopathy   Hydronephrosis of right kidney   Complicated UTI (urinary tract infection)   Hypernatremia   Essential hypertension   Right sided urolithiasis Ureteral obstruction Right sided hydronephrosis Urology consulted and performed cystolithotripsy and removal of right ureteral stent on 9/27. Outpatient urology follow-up.  Hypernatremia Secondary to poor oral intake. Resolved with IV fluids. Stable. Some confusion today after stopping IV fluids -Repeat BMP; if sodium is elevated, will resume IV fluids  Chronic foley catheter Exchanged 9/28. Continue.  Hypokalemia Hypomagnesemia Repleted.  CKD stage IIIa Baseline of 1.2-1.4. Stable.  Acute metabolic encephalopathy Likely related to anesthesia and/or Zyprexa. Confusion prior to admission of unknown etiology but could also be related to Zyprexa versus hypernatremia. Urine culture negative. Patient was previously planned for an MRI which could not be completed at last hospitalization. Zyprexa discontinued. Cognition improved. MRI without acute abnormality.  Moderate cervical stenosis No obvious symptoms. Outpatient follow-up with neurosurgery.  Primary hypertension -Continue amlodipine  History of PE On  Eliquis as an outpatient which was held secondary to surgery -Continue Eliquis today  Normocytic anemia Trended down. Possible related to IV fluids and dilution. No associated blood loss noted. Stable.   Severe malnutrition Dietitian recommendations: - will order Ensure Surgery BID, each supplement provides 330 kcal and 18 grams of protein - will order Magic Cup BID with meals, each supplement provides 290 kcal and 9 grams of protein. - will order 30 ml Prosource Plus once/day, each supplement provides 100 kcal and 15 grams protein.  - will order 1 tablet multivitamin with minerals/day. - recommend 250 mg ascorbic acid BID and 220 mg zinc sulfate/day to promote wound healing.  Possible UTI, ruled out. Urinalysis suggests possible infection however urine culture is negative. Patient was started empirically on Cefepime which is discontinued.   DVT prophylaxis: SCDs Code Status:   Code Status: DNR Family Communication: Family at bedside Disposition Plan: Discharge to SNF when bed is available   Consultants:  Urology  Procedures:  CYSTOLITHOTRIPSY/RIGHT RETROGRADE PYELOGRAM/REMOVAL OF RIGHT URETERAL STENT (01/03/2021)  Antimicrobials: Cefepime    Subjective: Patient reports no issues. Nurse reports patient was confused this morning, trying to get out of bed.  Objective: Vitals:   01/06/21 2132 01/07/21 0511 01/07/21 1346 01/07/21 1346  BP: 103/68 (!) 149/92 118/69 118/69  Pulse: 95 85 92 92  Resp: 16 16 16 16   Temp: 98.2 F (36.8 C) (!) 97.4 F (36.3 C) 97.9 F (36.6 C) 97.9 F (36.6 C)  TempSrc: Oral Oral    SpO2: 94% 97% 100% 100%  Weight:      Height:        Intake/Output Summary (Last 24 hours) at 01/07/2021 1726 Last data filed at 01/07/2021 1600 Gross per 24 hour  Intake 1200 ml  Output 2200 ml  Net -1000 ml    03/09/2021  01/02/21 1757  Weight: 57 kg    Examination:  General exam: Appears calm and comfortable Respiratory system: Clear to  auscultation. Respiratory effort normal. Cardiovascular system: S1 & S2 heard, RRR. No murmurs, rubs, gallops or clicks. Gastrointestinal system: Abdomen is nondistended, soft and nontender. No organomegaly or masses felt. Normal bowel sounds heard. Central nervous system: Alert and oriented to person; not oriented to place or time. No focal neurological deficits. Musculoskeletal: No edema. No calf tenderness Skin: No cyanosis. No rashes   Data Reviewed: I have personally reviewed following labs and imaging studies  CBC Lab Results  Component Value Date   WBC 9.0 01/06/2021   RBC 3.71 (L) 01/06/2021   HGB 9.6 (L) 01/06/2021   HCT 31.4 (L) 01/06/2021   MCV 84.6 01/06/2021   MCH 25.9 (L) 01/06/2021   PLT 161 01/06/2021   MCHC 30.6 01/06/2021   RDW 18.1 (H) 01/06/2021   LYMPHSABS 0.7 01/04/2021   MONOABS 0.2 01/04/2021   EOSABS 0.0 01/04/2021   BASOSABS 0.0 01/04/2021     Last metabolic panel Lab Results  Component Value Date   NA 142 01/05/2021   K 3.9 01/05/2021   CL 113 (H) 01/05/2021   CO2 25 01/05/2021   BUN 25 (H) 01/05/2021   CREATININE 0.92 01/05/2021   GLUCOSE 132 (H) 01/05/2021   GFRNONAA >60 01/05/2021   CALCIUM 8.2 (L) 01/05/2021   PROT 6.2 (L) 01/02/2021   ALBUMIN 2.2 (L) 01/02/2021   BILITOT 0.5 01/02/2021   ALKPHOS 48 01/02/2021   AST 15 01/02/2021   ALT 11 01/02/2021   ANIONGAP 4 (L) 01/05/2021    CBG (last 3)  No results for input(s): GLUCAP in the last 72 hours.    GFR: Estimated Creatinine Clearance: 44.7 mL/min (by C-G formula based on SCr of 0.92 mg/dL).  Coagulation Profile: No results for input(s): INR, PROTIME in the last 168 hours.  Recent Results (from the past 240 hour(s))  Urine Culture     Status: None   Collection Time: 01/02/21  6:11 PM   Specimen: Urine, Catheterized  Result Value Ref Range Status   Specimen Description   Final    URINE, CATHETERIZED Performed at [facility], [address],  [place], [place] [address]    Special Requests   Final    NONE Performed at [facility], [address], [place], [place] [address]    Culture   Final    NO GROWTH Performed at [facility], [address] [place], [place] [address]    Report Status 01/03/2021 FINAL  Final  SARS Coronavirus 2 by RT PCR (hospital order, performed in [facility] hospital lab) Nasopharyngeal Nasopharyngeal Swab     Status: None   Collection Time: 01/03/21  7:15 AM   Specimen: Nasopharyngeal Swab  Result Value Ref Range Status   SARS Coronavirus 2 NEGATIVE NEGATIVE Final    Comment: (NOTE) SARS-CoV-2 target nucleic acids are NOT DETECTED.  The SARS-CoV-2 RNA is generally detectable in upper and lower respiratory specimens during the acute phase of infection. The lowest concentration of SARS-CoV-2 viral copies this assay can detect is 250 copies / mL. A negative result does not preclude SARS-CoV-2 infection and should not be used as the sole basis for treatment or other patient management decisions.  A negative result may occur with improper specimen collection / handling, submission of specimen other than nasopharyngeal swab, presence of viral mutation(s) within the areas targeted by this assay, and inadequate  number of viral copies (<250 copies / mL). A negative result must be combined with clinical observations, patient history, and epidemiological information.  Fact Sheet for Patients:   BoilerBrush.com.cy  Fact Sheet for Healthcare Providers: https://pope.com/  This test is not yet approved or  cleared by the [place] FDA and has been authorized for detection and/or diagnosis of SARS-CoV-2 by FDA under an Emergency Use Authorization (EUA).  This EUA will remain in effect (meaning this test can be used) for the duration of the COVID-19 declaration under Section 564(b)(1) of the Act, 21 U.S.C. section 360bbb-3(b)(1),  unless the authorization is terminated or revoked sooner.  Performed at [facility], [address], [place], [place] [address]   Surgical PCR screen     Status: None   Collection Time: 01/04/21 10:21 AM   Specimen: Nasal Mucosa; Nasal Swab  Result Value Ref Range Status   MRSA, PCR NEGATIVE NEGATIVE Final   Staphylococcus aureus NEGATIVE NEGATIVE Final    Comment: (NOTE) The Xpert SA Assay (FDA approved for NASAL specimens in patients 61 years of age and older), is one component of a comprehensive surveillance program. It is not intended to diagnose infection nor to guide or monitor treatment. Performed at [facility], [address] [place], [place] [address]          Radiology Studies: DG CHEST PORT 1 VIEW  Result Date: 01/06/2021 CLINICAL DATA:  Rales and weakness EXAM: PORTABLE CHEST 1 VIEW COMPARISON:  12/14/2020 FINDINGS: The heart size and mediastinal contours are within normal limits. Both lungs are clear. The visualized skeletal structures are unremarkable. IMPRESSION: No active disease. Electronically Signed   By: Marlan Palau M.D.   On: 01/06/2021 12:51        Scheduled Meds:  (feeding supplement) PROSource Plus  30 mL Oral Daily   amLODipine  5 mg Oral Daily   apixaban  5 mg Oral BID   vitamin C  250 mg Oral Daily   Chlorhexidine Gluconate Cloth  6 each Topical Daily   COVID-19 mRNA bivalent vaccine (Pfizer)  0.3 mL Intramuscular Once   feeding supplement  237 mL Oral BID BM   finasteride  5 mg Oral Daily   multivitamin with minerals  1 tablet Oral Daily   mupirocin ointment  1 application Nasal BID   tamsulosin  0.4 mg Oral Daily   zinc sulfate  220 mg Oral Daily   Continuous Infusions:     LOS: 5 days     Jacquelin Hawking, MD [facility] 01/07/2021, 5:26 PM  If 7PM-7AM, please contact night-coverage [email or web address]

## 2021-01-08 ENCOUNTER — Encounter (HOSPITAL_COMMUNITY): Payer: Self-pay | Admitting: Family Medicine

## 2021-01-08 ENCOUNTER — Inpatient Hospital Stay (HOSPITAL_COMMUNITY): Payer: Medicare HMO

## 2021-01-08 DIAGNOSIS — E870 Hyperosmolality and hypernatremia: Secondary | ICD-10-CM | POA: Diagnosis not present

## 2021-01-08 DIAGNOSIS — I10 Essential (primary) hypertension: Secondary | ICD-10-CM | POA: Diagnosis not present

## 2021-01-08 DIAGNOSIS — N1330 Unspecified hydronephrosis: Secondary | ICD-10-CM | POA: Diagnosis not present

## 2021-01-08 DIAGNOSIS — G9341 Metabolic encephalopathy: Secondary | ICD-10-CM | POA: Diagnosis not present

## 2021-01-08 MED ORDER — SODIUM CHLORIDE 0.45 % IV SOLN: INTRAVENOUS | Status: DC

## 2021-01-08 MED ORDER — MELATONIN 5 MG PO TABS
5.0000 mg | ORAL_TABLET | Freq: Once | ORAL | Status: AC
  Administered 2021-01-08: 5 mg via ORAL
  Filled 2021-01-08: qty 1

## 2021-01-08 NOTE — Progress Notes (Signed)
PROGRESS NOTE    Corey Cummings  BJS:[id number] DOB: 20-Oct-1930 DOA: 01/02/2021 PCP: [name], MD   Brief Narrative: Corey Cummings is a 87 y.o. male with a history of hypertension, chronic indwelling Foley catheter, nephrolithiasis, history of PE on Eliquis, BPH.  Patient presented secondary to confusion and was found to have evidence of persistent right-sided urolithiasis and ureteral obstruction with associated right-sided hydronephrosis.  Urology was consulted and performed cystoscopy lithotripsy with removal of right ureteral stent and also replacement of Foley catheter.   Assessment & Plan:   Active Problems:   Ureteral obstruction, right   CKD (chronic kidney disease), stage III (HCC)   Acute metabolic encephalopathy   Hydronephrosis of right kidney   Complicated UTI (urinary tract infection)   Hypernatremia   Essential hypertension   Right sided urolithiasis Ureteral obstruction Right sided hydronephrosis Urology consulted and performed cystolithotripsy and removal of right ureteral stent on 9/27. Outpatient urology follow-up.  Hypernatremia Secondary to poor oral intake. Resolved with IV fluids. Stable. Some confusion after stopping IV fluids however sodium is stable on repeat BMP.  Chronic foley catheter Exchanged 9/28. Continue.  Hypokalemia Hypomagnesemia Repleted.  CKD stage IIIa Baseline of 1.2-1.4. Stable.  Acute metabolic encephalopathy Likely related to anesthesia and/or Zyprexa. Confusion prior to admission of unknown etiology but could also be related to Zyprexa versus hypernatremia. Urine culture negative. Patient was previously planned for an MRI which could not be completed at last hospitalization. Zyprexa discontinued. Cognition improved but seems to wax and wane. MRI without acute abnormality. Recommend outpatient neurology/neuropsychology follow-up.  Moderate cervical stenosis No obvious symptoms. Outpatient follow-up with  neurosurgery.  Primary hypertension -Continue amlodipine  History of PE On Eliquis as an outpatient which was held secondary to surgery -Continue Eliquis today  Normocytic anemia Trended down. Possible related to IV fluids and dilution. No associated blood loss noted. Stable.   Severe malnutrition Dietitian recommendations: - will order Ensure Surgery BID, each supplement provides 330 kcal and 18 grams of protein - will order Magic Cup BID with meals, each supplement provides 290 kcal and 9 grams of protein. - will order 30 ml Prosource Plus once/day, each supplement provides 100 kcal and 15 grams protein.  - will order 1 tablet multivitamin with minerals/day. - recommend 250 mg ascorbic acid BID and 220 mg zinc sulfate/day to promote wound healing.  Possible UTI, ruled out. Urinalysis suggests possible infection however urine culture is negative. Patient was started empirically on Cefepime which is discontinued.   DVT prophylaxis: SCDs Code Status:   Code Status: DNR Family Communication: None at bedside Disposition Plan: Discharge to SNF when bed is available   Consultants:  Urology  Procedures:  CYSTOLITHOTRIPSY/RIGHT RETROGRADE PYELOGRAM/REMOVAL OF RIGHT URETERAL STENT (01/03/2021)  Antimicrobials: Cefepime    Subjective: No issues noted overnight.  Objective: Vitals:   01/07/21 1346 01/07/21 1346 01/07/21 2150 01/08/21 0538  BP: 118/69 118/69 131/78 136/75  Pulse: 92 92 94 81  Resp: 16 16 20 16   Temp: 97.9 F (36.6 C) 97.9 F (36.6 C) 97.9 F (36.6 C) 97.8 F (36.6 C)  TempSrc:   Oral Oral  SpO2: 100% 100% 98% 97%  Weight:      Height:        Intake/Output Summary (Last 24 hours) at 01/08/2021 0934 Last data filed at 01/08/2021 0538 Gross per 24 hour  Intake 1320 ml  Output 1550 ml  Net -230 ml    Filed Weights   01/02/21 1757  Weight: 57 kg  Examination:  General exam: Appears calm and comfortable Respiratory system: Diminished right  lung sounds. Respiratory effort normal. Cardiovascular system: S1 & S2 heard, RRR. No murmurs, rubs, gallops or clicks. Gastrointestinal system: Abdomen is nondistended, soft and nontender. No organomegaly or masses felt. Normal bowel sounds heard. Musculoskeletal: No edema. No calf tenderness Skin: No cyanosis. No rashes   Data Reviewed: I have personally reviewed following labs and imaging studies  CBC Lab Results  Component Value Date   WBC 9.0 01/06/2021   RBC 3.71 (L) 01/06/2021   HGB 9.6 (L) 01/06/2021   HCT 31.4 (L) 01/06/2021   MCV 84.6 01/06/2021   MCH 25.9 (L) 01/06/2021   PLT 161 01/06/2021   MCHC 30.6 01/06/2021   RDW 18.1 (H) 01/06/2021   LYMPHSABS 0.7 01/04/2021   MONOABS 0.2 01/04/2021   EOSABS 0.0 01/04/2021   BASOSABS 0.0 01/04/2021     Last metabolic panel Lab Results  Component Value Date   NA 143 01/07/2021   K 4.3 01/07/2021   CL 111 01/07/2021   CO2 27 01/07/2021   BUN 28 (H) 01/07/2021   CREATININE 0.95 01/07/2021   GLUCOSE 105 (H) 01/07/2021   GFRNONAA >60 01/07/2021   CALCIUM 8.8 (L) 01/07/2021   PROT 6.2 (L) 01/02/2021   ALBUMIN 2.2 (L) 01/02/2021   BILITOT 0.5 01/02/2021   ALKPHOS 48 01/02/2021   AST 15 01/02/2021   ALT 11 01/02/2021   ANIONGAP 5 01/07/2021    CBG (last 3)  No results for input(s): GLUCAP in the last 72 hours.    GFR: Estimated Creatinine Clearance: 43.3 mL/min (by C-G formula based on SCr of 0.95 mg/dL).  Coagulation Profile: No results for input(s): INR, PROTIME in the last 168 hours.  Recent Results (from the past 240 hour(s))  Urine Culture     Status: None   Collection Time: 01/02/21  6:11 PM   Specimen: Urine, Catheterized  Result Value Ref Range Status   Specimen Description   Final    URINE, CATHETERIZED Performed at [facility], [address], [place], [place] [address]    Special Requests   Final    NONE Performed at [facility], [address]  [place], [place] [address]    Culture   Final    NO GROWTH Performed at [facility], [address] [place], [place] [address]    Report Status 01/03/2021 FINAL  Final  SARS Coronavirus 2 by RT PCR (hospital order, performed in [facility] hospital lab) Nasopharyngeal Nasopharyngeal Swab     Status: None   Collection Time: 01/03/21  7:15 AM   Specimen: Nasopharyngeal Swab  Result Value Ref Range Status   SARS Coronavirus 2 NEGATIVE NEGATIVE Final    Comment: (NOTE) SARS-CoV-2 target nucleic acids are NOT DETECTED.  The SARS-CoV-2 RNA is generally detectable in upper and lower respiratory specimens during the acute phase of infection. The lowest concentration of SARS-CoV-2 viral copies this assay can detect is 250 copies / mL. A negative result does not preclude SARS-CoV-2 infection and should not be used as the sole basis for treatment or other patient management decisions.  A negative result may occur with improper specimen collection / handling, submission of specimen other than nasopharyngeal swab, presence of viral mutation(s) within the areas targeted by this assay, and inadequate number of viral copies (<250 copies / mL). A negative result must be combined with clinical observations, patient history, and epidemiological information.  Fact Sheet for Patients:  BoilerBrush.com.cy  Fact Sheet for Healthcare Providers: https://pope.com/  This test is not yet approved or  cleared by the [place] FDA and has been authorized for detection and/or diagnosis of SARS-CoV-2 by FDA under an Emergency Use Authorization (EUA).  This EUA will remain in effect (meaning this test can be used) for the duration of the COVID-19 declaration under Section 564(b)(1) of the Act, 21 U.S.C. section 360bbb-3(b)(1), unless the authorization is terminated or revoked sooner.  Performed at [facility], [address], [place], [place] [address]   Surgical PCR screen     Status: None   Collection Time: 01/04/21 10:21 AM   Specimen: Nasal Mucosa; Nasal Swab  Result Value Ref Range Status   MRSA, PCR NEGATIVE NEGATIVE Final   Staphylococcus aureus NEGATIVE NEGATIVE Final    Comment: (NOTE) The Xpert SA Assay (FDA approved for NASAL specimens in patients 39 years of age and older), is one component of a comprehensive surveillance program. It is not intended to diagnose infection nor to guide or monitor treatment. Performed at [facility], [address], [place], [place] [address]          Radiology Studies: No results found.      Scheduled Meds:  (feeding supplement) PROSource Plus  30 mL Oral Daily   amLODipine  5 mg Oral Daily   apixaban  5 mg Oral BID   vitamin C  250 mg Oral Daily   Chlorhexidine Gluconate Cloth  6 each Topical Daily   COVID-19 mRNA bivalent vaccine (Pfizer)  0.3 mL Intramuscular Once   feeding supplement  237 mL Oral BID BM   finasteride  5 mg Oral Daily   multivitamin with minerals  1 tablet Oral Daily   mupirocin ointment  1 application Nasal BID   tamsulosin  0.4 mg Oral Daily   zinc sulfate  220 mg Oral Daily   Continuous Infusions:     LOS: 6 days     Jacquelin Hawking, MD [facility] 01/08/2021, 9:34 AM  If 7PM-7AM, please contact night-coverage [email or web address]

## 2021-01-09 DIAGNOSIS — N1330 Unspecified hydronephrosis: Secondary | ICD-10-CM | POA: Diagnosis not present

## 2021-01-09 DIAGNOSIS — I10 Essential (primary) hypertension: Secondary | ICD-10-CM | POA: Diagnosis not present

## 2021-01-09 LAB — RESP PANEL BY RT-PCR (FLU A&B, COVID) ARPGX2
Influenza A by PCR: NEGATIVE
Influenza B by PCR: NEGATIVE
SARS Coronavirus 2 by RT PCR: NEGATIVE

## 2021-01-09 MED ORDER — ZINC SULFATE 220 (50 ZN) MG PO CAPS: 220.0000 mg | ORAL_CAPSULE | Freq: Every day | ORAL | Status: DC

## 2021-01-09 MED ORDER — ASCORBIC ACID 250 MG PO TABS
250.0000 mg | ORAL_TABLET | Freq: Every day | ORAL | Status: DC
Start: 2021-01-09 — End: 2021-02-09

## 2021-01-09 NOTE — Discharge Summary (Addendum)
Physician Discharge Summary  Antero Derosia DTO:[id number] DOB: Aug 30, 1930 DOA: 01/02/2021  PCP: [name], MD  Admit date: 01/02/2021 Discharge date: 01/09/2021  Admitted From: SNF Disposition: SNF  Recommendations for Outpatient Follow-up:  Follow up with PCP in 1 week Recommend neurology or neuropsychology follow-up Avoid sedating medications Please follow up on the following pending results: None   Discharge Condition: Stable CODE STATUS: DNR Diet recommendation: Regular diet   Brief/Interim Summary:  Admission HPI written by Carlton Adam, MD  HPI: Samer Dutton is a 87 y.o. male with medical history significant for HTN, chronic indwelling Foley catheter, nephrolithiasis, ED/PE on Xarelto, and BPH presents with complaints of being noted to be acutely altered.  History is mostly obtained from the patient's daughter who is present at bedside as he is acutely altered.  At baseline patient had normally been alert and oriented x3.   Patient had just been hospitalized from 8/28 -8/31 for severe sepsis secondary to catheter associated urinary tract infection with hydronephrosis secondary to right UVJ calculus.  Urology has been c consulted and placed stents.  Urine cultures grew out Citrobacter freundii(resistant to cefazolin, Rocephin, and nitrofurantoin. He was admitted on 12/14/20 to 12/29/20 and treated with cefepime as well as Diflucan as there is yeast in his urine based on cultures. He was discharged to SNF.  He was sent to ER today with elevated sodium level and confusion.  Daughter reports that when she went to visit him he was staring at the ceiling and reaching for things that were not there and was combative which is not his normal self.  There was no report of any fever or chills.  There is no report of any vomiting or diarrhea.  He has been weak and is having physical therapy at the skilled nursing facility.  He is confused not able to provide any history himself.   Daughter states that he has not had any falls that she knows of for head trauma.    Hospital course:  Right sided urolithiasis Ureteral obstruction Right sided hydronephrosis Urology consulted and performed cystolithotripsy and removal of right ureteral stent on 9/27. Outpatient urology follow-up.   Hypernatremia Secondary to poor oral intake. Resolved with IV fluids. Stable. Some confusion after stopping IV fluids however sodium is stable on repeat BMP.   Chronic foley catheter Exchanged 9/28. Continue.   Hypokalemia Hypomagnesemia Repleted.   CKD stage IIIa Baseline of 1.2-1.4. Stable.   Acute metabolic encephalopathy Likely related to anesthesia and/or Zyprexa. Confusion prior to admission of unknown etiology but could also be related to Zyprexa versus hypernatremia. Urine culture negative. Patient was previously planned for an MRI which could not be completed at last hospitalization. Zyprexa discontinued. Cognition improved but seems to wax and wane. MRI without acute abnormality. Recommend outpatient neurology/neuropsychology follow-up.   Moderate cervical stenosis No obvious symptoms. Outpatient follow-up with neurosurgery.   Primary hypertension Continue amlodipine   History of PE On Eliquis as an outpatient which was held secondary to surgery. Continue Eliquis today   Normocytic anemia Trended down. Possible related to IV fluids and dilution. No associated blood loss noted. Stable.  Possible UTI Negative urine culture. Ruled out.  Discharge Diagnoses:  Active Problems:   Ureteral obstruction, right   CKD (chronic kidney disease), stage III (HCC)   Acute metabolic encephalopathy   Hydronephrosis of right kidney   Hypernatremia   Essential hypertension    Discharge Instructions  Discharge Instructions     Diet - low sodium heart healthy  Complete by: As directed    No wound care   Complete by: As directed       Allergies as of 01/09/2021   No  Known Allergies      Medication List     STOP taking these medications    OLANZapine 5 MG tablet Commonly known as: ZYPREXA   OLANZapine zydis 5 MG disintegrating tablet Commonly known as: ZYPREXA       TAKE these medications    acidophilus Caps capsule Take 1 capsule by mouth daily.   amLODipine 5 MG tablet Commonly known as: NORVASC Take 1 tablet (5 mg total) by mouth daily.   apixaban 5 MG Tabs tablet Commonly known as: ELIQUIS Take 5 mg by mouth 2 (two) times daily.   ascorbic acid 250 MG tablet Commonly known as: VITAMIN C Take 1 tablet (250 mg total) by mouth daily.   feeding supplement Liqd Take 237 mLs by mouth 2 (two) times daily between meals.   finasteride 5 MG tablet Commonly known as: PROSCAR Take 5 mg by mouth at bedtime.   Fusion Plus Caps Take 1 capsule by mouth daily.   multivitamin with minerals Tabs tablet Take 1 tablet by mouth daily.   tamsulosin 0.4 MG Caps capsule Commonly known as: FLOMAX Take 0.4 mg by mouth daily.   thiamine 100 MG tablet Take 1 tablet (100 mg total) by mouth daily.   zinc sulfate 220 (50 Zn) MG capsule Take 1 capsule (220 mg total) by mouth daily.        Follow-up Information     [name], MD. Schedule an appointment as soon as possible for a visit in 1 week(s).   Specialty: Family Medicine Why: For hospital follow-up Contact information: [address] [place] [address] [phone]         [facility]. Schedule an appointment as soon as possible for a visit.   Specialty: Neurology Why: Possible cognitive impairment Contact information: [address] [place] [address] [phone]               No Known Allergies  Consultations: Urology   Procedures/Studies: DG Chest 1 View  Result Date: 12/14/2020 CLINICAL DATA:  Fall. EXAM: CHEST  1 VIEW COMPARISON:  Multiple chest radiographs, most recently  12/04/2020. CT chest, 02/24/2020. FINDINGS: Cardiac silhouette is within normal limits. Tortuous thoracic aorta. Lungs are well inflated. No focal consolidation or mass. No pleural effusion or pneumothorax. No acute displaced fracture. IMPRESSION: Normal chest. Electronically Signed   By: Roanna Banning M.D.   On: 12/14/2020 07:32   DG Pelvis 1-2 Views  Result Date: 12/14/2020 CLINICAL DATA:  Fall. EXAM: PELVIS - 1-2 VIEW COMPARISON:  CT Abdomen Pelvis, 12/04/2020. FINDINGS: There is no evidence of pelvic fracture or diastasis. No pelvic bone lesions are seen. Large RIGHT distal ureteral stone. RIGHT nephroureteral catheter, incompletely imaged. IMPRESSION: 1. No acute fracture dislocation within the pelvis or proximal femurs. 2. Large RIGHT distal ureteral nephrolith, with nephroureteral catheter. Electronically Signed   By: Roanna Banning M.D.   On: 12/14/2020 07:35   CT HEAD WO CONTRAST ( )  Result Date: 12/14/2020 CLINICAL DATA:  Facial trauma EXAM: CT HEAD WITHOUT CONTRAST TECHNIQUE: Contiguous axial images were obtained from the base of the skull through the vertex without intravenous contrast. COMPARISON:  None. FINDINGS: Motion limited study.  Within this limitation: Brain: No evidence of acute infarction, hemorrhage, hydrocephalus, extra-axial collection or mass lesion/mass effect. Mild for age scattered  white matter hypodensities, nonspecific but compatible with chronic microvascular ischemic disease. Mild for age atrophy with ex vacuo ventricular dilation. Vascular: Calcific atherosclerosis. No hyperdense vessel identified. Skull: No acute fracture. Sinuses/Orbits: Clear sinuses.  No acute orbital finding. Other: No mastoid effusions. IMPRESSION: 1. No evidence of acute intracranial abnormality on this motion limited exam. 2. Mild for age chronic microvascular ischemic disease and atrophy. Electronically Signed   By: Feliberto Harts M.D.   On: 12/14/2020 07:31   CT Cervical Spine Wo  Contrast  Result Date: 12/14/2020 CLINICAL DATA:  Neck trauma.  Altered mental status post fall. EXAM: CT CERVICAL SPINE WITHOUT CONTRAST TECHNIQUE: Multidetector CT imaging of the cervical spine was performed without intravenous contrast. Multiplanar CT image reconstructions were also generated. COMPARISON:  Chest CTA 02/24/2020. FINDINGS: Alignment: Straightening without focal angulation or listhesis. Skull base and vertebrae: No evidence of acute fracture or traumatic subluxation. Prominent asymmetric facet arthropathy on the right at C2-3. Soft tissues and spinal canal: No prevertebral fluid or swelling. No visible canal hematoma. Disc levels: There is multilevel cervical spondylosis. At C2-3, uncinate spurring and asymmetric right-sided facet hypertrophy contribute to mild spinal stenosis and right foraminal narrowing. There is prominent posterior osteophyte formation on the right at C3-4 which may contribute to some mass effect on the cord and medial foraminal narrowing. There is chronic loss of disc height with uncinate spurring at C4-5 and C6-7. No large disc herniation identified. Upper chest: Asymmetric enlargement of the left thyroid lobe appears unchanged from previous chest CTA. No discrete nodule identified. Central airway thickening and scattered peribronchial nodularity of both lung apices, similar to previous CT. Other: None. IMPRESSION: 1. No evidence of acute cervical spine fracture or traumatic subluxation. 2. Multilevel cervical spondylosis as described. 3. Stable incidental findings including asymmetric enlargement of the left thyroid lobe, apical central airway thickening and peribronchial nodularity. Electronically Signed   By: Carey Bullocks M.D.   On: 12/14/2020 11:46   MR BRAIN WO CONTRAST  Result Date: 01/05/2021 CLINICAL DATA:  Mental status change, unknown cause EXAM: MRI HEAD WITHOUT CONTRAST TECHNIQUE: Multiplanar, multiecho pulse sequences of the brain and surrounding  structures were obtained without intravenous contrast. COMPARISON:  CT head 12/14/2020. FINDINGS: Motion limited study.  Within this limitation: Brain: No acute infarction, acute hemorrhage, hydrocephalus, extra-axial collection or mass lesion. Moderate atrophy with ex vacuo ventricular dilation. Mild for age scattered T2 hyperintensities within the white matter, nonspecific but compatible with chronic microvascular ischemic disease. Small focus of susceptibility artifact in the left cerebellum without associated edema, likely prior microhemorrhage. Vascular: Non dominant right vertebral artery. Major arterial flow voids are maintained at the skull base. Skull and upper cervical spine: Normal marrow signal. Degenerative change in the upper cervical spine with likely at least moderate canal stenosis at C2-C3. Sinuses/Orbits: Clear sinuses.  Unremarkable orbits. Other: No mastoid effusions. IMPRESSION: 1. No evidence of acute intracranial abnormality on this motion limited study. 2. Moderate atrophy and mild chronic microvascular ischemic disease. 3. Degenerative change in the upper cervical spine with likely at least moderate canal stenosis at C2-C3. An MRI of the cervical spine could further characterize if clinically indicated. Electronically Signed   By: Feliberto Harts M.D.   On: 01/05/2021 14:14   DG CHEST PORT 1 VIEW  Result Date: 01/08/2021 CLINICAL DATA:  Decreased breath sounds. Patient admitted 6 days ago for kidney stone. EXAM: PORTABLE CHEST 1 VIEW COMPARISON:  January 06, 2021 FINDINGS: Mild opacity in the right base. The heart, hila, mediastinum, lungs,  and pleura are otherwise unremarkable. IMPRESSION: Mild opacity in the right base could represent atelectasis or early developing infiltrate. Recommend attention on follow-up. Electronically Signed   By: Gerome Sam III M.D.   On: 01/08/2021 11:53   DG CHEST PORT 1 VIEW  Result Date: 01/06/2021 CLINICAL DATA:  Rales and weakness EXAM:  PORTABLE CHEST 1 VIEW COMPARISON:  12/14/2020 FINDINGS: The heart size and mediastinal contours are within normal limits. Both lungs are clear. The visualized skeletal structures are unremarkable. IMPRESSION: No active disease. Electronically Signed   By: Marlan Palau M.D.   On: 01/06/2021 12:51   DG C-Arm 1-60 Min-No Report  Result Date: 01/03/2021 Fluoroscopy was utilized by the requesting physician.  No radiographic interpretation.   CT Renal Stone Study  Result Date: 01/02/2021 CLINICAL DATA:  Urinary tract calculus EXAM: CT ABDOMEN AND PELVIS WITHOUT CONTRAST TECHNIQUE: Multidetector CT imaging of the abdomen and pelvis was performed following the standard protocol without IV contrast. COMPARISON:  12/14/2020 FINDINGS: Lower chest: Small right pleural effusion has developed in the interim. No acute airspace disease. Hepatobiliary: High attenuation material within the gallbladder likely reflects gallbladder sludge. No calcified gallstones or cholecystitis. Unenhanced imaging of the liver is unremarkable. Pancreas: Unremarkable. No pancreatic ductal dilatation or surrounding inflammatory changes. Spleen: Normal in size without focal abnormality. Adrenals/Urinary Tract: A large obstructing calculus at the right UVJ is again identified, measuring up to 3.3 x 1.4 cm. The right ureteral stent seen on prior study is again identified, proximal aspect coiled within the right UPJ and the distal aspect coiled within the distal right ureter and right UVJ abutting the calculus. There is been recurrence of severe high-grade right-sided obstructive uropathy with marked hydronephrosis and hydroureter. Portion configuration of the kidneys again noted, with marked atrophy of the right-sided moiety. Chronic dilation of the left renal pelvis and proximal left ureter without clear cause for obstruction. The adrenals are unremarkable. There is gas in the bladder lumen. A Foley catheter is identified within the prostatic  urethra. The balloon is inflated within the region of the membranous urethra. Stomach/Bowel: No bowel obstruction or ileus. Distal colonic diverticulosis without diverticulitis. No bowel Kraeger thickening or inflammatory change. Vascular/Lymphatic: Aortic atherosclerosis. No enlarged abdominal or pelvic lymph nodes. Reproductive: The prostate is enlarged but stable. Other: No free fluid or free gas.  No abdominal Zettlemoyer hernia. Musculoskeletal: No acute or destructive bony lesions. Chronic L1 compression deformity unchanged. Reconstructed images demonstrate no additional findings. IMPRESSION: 1. Recurrent severe right-sided hydronephrosis and hydroureter related to an obstructing 3.3 cm right UVJ calculus. No significant change in the position of the right ureteral stent, extending from the right UPJ to the region of the right UVJ. 2. Malpositioning of the Foley catheter, with the balloon inflated within the prostatic/membranous urethra. Recommend removal and replacement. 3. Stable nonobstructing right renal calculi. 4. Horseshoe configuration of the kidneys, with marked atrophy of the right moiety. 5. Small right pleural effusion. 6. Gallbladder sludge. No evidence of cholelithiasis or cholecystitis. 7.  Aortic Atherosclerosis (ICD10-I70.0). Critical Value/emergent results were called by telephone at the time of interpretation on 01/02/2021 at 8:43 pm to provider [facility] , who verbally acknowledged these results. Electronically Signed   By: Sharlet Salina M.D.   On: 01/02/2021 20:46   CT Renal Stone Study  Result Date: 12/14/2020 CLINICAL DATA:  Acute confusion, recent discharge from sepsis due to recurrent UTIs, history of indwelling Foley catheter, bilateral hydronephrosis EXAM: CT ABDOMEN AND PELVIS WITHOUT CONTRAST TECHNIQUE: Multidetector CT imaging of  the abdomen and pelvis was performed following the standard protocol without IV contrast. COMPARISON:  CT abdomen/pelvis 12/04/2020 FINDINGS: Lower chest:  The lung bases are clear. The imaged heart is unremarkable. Hepatobiliary: The liver and gallbladder are unremarkable. There is no biliary ductal dilatation. Pancreas: Unremarkable. Spleen: Unremarkable. Adrenals/Urinary Tract: The adrenals are unremarkable. Horseshoe kidney morphology is again seen. There has been interval placement of a right-sided double-J stent with tips coiled in the proximal collecting system and distal ureter. The previously seen right-sided hydronephrosis has markedly improved though not entirely resolved. The large calculus at the UVJ measuring 3.0 cm is unchanged in position. Marked atrophy of the right renal parenchyma with multiple additional nonobstructing renal stones are similar to the prior study. A small locule of air remains in the patulous renal pelvis (3-32). Previously seen perinephric and periureteral stranding has improved. Left hydronephrosis has significantly improved with mild residual dilation of the renal pelvis. A Foley catheter tip is in the bladder. A left bladder diverticulum is again seen. Stomach/Bowel: The stomach is unremarkable. There is no evidence of bowel obstruction. There is no definite bowel Dooley thickening or inflammatory change. There is colonic diverticulosis without evidence of acute diverticulitis. Vascular/Lymphatic: There is calcified atherosclerotic plaque throughout the nonaneurysmal abdominal aorta. There is no abdominal or pelvic lymphadenopathy. Reproductive: The prostate is markedly enlarged and impresses upon the inferior aspect of the bladder. The seminal vesicles are unremarkable. Other: There is trace fluid in the presacral space, nonspecific. There is no free intraperitoneal air. A right inguinal hernia containing a short loop of unobstructed bowel is again seen. Musculoskeletal: There is no acute osseous abnormality or aggressive osseous lesion. Compression deformity of the L1 vertebral body is unchanged. IMPRESSION: 1. Interval placement  of a right double-J stent with tips coiled in the collecting system and distal ureter adjacent to the large UVJ stone which is unchanged in position. Previously seen severe right hydronephrosis has markedly improved. 2. Small locule of air remains in the patulous right collecting system which may be related to interval intervention. Persistent infection is not entirely excluded by imaging, though previously seen perinephric and periureteral stranding has significantly improved. 3. Significantly improved left hydronephrosis with only mild residual dilation of the collecting system. 4. Unchanged horseshoe morphology of the kidneys with marked atrophy of the right moiety. Additional nonobstructing calculi in the right are overall unchanged. 5.  Aortic Atherosclerosis (ICD10-I70.0). Electronically Signed   By: Lesia Hausen M.D.   On: 12/14/2020 08:52      Subjective: No concerns today. Feels well  Discharge Exam: Vitals:   01/08/21 2134 01/09/21 0513  BP: (!) 146/83 109/76  Pulse: 73 70  Resp: 16 16  Temp: 97.8 F (36.6 C) 97.6 F (36.4 C)  SpO2: 99% 96%   Vitals:   01/08/21 1112 01/08/21 1346 01/08/21 2134 01/09/21 0513  BP: 126/79 (!) 141/77 (!) 146/83 109/76  Pulse: 69 70 73 70  Resp: 16 16 16 16   Temp: 98.1 F (36.7 C) (!) 97.5 F (36.4 C) 97.8 F (36.6 C) 97.6 F (36.4 C)  TempSrc: Oral Oral Oral Oral  SpO2: 99% 95% 99% 96%  Weight:      Height:        General exam: Appears calm and comfortable Respiratory system: Clear to auscultation. Respiratory effort normal. Cardiovascular system: S1 & S2 heard, RRR. No murmurs, rubs, gallops or clicks. Gastrointestinal system: Abdomen is nondistended, soft and nontender. No organomegaly or masses felt. Normal bowel sounds heard. Central nervous system: Alert  and oriented. No focal neurological deficits. Musculoskeletal: No edema. No calf tenderness Skin: No cyanosis. No rashes Psychiatry: Judgement and insight appear normal. Mood &  affect appropriate.     The results of significant diagnostics from this hospitalization (including imaging, microbiology, ancillary and laboratory) are listed below for reference.     Microbiology: Recent Results (from the past 240 hour(s))  Urine Culture     Status: None   Collection Time: 01/02/21  6:11 PM   Specimen: Urine, Catheterized  Result Value Ref Range Status   Specimen Description   Final    URINE, CATHETERIZED Performed at [facility], [address], [place], [place] [address]    Special Requests   Final    NONE Performed at [facility], [address] [place], [place] [address]    Culture   Final    NO GROWTH Performed at [facility], [address] [place], [place] [address]    Report Status 01/03/2021 FINAL  Final  SARS Coronavirus 2 by RT PCR (hospital order, performed in [facility] hospital lab) Nasopharyngeal Nasopharyngeal Swab     Status: None   Collection Time: 01/03/21  7:15 AM   Specimen: Nasopharyngeal Swab  Result Value Ref Range Status   SARS Coronavirus 2 NEGATIVE NEGATIVE Final    Comment: (NOTE) SARS-CoV-2 target nucleic acids are NOT DETECTED.  The SARS-CoV-2 RNA is generally detectable in upper and lower respiratory specimens during the acute phase of infection. The lowest concentration of SARS-CoV-2 viral copies this assay can detect is 250 copies / mL. A negative result does not preclude SARS-CoV-2 infection and should not be used as the sole basis for treatment or other patient management decisions.  A negative result may occur with improper specimen collection / handling, submission of specimen other than nasopharyngeal swab, presence of viral mutation(s) within the areas targeted by this assay, and inadequate number of viral copies (<250 copies / mL). A negative result must be combined with clinical observations, patient history, and epidemiological information.  Fact Sheet for  Patients:   BoilerBrush.com.cy  Fact Sheet for Healthcare Providers: https://pope.com/  This test is not yet approved or  cleared by the [place] FDA and has been authorized for detection and/or diagnosis of SARS-CoV-2 by FDA under an Emergency Use Authorization (EUA).  This EUA will remain in effect (meaning this test can be used) for the duration of the COVID-19 declaration under Section 564(b)(1) of the Act, 21 U.S.C. section 360bbb-3(b)(1), unless the authorization is terminated or revoked sooner.  Performed at [facility], [address], [place], [place] [address]   Surgical PCR screen     Status: None   Collection Time: 01/04/21 10:21 AM   Specimen: Nasal Mucosa; Nasal Swab  Result Value Ref Range Status   MRSA, PCR NEGATIVE NEGATIVE Final   Staphylococcus aureus NEGATIVE NEGATIVE Final    Comment: (NOTE) The Xpert SA Assay (FDA approved for NASAL specimens in patients 45 years of age and older), is one component of a comprehensive surveillance program. It is not intended to diagnose infection nor to guide or monitor treatment. Performed at [facility], [address] [place], [place] [address]      Labs: BNP (last 3 results) Recent Labs    02/24/20 1223  BNP 285.5*   Basic Metabolic Panel: Recent Labs  Lab 01/02/21 1811 01/03/21 0500 01/04/21 0452 01/05/21 0433 01/07/21 1714  NA 151* 149* 144 142 143  K 3.8 3.1* 4.1 3.9 4.3  CL 118* 119* 117* 113* 111  CO2 GLUCOSE 164* 396* 223* 132* 105*  BUN 28* 28* 24* 25* 28*  CREATININE 1.22 0.91 0.74 0.92 0.95  CALCIUM 9.0 7.7* 8.2* 8.2* 8.8*  MG  --  1.5* 1.7  --   --    Liver Function Tests: Recent Labs  Lab 01/02/21 1811  AST 15  ALT 11  ALKPHOS 48  BILITOT 0.5  PROT 6.2*  ALBUMIN 2.2*   No results for input(s): LIPASE, AMYLASE in the last 168 hours. Recent Labs  Lab 01/05/21 0433   AMMONIA 24   CBC: Recent Labs  Lab 01/02/21 1811 01/03/21 0500 01/04/21 0452 01/05/21 0433 01/06/21 0430  WBC 8.0 8.1 5.7 9.5 9.0  NEUTROABS 5.3  --  4.8  --   --   HGB 10.4* 9.7* 8.5* 8.0* 9.6*  HCT 35.7* 33.0* 28.4* 26.5* 31.4*  MCV 87.5 86.4 85.3 84.1 84.6  PLT 298 262 197 203 161   Cardiac Enzymes: No results for input(s): CKTOTAL, CKMB, CKMBINDEX, TROPONINI in the last 168 hours. BNP: Invalid input(s): POCBNP CBG: Recent Labs  Lab 01/03/21 1402 01/03/21 1545  GLUCAP 106* 97   D-Dimer No results for input(s): DDIMER in the last 72 hours. Hgb A1c No results for input(s): HGBA1C in the last 72 hours. Lipid Profile No results for input(s): CHOL, HDL, LDLCALC, TRIG, CHOLHDL, LDLDIRECT in the last 72 hours. Thyroid function studies No results for input(s): TSH, T4TOTAL, T3FREE, THYROIDAB in the last 72 hours.  Invalid input(s): FREET3 Anemia work up No results for input(s): VITAMINB12, FOLATE, FERRITIN, TIBC, IRON, RETICCTPCT in the last 72 hours. Urinalysis    Component Value Date/Time   COLORURINE YELLOW 01/02/2021 1811   APPEARANCEUR CLOUDY (A) 01/02/2021 1811   LABSPEC 1.010 01/02/2021 1811   PHURINE 6.0 01/02/2021 1811   GLUCOSEU NEGATIVE 01/02/2021 1811   HGBUR LARGE (A) 01/02/2021 1811   BILIRUBINUR SMALL (A) 01/02/2021 1811   KETONESUR NEGATIVE 01/02/2021 1811   PROTEINUR TRACE (A) 01/02/2021 1811   NITRITE NEGATIVE 01/02/2021 1811   LEUKOCYTESUR LARGE (A) 01/02/2021 1811   Sepsis Labs Invalid input(s): PROCALCITONIN,  WBC,  LACTICIDVEN Microbiology Recent Results (from the past 240 hour(s))  Urine Culture     Status: None   Collection Time: 01/02/21  6:11 PM   Specimen: Urine, Catheterized  Result Value Ref Range Status   Specimen Description   Final    URINE, CATHETERIZED Performed at [facility], [address], [place], [place] [address]    Special Requests   Final    NONE Performed at [facility], [address], [place], [place] [address]    Culture   Final    NO GROWTH Performed at [facility], [address], [place], [place] [address]    Report Status 01/03/2021 FINAL  Final  SARS Coronavirus 2 by RT PCR (hospital order, performed in [facility] hospital lab) Nasopharyngeal Nasopharyngeal Swab     Status: None   Collection Time: 01/03/21  7:15 AM   Specimen: Nasopharyngeal Swab  Result Value Ref Range Status   SARS Coronavirus 2 NEGATIVE NEGATIVE Final    Comment: (NOTE) SARS-CoV-2 target nucleic acids are NOT DETECTED.  The SARS-CoV-2 RNA is generally detectable in upper and lower respiratory specimens during the acute phase of infection. The lowest concentration of SARS-CoV-2 viral copies this assay can detect is 250 copies / mL.  A negative result does not preclude SARS-CoV-2 infection and should not be used as the sole basis for treatment or other patient management decisions.  A negative result may occur with improper specimen collection / handling, submission of specimen other than nasopharyngeal swab, presence of viral mutation(s) within the areas targeted by this assay, and inadequate number of viral copies (<250 copies / mL). A negative result must be combined with clinical observations, patient history, and epidemiological information.  Fact Sheet for Patients:   BoilerBrush.com.cy  Fact Sheet for Healthcare Providers: https://pope.com/  This test is not yet approved or  cleared by the [place] FDA and has been authorized for detection and/or diagnosis of SARS-CoV-2 by FDA under an Emergency Use Authorization (EUA).  This EUA will remain in effect (meaning this test can be used) for the duration of the COVID-19 declaration under Section 564(b)(1) of the Act, 21 U.S.C. section 360bbb-3(b)(1), unless the authorization is terminated or revoked sooner.  Performed at [facility], [address] [place], [place] [address]   Surgical PCR screen     Status: None   Collection Time: 01/04/21 10:21 AM   Specimen: Nasal Mucosa; Nasal Swab  Result Value Ref Range Status   MRSA, PCR NEGATIVE NEGATIVE Final   Staphylococcus aureus NEGATIVE NEGATIVE Final    Comment: (NOTE) The Xpert SA Assay (FDA approved for NASAL specimens in patients 73 years of age and older), is one component of a comprehensive surveillance program. It is not intended to diagnose infection nor to guide or monitor treatment. Performed at [facility], [address], [place], [place] [address]      Time coordinating discharge: 35 minutes  SIGNED:   Jacquelin Hawking, MD [facility] 01/09/2021, 9:58 AM

## 2021-01-09 NOTE — Discharge Instructions (Addendum)
Corey Cummings,  You were in the hospital with high sodium and some confusion. This has improved with IV fluids and discontinuing some medications. You will go back to rehab for therapy before eventually going home. I recommend you see a neurologist and/or neuropsychologist to discuss and evaluate for possible cognitive impairment. It would be best to avoid sedating medications. You also had a urinary blockage which was managed by the Urologist.

## 2021-01-09 NOTE — Progress Notes (Signed)
Report called to [facility] at 4:32 PM.

## 2021-01-09 NOTE — TOC Transition Note (Signed)
Transition of Care [facility]) - CM/SW Discharge Note   Patient Details  Name: Corey Cummings MRN: [id number] Date of Birth: Jul 01, 1930  Transition of [facility]) CM/SW Contact:  Amada Jupiter, LCSW Phone Number: 01/09/2021, 1:01 PM   Clinical Narrative:    Have received insurance auth and pt is medically cleared for return today to [facility].  Pt / daughter aware and agreeable.  PTAR called at 1:00pm.  RN to call [phone] for report.  No further TOC needs.   Final next level of care: Skilled Nursing Facility Barriers to Discharge: Barriers Resolved   Patient Goals and CMS Choice Patient states their goals for this hospitalization and ongoing recovery are:: return back CMS Medicare.gov Compare Post Acute Care list provided to:: Patient Represenative (must comment) (dtr [id number]) Choice offered to / list presented to : Adult Children  Discharge Placement PASRR number recieved: 01/05/21            Patient chooses bed at: Other - please specify in the comment section below: ([facility]) Patient to be transferred to facility by: PTAR Name of family member notified: daughter Patient and family notified of of transfer: 01/09/21  Discharge Plan and Services   Discharge Planning Services: CM Consult Post Acute Care Choice: Skilled Nursing Facility                               Social Determinants of Health (SDOH) Interventions     Readmission Risk Interventions Readmission Risk Prevention Plan 01/05/2021  Transportation Screening Complete  Medication Review Ocean[facility]) Complete  PCP or Specialist appointment within 3-5 days of discharge Complete  HRI or Home Care Consult Complete  SW Recovery Care/Counseling Consult Complete  Skilled Nursing Facility Complete  Some recent data might be hidden

## 2021-01-10 NOTE — Progress Notes (Signed)
Patient was discharged with PTAR to [facility].  Patient was alert and oriented 3. Spoke coherently with staff.  Denied any pain. IV site  had been removed.  Patient's daughter Stanton Kidney was called to inform her that her dad was leaving [facility] to [facility]. She Fish farm manager.

## 2021-01-12 ENCOUNTER — Encounter (HOSPITAL_COMMUNITY): Payer: Self-pay

## 2021-01-12 ENCOUNTER — Emergency Department (HOSPITAL_COMMUNITY)
Admission: EM | Admit: 2021-01-12 | Discharge: 2021-01-13 | Disposition: A | Discharge status: Home / Self Care | Payer: Medicare HMO | Attending: Emergency Medicine | Admitting: Emergency Medicine

## 2021-01-12 ENCOUNTER — Other Ambulatory Visit: Payer: Self-pay

## 2021-01-12 ENCOUNTER — Emergency Department (HOSPITAL_COMMUNITY): Payer: Medicare HMO

## 2021-01-12 DIAGNOSIS — Z87891 Personal history of nicotine dependence: Secondary | ICD-10-CM | POA: Diagnosis not present

## 2021-01-12 DIAGNOSIS — Z86711 Personal history of pulmonary embolism: Secondary | ICD-10-CM | POA: Insufficient documentation

## 2021-01-12 DIAGNOSIS — W06XXXA Fall from bed, initial encounter: Secondary | ICD-10-CM | POA: Diagnosis not present

## 2021-01-12 DIAGNOSIS — R410 Disorientation, unspecified: Secondary | ICD-10-CM | POA: Insufficient documentation

## 2021-01-12 DIAGNOSIS — Z20822 Contact with and (suspected) exposure to covid-19: Secondary | ICD-10-CM | POA: Insufficient documentation

## 2021-01-12 DIAGNOSIS — N183 Chronic kidney disease, stage 3 unspecified: Secondary | ICD-10-CM | POA: Insufficient documentation

## 2021-01-12 DIAGNOSIS — Z79899 Other long term (current) drug therapy: Secondary | ICD-10-CM | POA: Diagnosis not present

## 2021-01-12 DIAGNOSIS — I129 Hypertensive chronic kidney disease with stage 1 through stage 4 chronic kidney disease, or unspecified chronic kidney disease: Secondary | ICD-10-CM | POA: Insufficient documentation

## 2021-01-12 DIAGNOSIS — Z7901 Long term (current) use of anticoagulants: Secondary | ICD-10-CM | POA: Insufficient documentation

## 2021-01-12 DIAGNOSIS — N390 Urinary tract infection, site not specified: Secondary | ICD-10-CM

## 2021-01-12 DIAGNOSIS — W19XXXA Unspecified fall, initial encounter: Secondary | ICD-10-CM

## 2021-01-12 LAB — URINALYSIS, ROUTINE W REFLEX MICROSCOPIC
Bilirubin Urine: NEGATIVE
Glucose, UA: NEGATIVE mg/dL
Ketones, ur: NEGATIVE mg/dL
Nitrite: NEGATIVE
Protein, ur: 30 mg/dL — AB
Specific Gravity, Urine: 1.011 (ref 1.005–1.030)
pH: 7 (ref 5.0–8.0)

## 2021-01-12 LAB — CBC WITH DIFFERENTIAL/PLATELET
Abs Immature Granulocytes: 0.02 10*3/uL (ref 0.00–0.07)
Basophils Absolute: 0 10*3/uL (ref 0.0–0.1)
Basophils Relative: 1 %
Eosinophils Absolute: 0.2 10*3/uL (ref 0.0–0.5)
Eosinophils Relative: 2 %
HCT: 29.8 % — ABNORMAL LOW (ref 39.0–52.0)
Hemoglobin: 9 g/dL — ABNORMAL LOW (ref 13.0–17.0)
Immature Granulocytes: 0 %
Lymphocytes Relative: 31 %
Lymphs Abs: 2.3 10*3/uL (ref 0.7–4.0)
MCH: 26 pg (ref 26.0–34.0)
MCHC: 30.2 g/dL (ref 30.0–36.0)
MCV: 86.1 fL (ref 80.0–100.0)
Monocytes Absolute: 0.6 10*3/uL (ref 0.1–1.0)
Monocytes Relative: 8 %
Neutro Abs: 4.5 10*3/uL (ref 1.7–7.7)
Neutrophils Relative %: 58 %
Platelets: 292 10*3/uL (ref 150–400)
RBC: 3.46 MIL/uL — ABNORMAL LOW (ref 4.22–5.81)
RDW: 18.4 % — ABNORMAL HIGH (ref 11.5–15.5)
WBC: 7.6 10*3/uL (ref 4.0–10.5)
nRBC: 0 % (ref 0.0–0.2)

## 2021-01-12 LAB — BASIC METABOLIC PANEL
Anion gap: 7 (ref 5–15)
BUN: 15 mg/dL (ref 8–23)
CO2: 26 mmol/L (ref 22–32)
Calcium: 8.4 mg/dL — ABNORMAL LOW (ref 8.9–10.3)
Chloride: 105 mmol/L (ref 98–111)
Creatinine, Ser: 0.96 mg/dL (ref 0.61–1.24)
GFR, Estimated: >60 mL/min (ref >60)
Glucose, Bld: 113 mg/dL — ABNORMAL HIGH (ref 70–99)
Potassium: 3.9 mmol/L (ref 3.5–5.1)
Sodium: 138 mmol/L (ref 135–145)

## 2021-01-12 MED ORDER — SODIUM CHLORIDE 0.9 % IV BOLUS
1000.0000 mL | Freq: Once | INTRAVENOUS | Status: AC
  Administered 2021-01-12: 1000 mL via INTRAVENOUS

## 2021-01-12 NOTE — ED Triage Notes (Signed)
PER EMS: pt arrives from [facility] with c/o unwitnessed fall from bed earlier today at 1300. + blood thinners, Eliquis. Nursing home staff unsure how long he was on the floor but the patient did not complain of any injuries. His daughter came to visit and found out he fell down and demanded he be taken to the ER for a full body scan. He reports right sided thoracic pain and tenderness. Hx of dementia but is A&Ox4.  BP-152/90, HR-68, O2-96%, RR-18

## 2021-01-12 NOTE — ED Provider Notes (Signed)
[facility] EMERGENCY DEPARTMENT Provider Note   CSN: [id number] Arrival date & time: 01/12/21  2031     History Chief Complaint  Patient presents with   Corey Cummings is a 87 y.o. male with history of protein calorie malnutrition, hypertension, complicated UTIs, chronic indwelling Foley, hypernatremia, failure to thrive, pulmonary embolism, on Eliquis, presenting from [facility] with concern for an unwitnessed fall today.  Patient was found on the ground around o'clock today.  It is not clear how he may have fallen.  Patient is a dentulous and minimally verbal at baseline, and his daughter who is present at bedside reports that he has had episodes of waxing and waning confusion increasingly often while at the nursing facility.  The patient is awake, he has no pain.  He denies pain in his hips.  He denies headache.  His daughter is concerned that he has had some electrolyte derangements including sodium issues on the past several checks.  She wonders about a possible UTI as well.  She is concerned about his weakness and failure to thrive.  She believes he is at high risk for aspiration, and says that she saw him choking on food the other day.  He did have an MRI of the brain last month for his waxing and waning confusion, which showed no acute infarct, was motion limited, but did noted some moderate atrophy and chronic microvascular ischemic disease.  HPI     Past Medical History:  Diagnosis Date   BPH (benign prostatic hyperplasia)    Hypertension     Patient Active Problem List   Diagnosis Date Noted   Hydronephrosis of right kidney 01/02/2021   Complicated UTI (urinary tract infection) 01/02/2021   Hypernatremia 01/02/2021   Essential hypertension 01/02/2021   Protein-calorie malnutrition, severe 12/29/2020   Pressure injury of skin 12/24/2020   Acute metabolic encephalopathy 12/14/2020   Hypokalemia 12/14/2020   History of DVT (deep vein  thrombosis) 12/14/2020   History of pulmonary embolus (PE) 12/14/2020   Severe sepsis (HCC) 12/04/2020   Lactic acidosis 12/04/2020   Acute cystitis without hematuria 12/04/2020   Foley catheter problem (HCC) 12/04/2020   Unilateral recurrent inguinal hernia without obstruction or gangrene 12/04/2020   Acute pyelonephritis 12/04/2020   BPH (benign prostatic hyperplasia) 12/04/2020   Chronic indwelling Foley catheter 12/04/2020   Hypertensive urgency 12/04/2020   CKD (chronic kidney disease), stage III (HCC) 12/04/2020   Acute pulmonary embolism (HCC) 02/24/2020   Ureteral obstruction, right 02/24/2020   Urinary tract infection associated with catheterization of urinary tract, initial encounter (HCC) 02/24/2020   Renal insufficiency 02/24/2020   Thoracic aortic aneurysm without rupture 02/24/2020   Bronchiectasis (HCC) 02/24/2020   Pulmonary embolism (HCC) 02/24/2020   Hydronephrosis with urinary obstruction due to ureteral calculus     Past Surgical History:  Procedure Laterality Date   APPENDECTOMY     CYSTOSCOPY W/ URETERAL STENT PLACEMENT Right 12/04/2020   Procedure: CYSTOSCOPY WITH RETROGRADE PYELOGRAM/URETERAL STENT PLACEMENT;  Surgeon: Jannifer Hick, MD;  Location: [facility];  Service: Urology;  Laterality: Right;   CYSTOSCOPY W/ URETERAL STENT PLACEMENT Right 01/03/2021   Procedure: CYSTOSCOPY LITHOTRIPSY WITH RETROGRADE PYELOGRAM/ STENT REMOVAL;  Surgeon: Noel Christmas, MD;  Location: [facility];  Service: Urology;  Laterality: Right;       No family history on file.  Social History   Tobacco Use   Smoking status: Former    Types: Cigarettes   Smokeless tobacco: Never  Tobacco comments:    Quit 40 years ago  Substance Use Topics   Alcohol use: Not Currently   Drug use: Not Currently    Home Medications Prior to Admission medications   Medication Sig Start Date End Date Taking? Authorizing Provider  acidophilus (RISAQUAD) CAPS capsule Take 1 capsule by mouth  daily. 12/27/20   Lanae Boast, MD  amLODipine (NORVASC) 5 MG tablet Take 1 tablet (5 mg total) by mouth daily. 12/07/20   Erick Blinks, MD  apixaban (ELIQUIS) 5 MG TABS tablet Take 5 mg by mouth 2 (two) times daily.    [provider]  ascorbic acid (VITAMIN C) 250 MG tablet Take 1 tablet (250 mg total) by mouth daily. 01/09/21   Narda Bonds, MD  feeding supplement (ENSURE ENLIVE / ENSURE PLUS) LIQD Take 237 mLs by mouth 2 (two) times daily between meals. 12/29/20   Lanae Boast, MD  finasteride (PROSCAR) 5 MG tablet Take 5 mg by mouth at bedtime. 01/12/20   [provider]  Iron-FA-B Cmp-C-Biot-Probiotic (FUSION PLUS) CAPS Take 1 capsule by mouth daily. 11/28/20   [provider]  Multiple Vitamin (MULTIVITAMIN WITH MINERALS) TABS tablet Take 1 tablet by mouth daily. 12/29/20   Lanae Boast, MD  tamsulosin (FLOMAX) 0.4 MG CAPS capsule Take 0.4 mg by mouth daily. 10/20/20   [provider]  thiamine 100 MG tablet Take 1 tablet (100 mg total) by mouth daily. 12/29/20 01/28/21  Lanae Boast, MD  zinc sulfate 220 (50 Zn) MG capsule Take 1 capsule (220 mg total) by mouth daily. 01/09/21   Narda Bonds, MD    Allergies    Patient has no known allergies.  Review of Systems   Review of Systems  Unable to perform ROS: Dementia (level 5 caveat)   Physical Exam Updated Vital Signs BP 106/75   Pulse 80   Temp (!) 97.3 F (36.3 C) (Oral)   Resp 19   Ht 5\' 9"  (1.753 m)   Wt 56.7 kg   SpO2 98%   BMI 18.46 kg/m   Physical Exam Constitutional:      General: He is not in acute distress.    Comments: Thin, frail, chronically ill appearing  HENT:     Head: Normocephalic and atraumatic.     Comments: Edentulous Eyes:     Conjunctiva/sclera: Conjunctivae normal.     Pupils: Pupils are equal, round, and reactive to light.  Cardiovascular:     Rate and Rhythm: Normal rate and regular rhythm.  Pulmonary:     Effort: Pulmonary effort is normal. No respiratory  distress.  Abdominal:     General: There is no distension.     Tenderness: There is no abdominal tenderness.  Musculoskeletal:     Comments: Full ROM of the bilateral hips and lower extremities without pain or tenderness  Skin:    General: Skin is warm and dry.  Neurological:     General: No focal deficit present.     Mental Status: He is alert. Mental status is at baseline.    ED Results / Procedures / Treatments   Labs (all labs ordered are listed, but only abnormal results are displayed) Labs Reviewed  RESP PANEL BY RT-PCR (FLU A&B, COVID) ARPGX2  BASIC METABOLIC PANEL  CBC WITH DIFFERENTIAL/PLATELET  URINALYSIS, ROUTINE W REFLEX MICROSCOPIC    EKG None  Radiology No results found.  Procedures Procedures   Medications Ordered in ED Medications  sodium chloride 0.9 % bolus 1,000 mL (has no  administration in time range)    ED Course  I have reviewed the triage vital signs and the nursing notes.  Pertinent labs & imaging results that were available during my care of the patient were reviewed by me and considered in my medical decision making (see chart for details).  Pt here with fall, on eliquis Pending CTH and CT chest to evaluate for aspiration, per his daughter's report Labs also pending - will check Na, as he has had issues in the past with this.  Also UA pending (he has chronic foley catheter).  He appears chronically ill/malnourished, and overall appears to have a poor prognosis with rapid decline in physical function/PO status and mentation.  He is DNR per signed forms at the bedside.    Clinical Course as of 01/13/21 1114  Thu Jan 12, 2021  2333 Pt signed out to Dr Corey Cummings EDP pending completion of workup and CT imaging - if unremarkable can be discharged back to facility. [MT]    Clinical Course User Index [MT] Terika Pillard, Kermit Balo, MD    Final Clinical Impression(s) / ED Diagnoses Final diagnoses:  None    Rx / DC Orders ED Discharge Orders      None        Terald Sleeper, MD 01/13/21 1116

## 2021-01-12 NOTE — ED Provider Notes (Signed)
11:05 PM Assumed care from Dr. Renaye Rakers, please see their note for full history, physical and decision making until this point. In brief this is a 87 y.o. year old male who presented to the ED tonight with Fall     Unwitnessed fall. Pending CT scans and labs. If ok can be discharged.  CT/labs ok. No obvious injuries elsewhere. Stable for discharge.    Discharge instructions, including strict return precautions for new or worsening symptoms, given. Patient and/or family verbalized understanding and agreement with the plan as described.   Labs, studies and imaging reviewed by myself and considered in medical decision making if ordered. Imaging interpreted by radiology.  Labs Reviewed  RESP PANEL BY RT-PCR (FLU A&B, COVID) ARPGX2  BASIC METABOLIC PANEL  CBC WITH DIFFERENTIAL/PLATELET  URINALYSIS, ROUTINE W REFLEX MICROSCOPIC    CT HEAD WO CONTRAST ( )    (Results Pending)  CT Chest Wo Contrast    (Results Pending)    No follow-ups on file.    Marily Memos, MD 01/14/21 [phone]

## 2021-01-13 LAB — RESP PANEL BY RT-PCR (FLU A&B, COVID) ARPGX2
Influenza A by PCR: NEGATIVE
Influenza B by PCR: NEGATIVE
SARS Coronavirus 2 by RT PCR: NEGATIVE

## 2021-01-13 MED ORDER — AMOXICILLIN-POT CLAVULANATE 875-125 MG PO TABS: 1.0000 | ORAL_TABLET | Freq: Two times a day (BID) | ORAL | 0 refills | Status: DC

## 2021-01-13 MED ORDER — SODIUM CHLORIDE 0.9 % IV SOLN
3.0000 g | Freq: Once | INTRAVENOUS | Status: AC
  Administered 2021-01-13: 3 g via INTRAVENOUS
  Filled 2021-01-13: qty 8

## 2021-01-13 MED ORDER — LEVOFLOXACIN IN D5W 750 MG/150ML IV SOLN: 750.0000 mg | Freq: Once | INTRAVENOUS | Status: DC

## 2021-01-13 NOTE — ED Notes (Signed)
PTAR notified for pt transport 

## 2021-01-14 LAB — URINE CULTURE: Culture: NO GROWTH

## 2021-01-29 ENCOUNTER — Emergency Department (HOSPITAL_COMMUNITY): Payer: Medicare HMO

## 2021-01-29 ENCOUNTER — Inpatient Hospital Stay (HOSPITAL_COMMUNITY)
Admission: EM | Admit: 2021-01-29 | Discharge: 2021-02-09 | DRG: 682 | Disposition: A | Discharge status: Hospice | Payer: Medicare HMO | Attending: Internal Medicine | Admitting: Internal Medicine

## 2021-01-29 ENCOUNTER — Encounter (HOSPITAL_COMMUNITY): Payer: Self-pay

## 2021-01-29 ENCOUNTER — Other Ambulatory Visit: Payer: Self-pay

## 2021-01-29 DIAGNOSIS — Z86711 Personal history of pulmonary embolism: Secondary | ICD-10-CM

## 2021-01-29 DIAGNOSIS — L89153 Pressure ulcer of sacral region, stage 3: Secondary | ICD-10-CM | POA: Diagnosis present

## 2021-01-29 DIAGNOSIS — Z87442 Personal history of urinary calculi: Secondary | ICD-10-CM

## 2021-01-29 DIAGNOSIS — D62 Acute posthemorrhagic anemia: Secondary | ICD-10-CM | POA: Diagnosis present

## 2021-01-29 DIAGNOSIS — E875 Hyperkalemia: Secondary | ICD-10-CM | POA: Diagnosis present

## 2021-01-29 DIAGNOSIS — N136 Pyonephrosis: Secondary | ICD-10-CM | POA: Diagnosis not present

## 2021-01-29 DIAGNOSIS — Z87891 Personal history of nicotine dependence: Secondary | ICD-10-CM | POA: Diagnosis not present

## 2021-01-29 DIAGNOSIS — D509 Iron deficiency anemia, unspecified: Secondary | ICD-10-CM | POA: Diagnosis present

## 2021-01-29 DIAGNOSIS — W050XXA Fall from non-moving wheelchair, initial encounter: Secondary | ICD-10-CM | POA: Diagnosis present

## 2021-01-29 DIAGNOSIS — S51012A Laceration without foreign body of left elbow, initial encounter: Secondary | ICD-10-CM | POA: Diagnosis present

## 2021-01-29 DIAGNOSIS — R310 Gross hematuria: Secondary | ICD-10-CM | POA: Diagnosis not present

## 2021-01-29 DIAGNOSIS — N179 Acute kidney failure, unspecified: Principal | ICD-10-CM

## 2021-01-29 DIAGNOSIS — Z66 Do not resuscitate: Secondary | ICD-10-CM | POA: Diagnosis present

## 2021-01-29 DIAGNOSIS — I10 Essential (primary) hypertension: Secondary | ICD-10-CM | POA: Diagnosis present

## 2021-01-29 DIAGNOSIS — R319 Hematuria, unspecified: Secondary | ICD-10-CM | POA: Diagnosis not present

## 2021-01-29 DIAGNOSIS — Y846 Urinary catheterization as the cause of abnormal reaction of the patient, or of later complication, without mention of misadventure at the time of the procedure: Secondary | ICD-10-CM | POA: Diagnosis present

## 2021-01-29 DIAGNOSIS — Z7901 Long term (current) use of anticoagulants: Secondary | ICD-10-CM | POA: Diagnosis not present

## 2021-01-29 DIAGNOSIS — E861 Hypovolemia: Secondary | ICD-10-CM | POA: Diagnosis present

## 2021-01-29 DIAGNOSIS — Z79899 Other long term (current) drug therapy: Secondary | ICD-10-CM

## 2021-01-29 DIAGNOSIS — F05 Delirium due to known physiological condition: Secondary | ICD-10-CM | POA: Diagnosis present

## 2021-01-29 DIAGNOSIS — Z20822 Contact with and (suspected) exposure to covid-19: Secondary | ICD-10-CM | POA: Diagnosis present

## 2021-01-29 DIAGNOSIS — Z515 Encounter for palliative care: Secondary | ICD-10-CM

## 2021-01-29 DIAGNOSIS — Z86718 Personal history of other venous thrombosis and embolism: Secondary | ICD-10-CM

## 2021-01-29 DIAGNOSIS — G9341 Metabolic encephalopathy: Secondary | ICD-10-CM | POA: Diagnosis present

## 2021-01-29 DIAGNOSIS — Z781 Physical restraint status: Secondary | ICD-10-CM | POA: Diagnosis not present

## 2021-01-29 DIAGNOSIS — W19XXXA Unspecified fall, initial encounter: Secondary | ICD-10-CM | POA: Diagnosis not present

## 2021-01-29 DIAGNOSIS — T83091A Other mechanical complication of indwelling urethral catheter, initial encounter: Secondary | ICD-10-CM | POA: Diagnosis present

## 2021-01-29 DIAGNOSIS — Z978 Presence of other specified devices: Secondary | ICD-10-CM | POA: Diagnosis not present

## 2021-01-29 DIAGNOSIS — N400 Enlarged prostate without lower urinary tract symptoms: Secondary | ICD-10-CM | POA: Diagnosis present

## 2021-01-29 DIAGNOSIS — E860 Dehydration: Secondary | ICD-10-CM | POA: Diagnosis present

## 2021-01-29 DIAGNOSIS — Z7189 Other specified counseling: Secondary | ICD-10-CM | POA: Diagnosis not present

## 2021-01-29 DIAGNOSIS — N390 Urinary tract infection, site not specified: Secondary | ICD-10-CM | POA: Diagnosis not present

## 2021-01-29 DIAGNOSIS — R4182 Altered mental status, unspecified: Secondary | ICD-10-CM | POA: Diagnosis not present

## 2021-01-29 DIAGNOSIS — N029 Recurrent and persistent hematuria with unspecified morphologic changes: Secondary | ICD-10-CM | POA: Diagnosis present

## 2021-01-29 LAB — BASIC METABOLIC PANEL
Anion gap: 9 (ref 5–15)
Anion gap: 7 (ref 5–15)
BUN: 56 mg/dL — ABNORMAL HIGH (ref 8–23)
BUN: 51 mg/dL — ABNORMAL HIGH (ref 8–23)
CO2: 21 mmol/L — ABNORMAL LOW (ref 22–32)
CO2: 21 mmol/L — ABNORMAL LOW (ref 22–32)
Calcium: 9.1 mg/dL (ref 8.9–10.3)
Calcium: 8.3 mg/dL — ABNORMAL LOW (ref 8.9–10.3)
Chloride: 108 mmol/L (ref 98–111)
Chloride: 111 mmol/L (ref 98–111)
Creatinine, Ser: 2.43 mg/dL — ABNORMAL HIGH (ref 0.61–1.24)
Creatinine, Ser: 1.9 mg/dL — ABNORMAL HIGH (ref 0.61–1.24)
GFR, Estimated: 25 mL/min — ABNORMAL LOW (ref >60)
GFR, Estimated: 34 mL/min — ABNORMAL LOW (ref >60)
Glucose, Bld: 161 mg/dL — ABNORMAL HIGH (ref 70–99)
Glucose, Bld: 113 mg/dL — ABNORMAL HIGH (ref 70–99)
Potassium: 5.9 mmol/L — ABNORMAL HIGH (ref 3.5–5.1)
Potassium: 4.6 mmol/L (ref 3.5–5.1)
Sodium: 138 mmol/L (ref 135–145)
Sodium: 139 mmol/L (ref 135–145)

## 2021-01-29 LAB — URINALYSIS, ROUTINE W REFLEX MICROSCOPIC

## 2021-01-29 LAB — CBC
HCT: 30 % — ABNORMAL LOW (ref 39.0–52.0)
Hemoglobin: 9.2 g/dL — ABNORMAL LOW (ref 13.0–17.0)
MCH: 26.7 pg (ref 26.0–34.0)
MCHC: 30.7 g/dL (ref 30.0–36.0)
MCV: 87 fL (ref 80.0–100.0)
Platelets: 314 10*3/uL (ref 150–400)
RBC: 3.45 MIL/uL — ABNORMAL LOW (ref 4.22–5.81)
RDW: 18.4 % — ABNORMAL HIGH (ref 11.5–15.5)
WBC: 13.7 10*3/uL — ABNORMAL HIGH (ref 4.0–10.5)
nRBC: 0 % (ref 0.0–0.2)

## 2021-01-29 LAB — HEMOGLOBIN AND HEMATOCRIT, BLOOD
HCT: 26.7 % — ABNORMAL LOW (ref 39.0–52.0)
Hemoglobin: 8 g/dL — ABNORMAL LOW (ref 13.0–17.0)

## 2021-01-29 LAB — URINALYSIS, MICROSCOPIC (REFLEX)
RBC / HPF: >50 RBC/hpf (ref 0–5)
Squamous Epithelial / HPF: NONE SEEN (ref 0–5)
WBC, UA: >50 WBC/hpf (ref 0–5)

## 2021-01-29 LAB — RESP PANEL BY RT-PCR (FLU A&B, COVID) ARPGX2
Influenza A by PCR: NEGATIVE
Influenza B by PCR: NEGATIVE
SARS Coronavirus 2 by RT PCR: NEGATIVE

## 2021-01-29 LAB — PROTIME-INR
INR: 1.3 — ABNORMAL HIGH (ref 0.8–1.2)
Prothrombin Time: 16.2 seconds — ABNORMAL HIGH (ref 11.4–15.2)

## 2021-01-29 MED ORDER — RISAQUAD PO CAPS: 1.0000 | ORAL_CAPSULE | Freq: Every day | ORAL | Status: DC

## 2021-01-29 MED ORDER — ONDANSETRON HCL 4 MG PO TABS: 4.0000 mg | ORAL_TABLET | Freq: Four times a day (QID) | ORAL | Status: DC | PRN

## 2021-01-29 MED ORDER — FINASTERIDE 5 MG PO TABS: 5.0000 mg | ORAL_TABLET | Freq: Every day | ORAL | Status: DC

## 2021-01-29 MED ORDER — SODIUM ZIRCONIUM CYCLOSILICATE 10 G PO PACK: 10.0000 g | PACK | Freq: Once | ORAL | Status: DC

## 2021-01-29 MED ORDER — FUSION PLUS PO CAPS: 1.0000 | ORAL_CAPSULE | Freq: Every day | ORAL | Status: DC

## 2021-01-29 MED ORDER — FINASTERIDE 5 MG PO TABS
5.0000 mg | ORAL_TABLET | Freq: Every day | ORAL | Status: DC
  Administered 2021-01-31 – 2021-02-07 (×8): 5 mg via ORAL
  Filled 2021-01-29 (×10): qty 1

## 2021-01-29 MED ORDER — HYDRALAZINE HCL 25 MG PO TABS: 25.0000 mg | ORAL_TABLET | Freq: Four times a day (QID) | ORAL | Status: DC | PRN

## 2021-01-29 MED ORDER — TAMSULOSIN HCL 0.4 MG PO CAPS
0.4000 mg | ORAL_CAPSULE | Freq: Every day | ORAL | Status: DC
  Administered 2021-01-31 – 2021-02-09 (×10): 0.4 mg via ORAL
  Filled 2021-01-29 (×11): qty 1

## 2021-01-29 MED ORDER — ONDANSETRON HCL 4 MG/2ML IJ SOLN: 4.0000 mg | Freq: Four times a day (QID) | INTRAMUSCULAR | Status: DC | PRN

## 2021-01-29 MED ORDER — SODIUM CHLORIDE 0.45 % IV SOLN
INTRAVENOUS | Status: DC
  Filled 2021-01-29 (×2): qty 75

## 2021-01-29 MED ORDER — SODIUM ZIRCONIUM CYCLOSILICATE 10 G PO PACK
10.0000 g | PACK | Freq: Once | ORAL | Status: AC
  Administered 2021-01-29: 10 g via ORAL
  Filled 2021-01-29: qty 1

## 2021-01-29 MED ORDER — HYDRALAZINE HCL 20 MG/ML IJ SOLN
5.0000 mg | Freq: Four times a day (QID) | INTRAMUSCULAR | Status: DC | PRN
  Filled 2021-01-29: qty 1

## 2021-01-29 MED ORDER — ENSURE ENLIVE PO LIQD
237.0000 mL | Freq: Two times a day (BID) | ORAL | Status: DC
  Administered 2021-01-29 – 2021-01-31 (×2): 237 mL via ORAL
  Filled 2021-01-29: qty 237

## 2021-01-29 MED ORDER — SODIUM CHLORIDE 0.9 % IV BOLUS
1000.0000 mL | Freq: Once | INTRAVENOUS | Status: AC
  Administered 2021-01-29: 1000 mL via INTRAVENOUS

## 2021-01-29 MED ORDER — TAMSULOSIN HCL 0.4 MG PO CAPS: 0.4000 mg | ORAL_CAPSULE | Freq: Every day | ORAL | Status: DC

## 2021-01-29 NOTE — ED Notes (Signed)
Irrigated foley cath , clear return noted

## 2021-01-29 NOTE — ED Notes (Signed)
Pt back to room from CT

## 2021-01-29 NOTE — ED Notes (Signed)
Irrigated catheter with 150cc, clear return noted

## 2021-01-29 NOTE — ED Notes (Signed)
More blood return noted in catheter so an additional 300cc used to irrigate catheter

## 2021-01-29 NOTE — ED Notes (Signed)
Patient transported to X-ray 

## 2021-01-29 NOTE — Progress Notes (Signed)
Repeat H&H dropped 9.2>8.0.  Urology started bladder irrigation, with additional clot being washed out. Will hold anticoagulation tonight.

## 2021-01-29 NOTE — Progress Notes (Signed)
   01/29/21 0831  Clinical Encounter Type  Visited With Patient not available  Visit Type Initial  Referral From Nurse  Consult/Referral To Chaplain   Chaplain responded to Level 2 trauma. Pt being treated and no support person present. No current spiritual care needs. Chaplain remains available.  This note was prepared by Paul Half, MDiv. Chaplain remains available as needed through the on-call pager: [phone].

## 2021-01-29 NOTE — Progress Notes (Signed)
Orthopedic Tech Progress Note Patient Details:  Corey Cummings 1930/08/11 [id number]  Patient ID: [name], male   DOB: 02/24/1931, 87 y.o.   MRN: [id number] Level 2  not needed.  Artis Flock Lancer Thurner 01/29/2021, 8:51 AM

## 2021-01-29 NOTE — ED Triage Notes (Signed)
Pt BIB [facility] from [facility] facility as Level 2- Fall on Thinners (Eliquis). EMS reports pt had a unwitnessed fall & c/o HA, back & neck pain. C-collar in place, small skin tear to Left elbow, Dark blood in urine present in indwelling foley that was present upon arrival. EMS reports SBP 120 palpated, 105 bpm, CBG 102, wheel chair bound as baseline, A/Ox4, verbal- able to make needs known.

## 2021-01-29 NOTE — Consult Note (Signed)
Urology Consult   Physician requesting consult: Dr. Margarita Grizzle  Reason for consult: Hematuria  History of Present Illness: Corey Cummings is a 87 y.o. followed by Dr. Cardell Peach for hematuria, chronic urinary retention, and urolithiasis.  He underwent right ureteral stenting for a large right ureteral stone that had eventually migrated in to the bladder.  He is s/p cystolithalopaxy and right ureteral stent removal in late September.   According to his step daughter, he had developed recurrent gross hematuria on Tuesday of this week.  He had fallen multiple times but had a more concerning fall today that caused him to be transported to the ED from his SNF.  His Cr was elevated at 2.4 (baseline 0.9) and his catheter was not draining well  His catheter was exchanged for a new 16 Fr catheter and over 1 L of bloody urine returned.  He is to be admitted by [facility] and urology has been asked to see him for management of his hematuria.  He is on Eliquis for a history of DVT/PE.  He apparently has also been frequently pulling on his catheter.  CT abdomen and pelvis is pending.   Past Medical History:  Diagnosis Date   BPH (benign prostatic hyperplasia)    Hypertension     Past Surgical History:  Procedure Laterality Date   APPENDECTOMY     CYSTOSCOPY W/ URETERAL STENT PLACEMENT Right 12/04/2020   Procedure: CYSTOSCOPY WITH RETROGRADE PYELOGRAM/URETERAL STENT PLACEMENT;  Surgeon: Jannifer Hick, MD;  Location: [facility];  Service: Urology;  Laterality: Right;   CYSTOSCOPY W/ URETERAL STENT PLACEMENT Right 01/03/2021   Procedure: CYSTOSCOPY LITHOTRIPSY WITH RETROGRADE PYELOGRAM/ STENT REMOVAL;  Surgeon: Noel Christmas, MD;  Location: [facility];  Service: Urology;  Laterality: Right;    Medications:  Home meds:  No current facility-administered medications on file prior to encounter.   Current Outpatient Medications on File Prior to Encounter  Medication Sig Dispense Refill   acidophilus (RISAQUAD) CAPS  capsule Take 1 capsule by mouth daily.     amLODipine (NORVASC) 5 MG tablet Take 1 tablet (5 mg total) by mouth daily. 30 tablet 1   amoxicillin-clavulanate (AUGMENTIN) 875-125 MG tablet Take 1 tablet by mouth 2 (two) times daily. One po bid x 7 days 14 tablet 0   apixaban (ELIQUIS) 5 MG TABS tablet Take 5 mg by mouth 2 (two) times daily.     ascorbic acid (VITAMIN C) 250 MG tablet Take 1 tablet (250 mg total) by mouth daily.     feeding supplement (ENSURE ENLIVE / ENSURE PLUS) LIQD Take 237 mLs by mouth 2 (two) times daily between meals. 237 mL 12   finasteride (PROSCAR) 5 MG tablet Take 5 mg by mouth at bedtime.     Iron-FA-B Cmp-C-Biot-Probiotic (FUSION PLUS) CAPS Take 1 capsule by mouth daily.     Multiple Vitamin (MULTIVITAMIN WITH MINERALS) TABS tablet Take 1 tablet by mouth daily.     tamsulosin (FLOMAX) 0.4 MG CAPS capsule Take 0.4 mg by mouth daily.     zinc sulfate 220 (50 Zn) MG capsule Take 1 capsule (220 mg total) by mouth daily.       Scheduled Meds:  acidophilus  1 capsule Oral Daily   feeding supplement  237 mL Oral BID BM   finasteride  5 mg Oral QHS   Fusion Plus  1 capsule Oral Daily   sodium zirconium cyclosilicate  10 g Oral Once   tamsulosin  0.4 mg Oral Daily   Continuous Infusions:  sodium bicarbonate in 0.45 NS mL infusion     PRN Meds:.hydrALAZINE, ondansetron **OR** ondansetron (ZOFRAN) IV  Allergies: No Known Allergies  History reviewed. No pertinent family history.  Social History:  reports that he has quit smoking. His smoking use included cigarettes. He has never used smokeless tobacco. He reports that he does not currently use alcohol. He reports that he does not currently use drugs.  ROS: A complete review of systems was performed.  All systems are negative except for pertinent findings as noted.  Physical Exam:  Vital signs in last 24 hours: Temp:  [98 F (36.7 C)-98.4 F (36.9 C)] 98 F (36.7 C) (10/23 1153) Pulse Rate:  [42-99] 91 (10/23  1315) Resp:  [14-22] 20 (10/23 1315) BP: (103-130)/(65-100) 129/68 (10/23 1315) SpO2:  [90 %-100 %] 100 % (10/23 1315) Weight:  [56.7 kg] 56.7 kg (10/23 0901) Constitutional:  Alert and oriented, No acute distress Cardiovascular: Regular rate and rhythm, No JVD Respiratory: Normal respiratory effort GI: Abdomen is soft, nontender, nondistended, no abdominal masses Genitourinary: No CVAT. Normal male phallus, testes are descended bilaterally and non-tender and without masses, scrotum is normal in appearance without lesions or masses, perineum is normal on inspection. Indwelling 16 Fr Foley with old appearing bloody urine. Lymphatic: No lymphadenopathy Neurologic: Grossly intact, no focal deficits Psychiatric: Normal mood and affect  Laboratory Data:  Recent Labs    01/29/21 0900  WBC 13.7*  HGB 9.2*  HCT 30.0*  PLT 314    Recent Labs    01/29/21 0900  NA 138  K 5.9*  CL 108  GLUCOSE 161*  BUN 56*  CALCIUM 9.1  CREATININE 2.43*     Results for orders placed or performed during the hospital encounter of 01/29/21 (from the past 24 hour(s))  CBC     Status: Abnormal   Collection Time: 01/29/21  9:00 AM  Result Value Ref Range   WBC 13.7 (H) 4.0 - 10.5 K/uL   RBC 3.45 (L) 4.22 - 5.81 MIL/uL   Hemoglobin 9.2 (L) 13.0 - 17.0 g/dL   HCT 24.4 (L) 01.0 - 27.2 %   MCV 87.0 80.0 - 100.0 fL   MCH 26.7 26.0 - 34.0 pg   MCHC 30.7 30.0 - 36.0 g/dL   RDW 53.6 (H) 64.4 - 03.4 %   Platelets 314 150 - 400 K/uL   nRBC 0.0 0.0 - 0.2 %  Basic metabolic panel     Status: Abnormal   Collection Time: 01/29/21  9:00 AM  Result Value Ref Range   Sodium 138 135 - 145 mmol/L   Potassium 5.9 (H) 3.5 - 5.1 mmol/L   Chloride 108 98 - 111 mmol/L   CO2 21 (L) 22 - 32 mmol/L   Glucose, Bld 161 (H) 70 - 99 mg/dL   BUN 56 (H) 8 - 23 mg/dL   Creatinine, Ser 7.42 (H) 0.61 - 1.24 mg/dL   Calcium 9.1 8.9 - 59.5 mg/dL   GFR, Estimated 25 (L) >60 mL/min   Anion gap 9 5 - 15  Protime-INR      Status: Abnormal   Collection Time: 01/29/21  9:00 AM  Result Value Ref Range   Prothrombin Time 16.2 (H) 11.4 - 15.2 seconds   INR 1.3 (H) 0.8 - 1.2  Urinalysis, Routine w reflex microscopic Urine, Catheterized     Status: Abnormal   Collection Time: 01/29/21 11:50 AM  Result Value Ref Range   Color, Urine RED (A) YELLOW   APPearance TURBID (A) CLEAR  Specific Gravity, Urine  1.005 - 1.030    TEST NOT REPORTED DUE TO COLOR INTERFERENCE OF URINE PIGMENT   pH  5.0 - 8.0    TEST NOT REPORTED DUE TO COLOR INTERFERENCE OF URINE PIGMENT   Glucose, UA (A) NEGATIVE mg/dL    TEST NOT REPORTED DUE TO COLOR INTERFERENCE OF URINE PIGMENT   Hgb urine dipstick (A) NEGATIVE    TEST NOT REPORTED DUE TO COLOR INTERFERENCE OF URINE PIGMENT   Bilirubin Urine (A) NEGATIVE    TEST NOT REPORTED DUE TO COLOR INTERFERENCE OF URINE PIGMENT   Ketones, ur (A) NEGATIVE mg/dL    TEST NOT REPORTED DUE TO COLOR INTERFERENCE OF URINE PIGMENT   Protein, ur (A) NEGATIVE mg/dL    TEST NOT REPORTED DUE TO COLOR INTERFERENCE OF URINE PIGMENT   Nitrite (A) NEGATIVE    TEST NOT REPORTED DUE TO COLOR INTERFERENCE OF URINE PIGMENT   Leukocytes,Ua (A) NEGATIVE    TEST NOT REPORTED DUE TO COLOR INTERFERENCE OF URINE PIGMENT  Urinalysis, Microscopic (reflex)     Status: Abnormal   Collection Time: 01/29/21 11:50 AM  Result Value Ref Range   RBC / HPF >50 0 - 5 RBC/hpf   WBC, UA >50 0 - 5 WBC/hpf   Bacteria, UA FEW (A) NONE SEEN   Squamous Epithelial / LPF NONE SEEN 0 - 5   No results found for this or any previous visit (from the past 240 hour(s)).  Renal Function: Recent Labs    01/29/21 0900  CREATININE 2.43*   Estimated Creatinine Clearance: 16.9 mL/min (A) (by C-G formula based on SCr of 2.43 mg/dL (H)).  Radiologic Imaging: DG Elbow Complete Left  Result Date: 01/29/2021 CLINICAL DATA:  87 year old male status post fall on Eliquis. EXAM: LEFT ELBOW - COMPLETE 3+ VIEW COMPARISON:  None. FINDINGS:  Incidental antecubital fossa IV artifact. Chronic joint space loss, degenerative spurring and chronic Medial condyle fragmentation about the elbow. No joint effusion, acute fracture or dislocation identified. IMPRESSION: Degenerative changes.  No acute fracture or dislocation identified. Electronically Signed   By: Odessa Fleming M.D.   On: 01/29/2021 11:19   CT Head Wo Contrast  Result Date: 01/29/2021 CLINICAL DATA:  87 year old male status post fall. EXAM: CT HEAD WITHOUT CONTRAST TECHNIQUE: Contiguous axial images were obtained from the base of the skull through the vertex without intravenous contrast. COMPARISON:  Brain MRI 01/05/2021.  Recent head CT 01/12/2021. FINDINGS: Brain: Motion artifact at the skull base. Stable cerebral volume. Stable gray-white matter differentiation throughout the brain. No midline shift, ventriculomegaly, mass effect, evidence of mass lesion, intracranial hemorrhage or evidence of cortically based acute infarction. Vascular: Calcified atherosclerosis at the skull base. Skull: Motion artifact at the level of the anterior cranial fossa. No acute osseous abnormality identified. Cervical spine is detailed separately. Sinuses/Orbits: Visualized paranasal sinuses and mastoids are clear. Other: No orbit or scalp soft tissue injury identified. IMPRESSION: 1. Mildly degraded by motion. No acute traumatic injury identified. Stable non contrast CT appearance of the brain. 2. Cervical Spine CT is detailed separately. Electronically Signed   By: Odessa Fleming M.D.   On: 01/29/2021 09:20   CT Cervical Spine Wo Contrast  Result Date: 01/29/2021 CLINICAL DATA:  87 year old male status post fall. EXAM: CT CERVICAL SPINE WITHOUT CONTRAST TECHNIQUE: Multidetector CT imaging of the cervical spine was performed without intravenous contrast. Multiplanar CT image reconstructions were also generated. COMPARISON:  12/14/2020. FINDINGS: Alignment: Chronic straightening of cervical lordosis. Mild reversal  today. But subtle  anterolisthesis of C2 on C3 and C7 on T1 appears stable. Stable posterior element alignment. Skull base and vertebrae: Visualized skull base is intact. No atlanto-occipital dissociation. C1 and C2 appear intact and aligned. No acute osseous abnormality identified. Soft tissues and spinal canal: No prevertebral fluid or swelling. No visible canal hematoma. Stable visible noncontrast neck soft tissues. Disc levels: Chronically advanced cervical spine degeneration appears stable since September. Advanced chronic facet arthropathy on the right at C2-C3. Chronic degenerative spinal stenosis at C2-C3 and C3-C4.Evidence of developing facet ankylosis on the left at C7-T1. Upper chest: Visible upper thoracic levels appear intact. Negative visible lung apices. IMPRESSION: 1. No acute traumatic injury identified in the cervical spine. 2. Chronically advanced cervical spine degeneration appears stable since September including chronic spinal stenosis at both C2-C3 and C3-C4. Electronically Signed   By: Odessa Fleming M.D.   On: 01/29/2021 09:25   DG Chest Portable 1 View  Result Date: 01/29/2021 CLINICAL DATA:  fall on thinners EXAM: PORTABLE CHEST 1 VIEW COMPARISON:  January 08, 2021 FINDINGS: The cardiomediastinal silhouette is unchanged in contour.Tortuous thoracic aorta. Atherosclerotic calcifications. No pleural effusion. No pneumothorax. No acute pleuroparenchymal abnormality. Visualized abdomen is unremarkable. Known L1 compression fracture is not well visualized with AP technique. IMPRESSION: No acute cardiopulmonary abnormality. Electronically Signed   By: Meda Klinefelter M.D.   On: 01/29/2021 09:49   DG HIP UNILAT WITH PELVIS 2-3 VIEWS LEFT  Result Date: 01/29/2021 CLINICAL DATA:  87 year old male status post fall on Eliquis. EXAM: DG HIP (WITH OR WITHOUT PELVIS) 2-3V RIGHT; DG HIP (WITH OR WITHOUT PELVIS) 2-3V LEFT COMPARISON:  CT Abdomen and Pelvis 01/02/2021. FINDINGS: A catheter projects  just to the right of the pelvic midline at the pubic rami, probably a urethral catheter. Femoral heads normally located. Pelvis appears intact. Bone mineralization is within normal limits for age. Proximal right femur appears intact. Proximal left femur appears intact. Symmetric SI joints. No acute osseous abnormality identified. Negative visible bowel gas. IMPRESSION: No acute fracture or dislocation identified about the bilateral hips or pelvis. Electronically Signed   By: Odessa Fleming M.D.   On: 01/29/2021 11:20   DG HIP UNILAT WITH PELVIS 2-3 VIEWS RIGHT  Result Date: 01/29/2021 CLINICAL DATA:  87 year old male status post fall on Eliquis. EXAM: DG HIP (WITH OR WITHOUT PELVIS) 2-3V RIGHT; DG HIP (WITH OR WITHOUT PELVIS) 2-3V LEFT COMPARISON:  CT Abdomen and Pelvis 01/02/2021. FINDINGS: A catheter projects just to the right of the pelvic midline at the pubic rami, probably a urethral catheter. Femoral heads normally located. Pelvis appears intact. Bone mineralization is within normal limits for age. Proximal right femur appears intact. Proximal left femur appears intact. Symmetric SI joints. No acute osseous abnormality identified. Negative visible bowel gas. IMPRESSION: No acute fracture or dislocation identified about the bilateral hips or pelvis. Electronically Signed   By: Odessa Fleming M.D.   On: 01/29/2021 11:20    I independently reviewed the above imaging studies.  Procedure: I irrigated his current catheter which did not irrigate very well.  I therefore removed this catheter.  Under sterile conditions, I placed a new 20 Fr catheter and irrigated it with saline.  No clots were noted but after irrigation, about 750 cc of urine returned.  This was mostly darker red consistent with old blood.  After irrigation, his catheter is draining well and is mostly urine with less blood.  Impression/Recommendation Gross hematuria: Likely multifactorial etiology due to very large prostate, anticoagulation, and  catheter trauma.  Catheter changed/upsized.  Irrigate prn.  Would consider temporarily stopping anticoagulation.    Addendum: CT scan reviewed: There is bilateral hydroureteronephrosis.  Right sided is markedly more severe due to chronic changes as previously noted.  Although there are small stone fragments in the renal collecting systems and ureter, there are none that are obstructing.  Findings are consistent with chronic changes with probable additional dilation due to urinary retention.  I expect his renal function to improve with proper bladder drainage.  Follow renal function.  No indication for further urologic intervention at this time.  If renal function returns to baseline, would not recommend ureteral stenting, etc.  Crecencio Mc 01/29/2021, 1:55 PM    Moody Bruins MD  CC: Dr. Margarita Grizzle

## 2021-01-29 NOTE — H&P (Signed)
History and Physical    Corey Cummings AJO:[id number] DOB: 05/09/1930 DOA: 01/29/2021  PCP: [name], MD (Confirm with patient/family/NH records and if not entered, this has to be entered at [facility] point of entry) Patient coming from: SNF  I have personally briefly reviewed patient's old medical records in [facility]  Chief Complaint: No complaints, patient confused.  HPI: Corey Cummings is a 87 y.o. male with medical history significant of BPH with chronic indwelling Foley catheter, obstructive renal stone status post stenting, HTN, PE November 21 on Eliquis, chronic iron deficiency anemia on iron supplement, was sent from nursing home for altered mentation and fall.  Patient confused unable to provide any history, most history provided by patient stepdaughter at bedside.  Stepdaughter reported patient started to have hematuria since Tuesday, was rather light-colored beginning, then became darker over the last 2 days, and the patient started to have poor oral intake since yesterday, complaining about lower abdominal pain and back pain, no fever or chills.  Feeling weaker this morning, patient had a mechanical fall.  No loss of consciousness, no head injury.  And nursing home called EMS.  ED Course: Afebrile, vital signs stable, no hypotension or tachycardia.  So we collected gross hematuria, Foley was exchanged at ED which drained more than 1000 of gross hematuria.  Blood work showed AKI with creatinine 2.4 compared to baseline 0.9, a new hyperkalemia K= 5.9, no ST-T changes on EKG.  Trauma scan negative.  Urology was contacted, who ordered a CAT scan and will see the patient emergently this afternoon.  Review of Systems: As per HPI otherwise 14 point review of systems negative.    Past Medical History:  Diagnosis Date   BPH (benign prostatic hyperplasia)    Hypertension     Past Surgical History:  Procedure Laterality Date   APPENDECTOMY     CYSTOSCOPY W/ URETERAL STENT  PLACEMENT Right 12/04/2020   Procedure: CYSTOSCOPY WITH RETROGRADE PYELOGRAM/URETERAL STENT PLACEMENT;  Surgeon: Jannifer Hick, MD;  Location: [facility];  Service: Urology;  Laterality: Right;   CYSTOSCOPY W/ URETERAL STENT PLACEMENT Right 01/03/2021   Procedure: CYSTOSCOPY LITHOTRIPSY WITH RETROGRADE PYELOGRAM/ STENT REMOVAL;  Surgeon: Noel Christmas, MD;  Location: [facility];  Service: Urology;  Laterality: Right;     reports that he has quit smoking. His smoking use included cigarettes. He has never used smokeless tobacco. He reports that he does not currently use alcohol. He reports that he does not currently use drugs.  No Known Allergies  History reviewed. No pertinent family history.   Prior to Admission medications   Medication Sig Start Date End Date Taking? Authorizing Provider  acidophilus (RISAQUAD) CAPS capsule Take 1 capsule by mouth daily. 12/27/20   Lanae Boast, MD  amLODipine (NORVASC) 5 MG tablet Take 1 tablet (5 mg total) by mouth daily. 12/07/20   Erick Blinks, MD  amoxicillin-clavulanate (AUGMENTIN) 875-125 MG tablet Take 1 tablet by mouth 2 (two) times daily. One po bid x 7 days 01/13/21   Mesner, Barbara Cower, MD  apixaban (ELIQUIS) 5 MG TABS tablet Take 5 mg by mouth 2 (two) times daily.    [provider]  ascorbic acid (VITAMIN C) 250 MG tablet Take 1 tablet (250 mg total) by mouth daily. 01/09/21   Narda Bonds, MD  feeding supplement (ENSURE ENLIVE / ENSURE PLUS) LIQD Take 237 mLs by mouth 2 (two) times daily between meals. 12/29/20   Lanae Boast, MD  finasteride (PROSCAR) 5 MG tablet Take 5 mg  by mouth at bedtime. 01/12/20   [provider]  Iron-FA-B Cmp-C-Biot-Probiotic (FUSION PLUS) CAPS Take 1 capsule by mouth daily. 11/28/20   [provider]  Multiple Vitamin (MULTIVITAMIN WITH MINERALS) TABS tablet Take 1 tablet by mouth daily. 12/29/20   Lanae Boast, MD  tamsulosin (FLOMAX) 0.4 MG CAPS capsule Take 0.4 mg by mouth daily. 10/20/20   [provider]  zinc sulfate 220 (50 Zn) MG capsule Take 1 capsule (220 mg total) by mouth daily. 01/09/21   Narda Bonds, MD    Physical Exam: Vitals:   01/29/21 1153 01/29/21 1215 01/29/21 1245 01/29/21 1315  BP:  104/65 113/87 129/68  Pulse:  97 87 91  Resp:  18 17 20   Temp: 98 F (36.7 C)     TempSrc: Rectal     SpO2:  90% 100% 100%  Weight:      Height:        Constitutional: NAD, calm, comfortable Vitals:   01/29/21 1153 01/29/21 1215 01/29/21 1245 01/29/21 1315  BP:  104/65 113/87 129/68  Pulse:  97 87 91  Resp:  18 17 20   Temp: 98 F (36.7 C)     TempSrc: Rectal     SpO2:  90% 100% 100%  Weight:      Height:       Eyes: PERRL, lids and conjunctivae normal ENMT: Mucous membranes are dry. Posterior pharynx clear of any exudate or lesions.Normal dentition.  Neck: normal, supple, no masses, no thyromegaly Respiratory: clear to auscultation bilaterally, no wheezing, no crackles. Normal respiratory effort. No accessory muscle use.  Cardiovascular: Regular rate and rhythm, no murmurs / rubs / gallops. No extremity edema. 2+ pedal pulses. No carotid bruits.  Abdomen: no tenderness, no masses palpated. No hepatosplenomegaly. Bowel sounds positive.  Musculoskeletal: no clubbing / cyanosis. No joint deformity upper and lower extremities. Good ROM, no contractures. Normal muscle tone.  Skin: no rashes, lesions, ulcers. No induration Neurologic: No facial droops, moving all limbs, following simple commands.  Psychiatric: Arousable, lethargic.    Labs on Admission: I have personally reviewed following labs and imaging studies  CBC: Recent Labs  Lab 01/29/21 0900  WBC 13.7*  HGB 9.2*  HCT 30.0*  MCV 87.0  PLT 314   Basic Metabolic Panel: Recent Labs  Lab 01/29/21 0900  NA 138  K 5.9*  CL 108  CO2 21*  GLUCOSE 161*  BUN 56*  CREATININE 2.43*  CALCIUM 9.1   GFR: Estimated Creatinine Clearance: 16.9 mL/min (A) (by C-G formula based on SCr of 2.43 mg/dL  (H)). Liver Function Tests: No results for input(s): AST, ALT, ALKPHOS, BILITOT, PROT, ALBUMIN in the last 168 hours. No results for input(s): LIPASE, AMYLASE in the last 168 hours. No results for input(s): AMMONIA in the last 168 hours. Coagulation Profile: Recent Labs  Lab 01/29/21 0900  INR 1.3*   Cardiac Enzymes: No results for input(s): CKTOTAL, CKMB, CKMBINDEX, TROPONINI in the last 168 hours. BNP (last 3 results) No results for input(s): PROBNP in the last 8760 hours. HbA1C: No results for input(s): HGBA1C in the last 72 hours. CBG: No results for input(s): GLUCAP in the last 168 hours. Lipid Profile: No results for input(s): CHOL, HDL, LDLCALC, TRIG, CHOLHDL, LDLDIRECT in the last 72 hours. Thyroid Function Tests: No results for input(s): TSH, T4TOTAL, FREET4, T3FREE, THYROIDAB in the last 72 hours. Anemia Panel: No results for input(s): VITAMINB12, FOLATE, FERRITIN, TIBC, IRON, RETICCTPCT in the last 72 hours. Urine analysis:  Component Value Date/Time   COLORURINE RED (A) 01/29/2021 1150   APPEARANCEUR TURBID (A) 01/29/2021 1150   LABSPEC  01/29/2021 1150    TEST NOT REPORTED DUE TO COLOR INTERFERENCE OF URINE PIGMENT   PHURINE  01/29/2021 1150    TEST NOT REPORTED DUE TO COLOR INTERFERENCE OF URINE PIGMENT   GLUCOSEU (A) 01/29/2021 1150    TEST NOT REPORTED DUE TO COLOR INTERFERENCE OF URINE PIGMENT   HGBUR (A) 01/29/2021 1150    TEST NOT REPORTED DUE TO COLOR INTERFERENCE OF URINE PIGMENT   BILIRUBINUR (A) 01/29/2021 1150    TEST NOT REPORTED DUE TO COLOR INTERFERENCE OF URINE PIGMENT   KETONESUR (A) 01/29/2021 1150    TEST NOT REPORTED DUE TO COLOR INTERFERENCE OF URINE PIGMENT   PROTEINUR (A) 01/29/2021 1150    TEST NOT REPORTED DUE TO COLOR INTERFERENCE OF URINE PIGMENT   NITRITE (A) 01/29/2021 1150    TEST NOT REPORTED DUE TO COLOR INTERFERENCE OF URINE PIGMENT   LEUKOCYTESUR (A) 01/29/2021 1150    TEST NOT REPORTED DUE TO COLOR INTERFERENCE OF URINE  PIGMENT    Radiological Exams on Admission: DG Elbow Complete Left  Result Date: 01/29/2021 CLINICAL DATA:  87 year old male status post fall on Eliquis. EXAM: LEFT ELBOW - COMPLETE 3+ VIEW COMPARISON:  None. FINDINGS: Incidental antecubital fossa IV artifact. Chronic joint space loss, degenerative spurring and chronic Medial condyle fragmentation about the elbow. No joint effusion, acute fracture or dislocation identified. IMPRESSION: Degenerative changes.  No acute fracture or dislocation identified. Electronically Signed   By: Odessa Fleming M.D.   On: 01/29/2021 11:19   CT Head Wo Contrast  Result Date: 01/29/2021 CLINICAL DATA:  87 year old male status post fall. EXAM: CT HEAD WITHOUT CONTRAST TECHNIQUE: Contiguous axial images were obtained from the base of the skull through the vertex without intravenous contrast. COMPARISON:  Brain MRI 01/05/2021.  Recent head CT 01/12/2021. FINDINGS: Brain: Motion artifact at the skull base. Stable cerebral volume. Stable gray-white matter differentiation throughout the brain. No midline shift, ventriculomegaly, mass effect, evidence of mass lesion, intracranial hemorrhage or evidence of cortically based acute infarction. Vascular: Calcified atherosclerosis at the skull base. Skull: Motion artifact at the level of the anterior cranial fossa. No acute osseous abnormality identified. Cervical spine is detailed separately. Sinuses/Orbits: Visualized paranasal sinuses and mastoids are clear. Other: No orbit or scalp soft tissue injury identified. IMPRESSION: 1. Mildly degraded by motion. No acute traumatic injury identified. Stable non contrast CT appearance of the brain. 2. Cervical Spine CT is detailed separately. Electronically Signed   By: Odessa Fleming M.D.   On: 01/29/2021 09:20   CT Cervical Spine Wo Contrast  Result Date: 01/29/2021 CLINICAL DATA:  87 year old male status post fall. EXAM: CT CERVICAL SPINE WITHOUT CONTRAST TECHNIQUE: Multidetector CT imaging of the  cervical spine was performed without intravenous contrast. Multiplanar CT image reconstructions were also generated. COMPARISON:  12/14/2020. FINDINGS: Alignment: Chronic straightening of cervical lordosis. Mild reversal today. But subtle anterolisthesis of C2 on C3 and C7 on T1 appears stable. Stable posterior element alignment. Skull base and vertebrae: Visualized skull base is intact. No atlanto-occipital dissociation. C1 and C2 appear intact and aligned. No acute osseous abnormality identified. Soft tissues and spinal canal: No prevertebral fluid or swelling. No visible canal hematoma. Stable visible noncontrast neck soft tissues. Disc levels: Chronically advanced cervical spine degeneration appears stable since September. Advanced chronic facet arthropathy on the right at C2-C3. Chronic degenerative spinal stenosis at C2-C3 and C3-C4.Evidence of developing facet  ankylosis on the left at C7-T1. Upper chest: Visible upper thoracic levels appear intact. Negative visible lung apices. IMPRESSION: 1. No acute traumatic injury identified in the cervical spine. 2. Chronically advanced cervical spine degeneration appears stable since September including chronic spinal stenosis at both C2-C3 and C3-C4. Electronically Signed   By: Odessa Fleming M.D.   On: 01/29/2021 09:25   DG Chest Portable 1 View  Result Date: 01/29/2021 CLINICAL DATA:  fall on thinners EXAM: PORTABLE CHEST 1 VIEW COMPARISON:  January 08, 2021 FINDINGS: The cardiomediastinal silhouette is unchanged in contour.Tortuous thoracic aorta. Atherosclerotic calcifications. No pleural effusion. No pneumothorax. No acute pleuroparenchymal abnormality. Visualized abdomen is unremarkable. Known L1 compression fracture is not well visualized with AP technique. IMPRESSION: No acute cardiopulmonary abnormality. Electronically Signed   By: Meda Klinefelter M.D.   On: 01/29/2021 09:49   DG HIP UNILAT WITH PELVIS 2-3 VIEWS LEFT  Result Date: 01/29/2021 CLINICAL  DATA:  87 year old male status post fall on Eliquis. EXAM: DG HIP (WITH OR WITHOUT PELVIS) 2-3V RIGHT; DG HIP (WITH OR WITHOUT PELVIS) 2-3V LEFT COMPARISON:  CT Abdomen and Pelvis 01/02/2021. FINDINGS: A catheter projects just to the right of the pelvic midline at the pubic rami, probably a urethral catheter. Femoral heads normally located. Pelvis appears intact. Bone mineralization is within normal limits for age. Proximal right femur appears intact. Proximal left femur appears intact. Symmetric SI joints. No acute osseous abnormality identified. Negative visible bowel gas. IMPRESSION: No acute fracture or dislocation identified about the bilateral hips or pelvis. Electronically Signed   By: Odessa Fleming M.D.   On: 01/29/2021 11:20   DG HIP UNILAT WITH PELVIS 2-3 VIEWS RIGHT  Result Date: 01/29/2021 CLINICAL DATA:  87 year old male status post fall on Eliquis. EXAM: DG HIP (WITH OR WITHOUT PELVIS) 2-3V RIGHT; DG HIP (WITH OR WITHOUT PELVIS) 2-3V LEFT COMPARISON:  CT Abdomen and Pelvis 01/02/2021. FINDINGS: A catheter projects just to the right of the pelvic midline at the pubic rami, probably a urethral catheter. Femoral heads normally located. Pelvis appears intact. Bone mineralization is within normal limits for age. Proximal right femur appears intact. Proximal left femur appears intact. Symmetric SI joints. No acute osseous abnormality identified. Negative visible bowel gas. IMPRESSION: No acute fracture or dislocation identified about the bilateral hips or pelvis. Electronically Signed   By: Odessa Fleming M.D.   On: 01/29/2021 11:20    EKG: Independently reviewed. Sinus (computer read as A. Fib), no significant ST-T changes.  Assessment/Plan Active Problems:   AKI (acute kidney injury) (HCC)  (please populate well all problems here in Problem List. (For example, if patient is on BP meds at home and you resume or decide to hold them, it is a problem that needs to be her. Same for CAD, COPD, HLD and so  on)  Gross hematuria -No significant drop of H&H but patient probably hemoconcentrated given the poor oral intake for last 2 days. -We will have to hold Eliquis for now until H&H stabilized.  No chemical DVT prophylaxis until H&H repeat stable.  SCD for now. -Urology ordered CT abdomen pelvis and will see the patient emergently this afternoon.  AKI -Probably multifactorial, major issue is likely post renal from large blood clot blocking bladder probably ureter outlet, CT pending.  Multifactorial probably from dehydration and poor intake/hypovolemia, will start bicarb drip which will help hyperkalemia as well.  Acute metabolic encephalopathy -Likely from AKI, mild uremia, will treat with IV fluid then reevaluate.  Given his age and  DNR status, not a candidate for HD.  Hyperkalemia -Likely from AKI -No significant EKG changes, Lokelma x1, bicarb drip.  Leukocytosis -Recent UTI, completed 7 days of p.o. antibiotics recently. -Probably related to the AKI and acute hematuria/blood loss, will monitor off antibiotics for now.  HTN -Hold home BP medication, as needed hydralazine for now.  BPH, chronic ureteral obstruction on indwelling Foley -Foley was exchanged today.  DVT prophylaxis: SCD Code Status: DNR Family Communication: Stepdaughter at bedside Disposition Plan: Expect more than 2 midnight hospital stay Consults called: Urology Admission status: Tele admit   Emeline General MD [facility] Pager [phone]  01/29/2021, 1:54 PM

## 2021-01-29 NOTE — ED Notes (Signed)
Patient transported to CT with Trauma RN 

## 2021-01-29 NOTE — ED Provider Notes (Addendum)
[facility] EMERGENCY DEPARTMENT Provider Note   CSN: [id number] Arrival date & time: 01/29/21  9147     History Chief Complaint  Patient presents with   Fall   Level 2    Corey Cummings is a 87 y.o. male.  HPI 87 year old male transferred via EMS from skilled nursing facility with report of fall from wheelchair.  Patient is on Eliquis and was a level 2 trauma.  Patient was transported via EMS with cervical collar in place.  They report patient complained of head pain and neck pain.  Staff did not witness fall.  Patient is nonambulatory and had been in wheelchair.  EMS reports the patient stated that he leaned over to pick something up and fell out of the chair.  He is complaining of head and neck pain.  There is a skin tear to his left elbow.  He has no other complaints.    Past Medical History:  Diagnosis Date   BPH (benign prostatic hyperplasia)    Hypertension     Patient Active Problem List   Diagnosis Date Noted   Hydronephrosis of right kidney 01/02/2021   Complicated UTI (urinary tract infection) 01/02/2021   Hypernatremia 01/02/2021   Essential hypertension 01/02/2021   Protein-calorie malnutrition, severe 12/29/2020   Pressure injury of skin 12/24/2020   Acute metabolic encephalopathy 12/14/2020   Hypokalemia 12/14/2020   History of DVT (deep vein thrombosis) 12/14/2020   History of pulmonary embolus (PE) 12/14/2020   Severe sepsis (HCC) 12/04/2020   Lactic acidosis 12/04/2020   Acute cystitis without hematuria 12/04/2020   Foley catheter problem (HCC) 12/04/2020   Unilateral recurrent inguinal hernia without obstruction or gangrene 12/04/2020   Acute pyelonephritis 12/04/2020   BPH (benign prostatic hyperplasia) 12/04/2020   Chronic indwelling Foley catheter 12/04/2020   Hypertensive urgency 12/04/2020   CKD (chronic kidney disease), stage III (HCC) 12/04/2020   Acute pulmonary embolism (HCC) 02/24/2020   Ureteral obstruction, right  02/24/2020   Urinary tract infection associated with catheterization of urinary tract, initial encounter (HCC) 02/24/2020   Renal insufficiency 02/24/2020   Thoracic aortic aneurysm without rupture 02/24/2020   Bronchiectasis (HCC) 02/24/2020   Pulmonary embolism (HCC) 02/24/2020   Hydronephrosis with urinary obstruction due to ureteral calculus     Past Surgical History:  Procedure Laterality Date   APPENDECTOMY     CYSTOSCOPY W/ URETERAL STENT PLACEMENT Right 12/04/2020   Procedure: CYSTOSCOPY WITH RETROGRADE PYELOGRAM/URETERAL STENT PLACEMENT;  Surgeon: Jannifer Hick, MD;  Location: [facility];  Service: Urology;  Laterality: Right;   CYSTOSCOPY W/ URETERAL STENT PLACEMENT Right 01/03/2021   Procedure: CYSTOSCOPY LITHOTRIPSY WITH RETROGRADE PYELOGRAM/ STENT REMOVAL;  Surgeon: Noel Christmas, MD;  Location: [facility];  Service: Urology;  Laterality: Right;       History reviewed. No pertinent family history.  Social History   Tobacco Use   Smoking status: Former    Types: Cigarettes   Smokeless tobacco: Never   Tobacco comments:    Quit 40 years ago  Substance Use Topics   Alcohol use: Not Currently   Drug use: Not Currently    Home Medications Prior to Admission medications   Medication Sig Start Date End Date Taking? Authorizing Provider  acidophilus (RISAQUAD) CAPS capsule Take 1 capsule by mouth daily. 12/27/20   Lanae Boast, MD  amLODipine (NORVASC) 5 MG tablet Take 1 tablet (5 mg total) by mouth daily. 12/07/20   Erick Blinks, MD  amoxicillin-clavulanate (AUGMENTIN) 875-125 MG tablet Take 1 tablet by  mouth 2 (two) times daily. One po bid x 7 days 01/13/21   Mesner, Barbara Cower, MD  apixaban (ELIQUIS) 5 MG TABS tablet Take 5 mg by mouth 2 (two) times daily.    [provider]  ascorbic acid (VITAMIN C) 250 MG tablet Take 1 tablet (250 mg total) by mouth daily. 01/09/21   Narda Bonds, MD  feeding supplement (ENSURE ENLIVE / ENSURE PLUS) LIQD Take 237 mLs by mouth 2  (two) times daily between meals. 12/29/20   Lanae Boast, MD  finasteride (PROSCAR) 5 MG tablet Take 5 mg by mouth at bedtime. 01/12/20   [provider]  Iron-FA-B Cmp-C-Biot-Probiotic (FUSION PLUS) CAPS Take 1 capsule by mouth daily. 11/28/20   [provider]  Multiple Vitamin (MULTIVITAMIN WITH MINERALS) TABS tablet Take 1 tablet by mouth daily. 12/29/20   Lanae Boast, MD  tamsulosin (FLOMAX) 0.4 MG CAPS capsule Take 0.4 mg by mouth daily. 10/20/20   [provider]  zinc sulfate 220 (50 Zn) MG capsule Take 1 capsule (220 mg total) by mouth daily. 01/09/21   Narda Bonds, MD    Allergies    Patient has no known allergies.  Review of Systems   Review of Systems  All other systems reviewed and are negative.  Physical Exam Updated Vital Signs BP 113/74   Pulse 96   Temp 98 F (36.7 C) (Rectal)   Resp 18   Ht 1.753 m ( )   Wt 56.7 kg   SpO2 96%   BMI 18.46 kg/m   Physical Exam Vitals and nursing note reviewed.  Constitutional:      Appearance: Normal appearance.  HENT:     Head: Normocephalic.     Right Ear: External ear normal.     Left Ear: External ear normal.     Nose: Nose normal.     Mouth/Throat:     Pharynx: Oropharynx is clear.  Eyes:     Pupils: Pupils are equal, round, and reactive to light.  Pulmonary:     Effort: Pulmonary effort is normal.     Breath sounds: Normal breath sounds.  Abdominal:     Palpations: Abdomen is soft.  Musculoskeletal:     Cervical back: Normal range of motion.     Comments: Skin tear left elbow Some tenderness over posterior cervical spine No tenderness palpated over thoracic or lumbar spine No signs of trauma noted on palpation visual inspection of back Some tenderness bilateral hips Pelvis appears stable  Neurological:     Mental Status: He is alert.    ED Results / Procedures / Treatments   Labs (all labs ordered are listed, but only abnormal results are displayed) Labs Reviewed  CBC -  Abnormal; Notable for the following components:      Result Value   WBC 13.7 (*)    RBC 3.45 (*)    Hemoglobin 9.2 (*)    HCT 30.0 (*)    RDW 18.4 (*)    All other components within normal limits  BASIC METABOLIC PANEL - Abnormal; Notable for the following components:   Potassium 5.9 (*)    CO2 21 (*)    Glucose, Bld 161 (*)    BUN 56 (*)    Creatinine, Ser 2.43 (*)    GFR, Estimated 25 (*)    All other components within normal limits  PROTIME-INR - Abnormal; Notable for the following components:   Prothrombin Time 16.2 (*)    INR 1.3 (*)  All other components within normal limits  URINALYSIS, ROUTINE W REFLEX MICROSCOPIC - Abnormal; Notable for the following components:   Color, Urine RED (*)    APPearance TURBID (*)    Glucose, UA   (*)    Value: TEST NOT REPORTED DUE TO COLOR INTERFERENCE OF URINE PIGMENT   Hgb urine dipstick   (*)    Value: TEST NOT REPORTED DUE TO COLOR INTERFERENCE OF URINE PIGMENT   Bilirubin Urine   (*)    Value: TEST NOT REPORTED DUE TO COLOR INTERFERENCE OF URINE PIGMENT   Ketones, ur   (*)    Value: TEST NOT REPORTED DUE TO COLOR INTERFERENCE OF URINE PIGMENT   Protein, ur   (*)    Value: TEST NOT REPORTED DUE TO COLOR INTERFERENCE OF URINE PIGMENT   Nitrite   (*)    Value: TEST NOT REPORTED DUE TO COLOR INTERFERENCE OF URINE PIGMENT   Leukocytes,Ua   (*)    Value: TEST NOT REPORTED DUE TO COLOR INTERFERENCE OF URINE PIGMENT   All other components within normal limits  URINALYSIS, MICROSCOPIC (REFLEX) - Abnormal; Notable for the following components:   Bacteria, UA FEW (*)    All other components within normal limits  RESP PANEL BY RT-PCR (FLU A&B, COVID) [id number]  URINE CULTURE    EKG None  Radiology DG Elbow Complete Left  Result Date: 01/29/2021 CLINICAL DATA:  87 year old male status post fall on Eliquis. EXAM: LEFT ELBOW - COMPLETE 3+ VIEW COMPARISON:  None. FINDINGS: Incidental antecubital fossa IV artifact. Chronic joint space  loss, degenerative spurring and chronic Medial condyle fragmentation about the elbow. No joint effusion, acute fracture or dislocation identified. IMPRESSION: Degenerative changes.  No acute fracture or dislocation identified. Electronically Signed   By: Odessa Fleming M.D.   On: 01/29/2021 11:19   CT Head Wo Contrast  Result Date: 01/29/2021 CLINICAL DATA:  87 year old male status post fall. EXAM: CT HEAD WITHOUT CONTRAST TECHNIQUE: Contiguous axial images were obtained from the base of the skull through the vertex without intravenous contrast. COMPARISON:  Brain MRI 01/05/2021.  Recent head CT 01/12/2021. FINDINGS: Brain: Motion artifact at the skull base. Stable cerebral volume. Stable gray-white matter differentiation throughout the brain. No midline shift, ventriculomegaly, mass effect, evidence of mass lesion, intracranial hemorrhage or evidence of cortically based acute infarction. Vascular: Calcified atherosclerosis at the skull base. Skull: Motion artifact at the level of the anterior cranial fossa. No acute osseous abnormality identified. Cervical spine is detailed separately. Sinuses/Orbits: Visualized paranasal sinuses and mastoids are clear. Other: No orbit or scalp soft tissue injury identified. IMPRESSION: 1. Mildly degraded by motion. No acute traumatic injury identified. Stable non contrast CT appearance of the brain. 2. Cervical Spine CT is detailed separately. Electronically Signed   By: Odessa Fleming M.D.   On: 01/29/2021 09:20   CT Cervical Spine Wo Contrast  Result Date: 01/29/2021 CLINICAL DATA:  87 year old male status post fall. EXAM: CT CERVICAL SPINE WITHOUT CONTRAST TECHNIQUE: Multidetector CT imaging of the cervical spine was performed without intravenous contrast. Multiplanar CT image reconstructions were also generated. COMPARISON:  12/14/2020. FINDINGS: Alignment: Chronic straightening of cervical lordosis. Mild reversal today. But subtle anterolisthesis of C2 on C3 and C7 on T1 appears  stable. Stable posterior element alignment. Skull base and vertebrae: Visualized skull base is intact. No atlanto-occipital dissociation. C1 and C2 appear intact and aligned. No acute osseous abnormality identified. Soft tissues and spinal canal: No prevertebral fluid or swelling. No visible canal hematoma. Stable  visible noncontrast neck soft tissues. Disc levels: Chronically advanced cervical spine degeneration appears stable since September. Advanced chronic facet arthropathy on the right at C2-C3. Chronic degenerative spinal stenosis at C2-C3 and C3-C4.Evidence of developing facet ankylosis on the left at C7-T1. Upper chest: Visible upper thoracic levels appear intact. Negative visible lung apices. IMPRESSION: 1. No acute traumatic injury identified in the cervical spine. 2. Chronically advanced cervical spine degeneration appears stable since September including chronic spinal stenosis at both C2-C3 and C3-C4. Electronically Signed   By: Odessa Fleming M.D.   On: 01/29/2021 09:25   DG Chest Portable 1 View  Result Date: 01/29/2021 CLINICAL DATA:  fall on thinners EXAM: PORTABLE CHEST 1 VIEW COMPARISON:  January 08, 2021 FINDINGS: The cardiomediastinal silhouette is unchanged in contour.Tortuous thoracic aorta. Atherosclerotic calcifications. No pleural effusion. No pneumothorax. No acute pleuroparenchymal abnormality. Visualized abdomen is unremarkable. Known L1 compression fracture is not well visualized with AP technique. IMPRESSION: No acute cardiopulmonary abnormality. Electronically Signed   By: Meda Klinefelter M.D.   On: 01/29/2021 09:49   DG HIP UNILAT WITH PELVIS 2-3 VIEWS LEFT  Result Date: 01/29/2021 CLINICAL DATA:  87 year old male status post fall on Eliquis. EXAM: DG HIP (WITH OR WITHOUT PELVIS) 2-3V RIGHT; DG HIP (WITH OR WITHOUT PELVIS) 2-3V LEFT COMPARISON:  CT Abdomen and Pelvis 01/02/2021. FINDINGS: A catheter projects just to the right of the pelvic midline at the pubic rami, probably a  urethral catheter. Femoral heads normally located. Pelvis appears intact. Bone mineralization is within normal limits for age. Proximal right femur appears intact. Proximal left femur appears intact. Symmetric SI joints. No acute osseous abnormality identified. Negative visible bowel gas. IMPRESSION: No acute fracture or dislocation identified about the bilateral hips or pelvis. Electronically Signed   By: Odessa Fleming M.D.   On: 01/29/2021 11:20   DG HIP UNILAT WITH PELVIS 2-3 VIEWS RIGHT  Result Date: 01/29/2021 CLINICAL DATA:  87 year old male status post fall on Eliquis. EXAM: DG HIP (WITH OR WITHOUT PELVIS) 2-3V RIGHT; DG HIP (WITH OR WITHOUT PELVIS) 2-3V LEFT COMPARISON:  CT Abdomen and Pelvis 01/02/2021. FINDINGS: A catheter projects just to the right of the pelvic midline at the pubic rami, probably a urethral catheter. Femoral heads normally located. Pelvis appears intact. Bone mineralization is within normal limits for age. Proximal right femur appears intact. Proximal left femur appears intact. Symmetric SI joints. No acute osseous abnormality identified. Negative visible bowel gas. IMPRESSION: No acute fracture or dislocation identified about the bilateral hips or pelvis. Electronically Signed   By: Odessa Fleming M.D.   On: 01/29/2021 11:20    Procedures .Critical Care Performed by: Margarita Grizzle, MD Authorized by: Margarita Grizzle, MD   Critical care provider statement:    Critical care time (minutes):  45   Critical care end time:  01/29/2021 1:15 PM   Critical care time was exclusive of:  Separately billable procedures and treating other patients   Critical care was necessary to treat or prevent imminent or life-threatening deterioration of the following conditions:  Dehydration and endocrine crisis   Critical care was time spent personally by me on the following activities:  Development of treatment plan with patient or surrogate, discussions with consultants, evaluation of patient's response to  treatment, examination of patient, ordering and performing treatments and interventions, ordering and review of laboratory studies, ordering and review of radiographic studies, pulse oximetry, re-evaluation of patient's condition and review of old charts   Medications Ordered in ED Medications  sodium chloride  0.9 % bolus 1,000 mL (1,000 mLs Intravenous New Bag/Given 01/29/21 1209)    ED Course  I have reviewed the triage vital signs and the nursing notes.  Pertinent labs & imaging results that were available during my care of the patient were reviewed by me and considered in my medical decision making (see chart for details).  Clinical Course as of 01/29/21 1232  Sun Jan 29, 2021  3664 Evaded creatinine at 2.43.  Patient with blood in Foley.  Plan bladder scan and replacement of Foley catheter to assess for postobstructive etiology of his renal failure.  His potassium is elevated at 5.9.  Will give fluid bolus. [DR]    Clinical Course User Index [DR] Margarita Grizzle, MD   MDM Rules/Calculators/A&P                         Review of d/c summary from 8/31 Patient had just been hospitalized from 8/28 -8/31 for severe sepsis secondary to catheter associated urinary tract infection with hydronephrosis secondary to right UVJ calculus.  Urology has been c consulted and placed stents.  Urine cultures grew out Citrobacter freundii(resistant to cefazolin, Rocephin, and nitrofurantoin. He was admitted on 12/14/20 to 12/29/20 and treated with cefepime as well as Diflucan as there is yeast in his urine based on cultures. He was discharged to SNF.  He was sent to ER today with elevated sodium level and confusion.  Daughter reports that when she went to visit him he was staring at the ceiling and reaching for things that were not there and was combative which is not his normal self.  There was no report of any fever or chills.  There is no report of any vomiting or diarrhea.  He has been weak and is having  physical therapy at the skilled nursing facility.  He is confused not able to provide any history himself.  Daughter states that he has not had any falls that she knows of for head trauma.     Patient with reported mechanical fall today.  No definitive new injury seen from fall.  However patient noted to have bright red blood in the catheter.  Reports this is fairly new.  He also has a newly elevated creatinine at 2.43.  There was 1 L of urine present on folate replacement.  This was red and turbulent.  Patient is receiving 1 L of normal saline. Plan consult to pharmacy regarding biotics given patient's recent infection with Citrobacter.  Additionally he had Diflucan given due to yeast in his urine based on cultures. Hyperkalemia present No acute EKG changes Patient receiving saline and will have potassium rechecked Will obtain CT abdomen pelvis without contrast. Plan consultation with hospitalist service and urology for further evaluation and admission Patient has DNR papers at bedside  1- Fall- no acute abnormalities seen on imaging 2 hematuria with urinary retention.  UTI with recent UTI which grew out Citrobacter.  Awaiting pharmacy consultation regarding antibiotics-discussed with Dr. Laverle Patter.  He advises urine culture and consider holding antibiotics at this time. 3 acute kidney injury 4 hyperkalemia 5 DNR states that he does not wish intubation or CPR but does not want other care. Final Clinical Impression(s) / ED Diagnoses Final diagnoses:  Fall    Rx / DC Orders ED Discharge Orders     None        Margarita Grizzle, MD 01/29/21 1315    Margarita Grizzle, MD 02/09/21 1219

## 2021-01-30 DIAGNOSIS — D62 Acute posthemorrhagic anemia: Secondary | ICD-10-CM | POA: Diagnosis not present

## 2021-01-30 DIAGNOSIS — R310 Gross hematuria: Secondary | ICD-10-CM

## 2021-01-30 DIAGNOSIS — N179 Acute kidney failure, unspecified: Secondary | ICD-10-CM | POA: Diagnosis not present

## 2021-01-30 LAB — CBC WITH DIFFERENTIAL/PLATELET
Abs Immature Granulocytes: 0.02 10*3/uL (ref 0.00–0.07)
Basophils Absolute: 0 10*3/uL (ref 0.0–0.1)
Basophils Relative: 0 %
Eosinophils Absolute: 0.2 10*3/uL (ref 0.0–0.5)
Eosinophils Relative: 3 %
HCT: 24 % — ABNORMAL LOW (ref 39.0–52.0)
Hemoglobin: 7.2 g/dL — ABNORMAL LOW (ref 13.0–17.0)
Immature Granulocytes: 0 %
Lymphocytes Relative: 14 %
Lymphs Abs: 1 10*3/uL (ref 0.7–4.0)
MCH: 26.1 pg (ref 26.0–34.0)
MCHC: 30 g/dL (ref 30.0–36.0)
MCV: 87 fL (ref 80.0–100.0)
Monocytes Absolute: 0.6 10*3/uL (ref 0.1–1.0)
Monocytes Relative: 9 %
Neutro Abs: 5.1 10*3/uL (ref 1.7–7.7)
Neutrophils Relative %: 74 %
Platelets: 241 10*3/uL (ref 150–400)
RBC: 2.76 MIL/uL — ABNORMAL LOW (ref 4.22–5.81)
RDW: 18.3 % — ABNORMAL HIGH (ref 11.5–15.5)
WBC: 6.9 10*3/uL (ref 4.0–10.5)
nRBC: 0 % (ref 0.0–0.2)

## 2021-01-30 LAB — CBC
HCT: 28.1 % — ABNORMAL LOW (ref 39.0–52.0)
Hemoglobin: 8.9 g/dL — ABNORMAL LOW (ref 13.0–17.0)
MCH: 27.1 pg (ref 26.0–34.0)
MCHC: 31.7 g/dL (ref 30.0–36.0)
MCV: 85.4 fL (ref 80.0–100.0)
Platelets: 280 10*3/uL (ref 150–400)
RBC: 3.29 MIL/uL — ABNORMAL LOW (ref 4.22–5.81)
RDW: 17.3 % — ABNORMAL HIGH (ref 11.5–15.5)
WBC: 6.4 10*3/uL (ref 4.0–10.5)
nRBC: 0 % (ref 0.0–0.2)

## 2021-01-30 LAB — BASIC METABOLIC PANEL
Anion gap: 5 (ref 5–15)
BUN: 45 mg/dL — ABNORMAL HIGH (ref 8–23)
CO2: 25 mmol/L (ref 22–32)
Calcium: 8.1 mg/dL — ABNORMAL LOW (ref 8.9–10.3)
Chloride: 110 mmol/L (ref 98–111)
Creatinine, Ser: 1.57 mg/dL — ABNORMAL HIGH (ref 0.61–1.24)
GFR, Estimated: 42 mL/min — ABNORMAL LOW (ref >60)
Glucose, Bld: 91 mg/dL (ref 70–99)
Potassium: 4.1 mmol/L (ref 3.5–5.1)
Sodium: 140 mmol/L (ref 135–145)

## 2021-01-30 LAB — URINE CULTURE: Culture: <10000 — AB

## 2021-01-30 LAB — ABO/RH: ABO/RH(D): A POS

## 2021-01-30 LAB — PREPARE RBC (CROSSMATCH)

## 2021-01-30 MED ORDER — DEXTROSE-NACL 5-0.45 % IV SOLN: INTRAVENOUS | Status: DC

## 2021-01-30 MED ORDER — CHLORHEXIDINE GLUCONATE CLOTH 2 % EX PADS
6.0000 | MEDICATED_PAD | Freq: Every day | CUTANEOUS | Status: DC
  Administered 2021-01-30 – 2021-02-08 (×10): 6 via TOPICAL

## 2021-01-30 MED ORDER — ORAL CARE MOUTH RINSE
15.0000 mL | Freq: Two times a day (BID) | OROMUCOSAL | Status: DC
  Administered 2021-01-30 – 2021-02-09 (×20): 15 mL via OROMUCOSAL

## 2021-01-30 MED ORDER — SODIUM CHLORIDE 0.9% IV SOLUTION: Freq: Once | INTRAVENOUS | Status: AC

## 2021-01-30 NOTE — ED Notes (Signed)
Irrigated foley with NS, urine running clear after irrigation.

## 2021-01-30 NOTE — ED Notes (Signed)
Pt tolerated blood transfusion without difficulty, VSS, no signs of reaction or complication. Family member at bedside. Call light in reach.

## 2021-01-30 NOTE — Progress Notes (Signed)
Subjective: Denies pain. Tolerating foley. Urine clear yellow in tubing.  Objective: Vital signs in last 24 hours: Temp:  [98 F (36.7 C)-98.4 F (36.9 C)] 98 F (36.7 C) (10/23 1153) Pulse Rate:  [42-99] 72 (10/24 0630) Resp:  [11-25] 18 (10/24 0630) BP: (94-186)/(56-167) 118/77 (10/24 0630) SpO2:  [90 %-100 %] 100 % (10/24 0630) Weight:  [56.7 kg] 56.7 kg (10/23 0901)  Intake/Output from previous day: 10/23 0701 - 10/24 0700 In: -  Out: 2050 [Urine:2050] Intake/Output this shift: No intake/output data recorded.  Physical Exam:  General: Alert and oriented CV: RRR Lungs: Clear Abdomen: Soft, ND, NT Ext: NT, No erythema  Lab Results: Recent Labs    01/29/21 0900 01/29/21 1759 01/30/21 0421  HGB 9.2* 8.0* 7.2*  HCT 30.0* 26.7* 24.0*   BMET Recent Labs    01/29/21 1759 01/30/21 0421  NA 139 140  K 4.6 4.1  CL 111 110  CO2 21* 25  GLUCOSE 113* 91  BUN 51* 45*  CREATININE 1.90* 1.57*  CALCIUM 8.3* 8.1*     Studies/Results: DG Elbow Complete Left  Result Date: 01/29/2021 CLINICAL DATA:  87 year old male status post fall on Eliquis. EXAM: LEFT ELBOW - COMPLETE 3+ VIEW COMPARISON:  None. FINDINGS: Incidental antecubital fossa IV artifact. Chronic joint space loss, degenerative spurring and chronic Medial condyle fragmentation about the elbow. No joint effusion, acute fracture or dislocation identified. IMPRESSION: Degenerative changes.  No acute fracture or dislocation identified. Electronically Signed   By: Odessa Fleming M.D.   On: 01/29/2021 11:19   CT Head Wo Contrast  Result Date: 01/29/2021 CLINICAL DATA:  87 year old male status post fall. EXAM: CT HEAD WITHOUT CONTRAST TECHNIQUE: Contiguous axial images were obtained from the base of the skull through the vertex without intravenous contrast. COMPARISON:  Brain MRI 01/05/2021.  Recent head CT 01/12/2021. FINDINGS: Brain: Motion artifact at the skull base. Stable cerebral volume. Stable gray-white matter  differentiation throughout the brain. No midline shift, ventriculomegaly, mass effect, evidence of mass lesion, intracranial hemorrhage or evidence of cortically based acute infarction. Vascular: Calcified atherosclerosis at the skull base. Skull: Motion artifact at the level of the anterior cranial fossa. No acute osseous abnormality identified. Cervical spine is detailed separately. Sinuses/Orbits: Visualized paranasal sinuses and mastoids are clear. Other: No orbit or scalp soft tissue injury identified. IMPRESSION: 1. Mildly degraded by motion. No acute traumatic injury identified. Stable non contrast CT appearance of the brain. 2. Cervical Spine CT is detailed separately. Electronically Signed   By: Odessa Fleming M.D.   On: 01/29/2021 09:20   CT Cervical Spine Wo Contrast  Result Date: 01/29/2021 CLINICAL DATA:  87 year old male status post fall. EXAM: CT CERVICAL SPINE WITHOUT CONTRAST TECHNIQUE: Multidetector CT imaging of the cervical spine was performed without intravenous contrast. Multiplanar CT image reconstructions were also generated. COMPARISON:  12/14/2020. FINDINGS: Alignment: Chronic straightening of cervical lordosis. Mild reversal today. But subtle anterolisthesis of C2 on C3 and C7 on T1 appears stable. Stable posterior element alignment. Skull base and vertebrae: Visualized skull base is intact. No atlanto-occipital dissociation. C1 and C2 appear intact and aligned. No acute osseous abnormality identified. Soft tissues and spinal canal: No prevertebral fluid or swelling. No visible canal hematoma. Stable visible noncontrast neck soft tissues. Disc levels: Chronically advanced cervical spine degeneration appears stable since September. Advanced chronic facet arthropathy on the right at C2-C3. Chronic degenerative spinal stenosis at C2-C3 and C3-C4.Evidence of developing facet ankylosis on the left at C7-T1. Upper chest: Visible upper thoracic levels  appear intact. Negative visible lung apices.  IMPRESSION: 1. No acute traumatic injury identified in the cervical spine. 2. Chronically advanced cervical spine degeneration appears stable since September including chronic spinal stenosis at both C2-C3 and C3-C4. Electronically Signed   By: Odessa Fleming M.D.   On: 01/29/2021 09:25   DG Chest Portable 1 View  Result Date: 01/29/2021 CLINICAL DATA:  fall on thinners EXAM: PORTABLE CHEST 1 VIEW COMPARISON:  January 08, 2021 FINDINGS: The cardiomediastinal silhouette is unchanged in contour.Tortuous thoracic aorta. Atherosclerotic calcifications. No pleural effusion. No pneumothorax. No acute pleuroparenchymal abnormality. Visualized abdomen is unremarkable. Known L1 compression fracture is not well visualized with AP technique. IMPRESSION: No acute cardiopulmonary abnormality. Electronically Signed   By: Meda Klinefelter M.D.   On: 01/29/2021 09:49   CT Renal Stone Study  Result Date: 01/29/2021 CLINICAL DATA:  Headache and back pain after unwitnessed fall. On blood thinners. Hematuria. EXAM: CT ABDOMEN AND PELVIS WITHOUT CONTRAST TECHNIQUE: Multidetector CT imaging of the abdomen and pelvis was performed following the standard protocol without IV contrast. COMPARISON:  Abdominopelvic CT 01/02/2021 and 12/14/2020. FINDINGS: Lower chest: Previously demonstrated small right pleural effusion has resolved. The lung bases are clear. Hepatobiliary: No focal hepatic abnormalities are identified on noncontrast imaging. Faint dependent increased density within the gallbladder lumen is similar to the previous study, and likely indicative of sludge. No calcified gallstones, gallbladder Born thickening or biliary dilatation identified. Pancreas: Unremarkable. No pancreatic ductal dilatation or surrounding inflammatory changes. Spleen: Normal in size without focal abnormality. Adrenals/Urinary Tract: Both adrenal glands appear normal. Interval fragmentation of the dominant calculus at the right ureterovesical junction and  removal of the right ureteral stent. There are multiple small stone fragments in the distal right ureter. There are probable stone fragments dependently within the massively dilated right intrarenal collecting system. The degree of dilatation is unchanged from the recent prior study, and there is associated severe right renal cortical thinning. This collecting system dilatation has increased from 12/14/2020. Interval increased density within a dilated right lower pole calyx, likely reflecting hemorrhage (measuring 4.8 cm on image 40/4). Left renal cortical scarring and milder dilatation of the left renal pelvis and left ureter are unchanged. The bladder is trabeculated. A Foley catheter is in place, now extending into the bladder lumen and accounting for air in the bladder lumen. Stomach/Bowel: No enteric contrast administered. The stomach appears unremarkable for its degree of distension. No evidence of bowel Kneisel thickening, distention or surrounding inflammatory change. Diverticular changes again noted throughout the descending and sigmoid colon. Vascular/Lymphatic: There are no enlarged abdominal or pelvic lymph nodes. Scattered aortic and branch vessel atherosclerosis. Reproductive: Stable moderate asymmetric prostatomegaly, greater on the left. Other: No evidence of ascites, free air or focal extraluminal fluid collection. Mild generalized soft tissue edema. Musculoskeletal: No acute or significant osseous findings. Stable superior endplate compression deformity at L1, chronic. IMPRESSION: 1. Interval fragmentation of dominant obstructing calculus in the distal right ureter and removal of the right ureteral stent. There are multiple small stone fragments in the distal right ureter with persistent marked dilatation of the right renal pelvis and right ureter. This collecting system dilatation has increased from 12/14/2020 and implies persistent distal ureteral obstruction. 2. Interval increased density within a  dilated calyx in the lower pole of the right kidney, most likely representing hemorrhage. 3. Foley catheter has been repositioned in the bladder lumen. The bladder is largely decompressed. 4. Distal colonic diverticulosis without evidence of acute inflammation. 5.  Aortic Atherosclerosis (ICD10-I70.0). Electronically  Signed   By: Carey Bullocks M.D.   On: 01/29/2021 14:28   DG HIP UNILAT WITH PELVIS 2-3 VIEWS LEFT  Result Date: 01/29/2021 CLINICAL DATA:  87 year old male status post fall on Eliquis. EXAM: DG HIP (WITH OR WITHOUT PELVIS) 2-3V RIGHT; DG HIP (WITH OR WITHOUT PELVIS) 2-3V LEFT COMPARISON:  CT Abdomen and Pelvis 01/02/2021. FINDINGS: A catheter projects just to the right of the pelvic midline at the pubic rami, probably a urethral catheter. Femoral heads normally located. Pelvis appears intact. Bone mineralization is within normal limits for age. Proximal right femur appears intact. Proximal left femur appears intact. Symmetric SI joints. No acute osseous abnormality identified. Negative visible bowel gas. IMPRESSION: No acute fracture or dislocation identified about the bilateral hips or pelvis. Electronically Signed   By: Odessa Fleming M.D.   On: 01/29/2021 11:20   DG HIP UNILAT WITH PELVIS 2-3 VIEWS RIGHT  Result Date: 01/29/2021 CLINICAL DATA:  87 year old male status post fall on Eliquis. EXAM: DG HIP (WITH OR WITHOUT PELVIS) 2-3V RIGHT; DG HIP (WITH OR WITHOUT PELVIS) 2-3V LEFT COMPARISON:  CT Abdomen and Pelvis 01/02/2021. FINDINGS: A catheter projects just to the right of the pelvic midline at the pubic rami, probably a urethral catheter. Femoral heads normally located. Pelvis appears intact. Bone mineralization is within normal limits for age. Proximal right femur appears intact. Proximal left femur appears intact. Symmetric SI joints. No acute osseous abnormality identified. Negative visible bowel gas. IMPRESSION: No acute fracture or dislocation identified about the bilateral hips or  pelvis. Electronically Signed   By: Odessa Fleming M.D.   On: 01/29/2021 11:20    Assessment/Plan: Gross hematuria: Multifactorial due to large prostate, anticoagulation and catheter trauma. Chronic right hydronephrosis with minimal parenchyma. Right retrograde pyelogram 01/03/2021 with no obstruction identified. Very patulous right ureteral orifice.  -His foley is currently draining clear yellow urine. Hold anticoagulation for another day if possible. Manually irrigate q6 hours and prn for clots or decreased drainage. -Noted hgb 7.2 from 8. Transfuse per primary if <7. -Creatinine improving after foley in better position at 1.57.  -Hydronephrosis is chronic and will improve to some extent as catheter now draining appropriately.  -No indication for ureteral stent presently.    LOS: 1 day   Matt R. Jennie Bolar MD 01/30/2021, 7:51 AM [facility]  Pager: [phone]

## 2021-01-30 NOTE — Progress Notes (Signed)
Patient's step daughter and POA said patient lost about 25 lbs since August 2022

## 2021-01-30 NOTE — Evaluation (Signed)
Physical Therapy Evaluation Patient Details Name: Corey Cummings MRN: [id number] DOB: 02/13/1931 Today's Date: 01/30/2021  History of Present Illness  Pt is an 87 y/o male admitted from SNF on 10/23 secondary to fall and hydronephrosis. PMH includes HTN, chronic indwelling Foley catheter, nephrolithiasis, PE on Xarelto, and BPH.  Clinical Impression  Pt admitted secondary to problem above with deficits below. Pt very lethargic during session and required total A +2 for clean up and repositioning on L side. Pt's stepdaughter present and reports pt from SNF and has been working with PT on ambulation. Also reports staff supposed to be assisting with transfers to [facility] and for ADLs when not with therapy. Recommend return to SNF at d/c for continued therapy. Will continue to follow acutely.        Recommendations for follow up therapy are one component of a multi-disciplinary discharge planning process, led by the attending physician.  Recommendations may be updated based on patient status, additional functional criteria and insurance authorization.  Follow Up Recommendations Skilled nursing-short term rehab (<3 hours/day) (was at [facility])    Assistance Recommended at Discharge Frequent or constant Supervision/Assistance  Functional Status Assessment Patient has had a recent decline in their functional status and demonstrates the ability to make significant improvements in function in a reasonable and predictable amount of time.  Equipment Recommendations  Wheelchair cushion (measurements PT);Wheelchair (measurements PT);Hospital bed;Other (comment) (hoyer lift with pad)    Recommendations for Other Services       Precautions / Restrictions Precautions Precautions: Fall Restrictions Weight Bearing Restrictions: No      Mobility  Bed Mobility Overal bed mobility: Needs Assistance Bed Mobility: Rolling Rolling: Total assist;+2 for physical assistance         General bed mobility  comments: Required total A +2 for rolling for clean up following BM and for positioning on L side. Pt lethargic throughout and unable to assist.    Transfers                        Ambulation/Gait                Stairs            Wheelchair Mobility    Modified Rankin (Stroke Patients Only)       Balance                                             Pertinent Vitals/Pain Pain Assessment: Faces Faces Pain Scale: Hurts a little bit Pain Location: generalized Pain Descriptors / Indicators: Guarding;Grimacing Pain Intervention(s): Monitored during session;Limited activity within patient's tolerance;Repositioned    Home Living Family/patient expects to be discharged to:: Skilled nursing facility                   Additional Comments: Per stepdaughter was at SNF for rehab    Prior Function Prior Level of Function : Needs assist       Physical Assist : Mobility (physical);ADLs (physical) Mobility (physical): Bed mobility;Transfers;Gait ADLs (physical): Dressing;Bathing;Toileting;IADLs Mobility Comments: Per stepdaughter, pt was working with PT on ambulation. Reports he is supposed to be up in [facility] when not with PT, but has been in bed alot ADLs Comments: Pt's stepdaughter reports staff is supposed to be helping with ADL tasks.     Hand Dominance  Extremity/Trunk Assessment   Upper Extremity Assessment Upper Extremity Assessment: Defer to OT evaluation    Lower Extremity Assessment Lower Extremity Assessment: Generalized weakness       Communication   Communication: Expressive difficulties;Other (comment) (very lethargic)  Cognition Arousal/Alertness: Lethargic Behavior During Therapy: Flat affect Overall Cognitive Status: Difficult to assess                                 General Comments: Pt very lethargic throughout even with mobility and positioning.        General Comments General  comments (skin integrity, edema, etc.): Pt with notable pressure wound on sacrum. RN present and aware    Exercises     Assessment/Plan    PT Assessment Patient needs continued PT services  PT Problem List Decreased strength;Decreased activity tolerance;Decreased balance;Decreased mobility;Decreased cognition;Decreased safety awareness;Decreased knowledge of use of DME       PT Treatment Interventions DME instruction;Gait training;Stair training;Functional mobility training;Therapeutic activities;Therapeutic exercise;Balance training;Cognitive remediation;Neuromuscular re-education;Patient/family education;Wheelchair mobility training    PT Goals (Current goals can be found in the Care Plan section)  Acute Rehab PT Goals Patient Stated Goal: for pt to get better per stepdaughter PT Goal Formulation: With family Time For Goal Achievement: 02/13/21 Potential to Achieve Goals: Fair    Frequency Min 2X/week   Barriers to discharge        Co-evaluation               AM-PAC PT "6 Clicks" Mobility  Outcome Measure Help needed turning from your back to your side while in a flat bed without using bedrails?: Total Help needed moving from lying on your back to sitting on the side of a flat bed without using bedrails?: Total Help needed moving to and from a bed to a chair (including a wheelchair)?: Total Help needed standing up from a chair using your arms (e.g., wheelchair or bedside chair)?: Total Help needed to walk in hospital room?: Total Help needed climbing 3-5 steps with a railing? : Total 6 Click Score: 6    End of Session   Activity Tolerance: Patient limited by lethargy Patient left: in bed;with call bell/phone within reach;with family/visitor present;with nursing/sitter in room (On stretcher in ED) Nurse Communication: Mobility status;Other (comment) (Pt would benefit from air mattress) PT Visit Diagnosis: Unsteadiness on feet (R26.81);Muscle weakness (generalized)  (M62.81);Difficulty in walking, not elsewhere classified (R26.2);History of falling (Z91.81)    Time: 5027-7412 PT Time Calculation (min) (ACUTE ONLY): 21 min   Charges:   PT Evaluation $PT Eval Moderate Complexity: 1 Mod          Farley Ly, PT, DPT  Acute Rehabilitation Services  Pager: [phone] Office: [phone]   Lehman Prom 01/30/2021, 5:44 PM

## 2021-01-30 NOTE — Progress Notes (Signed)
PROGRESS NOTE    Corey Cummings  IRW:[id number] DOB: [birth year] DOA: 01/29/2021 PCP: [name], MD   Chief Complaint  Patient presents with   Fall   Level 2    Brief Narrative:   HPI: Corey Cummings is a 87 y.o. male with medical history significant of BPH with chronic indwelling Foley catheter, obstructive renal stone status post stenting, HTN, PE November 21 on Eliquis, chronic iron deficiency anemia on iron supplement, was sent from nursing home for altered mentation and fall.   Patient confused unable to provide any history, most history provided by patient stepdaughter at bedside.  Stepdaughter reported patient started to have hematuria since Tuesday, was rather light-colored beginning, then became darker over the last 2 days, and the patient started to have poor oral intake since yesterday, complaining about lower abdominal pain and back pain, no fever or chills.  Feeling weaker this morning, patient had a mechanical fall.  No loss of consciousness, no head injury.  And nursing home called EMS.   ED Course: Afebrile, vital signs stable, no hypotension or tachycardia.  So we collected gross hematuria, Foley was exchanged at ED which drained more than 1000 of gross hematuria.  Blood work showed AKI with creatinine 2.4 compared to baseline 0.9, a new hyperkalemia K= 5.9, no ST-T changes on EKG.  Trauma scan negative.   Urology was contacted, who ordered a CAT scan and will see the patient emergently this afternoon.  Assessment & Plan:   Active Problems:   AKI (acute kidney injury) (HCC)    Gross hematuria -Multifactorial due to large prostate, anticoagulation and catheter trauma -Med per urology, continue to hold anticoagulation for another 24 hours, -Irrigate every 6 hours, and as needed for clots -Patient with hydronephrosis on imaging, is chronic, and should improve as catheter is draining now.  No indication for ureteral stent presently. -Continue with Flomax and  Flomax   AKI -Probably multifactorial, prerenal, postrenal, in the setting of obstructive uropathy from clogged Foley catheter, and poor oral intake/appetite from his illness and encephalopathy.  . -Creatinine is improving, continue with IV fluids, hematomata closely for obstructive uropathy and ensure appropriate draining Foley catheter.  . -Heart has normalized, will DC drip.  Acute blood loss anemia -Hemoglobin is 7.2, due to hematuria, will give 1 unit PRBC today  History of PE -continue To hold Eliquis due to significant hematuria.  Acute metabolic encephalopathy -Likely from AKI, mild uremia, will treat with IV fluid then reevaluate.  Given his age and DNR status, not a candidate for HD.   Hyperkalemia -Resolved with lokelma   Leukocytosis -Recent UTI, completed 7 days of p.o. antibiotics recently. -Probably related to the AKI and acute hematuria/blood loss, will monitor off antibiotics for now.   HTN -Hold home BP medication, as needed hydralazine for now.   BPH, chronic ureteral obstruction on indwelling Foley -Foley was exchanged on admission..  DVT prophylaxis: SCD's Code Status: Full Family Communication: daughter [name] at bedside Disposition:   Status is: Inpatient  Remains inpatient appropriate because:        Consultants:  urolgoy   Subjective: Is altered, unable to provide any complaints, as discussed with staff no significant events overnight.  Objective: Vitals:   01/30/21 1045 01/30/21 1105 01/30/21 1130 01/30/21 1230  BP: 131/76 (!) 125/58 (!) 118/53 (!) 132/58  Pulse: 77 68 68 (!) 57  Resp: 12 14 20 16   Temp: 97.7 F (36.5 C) 97.7 F (36.5 C)    TempSrc: Oral Oral  SpO2:  98% 95% 100%  Weight:      Height:        Intake/Output Summary (Last 24 hours) at 01/30/2021 1304 Last data filed at 01/30/2021 1017 Gross per 24 hour  Intake 1654.77 ml  Output 1050 ml  Net 604.77 ml   Filed Weights   01/29/21 0901  Weight: 56.7 kg     Examination:  Lethargic, opens eyes to verbal stimuli, otherwise does not follow any commands or answer any questions.  Niccoli ill-appearing, frail, deconditioned Symmetrical Chest Schauer movement, Good air movement bilaterally, CTAB RRR,No Gallops,Rubs or new Murmurs, No Parasternal Heave +ve B.Sounds, Abd Soft, No tenderness, No rebound - guarding or rigidity. No Cyanosis, Clubbing or edema, No new Rash or bruise     Data Reviewed: I have personally reviewed following labs and imaging studies  CBC: Recent Labs  Lab 01/29/21 0900 01/29/21 1759 01/30/21 0421  WBC 13.7*  --  6.9  NEUTROABS  --   --  5.1  HGB 9.2* 8.0* 7.2*  HCT 30.0* 26.7* 24.0*  MCV 87.0  --  87.0  PLT 314  --  241    Basic Metabolic Panel: Recent Labs  Lab 01/29/21 0900 01/29/21 1759 01/30/21 0421  NA 138 139 140  K 5.9* 4.6 4.1  CL 108 111 110  CO2 21* 21* 25  GLUCOSE 161* 113* 91  BUN 56* 51* 45*  CREATININE 2.43* 1.90* 1.57*  CALCIUM 9.1 8.3* 8.1*    GFR: Estimated Creatinine Clearance: 26.1 mL/min (A) (by C-G formula based on SCr of 1.57 mg/dL (H)).  Liver Function Tests: No results for input(s): AST, ALT, ALKPHOS, BILITOT, PROT, ALBUMIN in the last 168 hours.  CBG: No results for input(s): GLUCAP in the last 168 hours.   Recent Results (from the past 240 hour(s))  Resp Panel by RT-PCR (Flu A&B, Covid) Nasopharyngeal Swab     Status: None   Collection Time: 01/29/21 11:01 AM   Specimen: Nasopharyngeal Swab; Nasopharyngeal(NP) swabs in vial transport medium  Result Value Ref Range Status   SARS Coronavirus 2 by RT PCR NEGATIVE NEGATIVE Final    Comment: (NOTE) SARS-CoV-2 target nucleic acids are NOT DETECTED.  The SARS-CoV-2 RNA is generally detectable in upper respiratory specimens during the acute phase of infection. The lowest concentration of SARS-CoV-2 viral copies this assay can detect is 138 copies/mL. A negative result does not preclude SARS-Cov-2 infection and should  not be used as the sole basis for treatment or other patient management decisions. A negative result may occur with  improper specimen collection/handling, submission of specimen other than nasopharyngeal swab, presence of viral mutation(s) within the areas targeted by this assay, and inadequate number of viral copies(<138 copies/mL). A negative result must be combined with clinical observations, patient history, and epidemiological information. The expected result is Negative.  Fact Sheet for Patients:  BloggerCourse.com  Fact Sheet for Healthcare Providers:  SeriousBroker.it  This test is no t yet approved or cleared by the [place] FDA and  has been authorized for detection and/or diagnosis of SARS-CoV-2 by FDA under an Emergency Use Authorization (EUA). This EUA will remain  in effect (meaning this test can be used) for the duration of the COVID-19 declaration under Section 564(b)(1) of the Act, 21 U.S.C.section 360bbb-3(b)(1), unless the authorization is terminated  or revoked sooner.       Influenza A by PCR NEGATIVE NEGATIVE Final   Influenza B by PCR NEGATIVE NEGATIVE Final    Comment: (NOTE) The Xpert  Xpress SARS-CoV-2/FLU/RSV plus assay is intended as an aid in the diagnosis of influenza from Nasopharyngeal swab specimens and should not be used as a sole basis for treatment. Nasal washings and aspirates are unacceptable for Xpert Xpress SARS-CoV-2/FLU/RSV testing.  Fact Sheet for Patients: BloggerCourse.com  Fact Sheet for Healthcare Providers: SeriousBroker.it  This test is not yet approved or cleared by the [place] FDA and has been authorized for detection and/or diagnosis of SARS-CoV-2 by FDA under an Emergency Use Authorization (EUA). This EUA will remain in effect (meaning this test can be used) for the duration of the COVID-19 declaration under  Section 564(b)(1) of the Act, 21 U.S.C. section 360bbb-3(b)(1), unless the authorization is terminated or revoked.  Performed at [facility], [address], [place], [place] [address]   Urine Culture     Status: Abnormal   Collection Time: 01/29/21 11:45 AM   Specimen: Urine, Catheterized  Result Value Ref Range Status   Specimen Description URINE, CATHETERIZED  Final   Special Requests NONE  Final   Culture (A)  Final    <10,000 COLONIES/mL INSIGNIFICANT GROWTH Performed at [facility], [address] [place], [place] [address]    Report Status 01/30/2021 FINAL  Final         Radiology Studies: DG Elbow Complete Left  Result Date: 01/29/2021 CLINICAL DATA:  87 year old male status post fall on Eliquis. EXAM: LEFT ELBOW - COMPLETE 3+ VIEW COMPARISON:  None. FINDINGS: Incidental antecubital fossa IV artifact. Chronic joint space loss, degenerative spurring and chronic Medial condyle fragmentation about the elbow. No joint effusion, acute fracture or dislocation identified. IMPRESSION: Degenerative changes.  No acute fracture or dislocation identified. Electronically Signed   By: Odessa Fleming M.D.   On: 01/29/2021 11:19   CT Head Wo Contrast  Result Date: 01/29/2021 CLINICAL DATA:  87 year old male status post fall. EXAM: CT HEAD WITHOUT CONTRAST TECHNIQUE: Contiguous axial images were obtained from the base of the skull through the vertex without intravenous contrast. COMPARISON:  Brain MRI 01/05/2021.  Recent head CT 01/12/2021. FINDINGS: Brain: Motion artifact at the skull base. Stable cerebral volume. Stable gray-white matter differentiation throughout the brain. No midline shift, ventriculomegaly, mass effect, evidence of mass lesion, intracranial hemorrhage or evidence of cortically based acute infarction. Vascular: Calcified atherosclerosis at the skull base. Skull: Motion artifact at the level of the anterior cranial fossa. No acute osseous abnormality identified.  Cervical spine is detailed separately. Sinuses/Orbits: Visualized paranasal sinuses and mastoids are clear. Other: No orbit or scalp soft tissue injury identified. IMPRESSION: 1. Mildly degraded by motion. No acute traumatic injury identified. Stable non contrast CT appearance of the brain. 2. Cervical Spine CT is detailed separately. Electronically Signed   By: Odessa Fleming M.D.   On: 01/29/2021 09:20   CT Cervical Spine Wo Contrast  Result Date: 01/29/2021 CLINICAL DATA:  87 year old male status post fall. EXAM: CT CERVICAL SPINE WITHOUT CONTRAST TECHNIQUE: Multidetector CT imaging of the cervical spine was performed without intravenous contrast. Multiplanar CT image reconstructions were also generated. COMPARISON:  12/14/2020. FINDINGS: Alignment: Chronic straightening of cervical lordosis. Mild reversal today. But subtle anterolisthesis of C2 on C3 and C7 on T1 appears stable. Stable posterior element alignment. Skull base and vertebrae: Visualized skull base is intact. No atlanto-occipital dissociation. C1 and C2 appear intact and aligned. No acute osseous abnormality identified. Soft tissues and spinal canal: No prevertebral fluid or swelling. No visible canal hematoma. Stable visible noncontrast neck soft tissues. Disc levels: Chronically  advanced cervical spine degeneration appears stable since September. Advanced chronic facet arthropathy on the right at C2-C3. Chronic degenerative spinal stenosis at C2-C3 and C3-C4.Evidence of developing facet ankylosis on the left at C7-T1. Upper chest: Visible upper thoracic levels appear intact. Negative visible lung apices. IMPRESSION: 1. No acute traumatic injury identified in the cervical spine. 2. Chronically advanced cervical spine degeneration appears stable since September including chronic spinal stenosis at both C2-C3 and C3-C4. Electronically Signed   By: Odessa Fleming M.D.   On: 01/29/2021 09:25   DG Chest Portable 1 View  Result Date: 01/29/2021 CLINICAL  DATA:  fall on thinners EXAM: PORTABLE CHEST 1 VIEW COMPARISON:  January 08, 2021 FINDINGS: The cardiomediastinal silhouette is unchanged in contour.Tortuous thoracic aorta. Atherosclerotic calcifications. No pleural effusion. No pneumothorax. No acute pleuroparenchymal abnormality. Visualized abdomen is unremarkable. Known L1 compression fracture is not well visualized with AP technique. IMPRESSION: No acute cardiopulmonary abnormality. Electronically Signed   By: Meda Klinefelter M.D.   On: 01/29/2021 09:49   CT Renal Stone Study  Result Date: 01/29/2021 CLINICAL DATA:  Headache and back pain after unwitnessed fall. On blood thinners. Hematuria. EXAM: CT ABDOMEN AND PELVIS WITHOUT CONTRAST TECHNIQUE: Multidetector CT imaging of the abdomen and pelvis was performed following the standard protocol without IV contrast. COMPARISON:  Abdominopelvic CT 01/02/2021 and 12/14/2020. FINDINGS: Lower chest: Previously demonstrated small right pleural effusion has resolved. The lung bases are clear. Hepatobiliary: No focal hepatic abnormalities are identified on noncontrast imaging. Faint dependent increased density within the gallbladder lumen is similar to the previous study, and likely indicative of sludge. No calcified gallstones, gallbladder Ortlieb thickening or biliary dilatation identified. Pancreas: Unremarkable. No pancreatic ductal dilatation or surrounding inflammatory changes. Spleen: Normal in size without focal abnormality. Adrenals/Urinary Tract: Both adrenal glands appear normal. Interval fragmentation of the dominant calculus at the right ureterovesical junction and removal of the right ureteral stent. There are multiple small stone fragments in the distal right ureter. There are probable stone fragments dependently within the massively dilated right intrarenal collecting system. The degree of dilatation is unchanged from the recent prior study, and there is associated severe right renal cortical thinning.  This collecting system dilatation has increased from 12/14/2020. Interval increased density within a dilated right lower pole calyx, likely reflecting hemorrhage (measuring 4.8 cm on image 40/4). Left renal cortical scarring and milder dilatation of the left renal pelvis and left ureter are unchanged. The bladder is trabeculated. A Foley catheter is in place, now extending into the bladder lumen and accounting for air in the bladder lumen. Stomach/Bowel: No enteric contrast administered. The stomach appears unremarkable for its degree of distension. No evidence of bowel Harpenau thickening, distention or surrounding inflammatory change. Diverticular changes again noted throughout the descending and sigmoid colon. Vascular/Lymphatic: There are no enlarged abdominal or pelvic lymph nodes. Scattered aortic and branch vessel atherosclerosis. Reproductive: Stable moderate asymmetric prostatomegaly, greater on the left. Other: No evidence of ascites, free air or focal extraluminal fluid collection. Mild generalized soft tissue edema. Musculoskeletal: No acute or significant osseous findings. Stable superior endplate compression deformity at L1, chronic. IMPRESSION: 1. Interval fragmentation of dominant obstructing calculus in the distal right ureter and removal of the right ureteral stent. There are multiple small stone fragments in the distal right ureter with persistent marked dilatation of the right renal pelvis and right ureter. This collecting system dilatation has increased from 12/14/2020 and implies persistent distal ureteral obstruction. 2. Interval increased density within a dilated calyx in the lower  pole of the right kidney, most likely representing hemorrhage. 3. Foley catheter has been repositioned in the bladder lumen. The bladder is largely decompressed. 4. Distal colonic diverticulosis without evidence of acute inflammation. 5.  Aortic Atherosclerosis (ICD10-I70.0). Electronically Signed   By: Carey Bullocks  M.D.   On: 01/29/2021 14:28   DG HIP UNILAT WITH PELVIS 2-3 VIEWS LEFT  Result Date: 01/29/2021 CLINICAL DATA:  87 year old male status post fall on Eliquis. EXAM: DG HIP (WITH OR WITHOUT PELVIS) 2-3V RIGHT; DG HIP (WITH OR WITHOUT PELVIS) 2-3V LEFT COMPARISON:  CT Abdomen and Pelvis 01/02/2021. FINDINGS: A catheter projects just to the right of the pelvic midline at the pubic rami, probably a urethral catheter. Femoral heads normally located. Pelvis appears intact. Bone mineralization is within normal limits for age. Proximal right femur appears intact. Proximal left femur appears intact. Symmetric SI joints. No acute osseous abnormality identified. Negative visible bowel gas. IMPRESSION: No acute fracture or dislocation identified about the bilateral hips or pelvis. Electronically Signed   By: Odessa Fleming M.D.   On: 01/29/2021 11:20   DG HIP UNILAT WITH PELVIS 2-3 VIEWS RIGHT  Result Date: 01/29/2021 CLINICAL DATA:  87 year old male status post fall on Eliquis. EXAM: DG HIP (WITH OR WITHOUT PELVIS) 2-3V RIGHT; DG HIP (WITH OR WITHOUT PELVIS) 2-3V LEFT COMPARISON:  CT Abdomen and Pelvis 01/02/2021. FINDINGS: A catheter projects just to the right of the pelvic midline at the pubic rami, probably a urethral catheter. Femoral heads normally located. Pelvis appears intact. Bone mineralization is within normal limits for age. Proximal right femur appears intact. Proximal left femur appears intact. Symmetric SI joints. No acute osseous abnormality identified. Negative visible bowel gas. IMPRESSION: No acute fracture or dislocation identified about the bilateral hips or pelvis. Electronically Signed   By: Odessa Fleming M.D.   On: 01/29/2021 11:20        Scheduled Meds:  feeding supplement  237 mL Oral BID BM   finasteride  5 mg Oral QHS   tamsulosin  0.4 mg Oral Daily   Continuous Infusions:  sodium bicarbonate in 0.45 NS mL infusion 100 mL/hr at 01/30/21 0804     LOS: 1 day     Huey Bienenstock,  MD [facility]   To contact the attending provider between 7A-7P or the covering provider during after hours 7P-7A, please log into the web site [email or web address] and access using universal [place] password for that web site. If you do not have the password, please call the hospital operator.  01/30/2021, 1:04 PM

## 2021-01-31 DIAGNOSIS — R319 Hematuria, unspecified: Secondary | ICD-10-CM | POA: Diagnosis not present

## 2021-01-31 DIAGNOSIS — N179 Acute kidney failure, unspecified: Secondary | ICD-10-CM | POA: Diagnosis not present

## 2021-01-31 DIAGNOSIS — R4182 Altered mental status, unspecified: Secondary | ICD-10-CM | POA: Diagnosis not present

## 2021-01-31 LAB — MAGNESIUM: Magnesium: 1.6 mg/dL — ABNORMAL LOW (ref 1.7–2.4)

## 2021-01-31 LAB — BASIC METABOLIC PANEL
Anion gap: 7 (ref 5–15)
BUN: 33 mg/dL — ABNORMAL HIGH (ref 8–23)
CO2: 25 mmol/L (ref 22–32)
Calcium: 8 mg/dL — ABNORMAL LOW (ref 8.9–10.3)
Chloride: 108 mmol/L (ref 98–111)
Creatinine, Ser: 1.25 mg/dL — ABNORMAL HIGH (ref 0.61–1.24)
GFR, Estimated: 55 mL/min — ABNORMAL LOW (ref >60)
Glucose, Bld: 100 mg/dL — ABNORMAL HIGH (ref 70–99)
Potassium: 3.7 mmol/L (ref 3.5–5.1)
Sodium: 140 mmol/L (ref 135–145)

## 2021-01-31 LAB — CBC
HCT: 26.3 % — ABNORMAL LOW (ref 39.0–52.0)
Hemoglobin: 8.3 g/dL — ABNORMAL LOW (ref 13.0–17.0)
MCH: 27.1 pg (ref 26.0–34.0)
MCHC: 31.6 g/dL (ref 30.0–36.0)
MCV: 85.9 fL (ref 80.0–100.0)
Platelets: 270 10*3/uL (ref 150–400)
RBC: 3.06 MIL/uL — ABNORMAL LOW (ref 4.22–5.81)
RDW: 17.3 % — ABNORMAL HIGH (ref 11.5–15.5)
WBC: 5.8 10*3/uL (ref 4.0–10.5)
nRBC: 0 % (ref 0.0–0.2)

## 2021-01-31 LAB — TYPE AND SCREEN
ABO/RH(D): A POS
Antibody Screen: NEGATIVE
Unit division: 0

## 2021-01-31 LAB — GLUCOSE, CAPILLARY: Glucose-Capillary: 82 mg/dL (ref 70–99)

## 2021-01-31 LAB — BPAM RBC
Blood Product Expiration Date: 202210282359
ISSUE DATE / TIME: 202210241025
Unit Type and Rh: 600

## 2021-01-31 LAB — PHOSPHORUS: Phosphorus: 3.3 mg/dL (ref 2.5–4.6)

## 2021-01-31 MED ORDER — MAGNESIUM SULFATE 2 GM/50ML IV SOLN
2.0000 g | Freq: Once | INTRAVENOUS | Status: AC
  Administered 2021-01-31: 2 g via INTRAVENOUS
  Filled 2021-01-31: qty 50

## 2021-01-31 MED ORDER — THIAMINE HCL 100 MG PO TABS
100.0000 mg | ORAL_TABLET | Freq: Every day | ORAL | Status: DC
  Administered 2021-01-31 – 2021-02-08 (×9): 100 mg via ORAL
  Filled 2021-01-31 (×9): qty 1

## 2021-01-31 NOTE — Plan of Care (Signed)
  Problem: Education: Goal: Knowledge of General Education information will improve Description Including pain rating scale, medication(s)/side effects and non-pharmacologic comfort measures Outcome: Progressing   Problem: Health Behavior/Discharge Planning: Goal: Ability to manage health-related needs will improve Outcome: Progressing   

## 2021-01-31 NOTE — Progress Notes (Addendum)
PROGRESS NOTE    Corey Cummings  EQA:[id number] DOB: Jul 20, 1930 DOA: 01/29/2021 PCP: [name], MD   Chief Complaint  Patient presents with   Fall   Level 2    Brief Narrative:   Corey Cummings is a 87 y.o. male with medical history significant of BPH with chronic indwelling Foley catheter, obstructive renal stone status post stenting, HTN, PE November 21 on Eliquis, chronic iron deficiency anemia on iron supplement, was sent from nursing home for altered mentation and fall.  Patient was noted to be confused in ED, his work-up was significant for Meta urea, with urinary retention with clogged Foley catheter, AKI with a creatinine of 2.4, hematuria improved with  Assessment & Plan:   Active Problems:   BPH (benign prostatic hyperplasia)   Chronic indwelling Foley catheter   Acute metabolic encephalopathy   Essential hypertension   AKI (acute kidney injury) (HCC)    Gross hematuria -Multifactorial due to large prostate, anticoagulation and catheter trauma -Med per urology, continue to hold anticoagulation for another 24 hours, -Irrigate every 6 hours, and as needed for clots -Patient with hydronephrosis on imaging, is chronic, and should improve as catheter is draining now.  No indication for ureteral stent presently. -Continue with Flomax and proscar -Continue to monitor bladder scan to ensure no recurrent retention.   AKI -Probably multifactorial, prerenal, postrenal, in the setting of obstructive uropathy from clogged Foley catheter, and poor oral intake/appetite from his illness and encephalopathy.  . -Creatinine is improving, continue with IV fluids, monitor closely for obstructive uropathy and ensure appropriate draining Foley catheter.   -Bicarb has normalized, drip has been stopped -Continue with IV fluid as remains n.p.o. till he is able to swallow his speech evaluation  Acute blood loss anemia -Hemoglobin is 7.2 on admission, received 1 unit PRBC with good  response -Continue to hold anticoagulation for 24 hours, resume in a.m.  History of PE -continue To hold Eliquis due to significant hematuria, resume on anticoagulation tomorrow if hematuria resolves and H&H remained stable.  Acute metabolic encephalopathy -Likely from AKI, mild uremia, mentation has improved, not back to baseline, he is more awake today, so we will request SLP evaluation to see if he is able to eat.    Hypomagnesemia -Repleted   Hyperkalemia -Resolved with lokelma   Leukocytosis -Recent UTI, completed 7 days of p.o. antibiotics recently. -Probably related to the AKI and acute hematuria/blood loss, will monitor off antibiotics for now.   HTN -Hold home BP medication, as needed hydralazine for now.   BPH, chronic ureteral obstruction on indwelling Foley -Foley was exchanged on admission..  DVT prophylaxis: SCD's, resume Eliquis 10/26 of no gross hematuria and H&H is stable. Code Status: Full Family Communication: discussed with daughter [name] at bedside on 10/24, none at bedside today. Disposition:   Status is: Inpatient  Remains inpatient appropriate because: Mentation not back to baseline yet, and remains with some  hematuria Disposition: Likely will need to go back to SNF for subacute rehab once medically stable.         Consultants:  urolgoy   Subjective:  No significant events overnight as discussed with staff, this morning patient himself denies any complaints. Objective: Vitals:   01/30/21 1400 01/30/21 1430 01/30/21 1515 01/30/21 2005  BP: (!) 127/55 (!) 106/51 139/62 133/62  Pulse: (!) 59 (!) 58 70   Resp: 15 13 12 19   Temp:    98.6 F (37 C)  TempSrc:    Axillary  SpO2: 98% 99% 99%  Weight:      Height:        Intake/Output Summary (Last 24 hours) at 01/31/2021 1044 Last data filed at 01/31/2021 1023 Gross per 24 hour  Intake 1744.91 ml  Output 1800 ml  Net -55.09 ml   Filed Weights   01/29/21 0901  Weight: 56.7 kg     Examination:  SHEENT is frail, chronically ill-appearing, but he is more awake and appropriate today, communicative, interactive, remains confused though.   Symmetrical Chest Legler movement, Good air movement bilaterally, CTAB RRR,No Gallops,Rubs or new Murmurs, No Parasternal Heave +ve B.Sounds, Abd Soft, No tenderness, Foley present, with small amount of edema red color urine in Foley bag. No Cyanosis, Clubbing or edema, No new Rash or bruise     Data Reviewed: I have personally reviewed following labs and imaging studies  CBC: Recent Labs  Lab 01/29/21 0900 01/29/21 1759 01/30/21 0421 01/30/21 1911 01/31/21 0141  WBC 13.7*  --  6.9 6.4 5.8  NEUTROABS  --   --  5.1  --   --   HGB 9.2* 8.0* 7.2* 8.9* 8.3*  HCT 30.0* 26.7* 24.0* 28.1* 26.3*  MCV 87.0  --  87.0 85.4 85.9  PLT 314  --  241 280 270    Basic Metabolic Panel: Recent Labs  Lab 01/29/21 0900 01/29/21 1759 01/30/21 0421 01/31/21 0141  NA 138 139 140 140  K 5.9* 4.6 4.1 3.7  CL 108 111 110 108  CO2 21* 21* 25 25  GLUCOSE 161* 113* 91 100*  BUN 56* 51* 45* 33*  CREATININE 2.43* 1.90* 1.57* 1.25*  CALCIUM 9.1 8.3* 8.1* 8.0*  MG  --   --   --  1.6*  PHOS  --   --   --  3.3    GFR: Estimated Creatinine Clearance: 32.8 mL/min (A) (by C-G formula based on SCr of 1.25 mg/dL (H)).  Liver Function Tests: No results for input(s): AST, ALT, ALKPHOS, BILITOT, PROT, ALBUMIN in the last 168 hours.  CBG: Recent Labs  Lab 01/30/21 2128  GLUCAP 82     Recent Results (from the past 240 hour(s))  Resp Panel by RT-PCR (Flu A&B, Covid) Nasopharyngeal Swab     Status: None   Collection Time: 01/29/21 11:01 AM   Specimen: Nasopharyngeal Swab; Nasopharyngeal(NP) swabs in vial transport medium  Result Value Ref Range Status   SARS Coronavirus 2 by RT PCR NEGATIVE NEGATIVE Final    Comment: (NOTE) SARS-CoV-2 target nucleic acids are NOT DETECTED.  The SARS-CoV-2 RNA is generally detectable in upper  respiratory specimens during the acute phase of infection. The lowest concentration of SARS-CoV-2 viral copies this assay can detect is 138 copies/mL. A negative result does not preclude SARS-Cov-2 infection and should not be used as the sole basis for treatment or other patient management decisions. A negative result may occur with  improper specimen collection/handling, submission of specimen other than nasopharyngeal swab, presence of viral mutation(s) within the areas targeted by this assay, and inadequate number of viral copies(<138 copies/mL). A negative result must be combined with clinical observations, patient history, and epidemiological information. The expected result is Negative.  Fact Sheet for Patients:  BloggerCourse.com  Fact Sheet for Healthcare Providers:  SeriousBroker.it  This test is no t yet approved or cleared by the [place] FDA and  has been authorized for detection and/or diagnosis of SARS-CoV-2 by FDA under an Emergency Use Authorization (EUA). This EUA will remain  in effect (meaning this test can be  used) for the duration of the COVID-19 declaration under Section 564(b)(1) of the Act, 21 U.S.C.section 360bbb-3(b)(1), unless the authorization is terminated  or revoked sooner.       Influenza A by PCR NEGATIVE NEGATIVE Final   Influenza B by PCR NEGATIVE NEGATIVE Final    Comment: (NOTE) The Xpert Xpress SARS-CoV-2/FLU/RSV plus assay is intended as an aid in the diagnosis of influenza from Nasopharyngeal swab specimens and should not be used as a sole basis for treatment. Nasal washings and aspirates are unacceptable for Xpert Xpress SARS-CoV-2/FLU/RSV testing.  Fact Sheet for Patients: BloggerCourse.com  Fact Sheet for Healthcare Providers: SeriousBroker.it  This test is not yet approved or cleared by the [place] FDA and has been  authorized for detection and/or diagnosis of SARS-CoV-2 by FDA under an Emergency Use Authorization (EUA). This EUA will remain in effect (meaning this test can be used) for the duration of the COVID-19 declaration under Section 564(b)(1) of the Act, 21 U.S.C. section 360bbb-3(b)(1), unless the authorization is terminated or revoked.  Performed at [facility], [address], [place], [place] [address]   Urine Culture     Status: Abnormal   Collection Time: 01/29/21 11:45 AM   Specimen: Urine, Catheterized  Result Value Ref Range Status   Specimen Description URINE, CATHETERIZED  Final   Special Requests NONE  Final   Culture (A)  Final    <10,000 COLONIES/mL INSIGNIFICANT GROWTH Performed at [facility], [address], [place], [place] [address]    Report Status 01/30/2021 FINAL  Final         Radiology Studies: DG Elbow Complete Left  Result Date: 01/29/2021 CLINICAL DATA:  87 year old male status post fall on Eliquis. EXAM: LEFT ELBOW - COMPLETE 3+ VIEW COMPARISON:  None. FINDINGS: Incidental antecubital fossa IV artifact. Chronic joint space loss, degenerative spurring and chronic Medial condyle fragmentation about the elbow. No joint effusion, acute fracture or dislocation identified. IMPRESSION: Degenerative changes.  No acute fracture or dislocation identified. Electronically Signed   By: Odessa Fleming M.D.   On: 01/29/2021 11:19   CT Renal Stone Study  Result Date: 01/29/2021 CLINICAL DATA:  Headache and back pain after unwitnessed fall. On blood thinners. Hematuria. EXAM: CT ABDOMEN AND PELVIS WITHOUT CONTRAST TECHNIQUE: Multidetector CT imaging of the abdomen and pelvis was performed following the standard protocol without IV contrast. COMPARISON:  Abdominopelvic CT 01/02/2021 and 12/14/2020. FINDINGS: Lower chest: Previously demonstrated small right pleural effusion has resolved. The lung bases are clear. Hepatobiliary: No focal hepatic abnormalities are  identified on noncontrast imaging. Faint dependent increased density within the gallbladder lumen is similar to the previous study, and likely indicative of sludge. No calcified gallstones, gallbladder Gavina thickening or biliary dilatation identified. Pancreas: Unremarkable. No pancreatic ductal dilatation or surrounding inflammatory changes. Spleen: Normal in size without focal abnormality. Adrenals/Urinary Tract: Both adrenal glands appear normal. Interval fragmentation of the dominant calculus at the right ureterovesical junction and removal of the right ureteral stent. There are multiple small stone fragments in the distal right ureter. There are probable stone fragments dependently within the massively dilated right intrarenal collecting system. The degree of dilatation is unchanged from the recent prior study, and there is associated severe right renal cortical thinning. This collecting system dilatation has increased from 12/14/2020. Interval increased density within a dilated right lower pole calyx, likely reflecting hemorrhage (measuring 4.8 cm on image 40/4). Left renal cortical scarring and milder dilatation of the left renal pelvis and left ureter  are unchanged. The bladder is trabeculated. A Foley catheter is in place, now extending into the bladder lumen and accounting for air in the bladder lumen. Stomach/Bowel: No enteric contrast administered. The stomach appears unremarkable for its degree of distension. No evidence of bowel Carneiro thickening, distention or surrounding inflammatory change. Diverticular changes again noted throughout the descending and sigmoid colon. Vascular/Lymphatic: There are no enlarged abdominal or pelvic lymph nodes. Scattered aortic and branch vessel atherosclerosis. Reproductive: Stable moderate asymmetric prostatomegaly, greater on the left. Other: No evidence of ascites, free air or focal extraluminal fluid collection. Mild generalized soft tissue edema. Musculoskeletal: No  acute or significant osseous findings. Stable superior endplate compression deformity at L1, chronic. IMPRESSION: 1. Interval fragmentation of dominant obstructing calculus in the distal right ureter and removal of the right ureteral stent. There are multiple small stone fragments in the distal right ureter with persistent marked dilatation of the right renal pelvis and right ureter. This collecting system dilatation has increased from 12/14/2020 and implies persistent distal ureteral obstruction. 2. Interval increased density within a dilated calyx in the lower pole of the right kidney, most likely representing hemorrhage. 3. Foley catheter has been repositioned in the bladder lumen. The bladder is largely decompressed. 4. Distal colonic diverticulosis without evidence of acute inflammation. 5.  Aortic Atherosclerosis (ICD10-I70.0). Electronically Signed   By: Carey Bullocks M.D.   On: 01/29/2021 14:28   DG HIP UNILAT WITH PELVIS 2-3 VIEWS LEFT  Result Date: 01/29/2021 CLINICAL DATA:  87 year old male status post fall on Eliquis. EXAM: DG HIP (WITH OR WITHOUT PELVIS) 2-3V RIGHT; DG HIP (WITH OR WITHOUT PELVIS) 2-3V LEFT COMPARISON:  CT Abdomen and Pelvis 01/02/2021. FINDINGS: A catheter projects just to the right of the pelvic midline at the pubic rami, probably a urethral catheter. Femoral heads normally located. Pelvis appears intact. Bone mineralization is within normal limits for age. Proximal right femur appears intact. Proximal left femur appears intact. Symmetric SI joints. No acute osseous abnormality identified. Negative visible bowel gas. IMPRESSION: No acute fracture or dislocation identified about the bilateral hips or pelvis. Electronically Signed   By: Odessa Fleming M.D.   On: 01/29/2021 11:20   DG HIP UNILAT WITH PELVIS 2-3 VIEWS RIGHT  Result Date: 01/29/2021 CLINICAL DATA:  87 year old male status post fall on Eliquis. EXAM: DG HIP (WITH OR WITHOUT PELVIS) 2-3V RIGHT; DG HIP (WITH OR WITHOUT  PELVIS) 2-3V LEFT COMPARISON:  CT Abdomen and Pelvis 01/02/2021. FINDINGS: A catheter projects just to the right of the pelvic midline at the pubic rami, probably a urethral catheter. Femoral heads normally located. Pelvis appears intact. Bone mineralization is within normal limits for age. Proximal right femur appears intact. Proximal left femur appears intact. Symmetric SI joints. No acute osseous abnormality identified. Negative visible bowel gas. IMPRESSION: No acute fracture or dislocation identified about the bilateral hips or pelvis. Electronically Signed   By: Odessa Fleming M.D.   On: 01/29/2021 11:20        Scheduled Meds:  Chlorhexidine Gluconate Cloth  6 each Topical Daily   feeding supplement  237 mL Oral BID BM   finasteride  5 mg Oral QHS   mouth rinse  15 mL Mouth Rinse BID   tamsulosin  0.4 mg Oral Daily   Continuous Infusions:  dextrose 5 % and 0.45% NaCl 75 mL/hr at 01/31/21 0523     LOS: 2 days     Huey Bienenstock, MD [facility]   To contact the attending provider between 7A-7P or  the covering provider during after hours 7P-7A, please log into the web site [email or web address] and access using universal [place] password for that web site. If you do not have the password, please call the hospital operator.  01/31/2021, 10:44 AM

## 2021-01-31 NOTE — Evaluation (Signed)
Clinical/Bedside Swallow Evaluation Patient Details  Name: Corey Cummings MRN: [id number] Date of Birth: Jul 18, 1930  Today's Date: 01/31/2021 Time: SLP Start Time (ACUTE ONLY): 1313 SLP Stop Time (ACUTE ONLY): 1345 SLP Time Calculation (min) (ACUTE ONLY): 32 min  Past Medical History:  Past Medical History:  Diagnosis Date   BPH (benign prostatic hyperplasia)    Hypertension    Past Surgical History:  Past Surgical History:  Procedure Laterality Date   APPENDECTOMY     CYSTOSCOPY W/ URETERAL STENT PLACEMENT Right 12/04/2020   Procedure: CYSTOSCOPY WITH RETROGRADE PYELOGRAM/URETERAL STENT PLACEMENT;  Surgeon: Jannifer Hick, MD;  Location: [facility];  Service: Urology;  Laterality: Right;   CYSTOSCOPY W/ URETERAL STENT PLACEMENT Right 01/03/2021   Procedure: CYSTOSCOPY LITHOTRIPSY WITH RETROGRADE PYELOGRAM/ STENT REMOVAL;  Surgeon: Noel Christmas, MD;  Location: [facility];  Service: Urology;  Laterality: Right;   HPI:  87 y.o. male with medical history significant of BPH with chronic indwelling Foley catheter, obstructive renal stone status post stenting, HTN, PE November 21 on Eliquis, chronic iron deficiency anemia on iron supplement, was sent from nursing home for altered mentation and fall.  Patient was noted to be confused in ED, his work-up was significant for Meta urea, with urinary retention with clogged Foley catheter, AKI with a creatinine of 2.4, hematuria found to have Acute metabolic encephalopathy.    Assessment / Plan / Recommendation  Clinical Impression  Pt presents with s/sx of possible pharyngeal dysphagia component. Oral dysphagia also exhibited as pt with edentulous status. He states at baseline utilizing upper and lower dentures though unsure if reliable historian. Pt with signifcant xerostomia of oral cavity including dried palatal secretions. Diligent oral care by SLP improved oral hygiene greatly including removal of dried secretions. Pt with immediate overt cough with  thin liquids by straw on initial trial of thins (pt consistently requesting use of straw with liquid consumption). Subsequent trials of thin liquids were without overt coughing however intermittent wet vocal quality exhibited and pt with reduced laryngeal elevation per palpation as well as mulitple swallows suggesting reduced swallowing efficiency. Post swallow belching also frequently observed. No overt coughing with nectar thick liquids however trace wet vocal quality persisted. Recommend dysphagia 2 (finely chopped) and thin liquids with meds in puree and follow up MBSS next date to further assess swallowing safety and efficiency. Pt and RN in agreement with plan.  SLP Visit Diagnosis: Dysphagia, oral phase (R13.11);Dysphagia, unspecified (R13.10)    Aspiration Risk  Moderate aspiration risk;Mild aspiration risk    Diet Recommendation  Dysphagia 2 (finely chopped) thin liquids   Medication Administration: Whole meds with puree    Other  Recommendations Oral Care Recommendations: Oral care BID    Recommendations for follow up therapy are one component of a multi-disciplinary discharge planning process, led by the attending physician.  Recommendations may be updated based on patient status, additional functional criteria and insurance authorization.  Follow up Recommendations 24 hour supervision/assistance;Skilled Nursing facility      Frequency and Duration min 2x/week  2 weeks       Prognosis Prognosis for Safe Diet Advancement: Good Barriers to Reach Goals: Cognitive deficits      Swallow Study   General Date of Onset: 01/29/21 HPI: 87 y.o. male with medical history significant of BPH with chronic indwelling Foley catheter, obstructive renal stone status post stenting, HTN, PE November 21 on Eliquis, chronic iron deficiency anemia on iron supplement, was sent from nursing home for altered mentation and fall.  Patient  was noted to be confused in ED, his work-up was significant for  Meta urea, with urinary retention with clogged Foley catheter, AKI with a creatinine of 2.4, hematuria found to have Acute metabolic encephalopathy. Type of Study: Bedside Swallow Evaluation Previous Swallow Assessment: none on file Diet Prior to this Study: NPO Temperature Spikes Noted: No Respiratory Status: Room air History of Recent Intubation: No Behavior/Cognition: Alert;Cooperative;Pleasant mood Oral Cavity Assessment: Dry;Dried secretions Oral Care Completed by SLP: Yes Oral Cavity - Dentition: Edentulous;Dentures, not available (reports at baseline utilizing dentures) Vision: Functional for self-feeding Self-Feeding Abilities: Needs set up Patient Positioning: Upright in bed Baseline Vocal Quality: Low vocal intensity Volitional Cough: Strong Volitional Swallow: Able to elicit    Oral/Motor/Sensory Function Overall Oral Motor/Sensory Function: Generalized oral weakness   Ice Chips Ice chips: Impaired Presentation: Spoon Oral Phase Impairments: Reduced lingual movement/coordination Oral Phase Functional Implications: Prolonged oral transit Pharyngeal Phase Impairments: Suspected delayed Swallow;Decreased hyoid-laryngeal movement;Multiple swallows   Thin Liquid Thin Liquid: Impaired Presentation: Straw;Cup Pharyngeal  Phase Impairments: Suspected delayed Swallow;Multiple swallows;Cough - Immediate;Cough - Delayed;Decreased hyoid-laryngeal movement;Wet Vocal Quality    Nectar Thick Nectar Thick Liquid: Impaired Presentation: Straw Pharyngeal Phase Impairments: Suspected delayed Swallow;Decreased hyoid-laryngeal movement;Wet Vocal Quality   Honey Thick Honey Thick Liquid: Not tested   Puree Puree: Within functional limits Presentation: Spoon   Solid     Solid: Impaired Presentation: Self Fed Oral Phase Impairments: Impaired mastication Oral Phase Functional Implications: Oral residue;Prolonged oral transit;Impaired mastication Pharyngeal Phase Impairments: Multiple  swallows;Decreased hyoid-laryngeal movement;Suspected delayed Swallow      Corey Cummings H. MA, CCC-SLP Acute Rehabilitation Services   01/31/2021,2:02 PM

## 2021-01-31 NOTE — Consult Note (Addendum)
WOC Nurse Consult Note: Patient receiving care in [facility] 2W26 Reason for Consult: Sacral wound Wound type: Stage 3 coccyx PI Pressure Injury POA: Yes Measurement: 2.6 cm x 1 cm x 0.1 cm Wound bed: Yellow Drainage (amount, consistency, odor) Sanguinous drainage on foam dressing Periwound: macerated around the wound Dressing procedure/placement/frequency: Clean the sacral area with no rinse cleanser. Apply a cut to fit piece of Aquacel Advantage [place] # [phone]) over the wound and secure with a sacral foam dressing with tip up to prevent soiling. Change the Aquacel Daily.  Monitor the wound area(s) for worsening of condition such as: Signs/symptoms of infection, increase in size, development of or worsening of odor, development of pain, or increased pain at the affected locations.   Notify the medical team if any of these develop.  Pressure Injury Prevention Bundle May use any that apply to this patient. Support surfaces (air mattress) chair cushion [place] # [phone]) Heel offloading boots [place] # [phone]) Turning and Positioning  Measures to reduce shear (draw sheet, knees up) Skin protection Products (Foam dressing) Moisture management products (Critic-Aid Barrier Cream (Purple top) Sween moisturizing lotion (Pink top in clean supply) Nutrition Management Protection for Medical Devices Routine Skin Assessment   Thank you for the consult. WOC nurse will not follow at this time.   Please re-consult the WOC team if needed.  Renaldo Reel Katrinka Blazing, MSN, RN, CMSRN, Angus Seller, [facility] Wound Treatment Associate Pager [phone]

## 2021-01-31 NOTE — TOC CAGE-AID Note (Signed)
Transition of Care [facility]) - CAGE-AID Screening   Patient Details  Name: Corey Cummings MRN: [id number] Date of Birth: [birth year]  Transition of Care [facility]) CM/SW Contact:    Avaiah Stempel C Tarpley-Carter, LCSWA Phone Number: 01/31/2021, 9:41 AM   Clinical Narrative: Pt is unable to participate in Cage Aid.  Quintana Canelo Tarpley-Carter, MSW, LCSW-A Pronouns:  She/Her/Hers [facility] Clinical Social Worker Direct Number:  [phone] [email or web address]  CAGE-AID Screening: Substance Abuse Screening unable to be completed due to: : Patient unable to participate             Substance Abuse Education Offered: No

## 2021-02-01 ENCOUNTER — Inpatient Hospital Stay (HOSPITAL_COMMUNITY): Payer: Medicare HMO

## 2021-02-01 DIAGNOSIS — G9341 Metabolic encephalopathy: Secondary | ICD-10-CM | POA: Diagnosis not present

## 2021-02-01 DIAGNOSIS — Z978 Presence of other specified devices: Secondary | ICD-10-CM | POA: Diagnosis not present

## 2021-02-01 DIAGNOSIS — N179 Acute kidney failure, unspecified: Secondary | ICD-10-CM | POA: Diagnosis not present

## 2021-02-01 LAB — CBC
HCT: 21.2 % — ABNORMAL LOW (ref 39.0–52.0)
HCT: 26.7 % — ABNORMAL LOW (ref 39.0–52.0)
Hemoglobin: 6.3 g/dL — CL (ref 13.0–17.0)
Hemoglobin: 8.6 g/dL — ABNORMAL LOW (ref 13.0–17.0)
MCH: 27.3 pg (ref 26.0–34.0)
MCH: 27.5 pg (ref 26.0–34.0)
MCHC: 29.7 g/dL — ABNORMAL LOW (ref 30.0–36.0)
MCHC: 32.2 g/dL (ref 30.0–36.0)
MCV: 91.8 fL (ref 80.0–100.0)
MCV: 85.3 fL (ref 80.0–100.0)
Platelets: 189 10*3/uL (ref 150–400)
Platelets: 246 10*3/uL (ref 150–400)
RBC: 2.31 MIL/uL — ABNORMAL LOW (ref 4.22–5.81)
RBC: 3.13 MIL/uL — ABNORMAL LOW (ref 4.22–5.81)
RDW: 17.8 % — ABNORMAL HIGH (ref 11.5–15.5)
RDW: 17.2 % — ABNORMAL HIGH (ref 11.5–15.5)
WBC: 5 10*3/uL (ref 4.0–10.5)
WBC: 6.2 10*3/uL (ref 4.0–10.5)
nRBC: 0 % (ref 0.0–0.2)
nRBC: 0 % (ref 0.0–0.2)

## 2021-02-01 LAB — BASIC METABOLIC PANEL
Anion gap: 6 (ref 5–15)
BUN: 21 mg/dL (ref 8–23)
CO2: 26 mmol/L (ref 22–32)
Calcium: 8.2 mg/dL — ABNORMAL LOW (ref 8.9–10.3)
Chloride: 106 mmol/L (ref 98–111)
Creatinine, Ser: 0.98 mg/dL (ref 0.61–1.24)
GFR, Estimated: >60 mL/min (ref >60)
Glucose, Bld: 145 mg/dL — ABNORMAL HIGH (ref 70–99)
Potassium: 3.9 mmol/L (ref 3.5–5.1)
Sodium: 138 mmol/L (ref 135–145)

## 2021-02-01 LAB — MAGNESIUM: Magnesium: 1.7 mg/dL (ref 1.7–2.4)

## 2021-02-01 MED ORDER — ADULT MULTIVITAMIN W/MINERALS CH
1.0000 | ORAL_TABLET | Freq: Every day | ORAL | Status: DC
  Administered 2021-02-01 – 2021-02-08 (×8): 1 via ORAL
  Filled 2021-02-01 (×8): qty 1

## 2021-02-01 MED ORDER — APIXABAN 5 MG PO TABS
5.0000 mg | ORAL_TABLET | Freq: Two times a day (BID) | ORAL | Status: DC
  Administered 2021-02-01 (×2): 5 mg via ORAL
  Filled 2021-02-01 (×2): qty 1

## 2021-02-01 MED ORDER — ENSURE ENLIVE PO LIQD
237.0000 mL | Freq: Three times a day (TID) | ORAL | Status: DC
  Administered 2021-02-01 – 2021-02-08 (×19): 237 mL via ORAL

## 2021-02-01 NOTE — TOC Initial Note (Signed)
Transition of Care [facility]) - Initial/Assessment Note    Patient Details  Name: Corey Cummings MRN: [id number] Date of Birth: 07-Jan-1931  [facility]) CM/SW Contact:    Beckie Busing, RN Phone Number:[phone]  02/01/2021, 9:37 AM  Clinical Narrative:                 [facility] consulted for patient admitted from [facility]. CM verified with [facility] that patient is able to return and facility will initiate auth when appropriate. Patient has been in SNF for short term rehab. CM at bedside with patient and patient is unable to give details about his care. Patient gave CM permission to call his daughter. CM attempted to reach daughter [name] with no answer. CM will attempt to call daughter later. TOC will continue to follow.   Expected Discharge Plan: Skilled Nursing Facility Barriers to Discharge: Continued Medical Work up   Patient Goals and CMS Choice Patient states their goals for this hospitalization and ongoing recovery are:: Patient is confused when asked this question he rambles CMS Medicare.gov Compare Post Acute Care list provided to::  (to return to previous facility)    Expected Discharge Plan and Services Expected Discharge Plan: Skilled Nursing Facility In-house Referral: NA Discharge Planning Services: CM Consult Post Acute Care Choice: Skilled Nursing Facility Living arrangements for the past 2 months: Skilled Nursing Facility                 DME Arranged: N/A DME Agency: NA       HH Arranged: NA HH Agency: NA        Prior Living Arrangements/Services Living arrangements for the past 2 months: Skilled Nursing Facility Lives with:: Facility Resident Patient language and need for interpreter reviewed:: Yes Do you feel safe going back to the place where you live?: Yes      Need for Family Participation in Patient Care: Yes (Comment) Care giver support system in place?: Yes (comment) Current home services:  (n/a) Criminal Activity/Legal Involvement  Pertinent to Current Situation/Hospitalization: No - Comment as needed  Activities of Daily Living Home Assistive Devices/Equipment: Environmental consultant (specify type) ADL Screening (condition at time of admission) Patient's cognitive ability adequate to safely complete daily activities?: No Is the patient deaf or have difficulty hearing?: No Does the patient have difficulty seeing, even when wearing glasses/contacts?: Yes Does the patient have difficulty concentrating, remembering, or making decisions?: Yes Patient able to express need for assistance with ADLs?: No (baseline is that he can follow commands - currently he cannot) Does the patient have difficulty dressing or bathing?: Yes Independently performs ADLs?: No Communication: Needs assistance Is this a change from baseline?: Pre-admission baseline Dressing (OT): Needs assistance Is this a change from baseline?: Pre-admission baseline Feeding: Needs assistance Is this a change from baseline?: Pre-admission baseline Bathing: Needs assistance Is this a change from baseline?: Pre-admission baseline Toileting: Needs assistance Is this a change from baseline?: Pre-admission baseline In/Out Bed: Needs assistance Is this a change from baseline?: Pre-admission baseline Walks in Home: Needs assistance Is this a change from baseline?: Pre-admission baseline Does the patient have difficulty walking or climbing stairs?: Yes Weakness of Legs: Both Weakness of Arms/Hands: Both  Permission Sought/Granted Permission sought to share information with : Family Supports Permission granted to share information with : Yes, Verbal Permission Granted        Permission granted to share info w Relationship: [name] daughter [phone]     Emotional Assessment Appearance:: Appears stated age Attitude/Demeanor/Rapport: Gracious Affect (typically observed):  Pleasant Orientation: : Oriented to Self (unable to determine cause patient is speaking in soft voice  and appears to have rambling conversation) Alcohol / Substance Use: Not Applicable Psych Involvement: No (comment)  Admission diagnosis:  Fall [W19.XXXA] AKI (acute kidney injury) (HCC) [N17.9] Patient Active Problem List   Diagnosis Date Noted   AKI (acute kidney injury) (HCC) 01/29/2021   Fall    Hydronephrosis of right kidney 01/02/2021   Complicated UTI (urinary tract infection) 01/02/2021   Hypernatremia 01/02/2021   Essential hypertension 01/02/2021   Protein-calorie malnutrition, severe 12/29/2020   Pressure injury of skin 12/24/2020   Acute metabolic encephalopathy 12/14/2020   Hypokalemia 12/14/2020   History of DVT (deep vein thrombosis) 12/14/2020   History of pulmonary embolus (PE) 12/14/2020   Severe sepsis (HCC) 12/04/2020   Lactic acidosis 12/04/2020   Acute cystitis without hematuria 12/04/2020   Foley catheter problem (HCC) 12/04/2020   Unilateral recurrent inguinal hernia without obstruction or gangrene 12/04/2020   Acute pyelonephritis 12/04/2020   BPH (benign prostatic hyperplasia) 12/04/2020   Chronic indwelling Foley catheter 12/04/2020   Hypertensive urgency 12/04/2020   CKD (chronic kidney disease), stage III (HCC) 12/04/2020   Acute pulmonary embolism (HCC) 02/24/2020   Ureteral obstruction, right 02/24/2020   Urinary tract infection associated with catheterization of urinary tract, initial encounter (HCC) 02/24/2020   Renal insufficiency 02/24/2020   Thoracic aortic aneurysm without rupture 02/24/2020   Bronchiectasis (HCC) 02/24/2020   Pulmonary embolism (HCC) 02/24/2020   Hydronephrosis with urinary obstruction due to ureteral calculus    PCP:  [name], MD Pharmacy:   [phone] [facility] [place], [place] - [address] [place] [address] Phone: [phone] Fax: [phone]  [facility] [address] [place] [address] Phone:  [phone] Fax: [phone]     Social Determinants of Health (SDOH) Interventions    Readmission Risk Interventions Readmission Risk Prevention Plan 02/01/2021 01/05/2021  Transportation Screening Complete Complete  Medication Review Oceanographer) Complete Complete  PCP or Specialist appointment within 3-5 days of discharge Complete Complete  HRI or Home Care Consult Complete Complete  SW Recovery Care/Counseling Consult Complete Complete  Palliative Care Screening Not Applicable -  Skilled Nursing Facility Complete Complete  Some recent data might be hidden

## 2021-02-01 NOTE — Care Management Important Message (Signed)
Important Message  Patient Details  Name: Corey Cummings MRN: [id number] Date of Birth: 11-Jul-1930   Medicare Important Message Given:  Yes     Jabre Heo 02/01/2021, 1:58 PM

## 2021-02-01 NOTE — Plan of Care (Signed)
  Problem: Education: Goal: Knowledge of General Education information will improve Description: Including pain rating scale, medication(s)/side effects and non-pharmacologic comfort measures Outcome: Progressing   Problem: Health Behavior/Discharge Planning: Goal: Ability to manage health-related needs will improve Outcome: Progressing   Problem: Clinical Measurements: Goal: Ability to maintain clinical measurements within normal limits will improve Outcome: Progressing Goal: Diagnostic test results will improve Outcome: Progressing   Problem: Activity: Goal: Risk for activity intolerance will decrease Outcome: Progressing   Problem: Nutrition: Goal: Adequate nutrition will be maintained Outcome: Progressing   Problem: Skin Integrity: Goal: Risk for impaired skin integrity will decrease Outcome: Progressing   

## 2021-02-01 NOTE — Progress Notes (Signed)
Modified Barium Swallow Progress Note  Patient Details  Name: Corey Cummings MRN: [id number] Date of Birth: 1930/04/20  Today's Date: 02/01/2021  Modified Barium Swallow completed.  Full report located under Chart Review in the Imaging Section.  Brief recommendations include the following:  Clinical Impression  Pt presents with oropharyngeal dysphagia characterized by impaired bolus cohesion, a pharyngeal delay, and reduced anterior laryngeal movement. He demonstrated premature spillage to the pyriform sinuses and intermittent incomplete laryngeal closure/reduced duration of closure. Penetration (PAS 5) was noted with consecutive swallows of thin liquids and nectar thick liquids, and inconsistently with individual boluses of thin liquids via straw. Silent aspiration (PAS 8) was subsequently noted after penetration of thin and nectar thick liquids. Prompted coughing did mobilize penetrated material and propel it slightly superiorly, but it was ineffective in expelling the material from the larynx.  The amount of penetrated and aspirated material was notably more significant with nectar thick liquids than with thin liquids, and nectar thick liquids were more resistant to mobilization with prompted coughing. A chin tuck posture was attempted, but pt demonstrated difficulty completing this accurately. Penetration and aspiration were eliminated with use of individual swallows via cup. It's is recommended that the pt's current diet of dysphagia 2 solids with thin liquids (via cup only) be continued with observance of swallowing precautions.   Swallow Evaluation Recommendations       SLP Diet Recommendations: Dysphagia 2 (Fine chop) solids;Thin liquid   Liquid Administration via: Cup;No straw   Medication Administration: Whole meds with puree   Supervision: Patient able to self feed;Full supervision/cueing for compensatory strategies   Compensations: Slow rate;Small sips/bites   Postural Changes:  Seated upright at 90 degrees   Oral Care Recommendations: Oral care BID      Corey Mcerlean I. Vear Clock, MS, CCC-SLP Acute Rehabilitation Services Office number [phone] Pager [phone]   [name] 02/01/2021,3:16 PM

## 2021-02-01 NOTE — Progress Notes (Addendum)
PROGRESS NOTE    Corey Cummings  JEH:[id number] DOB: Jul 17, 1930 DOA: 01/29/2021 PCP: [name], MD   Chief Complaint  Patient presents with   Fall   Level 2    Brief Narrative:   Corey Cummings is a 89/M with history of BPH, chronic indwelling Foley, history of obstructive nephrolithiasis status post stenting, history of pulmonary embolism in 11/21 on Eliquis, chronic iron deficiency anemia was sent to the ED from SNF with worsening mental status and a fall. -Upon work-up in the emergency room he was found to have gross hematuria, urinary retention, clogged Foley catheter and acute kidney injury along with encephalopathy  Assessment & Plan:  Gross hematuria -Multifactorial due to large prostate, anticoagulation and catheter trauma -Urology consulting, recommended to hold anticoagulation temporarily, irrigate every 6 hours and as needed for clots -Has chronic hydronephrosis on imaging, anticipate improvement now that the catheter was replaced and draining, ureteral stent was not recommended at this time -Continue with Flomax and proscar -Urine culture<10K colonies only -Since hematuria is clearing we will restart Eliquis today and monitor  AKI -Probably multifactorial, prerenal, postrenal, in the setting of obstructive uropathy from clogged Foley catheter, and poor oral intake/appetite from his illness and encephalopathy.  . -Creatinine is improving,, cut down IV fluids -Obstruction resolved  Acute blood loss anemia -Hemoglobin is 7.2 on admission, received 1 unit PRBC with good response -Continue to hold anticoagulation for 24 hours, today  History of PE -Eliquis was held on admission due to gross hematuria, will resume this today  Acute metabolic encephalopathy -Likely from AKI, mild uremia,  -Improved  Hypomagnesemia -Repleted   Hyperkalemia -Resolved with lokelma   Leukocytosis -Recent UTI, completed 7 days of p.o. antibiotics recently. -Probably related to the  AKI and acute hematuria/blood loss, will monitor off antibiotics for now.   HTN -Hold home BP medication, as needed hydralazine for now.   BPH, chronic ureteral obstruction on indwelling Foley -Foley was exchanged on admission..  DVT prophylaxis: SCDs, resume Eliquis today Code Status: Full Family Communication: No family at bedside today Disposition:   Status is: Inpatient  Remains inpatient appropriate:  Disposition: SNF when medically stable       Consultants:  Urology   Subjective:  -Feels okay, denies any complaints this morning Objective: Vitals:   01/30/21 2005 01/31/21 1138 01/31/21 2024 02/01/21 0306  BP: 133/62 (!) 150/78 (!) 145/68 (!) 162/68  Pulse:  69 65 69  Resp: 19 16 18 18   Temp: 98.6 F (37 C) 97.7 F (36.5 C) 97.6 F (36.4 C) 97.6 F (36.4 C)  TempSrc: Axillary Oral Axillary Axillary  SpO2:  98% 98% 98%  Weight:      Height:        Intake/Output Summary (Last 24 hours) at 02/01/2021 1121 Last data filed at 02/01/2021 0620 Gross per 24 hour  Intake --  Output 1250 ml  Net -1250 ml   Filed Weights   01/29/21 0901  Weight: 56.7 kg    Examination:  Gen: Chronically ill elderly male laying in bed, hard of hearing, awake, alert oriented to self and partly to place, cognitive deficits noted HEENT: No JVD CVS: S1-S2, regular rate rhythm Lungs: Decreased breath sounds to bases Abdomen: Soft, nontender, bowel sounds present GU: Foley catheter with pink-tinged to urine Extremities: No edema  Data Reviewed: I have personally reviewed following labs and imaging studies  CBC: Recent Labs  Lab 01/30/21 0421 01/30/21 1911 01/31/21 0141 02/01/21 0339 02/01/21 0606  WBC 6.9 6.4 5.8 5.0  6.2  NEUTROABS 5.1  --   --   --   --   HGB 7.2* 8.9* 8.3* 6.3* 8.6*  HCT 24.0* 28.1* 26.3* 21.2* 26.7*  MCV 87.0 85.4 85.9 91.8 85.3  PLT 241 280 270 189 246    Basic Metabolic Panel: Recent Labs  Lab 01/29/21 0900 01/29/21 1759 01/30/21 0421  01/31/21 0141 02/01/21 0606  NA 138 139 140 140 138  K 5.9* 4.6 4.1 3.7 3.9  CL 108 111 110 108 106  CO2 21* 21* 25 25 26   GLUCOSE 161* 113* 91 100* 145*  BUN 56* 51* 45* 33* 21  CREATININE 2.43* 1.90* 1.57* 1.25* 0.98  CALCIUM 9.1 8.3* 8.1* 8.0* 8.2*  MG  --   --   --  1.6*  --   PHOS  --   --   --  3.3  --     GFR: Estimated Creatinine Clearance: 41.8 mL/min (by C-G formula based on SCr of 0.98 mg/dL).  Liver Function Tests: No results for input(s): AST, ALT, ALKPHOS, BILITOT, PROT, ALBUMIN in the last 168 hours.  CBG: Recent Labs  Lab 01/30/21 2128  GLUCAP 82     Recent Results (from the past 240 hour(s))  Resp Panel by RT-PCR (Flu A&B, Covid) Nasopharyngeal Swab     Status: None   Collection Time: 01/29/21 11:01 AM   Specimen: Nasopharyngeal Swab; Nasopharyngeal(NP) swabs in vial transport medium  Result Value Ref Range Status   SARS Coronavirus 2 by RT PCR NEGATIVE NEGATIVE Final    Comment: (NOTE) SARS-CoV-2 target nucleic acids are NOT DETECTED.  The SARS-CoV-2 RNA is generally detectable in upper respiratory specimens during the acute phase of infection. The lowest concentration of SARS-CoV-2 viral copies this assay can detect is 138 copies/mL. A negative result does not preclude SARS-Cov-2 infection and should not be used as the sole basis for treatment or other patient management decisions. A negative result may occur with  improper specimen collection/handling, submission of specimen other than nasopharyngeal swab, presence of viral mutation(s) within the areas targeted by this assay, and inadequate number of viral copies(<138 copies/mL). A negative result must be combined with clinical observations, patient history, and epidemiological information. The expected result is Negative.  Fact Sheet for Patients:  01/31/21  Fact Sheet for Healthcare Providers:  BloggerCourse.com  This test is no t  yet approved or cleared by the [employer] FDA and  has been authorized for detection and/or diagnosis of SARS-CoV-2 by FDA under an Emergency Use Authorization (EUA). This EUA will remain  in effect (meaning this test can be used) for the duration of the COVID-19 declaration under Section 564(b)(1) of the Act, 21 U.S.C.section 360bbb-3(b)(1), unless the authorization is terminated  or revoked sooner.       Influenza A by PCR NEGATIVE NEGATIVE Final   Influenza B by PCR NEGATIVE NEGATIVE Final    Comment: (NOTE) The Xpert Xpress SARS-CoV-2/FLU/RSV plus assay is intended as an aid in the diagnosis of influenza from Nasopharyngeal swab specimens and should not be used as a sole basis for treatment. Nasal washings and aspirates are unacceptable for Xpert Xpress SARS-CoV-2/FLU/RSV testing.  Fact Sheet for Patients: [place]  Fact Sheet for Healthcare Providers: BloggerCourse.com  This test is not yet approved or cleared by the [employer] FDA and has been authorized for detection and/or diagnosis of SARS-CoV-2 by FDA under an Emergency Use Authorization (EUA). This EUA will remain in effect (meaning this test can be used) for the duration of  the COVID-19 declaration under Section 564(b)(1) of the Act, 21 U.S.C. section 360bbb-3(b)(1), unless the authorization is terminated or revoked.  Performed at [facility], [address] [place], [place] [address]   Urine Culture     Status: Abnormal   Collection Time: 01/29/21 11:45 AM   Specimen: Urine, Catheterized  Result Value Ref Range Status   Specimen Description URINE, CATHETERIZED  Final   Special Requests NONE  Final   Culture (A)  Final    <10,000 COLONIES/mL INSIGNIFICANT GROWTH Performed at [facility], [address], [place], [place] [address]    Report Status 01/30/2021 FINAL  Final     Scheduled Meds:  Chlorhexidine Gluconate Cloth  6 each  Topical Daily   feeding supplement  237 mL Oral TID BM   finasteride  5 mg Oral QHS   mouth rinse  15 mL Mouth Rinse BID   multivitamin with minerals  1 tablet Oral Daily   tamsulosin  0.4 mg Oral Daily   thiamine  100 mg Oral Daily   Continuous Infusions:     LOS: 3 days     Zannie Cove, MD [facility]   02/01/2021, 11:21 AM

## 2021-02-01 NOTE — Evaluation (Signed)
Occupational Therapy Evaluation Patient Details Name: Corey Cummings MRN: [id number] DOB: 1930-12-28 Today's Date: 02/01/2021   History of Present Illness Pt is an 87 y/o male admitted from SNF on 10/23 secondary to fall and hydronephrosis. PMH includes HTN, chronic indwelling Foley catheter, nephrolithiasis, PE on Xarelto, and BPH.   Clinical Impression   Pt admitted for above and limited by problem list below, including impaired balance, decreased activity tolerance and generalized weakness.  He is initially oriented, but during session requires re-orientation to situation and place (as reports he is in rehab).  He follows simple commands but presents with poor problem solving and awareness to deficits. Requires min assist for bed mobility, max assist for transfers using RW (sit to stand only), and up to total assist for LB ADLs.  He will benefit from further OT services while admitted and after dc at SNF level to optimize independence with ADLs and mobility.      Recommendations for follow up therapy are one component of a multi-disciplinary discharge planning process, led by the attending physician.  Recommendations may be updated based on patient status, additional functional criteria and insurance authorization.   Follow Up Recommendations  Skilled nursing-short term rehab (<3 hours/day)    Assistance Recommended at Discharge Frequent or constant Supervision/Assistance  Functional Status Assessment  Patient has had a recent decline in their functional status and demonstrates the ability to make significant improvements in function in a reasonable and predictable amount of time.  Equipment Recommendations  BSC    Recommendations for Other Services       Precautions / Restrictions Precautions Precautions: Fall Restrictions Weight Bearing Restrictions: No      Mobility Bed Mobility Overal bed mobility: Needs Assistance Bed Mobility: Sit to Supine;Rolling;Sidelying to  Sit Rolling: Min guard Sidelying to sit: Min guard;HOB elevated   Sit to supine: Min assist   General bed mobility comments: min guard to transiton to EOB, increased time and use of rail; min assist to return back to supine and scoot up in bed    Transfers Overall transfer level: Needs assistance Equipment used: Rolling walker (2 wheels) Transfers: Sit to/from [employer] Transfers Sit to Stand: Max assist           General transfer comment: max assist to power up and steady from EOB, cueing for hand placement and safety.      Balance Overall balance assessment: Needs assistance Sitting-balance support: Feet supported;No upper extremity supported Sitting balance-Leahy Scale: Fair Sitting balance - Comments: min guard dynamically   Standing balance support: Bilateral upper extremity supported;During functional activity Standing balance-Leahy Scale: Poor Standing balance comment: relies on BUE and external support                           ADL either performed or assessed with clinical judgement   ADL Overall ADL's : Needs assistance/impaired     Grooming: Set up;Bed level           Upper Body Dressing : Moderate assistance;Sitting   Lower Body Dressing: Total assistance;Sit to/from stand               Functional mobility during ADLs: Maximal assistance;Rolling walker (2 wheels) General ADL Comments: pt limited by weakness, decreased activity tolerance, impaired balance and cognitoin     Vision         Perception     Praxis      Pertinent Vitals/Pain Pain Assessment: No/denies pain  Hand Dominance     Extremity/Trunk Assessment Upper Extremity Assessment Upper Extremity Assessment: Generalized weakness   Lower Extremity Assessment Lower Extremity Assessment: Defer to PT evaluation   Cervical / Trunk Assessment Cervical / Trunk Assessment: Kyphotic   Communication Communication Communication: No difficulties    Cognition Arousal/Alertness: Awake/alert Behavior During Therapy: WFL for tasks assessed/performed Overall Cognitive Status: No family/caregiver present to determine baseline cognitive functioning Area of Impairment: Orientation;Following commands;Problem solving                 Orientation Level: Disoriented to;Situation;Place     Following Commands: Follows one step commands consistently;Follows one step commands with increased time     Problem Solving: Slow processing;Difficulty sequencing;Requires verbal cues General Comments: patient oriented and following commands, at times mentions he is in rehab and requires reorientation to hopsital and situation. Follows commands and engages well with thearpist, slow processing and decreased problem solving.     General Comments       Exercises     Shoulder Instructions      Home Living Family/patient expects to be discharged to:: Skilled nursing facility                                        Prior Functioning/Environment Prior Level of Function : Needs assist       Physical Assist : Mobility (physical) Mobility (physical): Bed mobility;Transfers;Gait ADLs (physical): Dressing;Bathing;Toileting;IADLs Mobility Comments: Per stepdaughter, pt was working with PT on ambulation. Reports he is supposed to be up in [facility] when not with PT, but has been in bed alot (per PT eval) ADLs Comments: Pt's stepdaughter reports staff is supposed to be helping with ADL tasks. (per PT eval- patient reports needing some assist for ADLs)        OT Problem List: Decreased strength;Decreased activity tolerance;Impaired balance (sitting and/or standing);Decreased cognition;Decreased safety awareness;Decreased knowledge of use of DME or AE;Decreased knowledge of precautions      OT Treatment/Interventions: Self-care/ADL training;Therapeutic exercise;DME and/or AE instruction;Therapeutic activities;Cognitive  remediation/compensation;Patient/family education;Balance training    OT Goals(Current goals can be found in the care plan section) Acute Rehab OT Goals Patient Stated Goal: to get better OT Goal Formulation: With patient Time For Goal Achievement: 02/16/21 Potential to Achieve Goals: Good  OT Frequency: Min 2X/week   Barriers to D/C:            Co-evaluation              AM-PAC OT "6 Clicks" Daily Activity     Outcome Measure Help from another person eating meals?: A Little Help from another person taking care of personal grooming?: A Little Help from another person toileting, which includes using toliet, bedpan, or urinal?: Total Help from another person bathing (including washing, rinsing, drying)?: A Lot Help from another person to put on and taking off regular upper body clothing?: A Lot Help from another person to put on and taking off regular lower body clothing?: Total 6 Click Score: 12   End of Session Equipment Utilized During Treatment: Gait belt;Rolling walker (2 wheels) Nurse Communication: Mobility status  Activity Tolerance: Patient tolerated treatment well Patient left: in bed;with call bell/phone within reach;with bed alarm set  OT Visit Diagnosis: Other abnormalities of gait and mobility (R26.89);Muscle weakness (generalized) (M62.81);Other symptoms and signs involving cognitive function  Time: 9357-0177 OT Time Calculation (min): 22 min Charges:  OT General Charges $OT Visit: 1 Visit OT Evaluation $OT Eval Moderate Complexity: 1 Mod  Corey Cummings, OT Acute Rehabilitation Services Pager [phone] Office [phone]   Corey Cummings 02/01/2021, 9:57 AM

## 2021-02-01 NOTE — NC FL2 (Signed)
[place] MEDICAID [id number] LEVEL OF CARE SCREENING TOOL     IDENTIFICATION  Patient Name: Corey Cummings Birthdate: [birth year] Sex: male Admission Date (Current Location): 01/29/2021  [facility] and [place] Number:  Producer, television/film/video and Address:  [facility] [place], [address] [place], [place] [address]      Provider Number: [id number]  Attending Physician Name and Address:  Zannie Cove, MD  Relative Name and Phone Number:  Minerva Areola (daughter) - [phone]    Current Level of Care: Hospital Recommended Level of Care: Skilled Nursing Facility Prior Approval Number:    Date Approved/Denied:   PASRR Number: [id number]  Discharge Plan: SNF    Current Diagnoses: Patient Active Problem List   Diagnosis Date Noted   AKI (acute kidney injury) (HCC) 01/29/2021   Fall    Hydronephrosis of right kidney 01/02/2021   Complicated UTI (urinary tract infection) 01/02/2021   Hypernatremia 01/02/2021   Essential hypertension 01/02/2021   Protein-calorie malnutrition, severe 12/29/2020   Pressure injury of skin 12/24/2020   Acute metabolic encephalopathy 12/14/2020   Hypokalemia 12/14/2020   History of DVT (deep vein thrombosis) 12/14/2020   History of pulmonary embolus (PE) 12/14/2020   Severe sepsis (HCC) 12/04/2020   Lactic acidosis 12/04/2020   Acute cystitis without hematuria 12/04/2020   Foley catheter problem (HCC) 12/04/2020   Unilateral recurrent inguinal hernia without obstruction or gangrene 12/04/2020   Acute pyelonephritis 12/04/2020   BPH (benign prostatic hyperplasia) 12/04/2020   Chronic indwelling Foley catheter 12/04/2020   Hypertensive urgency 12/04/2020   CKD (chronic kidney disease), stage III (HCC) 12/04/2020   Acute pulmonary embolism (HCC) 02/24/2020   Ureteral obstruction, right 02/24/2020   Urinary tract infection associated with catheterization of urinary tract, initial encounter (HCC) 02/24/2020   Renal insufficiency  02/24/2020   Thoracic aortic aneurysm without rupture 02/24/2020   Bronchiectasis (HCC) 02/24/2020   Pulmonary embolism (HCC) 02/24/2020   Hydronephrosis with urinary obstruction due to ureteral calculus     Orientation RESPIRATION BLADDER Height & Weight     Self  Normal Indwelling catheter Weight: 56.7 kg Height:  5\' 9"  (175.3 cm)  BEHAVIORAL SYMPTOMS/MOOD NEUROLOGICAL BOWEL NUTRITION STATUS     (n/a) Continent Diet (Dysphagia 2 with thin liquids)  AMBULATORY STATUS COMMUNICATION OF NEEDS Skin   Extensive Assist (rolling walker) Verbally PU Stage and Appropriate Care     PU Stage 3 Dressing:  (foam dressing)                 Personal Care Assistance Level of Assistance  Bathing, Feeding, Dressing Bathing Assistance: Limited assistance Feeding assistance: Limited assistance Dressing Assistance: Limited assistance     Functional Limitations Info  Sight, Hearing, Speech Sight Info: Impaired (eyeglasses) Hearing Info: Adequate Speech Info: Adequate    SPECIAL CARE FACTORS FREQUENCY  PT (By licensed PT), OT (By licensed OT)     PT Frequency: 5X/wk OT Frequency: 5X/wk            Contractures Contractures Info: Not present    Additional Factors Info  Code Status, Allergies, Psychotropic, Isolation Precautions, Insulin Sliding Scale, Suctioning Needs Code Status Info: DNR Allergies Info: No known drug allergies Psychotropic Info: see d/c summary Insulin Sliding Scale Info: see d/c summary Isolation Precautions Info: n/a Suctioning Needs: n/a   Current Medications (02/01/2021):  This is the current hospital active medication list Current Facility-Administered Medications  Medication Dose Route Frequency Provider Last Rate Last Admin   apixaban (ELIQUIS) tablet 5 mg  5  mg Oral BID Zannie Cove, MD   5 mg at 02/01/21 1410   Chlorhexidine Gluconate Cloth 2 % PADS 6 each  6 each Topical Daily Mikey College T, MD   6 each at 02/01/21 1020   feeding supplement  (ENSURE ENLIVE / ENSURE PLUS) liquid 237 mL  237 mL Oral TID BM Zannie Cove, MD       finasteride (PROSCAR) tablet 5 mg  5 mg Oral QHS Mikey College T, MD   5 mg at 01/31/21 2105   hydrALAZINE (APRESOLINE) injection 5 mg  5 mg Intravenous Q6H PRN Emeline General, MD       MEDLINE mouth rinse  15 mL Mouth Rinse BID Mikey College T, MD   15 mL at 02/01/21 0844   multivitamin with minerals tablet 1 tablet  1 tablet Oral Daily Zannie Cove, MD   1 tablet at 02/01/21 0836   ondansetron (ZOFRAN) tablet 4 mg  4 mg Oral Q6H PRN Mikey College T, MD       Or   ondansetron [facility]) injection 4 mg  4 mg Intravenous Q6H PRN Mikey College T, MD       tamsulosin [facility]) capsule 0.4 mg  0.4 mg Oral Daily Mikey College T, MD   0.4 mg at 02/01/21 6389   thiamine tablet 100 mg  100 mg Oral Daily Elgergawy, Leana Roe, MD   100 mg at 02/01/21 3734     Discharge Medications: Please see discharge summary for a list of discharge medications.  Relevant Imaging Results:  Relevant Lab Results:   Additional Information [phone]  [employer] x2, no Booster  Beckie Busing, RN

## 2021-02-01 NOTE — Progress Notes (Signed)
Speech Language Pathology Treatment: Dysphagia  Patient Details Name: Corey Cummings MRN: [id number] DOB: Feb 22, 1931 Today's Date: 02/01/2021 Time: 7829-5621 SLP Time Calculation (min) (ACUTE ONLY): 14 min  Assessment / Plan / Recommendation Clinical Impression  Pt was seen for dysphagia treatment. He was able to recall the need to avoid straws and stated that he only drank from the cup with lunch. Pt was educated regarding the results of the modified barium swallow study, diet recommendations, and swallowing precautions. Video recording of the study was used to facilitate education and pt verbalized understanding regarding all areas of education. With min cues, he was able to demonstrate understanding using teach back. Pt demonstrated use of individual boluses of thin liquids via cup and no s/sx of aspiration were noted. Pt reported that his wife will be bringing his dentures tonight and SLP will follow up for possible diet advancement.    HPI HPI: 87 y.o. male with medical history significant of BPH with chronic indwelling Foley catheter, obstructive renal stone status post stenting, HTN, PE November 21 on Eliquis, chronic iron deficiency anemia on iron supplement, was sent from nursing home for altered mentation and fall.  Patient was noted to be confused in ED, his work-up was significant for Meta urea, with urinary retention with clogged Foley catheter, AKI with a creatinine of 2.4, hematuria found to have Acute metabolic encephalopathy.      SLP Plan  Continue with current plan of care      Recommendations for follow up therapy are one component of a multi-disciplinary discharge planning process, led by the attending physician.  Recommendations may be updated based on patient status, additional functional criteria and insurance authorization.    Recommendations  Diet recommendations: Dysphagia 2 (fine chop);Thin liquid Liquids provided via: Cup;No straw Medication Administration: Whole  meds with puree Supervision: Patient able to self feed;Intermittent supervision to cue for compensatory strategies Compensations: Slow rate;Small sips/bites Postural Changes and/or Swallow Maneuvers: Seated upright 90 degrees                Oral Care Recommendations: Oral care BID Follow up Recommendations: Skilled Nursing facility SLP Visit Diagnosis: Dysphagia, oropharyngeal phase (R13.12) Plan: Continue with current plan of care       Jonovan Boedecker I. Vear Clock, MS, CCC-SLP Acute Rehabilitation Services Office number [phone] Pager [phone]                 [name]  02/01/2021, 3:53 PM

## 2021-02-01 NOTE — Progress Notes (Addendum)
Initial Nutrition Assessment  DOCUMENTATION CODES:  Severe malnutrition in context of chronic illness, Underweight  INTERVENTION:  -Ensure Enlive po TID, each supplement provides 350 kcal and 20 grams of protein -Magic cup BID with meals, each supplement provides 290 kcal and 9 grams of protein -MVI with minerals daily  Recommend checking Vitamin C and Zinc levels to determine if there is a need for additional supplementation to aid in wound healing. Juven may also be used for wound healing, but would like to ensure pt can tolerate meals and other supplements first given they are more nutritionally dense. Sent secure chat to MD to discuss.  NUTRITION DIAGNOSIS:  Severe Malnutrition related to chronic illness as evidenced by severe fat depletion, severe muscle depletion, percent weight loss.  GOAL:  Patient will meet greater than or equal to 90% of their needs  MONITOR:  PO intake, Supplement acceptance, Diet advancement, Labs, Weight trends, I & O's, Skin  REASON FOR ASSESSMENT:  Malnutrition Screening Tool, Consult Assessment of nutrition requirement/status  ASSESSMENT:  Pt with PMH significant of BPH with chronic indwelling Foley catheter, obstructive renal stone s/p stenting, HTN, PE November 21 on Eliquis, chronic iron deficiency anemia on iron supplement, was sent from SNF for AMS and fall. Patient was noted to be confused in ED, his work-up was significant for Meta urea, with urinary retention with clogged Foley catheter, AKI with a creatinine of 2.4, hematuria found to have Acute metabolic encephalopathy.  Discussed pt with RN and with SLP. Pt noted to be an unreliable historian at this time.  SLP evaluated pt yesterday and is recommending dysphagia 2 diet with thin liquids with plans to follow-up with MBSS to further assess swallowing safety and efficiency. RD to provide oral nutrition supplements and monitor for changes to diet order and make adjustments to nutrition plan of  care as necessary.  No PO intake documented as pt did not have diet ordered until after lunch yesterday. RD to monitor for adequacy of PO intake and results of upcoming MBSS. RD also to order supplements to aid in calorie/protein intake.   Medications: Ensure Enlive/Plus BID, thiamine Labs reviewed.   UOP: x24 hours I/O: - since admit  Per wt history, pt weighed 67 kg on 12/05/20 and 56.7 kg on 01/29/21. This indicates a clinically significant 15.4% weight loss x 2 months.   NUTRITION - FOCUSED PHYSICAL EXAM: Flowsheet Row Most Recent Value  Orbital Region Severe depletion  Upper Arm Region Severe depletion  Thoracic and Lumbar Region Severe depletion  Buccal Region Severe depletion  Temple Region Severe depletion  Clavicle Bone Region Severe depletion  Clavicle and Acromion Bone Region Severe depletion  Scapular Bone Region Severe depletion  Dorsal Hand Severe depletion  Patellar Region Severe depletion  Anterior Thigh Region Severe depletion  Posterior Calf Region Severe depletion  Edema (RD Assessment) None  Hair Reviewed  Eyes Reviewed  Mouth Other (Comment)  [edentulous]  Skin Reviewed  Nails Reviewed      Diet Order:   Diet Order             DIET DYS 2 Room service appropriate? Yes; Fluid consistency: Thin  Diet effective now                  EDUCATION NEEDS:  No education needs have been identified at this time  Skin:  Skin Assessment: Skin Integrity Issues: Skin Integrity Issues:: Stage III, Incisions Stage III: coccyx Incisions: penis (9/27)  Last BM:  PTA  Height:  Ht Readings from Last 1 Encounters:  01/29/21 5\' 9"  (1.753 m)   Weight:  Wt Readings from Last 10 Encounters:  01/29/21 56.7 kg  01/12/21 56.7 kg  01/02/21 57 kg  12/25/20 57 kg  12/10/20 66 kg  12/05/20 67 kg  02/27/20 68 kg   BMI:  Body mass index is 18.46 kg/m.  Estimated Nutritional Needs:  Kcal:  1850-2050 Protein:  90-105 grams Fluid:   >1.8L    02/29/20, MS, RD, LDN (she/her/hers) RD pager number and weekend/on-call pager number located in Amion.

## 2021-02-02 DIAGNOSIS — N179 Acute kidney failure, unspecified: Secondary | ICD-10-CM | POA: Diagnosis not present

## 2021-02-02 DIAGNOSIS — Z978 Presence of other specified devices: Secondary | ICD-10-CM | POA: Diagnosis not present

## 2021-02-02 DIAGNOSIS — G9341 Metabolic encephalopathy: Secondary | ICD-10-CM | POA: Diagnosis not present

## 2021-02-02 LAB — CBC
HCT: 27.2 % — ABNORMAL LOW (ref 39.0–52.0)
Hemoglobin: 8.6 g/dL — ABNORMAL LOW (ref 13.0–17.0)
MCH: 26.9 pg (ref 26.0–34.0)
MCHC: 31.6 g/dL (ref 30.0–36.0)
MCV: 85 fL (ref 80.0–100.0)
Platelets: 241 10*3/uL (ref 150–400)
RBC: 3.2 MIL/uL — ABNORMAL LOW (ref 4.22–5.81)
RDW: 17.2 % — ABNORMAL HIGH (ref 11.5–15.5)
WBC: 6 10*3/uL (ref 4.0–10.5)
nRBC: 0 % (ref 0.0–0.2)

## 2021-02-02 LAB — BASIC METABOLIC PANEL
Anion gap: 6 (ref 5–15)
BUN: 21 mg/dL (ref 8–23)
CO2: 25 mmol/L (ref 22–32)
Calcium: 8.1 mg/dL — ABNORMAL LOW (ref 8.9–10.3)
Chloride: 105 mmol/L (ref 98–111)
Creatinine, Ser: 1.2 mg/dL (ref 0.61–1.24)
GFR, Estimated: 58 mL/min — ABNORMAL LOW (ref >60)
Glucose, Bld: 145 mg/dL — ABNORMAL HIGH (ref 70–99)
Potassium: 3.8 mmol/L (ref 3.5–5.1)
Sodium: 136 mmol/L (ref 135–145)

## 2021-02-02 NOTE — Progress Notes (Signed)
Speech Language Pathology Treatment: Dysphagia  Patient Details Name: Corey Cummings MRN: [id number] DOB: October 24, 1930 Today's Date: 02/02/2021 Time: 1152-1206 SLP Time Calculation (min) (ACUTE ONLY): 14 min  Assessment / Plan / Recommendation Clinical Impression  Pt was seen for dysphagia treatment. He was alert and cooperative, but with increased confusion compared to when he was last seen yesterday, and he was unable to recall compensatory strategies. Pt's RN denied observance of any s/sx of aspiration with p.o. intake. Upon SLP's entry, pt was seated in the recliner with a slouched posture and both legs off the chair; he required encouragement and assistance to sit, and remain seated upright in the recliner. Pt's dentures were in place and he tolerated dysphagia 3 solids and thin liquids via cup when swallowing precautions were observed. He demonstrated coughing once when he consumed a larger bolus of thin liquids while leaning posteriorly. Mastication was [facility] and oral clearance adequate. Pt's diet will be advanced to dysphagia 3 solids and thin liquids with observance of swallowing precautions. SLP will continue to follow pt.     HPI HPI: 87 y.o. male with medical history significant of BPH with chronic indwelling Foley catheter, obstructive renal stone status post stenting, HTN, PE November 21 on Eliquis, chronic iron deficiency anemia on iron supplement, was sent from nursing home for altered mentation and fall.  Patient was noted to be confused in ED, his work-up was significant for Meta urea, with urinary retention with clogged Foley catheter, AKI with a creatinine of 2.4, hematuria found to have Acute metabolic encephalopathy.      SLP Plan  Continue with current plan of care      Recommendations for follow up therapy are one component of a multi-disciplinary discharge planning process, led by the attending physician.  Recommendations may be updated based on patient status, additional  functional criteria and insurance authorization.    Recommendations  Diet recommendations: Dysphagia 3 (mechanical soft);Thin liquid Liquids provided via: Cup;No straw Medication Administration: Whole meds with puree Supervision: Patient able to self feed;Intermittent supervision to cue for compensatory strategies Compensations: Slow rate;Small sips/bites Postural Changes and/or Swallow Maneuvers: Seated upright 90 degrees                Oral Care Recommendations: Oral care BID Follow up Recommendations: Skilled Nursing facility SLP Visit Diagnosis: Dysphagia, oropharyngeal phase (R13.12) Plan: Continue with current plan of care       Corey Cummings I. Vear Clock, MS, CCC-SLP Acute Rehabilitation Services Office number [phone] Pager [phone]                Corey Cummings  02/02/2021, 1:14 PM

## 2021-02-02 NOTE — Progress Notes (Signed)
Subjective: Still confused.   Objective: Vital signs in last 24 hours: Temp:  [97.5 F (36.4 C)-98.6 F (37 C)] 98.6 F (37 C) (10/27 2038) Pulse Rate:  [77-109] 88 (10/28 0608) Resp:  [18-20] 20 (10/28 6301) BP: (121-151)/(65-86) 140/65 (10/28 0608) SpO2:  [98 %-100 %] 100 % (10/28 6010)  Intake/Output from previous day: 10/27 0701 - 10/28 0700 In: 360 [P.O.:360] Out: 575 [Urine:575] Intake/Output this shift: No intake/output data recorded.  Physical Exam:  General: Alert and oriented CV: RRR Lungs: Clear Abdomen: Soft, ND, NT Ext: NT, No erythema  Lab Results: Recent Labs    02/01/21 0606 02/02/21 0153 02/03/21 0245  HGB 8.6* 8.6* 8.6*  HCT 26.7* 27.2* 27.7*   BMET Recent Labs    02/02/21 0153 02/03/21 0245  NA 136 137  K 3.8 4.1  CL 105 106  CO2 25 26  GLUCOSE 145* 109*  BUN 21 24*  CREATININE 1.20 1.38*  CALCIUM 8.1* 8.3*     Studies/Results: DG Swallowing Func-Speech Pathology  Result Date: 02/01/2021 Table formatting from the original result was not included. Objective Swallowing Evaluation: Type of Study: MBS-Modified Barium Swallow Study  Patient Details Name: Corey Cummings MRN: [id number] Date of Birth: January 22, 1931 Today's Date: 02/01/2021 Time: SLP Start Time (ACUTE ONLY): 1224 -SLP Stop Time (ACUTE ONLY): 1239 SLP Time Calculation (min) (ACUTE ONLY): 15 min Past Medical History: Past Medical History: Diagnosis Date  BPH (benign prostatic hyperplasia)   Hypertension  Past Surgical History: Past Surgical History: Procedure Laterality Date  APPENDECTOMY    CYSTOSCOPY W/ URETERAL STENT PLACEMENT Right 12/04/2020  Procedure: CYSTOSCOPY WITH RETROGRADE PYELOGRAM/URETERAL STENT PLACEMENT;  Surgeon: Jannifer Hick, MD;  Location: [facility];  Service: Urology;  Laterality: Right;  CYSTOSCOPY W/ URETERAL STENT PLACEMENT Right 01/03/2021  Procedure: CYSTOSCOPY LITHOTRIPSY WITH RETROGRADE PYELOGRAM/ STENT REMOVAL;  Surgeon: Noel Christmas, MD;  Location: [facility];   Service: Urology;  Laterality: Right; HPI: 87 y.o. male with medical history significant of BPH with chronic indwelling Foley catheter, obstructive renal stone status post stenting, HTN, PE November 21 on Eliquis, chronic iron deficiency anemia on iron supplement, was sent from nursing home for altered mentation and fall.  Patient was noted to be confused in ED, his work-up was significant for Meta urea, with urinary retention with clogged Foley catheter, AKI with a creatinine of 2.4, hematuria found to have Acute metabolic encephalopathy.  Subjective: alert, pleasant eager for PO Assessment / Plan / Recommendation [facility] IP CLINICAL IMPRESSIONS 02/01/2021 Clinical Impression Pt presents with oropharyngeal dysphagia characterized by impaired bolus cohesion, a pharyngeal delay, and reduced anterior laryngeal movement. He demonstrated premature spillage to the pyriform sinuses and intermittent incomplete laryngeal closure/reduced duration of closure. Penetration (PAS 5) was noted with consecutive swallows of thin liquids and nectar thick liquids, and inconsistently with individual boluses of thin liquids via straw. Silent aspiration (PAS 8) was subsequently noted after penetration of thin and nectar thick liquids. Prompted coughing did mobilize penetrated material and propel it slightly superiorly, but it was ineffective in expelling the material from the larynx.  The amount of penetrated and aspirated material was notably more significant with nectar thick liquids than with thin liquids, and nectar thick liquids were more resistant to mobilization with prompted coughing. A chin tuck posture was attempted, but pt demonstrated difficulty completing this accurately. Penetration and aspiration were eliminated with use of individual swallows via cup. It's is recommended that the pt's current diet of dysphagia 2 solids with thin liquids (via cup only) be  continued with observance of swallowing precautions. SLP Visit Diagnosis  Dysphagia, oropharyngeal phase (R13.12) Attention and concentration deficit following -- Frontal lobe and executive function deficit following -- Impact on safety and function Mild aspiration risk   [facility] IP TREATMENT RECOMMENDATION 02/01/2021 Treatment Recommendations Therapy as outlined in treatment plan below   Prognosis 02/01/2021 Prognosis for Safe Diet Advancement Good Barriers to Reach Goals Cognitive deficits Barriers/Prognosis Comment -- [facility] IP DIET RECOMMENDATION 02/01/2021 SLP Diet Recommendations Dysphagia 2 (Fine chop) solids;Thin liquid Liquid Administration via Cup;No straw Medication Administration Whole meds with puree Compensations Slow rate;Small sips/bites Postural Changes Seated upright at 90 degrees   CH[facility] IP OTHER RECOMMENDATIONS 02/01/2021 Recommended Consults -- Oral Care Recommendations Oral care BID Other Recommendations --   [facility] IP FOLLOW UP RECOMMENDATIONS 02/01/2021 Follow up Recommendations Skilled Nursing facility   [facility] IP FREQUENCY AND DURATION 02/01/2021 Speech Therapy Frequency (ACUTE ONLY) min 2x/week Treatment Duration 2 weeks      CH[facility] IP ORAL PHASE 02/01/2021 Oral Phase Impaired Oral - Pudding Teaspoon -- Oral - Pudding Cup -- Oral - Honey Teaspoon -- Oral - Honey Cup Decreased bolus cohesion;Premature spillage Oral - Nectar Teaspoon -- Oral - Nectar Cup Decreased bolus cohesion;Premature spillage Oral - Nectar Straw Decreased bolus cohesion;Premature spillage Oral - Thin Teaspoon -- Oral - Thin Cup Decreased bolus cohesion;Premature spillage Oral - Thin Straw Decreased bolus cohesion;Premature spillage Oral - Puree Decreased bolus cohesion;Premature spillage Oral - Mech Soft Impaired mastication Oral - Regular -- Oral - Multi-Consistency -- Oral - Pill -- Oral Phase - Comment --  CHL IP PHARYNGEAL PHASE 02/01/2021 Pharyngeal Phase Impaired Pharyngeal- Pudding Teaspoon -- Pharyngeal -- Pharyngeal- Pudding Cup -- Pharyngeal -- Pharyngeal- Honey Teaspoon -- Pharyngeal -- Pharyngeal-  Honey Cup Reduced anterior laryngeal mobility;Reduced epiglottic inversion;Delayed swallow initiation-pyriform sinuses Pharyngeal -- Pharyngeal- Nectar Teaspoon -- Pharyngeal -- Pharyngeal- Nectar Cup Reduced anterior laryngeal mobility;Reduced epiglottic inversion;Delayed swallow initiation-pyriform sinuses Pharyngeal -- Pharyngeal- Nectar Straw Reduced anterior laryngeal mobility;Reduced epiglottic inversion;Delayed swallow initiation-pyriform sinuses;Penetration/Aspiration during swallow;Penetration/Apiration after swallow Pharyngeal Material enters airway, CONTACTS cords and not ejected out;Material enters airway, passes BELOW cords without attempt by patient to eject out (silent aspiration) Pharyngeal- Thin Teaspoon -- Pharyngeal -- Pharyngeal- Thin Cup Reduced anterior laryngeal mobility;Reduced epiglottic inversion;Delayed swallow initiation-pyriform sinuses Pharyngeal Material does not enter airway Pharyngeal- Thin Straw Reduced anterior laryngeal mobility;Reduced epiglottic inversion;Delayed swallow initiation-pyriform sinuses;Penetration/Aspiration during swallow;Penetration/Apiration after swallow;Trace aspiration Pharyngeal Material enters airway, passes BELOW cords without attempt by patient to eject out (silent aspiration);Material enters airway, CONTACTS cords and not ejected out Pharyngeal- Puree Reduced anterior laryngeal mobility;Reduced epiglottic inversion;Delayed swallow initiation-pyriform sinuses Pharyngeal -- Pharyngeal- Mechanical Soft Reduced anterior laryngeal mobility;Reduced epiglottic inversion;Delayed swallow initiation-pyriform sinuses Pharyngeal -- Pharyngeal- Regular -- Pharyngeal -- Pharyngeal- Multi-consistency -- Pharyngeal -- Pharyngeal- Pill -- Pharyngeal -- Pharyngeal Comment --  CH[facility] IP CERVICAL ESOPHAGEAL PHASE 02/01/2021 Cervical Esophageal Phase WFL Pudding Teaspoon -- Pudding Cup -- Honey Teaspoon -- Honey Cup -- Nectar Teaspoon -- Nectar Cup -- Nectar Straw -- Thin  Teaspoon -- Thin Cup -- Thin Straw -- Puree -- Mechanical Soft -- Regular -- Multi-consistency -- Pill -- Cervical Esophageal Comment -- Shanika I. Vear Clock, MS, CCC-SLP Acute Rehabilitation Services Office number [phone] Pager [phone] [name] 02/01/2021, 3:20 PM               Assessment/Plan: Gross hematuria: Multifactorial due to large prostate, anticoagulation and catheter trauma. Chronic right hydronephrosis with minimal parenchyma. Right retrograde pyelogram 01/03/2021 with no obstruction identified. Very patulous right ureteral orifice.  -  Foley catheter clear yellow in tubing -Continue restraints for confusion so he does not pull catheter -Following peripherally   LOS: 5 days   Matt R. Enrigue Hashimi MD 02/03/2021, 7:30 AM [facility]  Pager: [phone]

## 2021-02-02 NOTE — Progress Notes (Signed)
Subjective: Pt agitated, confused. Denies pain.  Objective: Vital signs in last 24 hours: Temp:  [97.5 F (36.4 C)-97.8 F (36.6 C)] 97.5 F (36.4 C) (10/27 0837) Pulse Rate:  [70-109] 109 (10/27 0837) Resp:  [16-18] 18 (10/27 0837) BP: (121-153)/(70-89) 121/86 (10/27 0837) SpO2:  [98 %-99 %] 99 % (10/27 0837)  Intake/Output from previous day: 10/26 0701 - 10/27 0700 In: 480 [P.O.:480] Out: 250 [Urine:250] Intake/Output this shift: Total I/O In: -  Out: 175 [Urine:175]  Physical Exam:  General: Alert and oriented CV: RRR Lungs: Clear Abdomen: Soft, ND, NT Ext: NT, No erythema  Lab Results: Recent Labs    02/01/21 0339 02/01/21 0606 02/02/21 0153  HGB 6.3* 8.6* 8.6*  HCT 21.2* 26.7* 27.2*   BMET Recent Labs    02/01/21 0606 02/02/21 0153  NA 138 136  K 3.9 3.8  CL 106 105  CO2 26 25  GLUCOSE 145* 145*  BUN 21 21  CREATININE 0.98 1.20  CALCIUM 8.2* 8.1*     Studies/Results: DG Swallowing Func-Speech Pathology  Result Date: 02/01/2021 Table formatting from the original result was not included. Objective Swallowing Evaluation: Type of Study: MBS-Modified Barium Swallow Study  Patient Details Name: Corey Cummings MRN: [id number] Date of Birth: 1930-12-03 Today's Date: 02/01/2021 Time: SLP Start Time (ACUTE ONLY): 1224 -SLP Stop Time (ACUTE ONLY): 1239 SLP Time Calculation (min) (ACUTE ONLY): 15 min Past Medical History: Past Medical History: Diagnosis Date  BPH (benign prostatic hyperplasia)   Hypertension  Past Surgical History: Past Surgical History: Procedure Laterality Date  APPENDECTOMY    CYSTOSCOPY W/ URETERAL STENT PLACEMENT Right 12/04/2020  Procedure: CYSTOSCOPY WITH RETROGRADE PYELOGRAM/URETERAL STENT PLACEMENT;  Surgeon: Jannifer Hick, MD;  Location: [facility];  Service: Urology;  Laterality: Right;  CYSTOSCOPY W/ URETERAL STENT PLACEMENT Right 01/03/2021  Procedure: CYSTOSCOPY LITHOTRIPSY WITH RETROGRADE PYELOGRAM/ STENT REMOVAL;  Surgeon: Noel Christmas, MD;  Location: [facility];  Service: Urology;  Laterality: Right; HPI: 87 y.o. male with medical history significant of BPH with chronic indwelling Foley catheter, obstructive renal stone status post stenting, HTN, PE November 21 on Eliquis, chronic iron deficiency anemia on iron supplement, was sent from nursing home for altered mentation and fall.  Patient was noted to be confused in ED, his work-up was significant for Meta urea, with urinary retention with clogged Foley catheter, AKI with a creatinine of 2.4, hematuria found to have Acute metabolic encephalopathy.  Subjective: alert, pleasant eager for PO Assessment / Plan / Recommendation [facility]L IP CLINICAL IMPRESSIONS 02/01/2021 Clinical Impression Pt presents with oropharyngeal dysphagia characterized by impaired bolus cohesion, a pharyngeal delay, and reduced anterior laryngeal movement. He demonstrated premature spillage to the pyriform sinuses and intermittent incomplete laryngeal closure/reduced duration of closure. Penetration (PAS 5) was noted with consecutive swallows of thin liquids and nectar thick liquids, and inconsistently with individual boluses of thin liquids via straw. Silent aspiration (PAS 8) was subsequently noted after penetration of thin and nectar thick liquids. Prompted coughing did mobilize penetrated material and propel it slightly superiorly, but it was ineffective in expelling the material from the larynx.  The amount of penetrated and aspirated material was notably more significant with nectar thick liquids than with thin liquids, and nectar thick liquids were more resistant to mobilization with prompted coughing. A chin tuck posture was attempted, but pt demonstrated difficulty completing this accurately. Penetration and aspiration were eliminated with use of individual swallows via cup. It's is recommended that the pt's current diet of dysphagia 2 solids with  thin liquids (via cup only) be continued with observance of swallowing  precautions. SLP Visit Diagnosis Dysphagia, oropharyngeal phase (R13.12) Attention and concentration deficit following -- Frontal lobe and executive function deficit following -- Impact on safety and function Mild aspiration risk   [facility] IP TREATMENT RECOMMENDATION 02/01/2021 Treatment Recommendations Therapy as outlined in treatment plan below   Prognosis 02/01/2021 Prognosis for Safe Diet Advancement Good Barriers to Reach Goals Cognitive deficits Barriers/Prognosis Comment -- [facility] IP DIET RECOMMENDATION 02/01/2021 SLP Diet Recommendations Dysphagia 2 (Fine chop) solids;Thin liquid Liquid Administration via Cup;No straw Medication Administration Whole meds with puree Compensations Slow rate;Small sips/bites Postural Changes Seated upright at 90 degrees   CHL IP OTHER RECOMMENDATIONS 02/01/2021 Recommended Consults -- Oral Care Recommendations Oral care BID Other Recommendations --   [facility] IP FOLLOW UP RECOMMENDATIONS 02/01/2021 Follow up Recommendations Skilled Nursing facility   [facility] IP FREQUENCY AND DURATION 02/01/2021 Speech Therapy Frequency (ACUTE ONLY) min 2x/week Treatment Duration 2 weeks      [facility] IP ORAL PHASE 02/01/2021 Oral Phase Impaired Oral - Pudding Teaspoon -- Oral - Pudding Cup -- Oral - Honey Teaspoon -- Oral - Honey Cup Decreased bolus cohesion;Premature spillage Oral - Nectar Teaspoon -- Oral - Nectar Cup Decreased bolus cohesion;Premature spillage Oral - Nectar Straw Decreased bolus cohesion;Premature spillage Oral - Thin Teaspoon -- Oral - Thin Cup Decreased bolus cohesion;Premature spillage Oral - Thin Straw Decreased bolus cohesion;Premature spillage Oral - Puree Decreased bolus cohesion;Premature spillage Oral - Mech Soft Impaired mastication Oral - Regular -- Oral - Multi-Consistency -- Oral - Pill -- Oral Phase - Comment --  CHL IP PHARYNGEAL PHASE 02/01/2021 Pharyngeal Phase Impaired Pharyngeal- Pudding Teaspoon -- Pharyngeal -- Pharyngeal- Pudding Cup -- Pharyngeal -- Pharyngeal- Honey  Teaspoon -- Pharyngeal -- Pharyngeal- Honey Cup Reduced anterior laryngeal mobility;Reduced epiglottic inversion;Delayed swallow initiation-pyriform sinuses Pharyngeal -- Pharyngeal- Nectar Teaspoon -- Pharyngeal -- Pharyngeal- Nectar Cup Reduced anterior laryngeal mobility;Reduced epiglottic inversion;Delayed swallow initiation-pyriform sinuses Pharyngeal -- Pharyngeal- Nectar Straw Reduced anterior laryngeal mobility;Reduced epiglottic inversion;Delayed swallow initiation-pyriform sinuses;Penetration/Aspiration during swallow;Penetration/Apiration after swallow Pharyngeal Material enters airway, CONTACTS cords and not ejected out;Material enters airway, passes BELOW cords without attempt by patient to eject out (silent aspiration) Pharyngeal- Thin Teaspoon -- Pharyngeal -- Pharyngeal- Thin Cup Reduced anterior laryngeal mobility;Reduced epiglottic inversion;Delayed swallow initiation-pyriform sinuses Pharyngeal Material does not enter airway Pharyngeal- Thin Straw Reduced anterior laryngeal mobility;Reduced epiglottic inversion;Delayed swallow initiation-pyriform sinuses;Penetration/Aspiration during swallow;Penetration/Apiration after swallow;Trace aspiration Pharyngeal Material enters airway, passes BELOW cords without attempt by patient to eject out (silent aspiration);Material enters airway, CONTACTS cords and not ejected out Pharyngeal- Puree Reduced anterior laryngeal mobility;Reduced epiglottic inversion;Delayed swallow initiation-pyriform sinuses Pharyngeal -- Pharyngeal- Mechanical Soft Reduced anterior laryngeal mobility;Reduced epiglottic inversion;Delayed swallow initiation-pyriform sinuses Pharyngeal -- Pharyngeal- Regular -- Pharyngeal -- Pharyngeal- Multi-consistency -- Pharyngeal -- Pharyngeal- Pill -- Pharyngeal -- Pharyngeal Comment --  CHL IP CERVICAL ESOPHAGEAL PHASE 02/01/2021 Cervical Esophageal Phase WFL Pudding Teaspoon -- Pudding Cup -- Honey Teaspoon -- Honey Cup -- Nectar Teaspoon --  Nectar Cup -- Nectar Straw -- Thin Teaspoon -- Thin Cup -- Thin Straw -- Puree -- Mechanical Soft -- Regular -- Multi-consistency -- Pill -- Cervical Esophageal Comment -- Shanika I. Vear Clock, MS, CCC-SLP Acute Rehabilitation Services Office number [phone] Pager [phone] [name] 02/01/2021, 3:20 PM               Assessment/Plan: Gross hematuria: Multifactorial due to large prostate, anticoagulation and catheter trauma. Chronic right hydronephrosis with minimal parenchyma. Right retrograde pyelogram 01/03/2021 with no obstruction  identified. Very patulous right ureteral orifice.  -Foley catheter clear yellow in tubing -As he is intermittently agitated, will need to ensure that he does not pull foley. I suspect that this it the main reason for recurrent hematuria. Use mittens, restraints as needed. -Creatinine at baseline -Following peripherally.    LOS: 4 days   Matt R. Royann Wildasin MD 02/02/2021, 12:34 PM [facility]  Pager: [phone]

## 2021-02-02 NOTE — Plan of Care (Signed)

## 2021-02-02 NOTE — Progress Notes (Signed)
Physical Therapy Treatment Patient Details Name: Caitlin Hillmer MRN: [id number] DOB: 08-Apr-1931 Today's Date: 02/02/2021   History of Present Illness Pt is an 87 y/o male admitted from SNF on 10/23 secondary to fall and hydronephrosis. PMH includes HTN, chronic indwelling Foley catheter, nephrolithiasis, PE on Xarelto, and BPH.    PT Comments    Pt pleasantly confused with ability to increase gait distance and perform seated HEP. Pt eager to have some water end of session and able to state that he cannot use straws. Pt remains appropriate to return to SNF with therapy. Will continue to follow and recommend OOB for meals.     Recommendations for follow up therapy are one component of a multi-disciplinary discharge planning process, led by the attending physician.  Recommendations may be updated based on patient status, additional functional criteria and insurance authorization.  Follow Up Recommendations  Skilled nursing-short term rehab (<3 hours/day)     Assistance Recommended at Discharge Intermittent Supervision/Assistance  Equipment Recommendations  None recommended by PT    Recommendations for Other Services       Precautions / Restrictions Precautions Precautions: Fall Restrictions Weight Bearing Restrictions: No     Mobility  Bed Mobility Overal bed mobility: Needs Assistance Bed Mobility: Supine to Sit     Supine to sit: Min guard     General bed mobility comments: bed flat with increased time pt able to transition to sitting without physical assist    Transfers Overall transfer level: Needs assistance   Transfers: Sit to/from Stand Sit to Stand: Min assist           General transfer comment: min assist to rise from bed and control descent to chair with cues for hand placement    Ambulation/Gait Ambulation/Gait assistance: Min assist Gait Distance (Feet): 400 Feet Assistive device: Rolling walker (2 wheels) Gait Pattern/deviations: Step-through  pattern;Decreased stride length;Drifts right/left   Gait velocity interpretation: 1.31 - 2.62 ft/sec, indicative of limited community ambulator General Gait Details: pt with continual drift to right running into multiple objects despite cues with assist for direction and safety. pt with notable fatigue after 350' but able to complete gait with reliance on RW   Stairs             Wheelchair Mobility    Modified Rankin (Stroke Patients Only)       Balance Overall balance assessment: Needs assistance   Sitting balance-Leahy Scale: Fair Sitting balance - Comments: EOb without support   Standing balance support: Bilateral upper extremity supported;During functional activity Standing balance-Leahy Scale: Poor Standing balance comment: relies on BUE and external support                            Cognition Arousal/Alertness: Awake/alert Behavior During Therapy: WFL for tasks assessed/performed Overall Cognitive Status: No family/caregiver present to determine baseline cognitive functioning Area of Impairment: Orientation;Following commands;Problem solving                 Orientation Level: Disoriented to;Situation;Place     Following Commands: Follows one step commands consistently Safety/Judgement: Decreased awareness of deficits;Decreased awareness of safety   Problem Solving: Slow processing;Requires verbal cues General Comments: pt not oriented to time, place or situation. pt able to state name but lacks awareness of deficits and safety        Exercises General Exercises - Lower Extremity Long Arc Quad: AROM;Both;Seated;20 reps Hip Flexion/Marching: AROM;20 reps;Both;Seated    General Comments  Pertinent Vitals/Pain Pain Assessment: No/denies pain    Home Living                          Prior Function            PT Goals (current goals can now be found in the care plan section) Progress towards PT goals: Progressing  toward goals    Frequency    Min 2X/week      PT Plan Current plan remains appropriate    Co-evaluation              AM-PAC PT "6 Clicks" Mobility   Outcome Measure  Help needed turning from your back to your side while in a flat bed without using bedrails?: A Little Help needed moving from lying on your back to sitting on the side of a flat bed without using bedrails?: A Little Help needed moving to and from a bed to a chair (including a wheelchair)?: A Little Help needed standing up from a chair using your arms (e.g., wheelchair or bedside chair)?: A Little Help needed to walk in hospital room?: A Lot Help needed climbing 3-5 steps with a railing? : Total 6 Click Score: 15    End of Session Equipment Utilized During Treatment: Gait belt Activity Tolerance: Patient tolerated treatment well Patient left: in chair;with call bell/phone within reach;with chair alarm set Nurse Communication: Mobility status PT Visit Diagnosis: Unsteadiness on feet (R26.81);Muscle weakness (generalized) (M62.81);Difficulty in walking, not elsewhere classified (R26.2);History of falling (Z91.81)     Time: 2409-7353 PT Time Calculation (min) (ACUTE ONLY): 23 min  Charges:  $Gait Training: 8-22 mins $Therapeutic Exercise: 8-22 mins                     Lanitra Battaglini P, PT Acute Rehabilitation Services Pager: [phone] Office: [phone]    Anaysia Germer B Brooklyne Radke 02/02/2021, 9:52 AM

## 2021-02-02 NOTE — Progress Notes (Signed)
PROGRESS NOTE    Corey Cummings  JSE:[id number] DOB: 10-11-1930 DOA: 01/29/2021 PCP: [name], MD   Chief Complaint  Patient presents with   Fall   Level 2    Brief Narrative:   Corey Cummings is a 89/M with history of BPH, chronic indwelling Foley, history of obstructive nephrolithiasis status post stenting, history of pulmonary embolism in 11/21 on Eliquis, chronic iron deficiency anemia was sent to the ED from SNF with worsening mental status and a fall. -Upon work-up in the emergency room he was found to have gross hematuria, urinary retention, clogged Foley catheter and acute kidney injury along with encephalopathy  Assessment & Plan:  Gross hematuria -Multifactorial due to large prostate, anticoagulation and catheter trauma -Urology consulting, recommended to hold anticoagulation temporarily, irrigate every 6 hours and as needed for clots -Has chronic hydronephrosis on imaging, anticipate improvement now that the catheter was replaced and draining, ureteral stent was not recommended at this time -Continue with Flomax and proscar -Urine culture<10K colonies only, recently completed 7-day course of antibiotics for UTI PTA -Hematuria had cleared yesterday, restarted Eliquis, unfortunately this has recurred, restart bladder irrigation, hold Eliquis today -Check CBC in a.m.  AKI -Probably multifactorial, prerenal, postrenal, in the setting of obstructive uropathy from clogged Foley catheter, and poor oral intake/appetite from his illness and encephalopathy.  . -Creatinine is improving, fluids discontinued -Obstruction resolved  Acute blood loss anemia -Hemoglobin is 7.2 on admission, received 1 unit PRBC with good response -See discussion above, holding Eliquis again  History of PE -Eliquis on hold due to recurrent hematuria  Acute metabolic encephalopathy -Likely from AKI, mild uremia,  -Improved  Hypomagnesemia -Repleted   Hyperkalemia -Resolved with lokelma    Leukocytosis -Recent UTI, completed 7 days of p.o. antibiotics recently. -Probably related to the AKI and acute hematuria/blood loss, will monitor off antibiotics for now.   HTN -Hold home BP medication, as needed hydralazine for now.   BPH, chronic ureteral obstruction on indwelling Foley -Foley was exchanged on admission..  DVT prophylaxis: SCDs Code Status: Full Family Communication: No family at bedside today Disposition:   Status is: Inpatient  Remains inpatient appropriate:  Disposition: SNF when medically stable    Consultants:  Urology   Subjective:  -Feels okay, denies any complaints this morning Objective: Vitals:   02/01/21 1312 02/01/21 1450 02/01/21 2017 02/02/21 0837  BP: 132/70 (!) 153/89 130/77 121/86  Pulse: 70 80 80 (!) 109  Resp: 17  16 18   Temp: 97.7 F (36.5 C) (!) 97.5 F (36.4 C) 97.8 F (36.6 C) (!) 97.5 F (36.4 C)  TempSrc: Oral Oral Axillary Axillary  SpO2: 98%  99% 99%  Weight:      Height:        Intake/Output Summary (Last 24 hours) at 02/02/2021 1031 Last data filed at 02/02/2021 0840 Gross per 24 hour  Intake 360 ml  Output 425 ml  Net -65 ml   Filed Weights   01/29/21 0901  Weight: 56.7 kg    Examination:  Gen: Chronically ill elderly male laying in bed, hard of hearing, awake alert, oriented to self and place, cognitive deficits noted HEENT: No JVD CVS: S1-S2, regular rate rhythm Lungs: Decreased breath sounds to bases Abdomen: Soft, nontender, bowel sounds present GU: Foley catheter with hematuria, few clots  Extremities: No edema  Data Reviewed: I have personally reviewed following labs and imaging studies  CBC: Recent Labs  Lab 01/30/21 0421 01/30/21 1911 01/31/21 0141 02/01/21 02/03/21 02/01/21 0606 02/02/21 0153  WBC 6.9 6.4 5.8 5.0 6.2 6.0  NEUTROABS 5.1  --   --   --   --   --   HGB 7.2* 8.9* 8.3* 6.3* 8.6* 8.6*  HCT 24.0* 28.1* 26.3* 21.2* 26.7* 27.2*  MCV 87.0 85.4 85.9 91.8 85.3 85.0  PLT 241  280 270 189 246 241    Basic Metabolic Panel: Recent Labs  Lab 01/29/21 1759 01/30/21 0421 01/31/21 0141 02/01/21 0606 02/02/21 0153  NA 139 140 140 138 136  K 4.6 4.1 3.7 3.9 3.8  CL 111 110 108 106 105  CO2 21* 25 25 26 25   GLUCOSE 113* 91 100* 145* 145*  BUN 51* 45* 33* 21 21  CREATININE 1.90* 1.57* 1.25* 0.98 1.20  CALCIUM 8.3* 8.1* 8.0* 8.2* 8.1*  MG  --   --  1.6* 1.7  --   PHOS  --   --  3.3  --   --     GFR: Estimated Creatinine Clearance: 34.1 mL/min (by C-G formula based on SCr of 1.2 mg/dL).  Liver Function Tests: No results for input(s): AST, ALT, ALKPHOS, BILITOT, PROT, ALBUMIN in the last 168 hours.  CBG: Recent Labs  Lab 01/30/21 2128  GLUCAP 82     Recent Results (from the past 240 hour(s))  Resp Panel by RT-PCR (Flu A&B, Covid) Nasopharyngeal Swab     Status: None   Collection Time: 01/29/21 11:01 AM   Specimen: Nasopharyngeal Swab; Nasopharyngeal(NP) swabs in vial transport medium  Result Value Ref Range Status   SARS Coronavirus 2 by RT PCR NEGATIVE NEGATIVE Final    Comment: (NOTE) SARS-CoV-2 target nucleic acids are NOT DETECTED.  The SARS-CoV-2 RNA is generally detectable in upper respiratory specimens during the acute phase of infection. The lowest concentration of SARS-CoV-2 viral copies this assay can detect is 138 copies/mL. A negative result does not preclude SARS-Cov-2 infection and should not be used as the sole basis for treatment or other patient management decisions. A negative result may occur with  improper specimen collection/handling, submission of specimen other than nasopharyngeal swab, presence of viral mutation(s) within the areas targeted by this assay, and inadequate number of viral copies(<138 copies/mL). A negative result must be combined with clinical observations, patient history, and epidemiological information. The expected result is Negative.  Fact Sheet for Patients:   01/31/21  Fact Sheet for Healthcare Providers:  BloggerCourse.com  This test is no t yet approved or cleared by the [employer]iousBro[employer].[place] FDA and  has been authorized for detection and/or diagnosis of SARS-CoV-2 by FDA under an Emergency Use Authorization (EUA). This EUA will remain  in effect (meaning this test can be used) for the duration of the COVID-19 declaration under Section 564(b)(1) of the Act, 21 U.S.C.section 360bbb-3(b)(1), unless the authorization is terminated  or revoked sooner.       Influenza A by PCR NEGATIVE NEGATIVE Final   Influenza B by PCR NEGATIVE NEGATIVE Final    Comment: (NOTE) The Xpert Xpress SARS-CoV-2/FLU/RSV plus assay is intended as an aid in the diagnosis of influenza from Nasopharyngeal swab specimens and should not be used as a sole basis for treatment. Nasal washings and aspirates are unacceptable for Xpert Xpress SARS-CoV-2/FLU/RSV testing.  Fact Sheet for Patients: [place]  Fact Sheet for Healthcare Providers: BloggerCourse.com  This test is not yet approved or cleared by the SeriousBroker.[place] FDA and has been authorized for detection and/or diagnosis of SARS-CoV-2 by FDA under an Emergency Use Authorization (EUA). This EUA will remain in  effect (meaning this test can be used) for the duration of the COVID-19 declaration under Section 564(b)(1) of the Act, 21 U.S.C. section 360bbb-3(b)(1), unless the authorization is terminated or revoked.  Performed at [facility], [address], [place], [place] [address]   Urine Culture     Status: Abnormal   Collection Time: 01/29/21 11:45 AM   Specimen: Urine, Catheterized  Result Value Ref Range Status   Specimen Description URINE, CATHETERIZED  Final   Special Requests NONE  Final   Culture (A)  Final    <10,000 COLONIES/mL INSIGNIFICANT GROWTH Performed at [facility], [address], [place], [place] [address]    Report Status 01/30/2021 FINAL  Final     Scheduled Meds:  Chlorhexidine Gluconate Cloth  6 each Topical Daily   feeding supplement  237 mL Oral TID BM   finasteride  5 mg Oral QHS   mouth rinse  15 mL Mouth Rinse BID   multivitamin with minerals  1 tablet Oral Daily   tamsulosin  0.4 mg Oral Daily   thiamine  100 mg Oral Daily   Continuous Infusions:     LOS: 4 days     Zannie Cove, MD [facility]   02/02/2021, 10:31 AM

## 2021-02-03 DIAGNOSIS — Z978 Presence of other specified devices: Secondary | ICD-10-CM | POA: Diagnosis not present

## 2021-02-03 DIAGNOSIS — G9341 Metabolic encephalopathy: Secondary | ICD-10-CM | POA: Diagnosis not present

## 2021-02-03 DIAGNOSIS — N179 Acute kidney failure, unspecified: Secondary | ICD-10-CM | POA: Diagnosis not present

## 2021-02-03 LAB — CBC
HCT: 27.7 % — ABNORMAL LOW (ref 39.0–52.0)
Hemoglobin: 8.6 g/dL — ABNORMAL LOW (ref 13.0–17.0)
MCH: 26.8 pg (ref 26.0–34.0)
MCHC: 31 g/dL (ref 30.0–36.0)
MCV: 86.3 fL (ref 80.0–100.0)
Platelets: 223 10*3/uL (ref 150–400)
RBC: 3.21 MIL/uL — ABNORMAL LOW (ref 4.22–5.81)
RDW: 17.4 % — ABNORMAL HIGH (ref 11.5–15.5)
WBC: 6.7 10*3/uL (ref 4.0–10.5)
nRBC: 0 % (ref 0.0–0.2)

## 2021-02-03 LAB — BASIC METABOLIC PANEL
Anion gap: 5 (ref 5–15)
BUN: 24 mg/dL — ABNORMAL HIGH (ref 8–23)
CO2: 26 mmol/L (ref 22–32)
Calcium: 8.3 mg/dL — ABNORMAL LOW (ref 8.9–10.3)
Chloride: 106 mmol/L (ref 98–111)
Creatinine, Ser: 1.38 mg/dL — ABNORMAL HIGH (ref 0.61–1.24)
GFR, Estimated: 49 mL/min — ABNORMAL LOW (ref >60)
Glucose, Bld: 109 mg/dL — ABNORMAL HIGH (ref 70–99)
Potassium: 4.1 mmol/L (ref 3.5–5.1)
Sodium: 137 mmol/L (ref 135–145)

## 2021-02-03 LAB — ZINC: Zinc: 44 ug/dL (ref 44–115)

## 2021-02-03 NOTE — Progress Notes (Signed)
Occupational Therapy Treatment Patient Details Name: Corey Cummings MRN: [id number] DOB: 03-Jun-1930 Today's Date: 02/03/2021   History of present illness Pt is an 87 y/o male admitted from SNF on 10/23 secondary to fall and hydronephrosis. PMH includes HTN, chronic indwelling Foley catheter, nephrolithiasis, PE on Xarelto, and BPH.   OT comments  Short session this date with patient.  He presented with increased lethargy, closing eyes throughout, and with Bilateral hand mitts in place.   Attempted edge of bed sitting to increase alertness, and washing his face and hands.  Patient unable to maintain level of alertness, and OT deferred out of bed to a recliner.  Patient needing Max A with all mobility and light grooming task.  Continue efforts in the acute setting, but he is scheduled to return to his SNF when medically cleared.     Recommendations for follow up therapy are one component of a multi-disciplinary discharge planning process, led by the attending physician.  Recommendations may be updated based on patient status, additional functional criteria and insurance authorization.    Follow Up Recommendations  Skilled nursing-short term rehab (<3 hours/day)    Assistance Recommended at Discharge Frequent or constant Supervision/Assistance  Equipment Recommendations       Recommendations for Other Services      Precautions / Restrictions Precautions Precautions: Fall Precaution Comments: B hand mitts in place Restrictions Weight Bearing Restrictions: No       Mobility Bed Mobility Overal bed mobility: Needs Assistance Bed Mobility: Sidelying to Sit;Sit to Sidelying   Sidelying to sit: Max assist     Sit to sidelying: Max assist   Patient Response: Flat affect  Transfers                   General transfer comment: NT due to decreased LOA     Balance Overall balance assessment: Needs assistance Sitting-balance support: Feet supported;No upper extremity  supported Sitting balance-Leahy Scale: Poor   Postural control: Posterior lean;Left lateral lean                                 ADL either performed or assessed with clinical judgement   ADL       Grooming: Moderate assistance;Sitting           Upper Body Dressing : Moderate assistance;Sitting   Lower Body Dressing: Total assistance;Bed level                 General ADL Comments: limited this date by increased lethargy and decreased LOA                       Cognition Arousal/Alertness: Lethargic Behavior During Therapy: Flat affect Overall Cognitive Status: History of cognitive impairments - at baseline                                                              Pertinent Vitals/ Pain       Pain Assessment: Faces Faces Pain Scale: No hurt Pain Intervention(s): Monitored during session  Frequency  Min 2X/week        Progress Toward Goals  OT Goals(current goals can now be found in the care plan section)     Acute Rehab OT Goals OT Goal Formulation: Patient unable to participate in goal setting Time For Goal Achievement: 02/16/21 Potential to Achieve Goals: Fair  Plan Discharge plan remains appropriate    Co-evaluation                 AM-PAC OT "6 Clicks" Daily Activity     Outcome Measure   Help from another person eating meals?: A Lot Help from another person taking care of personal grooming?: A Lot Help from another person toileting, which includes using toliet, bedpan, or urinal?: Total Help from another person bathing (including washing, rinsing, drying)?: A Lot Help from another person to put on and taking off regular upper body clothing?: A Lot Help from another person to put on and taking off regular lower body clothing?: Total 6 Click Score: 10    End of Session    OT Visit Diagnosis: Other  abnormalities of gait and mobility (R26.89);Muscle weakness (generalized) (M62.81);Other symptoms and signs involving cognitive function   Activity Tolerance Patient limited by lethargy   Patient Left in bed;with call bell/phone within reach;with bed alarm set   Nurse Communication          Time: 0350-0938 OT Time Calculation (min): 12 min  Charges: OT General Charges $OT Visit: 1 Visit OT Treatments $Self Care/Home Management : 8-22 mins  02/03/2021  RP, OTR/L  Acute Rehabilitation Services  Office:  [phone]   Suzanna Obey 02/03/2021, 11:18 AM

## 2021-02-03 NOTE — Progress Notes (Signed)
PROGRESS NOTE    Corey Cummings  QIO:[id number] DOB: [birth year] DOA: 01/29/2021 PCP: [name], MD   Chief Complaint  Patient presents with   Fall   Level 2    Brief Narrative:   Corey Cummings is a 89/M with history of BPH, chronic indwelling Foley, history of obstructive nephrolithiasis status post stenting, history of pulmonary embolism in 11/21 on Eliquis, chronic iron deficiency anemia was sent to the ED from SNF with worsening mental status and a fall. -Upon work-up in the emergency room he was found to have gross hematuria, urinary retention, clogged Foley catheter and acute kidney injury along with encephalopathy  Assessment & Plan:  Gross hematuria -Multifactorial due to large prostate, anticoagulation and catheter trauma -Urology consulting, recommended to hold anticoagulation temporarily, irrigate every 6 hours and as needed for clots -Has chronic hydronephrosis on imaging, anticipate improvement now that the catheter was replaced and draining, ureteral stent was not recommended at this time -Continue with Flomax and proscar -Urine culture<10K colonies only, recently completed 7-day course of antibiotics for UTI PTA -Hematuria had cleared since admission, restarted Eliquis 10/26, unfortunately has recurrence of hematuria, reordered bladder irrigation and holding Eliquis again -Check CBC in a.m.  AKI -Probably multifactorial, prerenal, postrenal, in the setting of obstructive uropathy from clogged Foley catheter, and poor oral intake/appetite from his illness and encephalopathy.  . -Creatinine is improving, fluids discontinued -Obstruction resolved  Acute blood loss anemia -Hemoglobin is 7.2 on admission, received 1 unit PRBC with good response -See discussion above, holding Eliquis again  History of PE -Eliquis on hold due to recurrent hematuria  Acute metabolic encephalopathy,Delirium -Likely from AKI, mild uremia,  -Improved  Hypomagnesemia -Repleted    Hyperkalemia -Resolved with lokelma   Leukocytosis -Recent UTI, completed 7 days of p.o. antibiotics recently. -Probably related to the AKI and acute hematuria/blood loss, will monitor off antibiotics for now.   HTN -Hold home BP medication, as needed hydralazine for now.   BPH, chronic ureteral obstruction on indwelling Foley -Foley was exchanged on admission..  DVT prophylaxis: SCDs Code Status: Full Family Communication: No family at bedside, called and updated daughter Disposition:   Status is: Inpatient  Remains inpatient appropriate:  Disposition: SNF when medically stable    Consultants:  Urology   Subjective:  -Sleeping this morning, denies any specific complaints, some cognitive deficits noted, Objective: Vitals:   02/03/21 0608 02/03/21 0730 02/03/21 0900 02/03/21 1000  BP: 140/65 119/71 124/69 127/90  Pulse: 88 77 74 74  Resp: 20 16  16   Temp:    99 F (37.2 C)  TempSrc:    Axillary  SpO2: 100% 100% 100% 100%  Weight:      Height:        Intake/Output Summary (Last 24 hours) at 02/03/2021 1050 Last data filed at 02/02/2021 1844 Gross per 24 hour  Intake 360 ml  Output 400 ml  Net -40 ml   Filed Weights   01/29/21 0901  Weight: 56.7 kg    Examination:  Gen: Chronically ill elderly male laying in bed, hard of hearing, awake alert, oriented to self and place, cognitive deficits noted HEENT: No JVD CVS: S1-S2, regular rate rhythm Lungs: Decreased breath sounds to bases Abdomen: Soft, nontender, bowel sounds present GU: Foley catheter with gross hematuria, few clots  Extremities: No edema  Data Reviewed: I have personally reviewed following labs and imaging studies  CBC: Recent Labs  Lab 01/30/21 0421 01/30/21 1911 01/31/21 0141 02/01/21 02/03/21 02/01/21 0606 02/02/21 0153 02/03/21 0245  WBC 6.9   < > 5.8 5.0 6.2 6.0 6.7  NEUTROABS 5.1  --   --   --   --   --   --   HGB 7.2*   < > 8.3* 6.3* 8.6* 8.6* 8.6*  HCT 24.0*   < > 26.3*  21.2* 26.7* 27.2* 27.7*  MCV 87.0   < > 85.9 91.8 85.3 85.0 86.3  PLT 241   < > 270 189 246 241 223   < > = values in this interval not displayed.    Basic Metabolic Panel: Recent Labs  Lab 01/30/21 0421 01/31/21 0141 02/01/21 0606 02/02/21 0153 02/03/21 0245  NA 140 140 138 136 137  K 4.1 3.7 3.9 3.8 4.1  CL 110 108 106 105 106  CO2 25 25 26 25 26   GLUCOSE 91 100* 145* 145* 109*  BUN 45* 33* 21 21 24*  CREATININE 1.57* 1.25* 0.98 1.20 1.38*  CALCIUM 8.1* 8.0* 8.2* 8.1* 8.3*  MG  --  1.6* 1.7  --   --   PHOS  --  3.3  --   --   --     GFR: Estimated Creatinine Clearance: 29.7 mL/min (A) (by C-G formula based on SCr of 1.38 mg/dL (H)).  Liver Function Tests: No results for input(s): AST, ALT, ALKPHOS, BILITOT, PROT, ALBUMIN in the last 168 hours.  CBG: Recent Labs  Lab 01/30/21 2128  GLUCAP 82     Recent Results (from the past 240 hour(s))  Resp Panel by RT-PCR (Flu A&B, Covid) Nasopharyngeal Swab     Status: None   Collection Time: 01/29/21 11:01 AM   Specimen: Nasopharyngeal Swab; Nasopharyngeal(NP) swabs in vial transport medium  Result Value Ref Range Status   SARS Coronavirus 2 by RT PCR NEGATIVE NEGATIVE Final    Comment: (NOTE) SARS-CoV-2 target nucleic acids are NOT DETECTED.  The SARS-CoV-2 RNA is generally detectable in upper respiratory specimens during the acute phase of infection. The lowest concentration of SARS-CoV-2 viral copies this assay can detect is 138 copies/mL. A negative result does not preclude SARS-Cov-2 infection and should not be used as the sole basis for treatment or other patient management decisions. A negative result may occur with  improper specimen collection/handling, submission of specimen other than nasopharyngeal swab, presence of viral mutation(s) within the areas targeted by this assay, and inadequate number of viral copies(<138 copies/mL). A negative result must be combined with clinical observations, patient history,  and epidemiological information. The expected result is Negative.  Fact Sheet for Patients:  01/31/21  Fact Sheet for Healthcare Providers:  BloggerCourse.com  This test is no t yet approved or cleared by the [employer]ious[employer] FDA and  has been authorized for detection and/or diagnosis of SARS-CoV-2 by FDA under an Emergency Use Authorization (EUA). This EUA will remain  in effect (meaning this test can be used) for the duration of the COVID-19 declaration under Section 564(b)(1) of the Act, 21 U.S.C.section 360bbb-3(b)(1), unless the authorization is terminated  or revoked sooner.       Influenza A by PCR NEGATIVE NEGATIVE Final   Influenza B by PCR NEGATIVE NEGATIVE Final    Comment: (NOTE) The Xpert Xpress SARS-CoV-2/FLU/RSV plus assay is intended as an aid in the diagnosis of influenza from Nasopharyngeal swab specimens and should not be used as a sole basis for treatment. Nasal washings and aspirates are unacceptable for Xpert Xpress SARS-CoV-2/FLU/RSV testing.  Fact Sheet for Patients: [place]  Fact Sheet for Healthcare Providers: BloggerCourse.com  This test is not yet approved or cleared by the [place] and has been authorized for detection and/or diagnosis of SARS-CoV-2 by FDA under an Emergency Use Authorization (EUA). This EUA will remain in effect (meaning this test can be used) for the duration of the COVID-19 declaration under Section 564(b)(1) of the Act, 21 U.S.C. section 360bbb-3(b)(1), unless the authorization is terminated or revoked.  Performed at [facility], [address], [place], [place] [address]   Urine Culture     Status: Abnormal   Collection Time: 01/29/21 11:45 AM   Specimen: Urine, Catheterized  Result Value Ref Range Status   Specimen Description URINE, CATHETERIZED  Final   Special Requests NONE  Final    Culture (A)  Final    <10,000 COLONIES/mL INSIGNIFICANT GROWTH Performed at [facility], [address], [place], [place] [address]    Report Status 01/30/2021 FINAL  Final     Scheduled Meds:  Chlorhexidine Gluconate Cloth  6 each Topical Daily   feeding supplement  237 mL Oral TID BM   finasteride  5 mg Oral QHS   mouth rinse  15 mL Mouth Rinse BID   multivitamin with minerals  1 tablet Oral Daily   tamsulosin  0.4 mg Oral Daily   thiamine  100 mg Oral Daily   Continuous Infusions:     LOS: 5 days     Zannie Cove, MD [facility]   02/03/2021, 10:50 AM

## 2021-02-03 NOTE — Plan of Care (Signed)
  Problem: Activity: Goal: Risk for activity intolerance will decrease Outcome: Progressing   Problem: Nutrition: Goal: Adequate nutrition will be maintained Outcome: Progressing   Problem: Elimination: Goal: Will not experience complications related to bowel motility Outcome: Progressing   Problem: Pain Managment: Goal: General experience of comfort will improve Outcome: Progressing   

## 2021-02-03 NOTE — Progress Notes (Signed)
Speech Language Pathology Treatment: Dysphagia  Patient Details Name: Corey Cummings MRN: [id number] DOB: Oct 15, 1930 Today's Date: 02/03/2021 Time: 5643-3295 SLP Time Calculation (min) (ACUTE ONLY): 13 min  Assessment / Plan / Recommendation Clinical Impression  Pt was seen for dysphagia treatment with part of his lunch tray. Selena Batten, RN reported that the pt has been tolerating the current diet without overt s/sx of aspiration, but that he is more confused than yesterday. Pt was asleep upon SLP's entry, and roused easily with verbal and tactile stimulation, but exhibited difficulty maintaining alertness for extended periods. Pt required increased support for initial bolus manipulation. He was noted to request food, but he then purse his lips when boluses were presented and he required intermittent verbal prompts to open his mouth. Mastication was functional and only trace oral residue was noted. Pt's dysphagia is now more cognitively based. Pt's current diet of dysphagia 3 solids and thin liquids will be continued. However, considering pt's change in mentation compared to prior sessions, SLP will continue to follow briefly to ensure continued diet tolerance.    HPI HPI: 87 y.o. male with medical history significant of BPH with chronic indwelling Foley catheter, obstructive renal stone status post stenting, HTN, PE November 21 on Eliquis, chronic iron deficiency anemia on iron supplement, was sent from nursing home for altered mentation and fall.  Patient was noted to be confused in ED, his work-up was significant for Meta urea, with urinary retention with clogged Foley catheter, AKI with a creatinine of 2.4, hematuria found to have Acute metabolic encephalopathy.      SLP Plan  Continue with current plan of care      Recommendations for follow up therapy are one component of a multi-disciplinary discharge planning process, led by the attending physician.  Recommendations may be updated based on patient  status, additional functional criteria and insurance authorization.    Recommendations  Diet recommendations: Dysphagia 3 (mechanical soft);Thin liquid Liquids provided via: Cup;No straw Medication Administration: Whole meds with puree Supervision: Patient able to self feed;Intermittent supervision to cue for compensatory strategies Compensations: Slow rate;Small sips/bites Postural Changes and/or Swallow Maneuvers: Seated upright 90 degrees                Oral Care Recommendations: Oral care BID Follow up Recommendations: Skilled Nursing facility SLP Visit Diagnosis: Dysphagia, oropharyngeal phase (R13.12) Plan: Continue with current plan of care       Clairessa Boulet I. Vear Clock, MS, CCC-SLP Acute Rehabilitation Services Office number [phone] Pager [phone]                [name]  02/03/2021, 2:37 PM

## 2021-02-04 DIAGNOSIS — N179 Acute kidney failure, unspecified: Secondary | ICD-10-CM | POA: Diagnosis not present

## 2021-02-04 DIAGNOSIS — Z978 Presence of other specified devices: Secondary | ICD-10-CM | POA: Diagnosis not present

## 2021-02-04 DIAGNOSIS — G9341 Metabolic encephalopathy: Secondary | ICD-10-CM | POA: Diagnosis not present

## 2021-02-04 NOTE — Progress Notes (Signed)
PROGRESS NOTE    Corey Cummings  QIH:[id number] DOB: 05-11-1930 DOA: 01/29/2021 PCP: [name], MD   Chief Complaint  Patient presents with   Fall   Level 2    Brief Narrative:   Corey Cummings is a 89/M with history of BPH, chronic indwelling Foley, history of obstructive nephrolithiasis status post stenting, history of pulmonary embolism in 11/21 on Eliquis, chronic iron deficiency anemia was sent to the ED from SNF with worsening mental status and a fall. -Upon work-up in the emergency room he was found to have gross hematuria, urinary retention, clogged Foley catheter and acute kidney injury along with encephalopathy  Assessment & Plan:  Gross hematuria -Multifactorial due to large prostate, anticoagulation and catheter trauma -Urology consulting, recommended to hold anticoagulation temporarily, irrigate every 6 hours and as needed for clots -Has chronic hydronephrosis on imaging, anticipate improvement now that the catheter was replaced and draining, ureteral stent was not recommended at this time -Continue with Flomax and proscar -Urine culture<10K colonies only, recently completed 7-day course of antibiotics for UTI PTA -Hematuria had cleared since admission, restarted Eliquis 10/26, unfortunately has recurrence of hematuria, reordered bladder irrigation and held Eliquis again -Bleeding appears to have cleared again today, monitor through the day, restart Eliquis this evening or tomorrow -CBC in a.m.  AKI -Probably multifactorial, prerenal, postrenal, in the setting of obstructive uropathy from clogged Foley catheter, and poor oral intake/appetite from his illness and encephalopathy.  . -Creatinine is improving, fluids discontinued -Obstruction resolved  Acute blood loss anemia -Hemoglobin is 7.2 on admission, received 1 unit PRBC with good response -See discussion above, holding Eliquis again  History of PE -Eliquis on hold due to recurrent hematuria  Acute metabolic  encephalopathy,Delirium -Likely from AKI, mild uremia,  -Improved  Hypomagnesemia -Repleted   Hyperkalemia -Resolved with lokelma   Leukocytosis -Recent UTI, completed 7 days of p.o. antibiotics recently. -Probably related to the AKI and acute hematuria/blood loss, will monitor off antibiotics for now.   HTN -Hold home BP medication, as needed hydralazine for now.   BPH, chronic ureteral obstruction on indwelling Foley -Foley was exchanged on admission..  DVT prophylaxis: SCDs Code Status: Full Family Communication: No family at bedside, called and updated daughter 10/28 Disposition:   Status is: Inpatient  Remains inpatient appropriate:  Disposition: SNF when medically stable    Consultants:  Urology   Subjective:  -Feels okay, denies any specific complaints, no events reported overnight Objective: Vitals:   02/03/21 1300 02/03/21 1731 02/03/21 2057 02/04/21 0804  BP:  (!) 152/72 127/70 115/89  Pulse: 67 93 84 94  Resp: 12 18 18 16   Temp:  98.5 F (36.9 C) 98.6 F (37 C) 99.1 F (37.3 C)  TempSrc:  Oral Axillary Oral  SpO2: 99% 100% 97% 99%  Weight:      Height:        Intake/Output Summary (Last 24 hours) at 02/04/2021 1014 Last data filed at 02/04/2021 0505 Gross per 24 hour  Intake 20 ml  Output 1015 ml  Net -995 ml   Filed Weights   01/29/21 0901  Weight: 56.7 kg    Examination:  Gen: Chronically ill elderly male laying in bed, awake alert oriented to self and place, cognitive deficits noted, hard of hearing HEENT: no JVD Lungs: Good air movement bilaterally, CTAB CVS: S1S2/RRR Abd: soft, Non tender, non distended, BS present Extremities: No edema Skin: no new rashes on exposed skin  GU: Foley catheter with clear urine today  Data Reviewed: I  have personally reviewed following labs and imaging studies  CBC: Recent Labs  Lab 01/30/21 0421 01/30/21 1911 01/31/21 0141 02/01/21 0339 02/01/21 0606 02/02/21 0153 02/03/21 0245   WBC 6.9   < > 5.8 5.0 6.2 6.0 6.7  NEUTROABS 5.1  --   --   --   --   --   --   HGB 7.2*   < > 8.3* 6.3* 8.6* 8.6* 8.6*  HCT 24.0*   < > 26.3* 21.2* 26.7* 27.2* 27.7*  MCV 87.0   < > 85.9 91.8 85.3 85.0 86.3  PLT 241   < > 270 189 246 241 223   < > = values in this interval not displayed.    Basic Metabolic Panel: Recent Labs  Lab 01/30/21 0421 01/31/21 0141 02/01/21 0606 02/02/21 0153 02/03/21 0245  NA 140 140 138 136 137  K 4.1 3.7 3.9 3.8 4.1  CL 110 108 106 105 106  CO2 25 25 26 25 26   GLUCOSE 91 100* 145* 145* 109*  BUN 45* 33* 21 21 24*  CREATININE 1.57* 1.25* 0.98 1.20 1.38*  CALCIUM 8.1* 8.0* 8.2* 8.1* 8.3*  MG  --  1.6* 1.7  --   --   PHOS  --  3.3  --   --   --     GFR: Estimated Creatinine Clearance: 29.7 mL/min (A) (by C-G formula based on SCr of 1.38 mg/dL (H)).  Liver Function Tests: No results for input(s): AST, ALT, ALKPHOS, BILITOT, PROT, ALBUMIN in the last 168 hours.  CBG: Recent Labs  Lab 01/30/21 2128  GLUCAP 82     Recent Results (from the past 240 hour(s))  Resp Panel by RT-PCR (Flu A&B, Covid) Nasopharyngeal Swab     Status: None   Collection Time: 01/29/21 11:01 AM   Specimen: Nasopharyngeal Swab; Nasopharyngeal(NP) swabs in vial transport medium  Result Value Ref Range Status   SARS Coronavirus 2 by RT PCR NEGATIVE NEGATIVE Final    Comment: (NOTE) SARS-CoV-2 target nucleic acids are NOT DETECTED.  The SARS-CoV-2 RNA is generally detectable in upper respiratory specimens during the acute phase of infection. The lowest concentration of SARS-CoV-2 viral copies this assay can detect is 138 copies/mL. A negative result does not preclude SARS-Cov-2 infection and should not be used as the sole basis for treatment or other patient management decisions. A negative result may occur with  improper specimen collection/handling, submission of specimen other than nasopharyngeal swab, presence of viral mutation(s) within the areas targeted by  this assay, and inadequate number of viral copies(<138 copies/mL). A negative result must be combined with clinical observations, patient history, and epidemiological information. The expected result is Negative.  Fact Sheet for Patients:  01/31/21  Fact Sheet for Healthcare Providers:  BloggerCourse.com  This test is no t yet approved or cleared by the [employer] FDA and  has been authorized for detection and/or diagnosis of SARS-CoV-2 by FDA under an Emergency Use Authorization (EUA). This EUA will remain  in effect (meaning this test can be used) for the duration of the COVID-19 declaration under Section 564(b)(1) of the Act, 21 U.S.C.section 360bbb-3(b)(1), unless the authorization is terminated  or revoked sooner.       Influenza A by PCR NEGATIVE NEGATIVE Final   Influenza B by PCR NEGATIVE NEGATIVE Final    Comment: (NOTE) The Xpert Xpress SARS-CoV-2/FLU/RSV plus assay is intended as an aid in the diagnosis of influenza from Nasopharyngeal swab specimens and should not be used as a  sole basis for treatment. Nasal washings and aspirates are unacceptable for Xpert Xpress SARS-CoV-2/FLU/RSV testing.  Fact Sheet for Patients: BloggerCourse.com  Fact Sheet for Healthcare Providers: SeriousBroker.it  This test is not yet approved or cleared by the [place] FDA and has been authorized for detection and/or diagnosis of SARS-CoV-2 by FDA under an Emergency Use Authorization (EUA). This EUA will remain in effect (meaning this test can be used) for the duration of the COVID-19 declaration under Section 564(b)(1) of the Act, 21 U.S.C. section 360bbb-3(b)(1), unless the authorization is terminated or revoked.  Performed at [facility], [address], [place], [place] [address]   Urine Culture     Status: Abnormal   Collection Time: 01/29/21 11:45 AM    Specimen: Urine, Catheterized  Result Value Ref Range Status   Specimen Description URINE, CATHETERIZED  Final   Special Requests NONE  Final   Culture (A)  Final    <10,000 COLONIES/mL INSIGNIFICANT GROWTH Performed at [facility], [address], [place], [place] [address]    Report Status 01/30/2021 FINAL  Final     Scheduled Meds:  Chlorhexidine Gluconate Cloth  6 each Topical Daily   feeding supplement  237 mL Oral TID BM   finasteride  5 mg Oral QHS   mouth rinse  15 mL Mouth Rinse BID   multivitamin with minerals  1 tablet Oral Daily   tamsulosin  0.4 mg Oral Daily   thiamine  100 mg Oral Daily   Continuous Infusions:     LOS: 6 days     Zannie Cove, MD [facility]   02/04/2021, 10:14 AM

## 2021-02-04 NOTE — Plan of Care (Signed)
?  Problem: Elimination: ?Goal: Will not experience complications related to bowel motility ?Outcome: Progressing ?  ?Problem: Pain Managment: ?Goal: General experience of comfort will improve ?Outcome: Progressing ?  ?Problem: Safety: ?Goal: Ability to remain free from injury will improve ?Outcome: Progressing ?  ?

## 2021-02-05 DIAGNOSIS — G9341 Metabolic encephalopathy: Secondary | ICD-10-CM | POA: Diagnosis not present

## 2021-02-05 DIAGNOSIS — Z978 Presence of other specified devices: Secondary | ICD-10-CM | POA: Diagnosis not present

## 2021-02-05 DIAGNOSIS — N179 Acute kidney failure, unspecified: Secondary | ICD-10-CM | POA: Diagnosis not present

## 2021-02-05 LAB — BASIC METABOLIC PANEL
Anion gap: 6 (ref 5–15)
BUN: 32 mg/dL — ABNORMAL HIGH (ref 8–23)
CO2: 24 mmol/L (ref 22–32)
Calcium: 8.4 mg/dL — ABNORMAL LOW (ref 8.9–10.3)
Chloride: 106 mmol/L (ref 98–111)
Creatinine, Ser: 1.17 mg/dL (ref 0.61–1.24)
GFR, Estimated: 60 mL/min — ABNORMAL LOW (ref >60)
Glucose, Bld: 147 mg/dL — ABNORMAL HIGH (ref 70–99)
Potassium: 4 mmol/L (ref 3.5–5.1)
Sodium: 136 mmol/L (ref 135–145)

## 2021-02-05 LAB — CBC
HCT: 25.6 % — ABNORMAL LOW (ref 39.0–52.0)
Hemoglobin: 8 g/dL — ABNORMAL LOW (ref 13.0–17.0)
MCH: 27 pg (ref 26.0–34.0)
MCHC: 31.3 g/dL (ref 30.0–36.0)
MCV: 86.5 fL (ref 80.0–100.0)
Platelets: 249 10*3/uL (ref 150–400)
RBC: 2.96 MIL/uL — ABNORMAL LOW (ref 4.22–5.81)
RDW: 17 % — ABNORMAL HIGH (ref 11.5–15.5)
WBC: 5.5 10*3/uL (ref 4.0–10.5)
nRBC: 0 % (ref 0.0–0.2)

## 2021-02-05 LAB — VITAMIN C: Vitamin C: 1.1 mg/dL (ref 0.4–2.0)

## 2021-02-05 MED ORDER — APIXABAN 5 MG PO TABS
5.0000 mg | ORAL_TABLET | Freq: Two times a day (BID) | ORAL | Status: DC
  Administered 2021-02-05 (×2): 5 mg via ORAL
  Filled 2021-02-05 (×2): qty 1

## 2021-02-05 NOTE — Progress Notes (Signed)
PROGRESS NOTE    Corey Cummings  [id number] DOB: 1930-07-14 DOA: 01/29/2021 PCP: Jonathon Jordan, MD   Chief Complaint  Patient presents with   Fall   Level 2    Brief Narrative:   Corey Cummings is a 89/M with history of BPH, chronic indwelling Foley, history of obstructive nephrolithiasis status post stenting, history of pulmonary embolism in 11/21 on Eliquis, chronic iron deficiency anemia was sent to the ED from SNF with worsening mental status and a fall. -Upon work-up in the emergency room he was found to have gross hematuria, urinary retention, clogged Foley catheter and acute kidney injury along with encephalopathy  Assessment & Plan:  Gross hematuria -Multifactorial due to large prostate, anticoagulation and catheter trauma -Urology consulting, recommended to hold anticoagulation temporarily, irrigate every 6 hours and as needed for clots -Has chronic hydronephrosis on imaging, anticipate improvement now that the catheter was replaced and draining, ureteral stent was not recommended at this time -Continue with Flomax and proscar -Urine culture<10K colonies only, recently completed 7-day course of antibiotics for UTI PTA -Hematuria had cleared earlier, was restarted on Eliquis 10/26 unfortunately had recurrence of hematuria, Eliquis subsequently held again, bladder irrigation was resumed  -Bleeding has cleared again will retrial Eliquis, monitor for hematuria -CBC in a.m.  AKI -Probably multifactorial, prerenal, postrenal, in the setting of obstructive uropathy from clogged Foley catheter, and poor oral intake/appetite from his illness and encephalopathy.  . -Creatinine is improving, fluids discontinued -Obstruction resolved  Acute blood loss anemia -Hemoglobin is 7.2 on admission, received 1 unit PRBC with good response  History of PE -See discussion above, restarting Eliquis  Acute metabolic encephalopathy,Delirium -Likely from AKI, mild uremia,   -Improved  Hypomagnesemia -Repleted   Hyperkalemia -Resolved with lokelma   Leukocytosis -Recent UTI, completed 7 days of p.o. antibiotics recently. -Probably related to the AKI and acute hematuria/blood loss, will monitor off antibiotics for now.   HTN -Hold home BP medication, as needed hydralazine for now.   BPH, chronic ureteral obstruction on indwelling Foley -Foley was exchanged on admission..  DVT prophylaxis: SCDs Code Status: Full Family Communication: No family at bedside, called and updated daughter 10/28 Disposition:   Status is: Inpatient  Remains inpatient appropriate:  Disposition: SNF when medically stable    Consultants:  Urology   Subjective: -Feels okay, denies any events overnight, urine remains clear Objective: Vitals:   02/03/21 2057 02/04/21 0804 02/04/21 1956 02/05/21 0421  BP: 127/70 115/89 133/73 125/81  Pulse: 84 94 91 100  Resp: 18 16 18 20   Temp: 98.6 F (37 C) 99.1 F (37.3 C) 98 F (36.7 C) 97.9 F (36.6 C)  TempSrc: Axillary Oral Oral   SpO2: 97% 99% 99% 98%  Weight:      Height:        Intake/Output Summary (Last 24 hours) at 02/05/2021 1020 Last data filed at 02/05/2021 [id number] Gross per 24 hour  Intake 360 ml  Output 600 ml  Net -240 ml   Filed Weights   01/29/21 0901  Weight: 56.7 kg    Examination:  Gen: Chronically ill elderly male laying in bed, awake alert oriented to self and place, cognitive deficits noted, hard of hearing HEENT: No JVD CVS: S1-S2, regular rate rhythm Lungs: Clear bilaterally Abdomen: Soft, nontender, bowel sounds present Extremities: No edema Skin: no new rashes on exposed skin  GU: Foley catheter with clear urine today  Data Reviewed: I have personally reviewed following labs and imaging studies  CBC: Recent Labs  Lab  01/30/21 0421 01/30/21 1911 02/01/21 0339 02/01/21 0606 02/02/21 0153 02/03/21 0245 02/05/21 0135  WBC 6.9   < > 5.0 6.2 6.0 6.7 5.5  NEUTROABS 5.1  --    --   --   --   --   --   HGB 7.2*   < > 6.3* 8.6* 8.6* 8.6* 8.0*  HCT 24.0*   < > 21.2* 26.7* 27.2* 27.7* 25.6*  MCV 87.0   < > 91.8 85.3 85.0 86.3 86.5  PLT 241   < > 189 246 241 223 249   < > = values in this interval not displayed.    Basic Metabolic Panel: Recent Labs  Lab 01/31/21 0141 02/01/21 0606 02/02/21 0153 02/03/21 0245 02/05/21 0135  NA 140 138 136 137 136  K 3.7 3.9 3.8 4.1 4.0  CL 108 106 105 106 106  CO2 25 26 25 26 24   GLUCOSE 100* 145* 145* 109* 147*  BUN 33* 21 21 24* 32*  CREATININE 1.25* 0.98 1.20 1.38* 1.17  CALCIUM 8.0* 8.2* 8.1* 8.3* 8.4*  MG 1.6* 1.7  --   --   --   PHOS 3.3  --   --   --   --     GFR: Estimated Creatinine Clearance: 35 mL/min (by C-G formula based on SCr of 1.17 mg/dL).  Liver Function Tests: No results for input(s): AST, ALT, ALKPHOS, BILITOT, PROT, ALBUMIN in the last 168 hours.  CBG: Recent Labs  Lab 01/30/21 2128  GLUCAP 82     Recent Results (from the past 240 hour(s))  Resp Panel by RT-PCR (Flu A&B, Covid) Nasopharyngeal Swab     Status: None   Collection Time: 01/29/21 11:01 AM   Specimen: Nasopharyngeal Swab; Nasopharyngeal(NP) swabs in vial transport medium  Result Value Ref Range Status   SARS Coronavirus 2 by RT PCR NEGATIVE NEGATIVE Final    Comment: (NOTE) SARS-CoV-2 target nucleic acids are NOT DETECTED.  The SARS-CoV-2 RNA is generally detectable in upper respiratory specimens during the acute phase of infection. The lowest concentration of SARS-CoV-2 viral copies this assay can detect is 138 copies/mL. A negative result does not preclude SARS-Cov-2 infection and should not be used as the sole basis for treatment or other patient management decisions. A negative result may occur with  improper specimen collection/handling, submission of specimen other than nasopharyngeal swab, presence of viral mutation(s) within the areas targeted by this assay, and inadequate number of viral copies(<138 copies/mL).  A negative result must be combined with clinical observations, patient history, and epidemiological information. The expected result is Negative.  Fact Sheet for Patients:  EntrepreneurPulse.com.au  Fact Sheet for Healthcare Providers:  IncredibleEmployment.be  This test is no t yet approved or cleared by the [place] FDA and  has been authorized for detection and/or diagnosis of SARS-CoV-2 by FDA under an Emergency Use Authorization (EUA). This EUA will remain  in effect (meaning this test can be used) for the duration of the COVID-19 declaration under Section 564(b)(1) of the Act, 21 U.S.C.section 360bbb-3(b)(1), unless the authorization is terminated  or revoked sooner.       Influenza A by PCR NEGATIVE NEGATIVE Final   Influenza B by PCR NEGATIVE NEGATIVE Final    Comment: (NOTE) The Xpert Xpress SARS-CoV-2/FLU/RSV plus assay is intended as an aid in the diagnosis of influenza from Nasopharyngeal swab specimens and should not be used as a sole basis for treatment. Nasal washings and aspirates are unacceptable for Xpert Xpress SARS-CoV-2/FLU/RSV testing.  Fact Sheet for Patients: BloggerCourse.com  Fact Sheet for Healthcare Providers: SeriousBroker.it  This test is not yet approved or cleared by the [place] FDA and has been authorized for detection and/or diagnosis of SARS-CoV-2 by FDA under an Emergency Use Authorization (EUA). This EUA will remain in effect (meaning this test can be used) for the duration of the COVID-19 declaration under Section 564(b)(1) of the Act, 21 U.S.C. section 360bbb-3(b)(1), unless the authorization is terminated or revoked.  Performed at [facility], [address], [place], [place] [address]   Urine Culture     Status: Abnormal   Collection Time: 01/29/21 11:45 AM   Specimen: Urine, Catheterized  Result Value Ref Range Status    Specimen Description URINE, CATHETERIZED  Final   Special Requests NONE  Final   Culture (A)  Final    <10,000 COLONIES/mL INSIGNIFICANT GROWTH Performed at [facility], [address], [place], [place] [address]    Report Status 01/30/2021 FINAL  Final     Scheduled Meds:  apixaban  5 mg Oral BID   Chlorhexidine Gluconate Cloth  6 each Topical Daily   feeding supplement  237 mL Oral TID BM   finasteride  5 mg Oral QHS   mouth rinse  15 mL Mouth Rinse BID   multivitamin with minerals  1 tablet Oral Daily   tamsulosin  0.4 mg Oral Daily   thiamine  100 mg Oral Daily   Continuous Infusions:   LOS: 7 days     Zannie Cove, MD [facility]   02/05/2021, 10:20 AM

## 2021-02-05 NOTE — Progress Notes (Signed)
Speech Language Pathology Treatment: Dysphagia  Patient Details Name: Corey Cummings MRN: [id number] DOB: 1930-05-02 Today's Date: 02/05/2021 Time: 3428-7681 SLP Time Calculation (min) (ACUTE ONLY): 13 min  Assessment / Plan / Recommendation Clinical Impression  Pt was seen for dysphagia treatment and he was cooperative throughout the session. His mentation was improved and appeared similar to that noted on the date of the MBS. Pt denied any difficulty with p.o. intake. Corey Garnet, RN denied observance of any s/sx of aspiration, but stated that the pt required reminders regarding his need to avoid straws. Pt tolerated dysphagia 3 solids, and thin liquids via cup without overt s/sx of aspiration when swallowing precautions were observed. However, he demonstrated a single cough once when he consumed two consecutive swallows. Oral phase was [facility] with adequate mastication and oral clearance. It is recommended that the current diet of dysphagia 3 solids and thin liquids be continued with observance of swallowing precautions. Further skilled SLP services are not clinically indicated at this time.    HPI HPI: 87 y.o. male with medical history significant of BPH with chronic indwelling Foley catheter, obstructive renal stone status post stenting, HTN, PE November 21 on Eliquis, chronic iron deficiency anemia on iron supplement, was sent from nursing home for altered mentation and fall.  Patient was noted to be confused in ED, his work-up was significant for Meta urea, with urinary retention with clogged Foley catheter, AKI with a creatinine of 2.4, hematuria found to have Acute metabolic encephalopathy.      SLP Plan  All goals met;Discharge SLP treatment due to (comment)      Recommendations for follow up therapy are one component of a multi-disciplinary discharge planning process, led by the attending physician.  Recommendations may be updated based on patient status, additional functional criteria and  insurance authorization.    Recommendations  Diet recommendations: Dysphagia 3 (mechanical soft);Thin liquid Liquids provided via: Cup;No straw Medication Administration: Whole meds with puree Supervision: Patient able to self feed;Intermittent supervision to cue for compensatory strategies Compensations: Slow rate;Small sips/bites Postural Changes and/or Swallow Maneuvers: Seated upright 90 degrees                Oral Care Recommendations: Oral care BID Follow up Recommendations: Skilled Nursing facility SLP Visit Diagnosis: Dysphagia, oropharyngeal phase (R13.12) Plan: All goals met;Discharge SLP treatment due to (comment)       Corey Cummings I. Corey Cummings, [place], [place] Office number [phone] Pager [phone]                 Corey Cummings  02/05/2021, 11:52 AM

## 2021-02-06 ENCOUNTER — Ambulatory Visit: Payer: Medicare HMO | Admitting: Psychiatry

## 2021-02-06 DIAGNOSIS — N179 Acute kidney failure, unspecified: Secondary | ICD-10-CM | POA: Diagnosis not present

## 2021-02-06 DIAGNOSIS — G9341 Metabolic encephalopathy: Secondary | ICD-10-CM | POA: Diagnosis not present

## 2021-02-06 DIAGNOSIS — Z978 Presence of other specified devices: Secondary | ICD-10-CM | POA: Diagnosis not present

## 2021-02-06 LAB — BASIC METABOLIC PANEL
Anion gap: 5 (ref 5–15)
BUN: 34 mg/dL — ABNORMAL HIGH (ref 8–23)
CO2: 25 mmol/L (ref 22–32)
Calcium: 8.5 mg/dL — ABNORMAL LOW (ref 8.9–10.3)
Chloride: 104 mmol/L (ref 98–111)
Creatinine, Ser: 1.22 mg/dL (ref 0.61–1.24)
GFR, Estimated: 57 mL/min — ABNORMAL LOW (ref >60)
Glucose, Bld: 167 mg/dL — ABNORMAL HIGH (ref 70–99)
Potassium: 4.2 mmol/L (ref 3.5–5.1)
Sodium: 134 mmol/L — ABNORMAL LOW (ref 135–145)

## 2021-02-06 LAB — CBC
HCT: 24.1 % — ABNORMAL LOW (ref 39.0–52.0)
Hemoglobin: 7.6 g/dL — ABNORMAL LOW (ref 13.0–17.0)
MCH: 27.1 pg (ref 26.0–34.0)
MCHC: 31.5 g/dL (ref 30.0–36.0)
MCV: 86.1 fL (ref 80.0–100.0)
Platelets: 287 10*3/uL (ref 150–400)
RBC: 2.8 MIL/uL — ABNORMAL LOW (ref 4.22–5.81)
RDW: 17.2 % — ABNORMAL HIGH (ref 11.5–15.5)
WBC: 6.1 10*3/uL (ref 4.0–10.5)
nRBC: 0 % (ref 0.0–0.2)

## 2021-02-06 LAB — URINALYSIS, ROUTINE W REFLEX MICROSCOPIC
Bilirubin Urine: NEGATIVE
Glucose, UA: NEGATIVE mg/dL
Ketones, ur: NEGATIVE mg/dL
Nitrite: NEGATIVE
Protein, ur: 100 mg/dL — AB
Specific Gravity, Urine: 1.016 (ref 1.005–1.030)
WBC, UA: >50 WBC/hpf — ABNORMAL HIGH (ref 0–5)
pH: 7 (ref 5.0–8.0)

## 2021-02-06 MED ORDER — APIXABAN 5 MG PO TABS
5.0000 mg | ORAL_TABLET | Freq: Two times a day (BID) | ORAL | Status: DC
  Administered 2021-02-06 – 2021-02-08 (×5): 5 mg via ORAL
  Filled 2021-02-06 (×5): qty 1

## 2021-02-06 NOTE — Progress Notes (Signed)
Physical Therapy Treatment Patient Details Name: Corey Cummings MRN: [id number] DOB: [birth year] Today's Date: 02/06/2021   History of Present Illness Pt is an 87 y/o male admitted from SNF on 10/23 secondary to fall and hydronephrosis. PMH includes HTN, chronic indwelling Foley catheter, nephrolithiasis, PE on Xarelto, and BPH.    PT Comments    Pt pleasantly confused with pt incontinent of bowel on arrival and unaware. Pt with decreased standing and gait tolerance today only able to walk limited distance in room and perform HEP. Will continue to follow to maximize function and encouraged OOB daily with nursing staff for OOB to chair.      Recommendations for follow up therapy are one component of a multi-disciplinary discharge planning process, led by the attending physician.  Recommendations may be updated based on patient status, additional functional criteria and insurance authorization.  Follow Up Recommendations  Skilled nursing-short term rehab (<3 hours/day)     Assistance Recommended at Discharge Intermittent Supervision/Assistance  Equipment Recommendations  None recommended by PT    Recommendations for Other Services       Precautions / Restrictions Precautions Precautions: Fall     Mobility  Bed Mobility Overal bed mobility: Needs Assistance Bed Mobility: Rolling;Supine to Sit Rolling: Min assist   Supine to sit: Min assist     General bed mobility comments: rolling with min assist bil for pericare and linen change. Min assist to transition to sitting with single UE support and mod cues    Transfers Overall transfer level: Needs assistance   Transfers: Sit to/from Stand Sit to Stand: Min assist           General transfer comment: min assist to stand with cues for hand placement posterior left lean in standing with assist to correct    Ambulation/Gait Ambulation/Gait assistance: Min assist Gait Distance (Feet): 8 Feet Assistive device: Rolling  walker (2 wheels) Gait Pattern/deviations: Narrow base of support;Trunk flexed;Shuffle   Gait velocity interpretation: <1.31 ft/sec, indicative of household ambulator General Gait Details: pt with reliance on RW as well as physical assist with shuffling gait with flexed trunk. Pt only tolerating 8' then stating he can't go further due to weakness and pain with pt returned to chair with alarm belt and education for use   Stairs             Wheelchair Mobility    Modified Rankin (Stroke Patients Only)       Balance Overall balance assessment: Needs assistance Sitting-balance support: Feet supported;No upper extremity supported Sitting balance-Leahy Scale: Poor Sitting balance - Comments: EOb without support with left lean Postural control: Posterior lean;Left lateral lean Standing balance support: Bilateral upper extremity supported;During functional activity Standing balance-Leahy Scale: Poor Standing balance comment: bil UE support and physical assist for standing balance                            Cognition Arousal/Alertness: Awake/alert Behavior During Therapy: Flat affect Overall Cognitive Status: No family/caregiver present to determine baseline cognitive functioning Area of Impairment: Orientation;Following commands;Problem solving                 Orientation Level: Disoriented to;Situation;Place;Time     Following Commands: Follows one step commands consistently Safety/Judgement: Decreased awareness of deficits;Decreased awareness of safety   Problem Solving: Slow processing;Requires verbal cues General Comments: pt oriented to day not place or situation. pt incontinent of bowel on arrival and unaware. Pt with cues for all mobility  and safety. pt stating he was out last night and dropped something on his leg        Exercises General Exercises - Lower Extremity Long Arc Quad: AROM;Both;Seated;20 reps Hip Flexion/Marching:  AAROM;Both;Seated;20 reps    General Comments        Pertinent Vitals/Pain Pain Score: 5  Pain Location: bil LE Pain Descriptors / Indicators: Aching;Guarding Pain Intervention(s): Limited activity within patient's tolerance;Monitored during session;Repositioned    Home Living                          Prior Function            PT Goals (current goals can now be found in the care plan section) Progress towards PT goals: Progressing toward goals    Frequency    Min 2X/week      PT Plan Current plan remains appropriate    Co-evaluation              AM-PAC PT "6 Clicks" Mobility   Outcome Measure  Help needed turning from your back to your side while in a flat bed without using bedrails?: A Little Help needed moving from lying on your back to sitting on the side of a flat bed without using bedrails?: A Little Help needed moving to and from a bed to a chair (including a wheelchair)?: A Little Help needed standing up from a chair using your arms (e.g., wheelchair or bedside chair)?: A Little Help needed to walk in hospital room?: A Lot Help needed climbing 3-5 steps with a railing? : Total 6 Click Score: 15    End of Session Equipment Utilized During Treatment: Gait belt Activity Tolerance: Patient tolerated treatment well Patient left: in chair;with call bell/phone within reach;with chair alarm set Nurse Communication: Mobility status PT Visit Diagnosis: Unsteadiness on feet (R26.81);Muscle weakness (generalized) (M62.81);Difficulty in walking, not elsewhere classified (R26.2);History of falling (Z91.81);Other abnormalities of gait and mobility (R26.89)     Time: 8502-7741 PT Time Calculation (min) (ACUTE ONLY): 23 min  Charges:  $Therapeutic Exercise: 8-22 mins $Therapeutic Activity: 8-22 mins                     Corey Cummings P, PT Acute Rehabilitation Services Pager: [phone] Office: [phone]    Corey Cummings 02/06/2021, 12:18 PM

## 2021-02-06 NOTE — Progress Notes (Addendum)
PROGRESS NOTE    Corey Cummings  [id number] DOB: 22-Oct-1930 DOA: 01/29/2021 PCP: Jonathon Jordan, MD   Chief Complaint  Patient presents with   Fall   Level 2    Brief Narrative:   Corey Cummings is a 89/M with history of BPH, chronic indwelling Foley, history of obstructive nephrolithiasis status post stenting, history of pulmonary embolism in 11/21 on Eliquis, chronic iron deficiency anemia was sent to the ED from SNF with worsening mental status and a fall. -Upon work-up in the emergency room he was found to have gross hematuria, urinary retention, clogged Foley catheter and acute kidney injury along with encephalopathy  Assessment & Plan:  Gross hematuria -Multifactorial due to large prostate, anticoagulation and catheter trauma -Urology consulting, recommended to hold anticoagulation temporarily, irrigate every 6 hours and as needed for clots -Has chronic hydronephrosis on imaging, anticipate improvement now that the catheter was replaced and draining, ureteral stent was not recommended at this time -Continue with Flomax and proscar -Urine culture<10K colonies only, recently completed 7-day course of antibiotics for UTI PTA -Hematuria had cleared earlier, was restarted on Eliquis 10/26 unfortunately had recurrence of hematuria, Eliquis subsequently held again, bladder irrigation was resumed  -Bleeding has cleared again, restarted on Eliquis yesterday, had brief hematuria in the afternoon which has now resolved, continue anticoagulation, discharge to SNF tomorrow if remains clear -d/w daughter she is interested in having palliative care discussions due to ongoing decline, fluctuating mental status, poor p.o. intake and multiple complications  AKI -Probably multifactorial, prerenal, postrenal, in the setting of obstructive uropathy from clogged Foley catheter, and poor oral intake/appetite from his illness and encephalopathy.  . -Creatinine is improving, fluids  discontinued -Obstruction resolved  Acute blood loss anemia -Hemoglobin is 7.2 on admission, received 1 unit PRBC with good response -Slight downtrend in hemoglobin noted, hematuria appears to be clearing, recheck  History of PE -See discussion above, restarted Eliquis  Acute metabolic encephalopathy,Delirium -Likely from AKI, mild uremia,  -Improved  Hypomagnesemia -Repleted   Hyperkalemia -Resolved with lokelma   Leukocytosis -Recent UTI, completed 7 days of p.o. antibiotics recently. -Probably related to the AKI and acute hematuria/blood loss, will monitor off antibiotics for now.   HTN -Hold home BP medication, as needed hydralazine for now.   BPH, chronic ureteral obstruction on indwelling Foley -Foley was exchanged on admission..  DVT prophylaxis: eliquis Code Status: Full Family Communication: No family at bedside, called and updated daughter 10/28 Disposition:   Status is: Inpatient  Remains inpatient appropriate:  Disposition: SNF hopefully tomorrow    Consultants:  Urology   Subjective: -Had some here hematuria yesterday afternoon, clear again Objective: Vitals:   02/05/21 0421 02/05/21 1721 02/05/21 2017 02/06/21 0541  BP: 125/81 137/72 126/72 117/74  Pulse: 100 91 93 96  Resp: 20 17 18 16   Temp: 97.9 F (36.6 C) 98.1 F (36.7 C) 98.2 F (36.8 C) 97.9 F (36.6 C)  TempSrc:  Oral Oral Oral  SpO2: 98% 100% 99% 96%  Weight:      Height:        Intake/Output Summary (Last 24 hours) at 02/06/2021 1228 Last data filed at 02/06/2021 0541 Gross per 24 hour  Intake --  Output 1350 ml  Net -1350 ml   Filed Weights   01/29/21 0901  Weight: 56.7 kg    Examination:  Gen: Chronically ill elderly male laying in bed, awake alert oriented to self and place, cognitive deficits noted, hard of hearing HEENT: No JVD CVS: S1-S2, regular rate rhythm  Lungs: Clear bilaterally Abdomen: Soft, nontender, bowel sounds present Extremities: No  edema Skin: no new rashes on exposed skin  GU: Foley catheter with clear urine today  Data Reviewed: I have personally reviewed following labs and imaging studies  CBC: Recent Labs  Lab 02/01/21 0606 02/02/21 0153 02/03/21 0245 02/05/21 0135 02/06/21 0428  WBC 6.2 6.0 6.7 5.5 6.1  HGB 8.6* 8.6* 8.6* 8.0* 7.6*  HCT 26.7* 27.2* 27.7* 25.6* 24.1*  MCV 85.3 85.0 86.3 86.5 86.1  PLT 246 241 223 249 [id number]    Basic Metabolic Panel: Recent Labs  Lab 01/31/21 0141 02/01/21 0606 02/02/21 0153 02/03/21 0245 02/05/21 0135 02/06/21 0428  NA 140 138 136 137 136 134*  K 3.7 3.9 3.8 4.1 4.0 4.2  CL 108 106 105 106 106 104  CO2 25 26 25 26 24 25   GLUCOSE 100* 145* 145* 109* 147* 167*  BUN 33* 21 21 24* 32* 34*  CREATININE 1.25* 0.98 1.20 1.38* 1.17 1.22  CALCIUM 8.0* 8.2* 8.1* 8.3* 8.4* 8.5*  MG 1.6* 1.7  --   --   --   --   PHOS 3.3  --   --   --   --   --     GFR: Estimated Creatinine Clearance: 33.6 mL/min (by C-G formula based on SCr of 1.22 mg/dL).  Liver Function Tests: No results for input(s): AST, ALT, ALKPHOS, BILITOT, PROT, ALBUMIN in the last 168 hours.  CBG: Recent Labs  Lab 01/30/21 2128  GLUCAP 82     Recent Results (from the past 240 hour(s))  Resp Panel by RT-PCR (Flu A&B, Covid) Nasopharyngeal Swab     Status: None   Collection Time: 01/29/21 11:01 AM   Specimen: Nasopharyngeal Swab; Nasopharyngeal(NP) swabs in vial transport medium  Result Value Ref Range Status   SARS Coronavirus 2 by RT PCR NEGATIVE NEGATIVE Final    Comment: (NOTE) SARS-CoV-2 target nucleic acids are NOT DETECTED.  The SARS-CoV-2 RNA is generally detectable in upper respiratory specimens during the acute phase of infection. The lowest concentration of SARS-CoV-2 viral copies this assay can detect is 138 copies/mL. A negative result does not preclude SARS-Cov-2 infection and should not be used as the sole basis for treatment or other patient management decisions. A negative  result may occur with  improper specimen collection/handling, submission of specimen other than nasopharyngeal swab, presence of viral mutation(s) within the areas targeted by this assay, and inadequate number of viral copies(<138 copies/mL). A negative result must be combined with clinical observations, patient history, and epidemiological information. The expected result is Negative.  Fact Sheet for Patients:  EntrepreneurPulse.com.au  Fact Sheet for Healthcare Providers:  IncredibleEmployment.be  This test is no t yet approved or cleared by the [place] FDA and  has been authorized for detection and/or diagnosis of SARS-CoV-2 by FDA under an Emergency Use Authorization (EUA). This EUA will remain  in effect (meaning this test can be used) for the duration of the COVID-19 declaration under Section 564(b)(1) of the Act, 21 U.S.C.section 360bbb-3(b)(1), unless the authorization is terminated  or revoked sooner.       Influenza A by PCR NEGATIVE NEGATIVE Final   Influenza B by PCR NEGATIVE NEGATIVE Final    Comment: (NOTE) The Xpert Xpress SARS-CoV-2/FLU/RSV plus assay is intended as an aid in the diagnosis of influenza from Nasopharyngeal swab specimens and should not be used as a sole basis for treatment. Nasal washings and aspirates are unacceptable for Xpert Xpress SARS-CoV-2/FLU/RSV testing.  Fact Sheet for Patients: BloggerCourse.com  Fact Sheet for Healthcare Providers: SeriousBroker.it  This test is not yet approved or cleared by the [place] FDA and has been authorized for detection and/or diagnosis of SARS-CoV-2 by FDA under an Emergency Use Authorization (EUA). This EUA will remain in effect (meaning this test can be used) for the duration of the COVID-19 declaration under Section 564(b)(1) of the Act, 21 U.S.C. section 360bbb-3(b)(1), unless the authorization is  terminated or revoked.  Performed at [facility], [address], [place], [place] [address]   Urine Culture     Status: Abnormal   Collection Time: 01/29/21 11:45 AM   Specimen: Urine, Catheterized  Result Value Ref Range Status   Specimen Description URINE, CATHETERIZED  Final   Special Requests NONE  Final   Culture (A)  Final    <10,000 COLONIES/mL INSIGNIFICANT GROWTH Performed at [facility], [address], [place], [place] [address]    Report Status 01/30/2021 FINAL  Final     Scheduled Meds:  apixaban  5 mg Oral BID   Chlorhexidine Gluconate Cloth  6 each Topical Daily   feeding supplement  237 mL Oral TID BM   finasteride  5 mg Oral QHS   mouth rinse  15 mL Mouth Rinse BID   multivitamin with minerals  1 tablet Oral Daily   tamsulosin  0.4 mg Oral Daily   thiamine  100 mg Oral Daily   Continuous Infusions:   LOS: 8 days    Zannie Cove, MD [facility]   02/06/2021, 12:28 PM

## 2021-02-06 NOTE — TOC Progression Note (Signed)
Transition of Care [facility]) - Progression Note    Patient Details  Name: Corey Cummings MRN: [id number] Date of Birth: [birth year]  Transition of Care [facility]) CM/SW Contact  Beckie Busing, RN Phone Number: 02/06/2021, 2:59 PM  Clinical Narrative:   CM spoke with Aggie Cosier at [facility]. Insurance Berkley Harvey is still pending.    Expected Discharge Plan: Skilled Nursing Facility Barriers to Discharge: Continued Medical Work up  Expected Discharge Plan and Services Expected Discharge Plan: Skilled Nursing Facility In-house Referral: NA Discharge Planning Services: CM Consult Post Acute Care Choice: Skilled Nursing Facility Living arrangements for the past 2 months: Skilled Nursing Facility                 DME Arranged: N/A DME Agency: NA       HH Arranged: NA HH Agency: NA         Social Determinants of Health (SDOH) Interventions    Readmission Risk Interventions Readmission Risk Prevention Plan 02/01/2021 01/05/2021  Transportation Screening Complete Complete  Medication Review [employer]) Complete Complete  PCP or Specialist appointment within 3-5 days of discharge Complete Complete  HRI or Home Care Consult Complete Complete  SW Recovery Care/Counseling Consult Complete Complete  Palliative Care Screening Not Applicable -  Skilled Nursing Facility Complete Complete  Some recent data might be hidden

## 2021-02-06 NOTE — Plan of Care (Signed)

## 2021-02-07 DIAGNOSIS — G9341 Metabolic encephalopathy: Secondary | ICD-10-CM | POA: Diagnosis not present

## 2021-02-07 DIAGNOSIS — N179 Acute kidney failure, unspecified: Secondary | ICD-10-CM | POA: Diagnosis not present

## 2021-02-07 DIAGNOSIS — Z978 Presence of other specified devices: Secondary | ICD-10-CM | POA: Diagnosis not present

## 2021-02-07 LAB — CBC
HCT: 28.7 % — ABNORMAL LOW (ref 39.0–52.0)
Hemoglobin: 8.9 g/dL — ABNORMAL LOW (ref 13.0–17.0)
MCH: 26.7 pg (ref 26.0–34.0)
MCHC: 31 g/dL (ref 30.0–36.0)
MCV: 86.2 fL (ref 80.0–100.0)
Platelets: 389 10*3/uL (ref 150–400)
RBC: 3.33 MIL/uL — ABNORMAL LOW (ref 4.22–5.81)
RDW: 17 % — ABNORMAL HIGH (ref 11.5–15.5)
WBC: 8.9 10*3/uL (ref 4.0–10.5)
nRBC: 0 % (ref 0.0–0.2)

## 2021-02-07 MED ORDER — SODIUM CHLORIDE 0.9 % IV SOLN
1.0000 g | INTRAVENOUS | Status: DC
  Administered 2021-02-07 – 2021-02-08 (×2): 1 g via INTRAVENOUS
  Filled 2021-02-07 (×2): qty 10

## 2021-02-07 NOTE — Progress Notes (Signed)
Occupational Therapy Treatment Patient Details Name: Corey Cummings MRN: [id number] DOB: 29-Mar-1931 Today's Date: 02/07/2021   History of present illness Pt is an 87 y/o male admitted from SNF on 10/23 secondary to fall and hydronephrosis. PMH includes HTN, chronic indwelling Foley catheter, nephrolithiasis, PE on Xarelto, and BPH.   OT comments  Patient supine in bed and requires mod assist to transition to EOB today.  Sitting EOB with R lateral lean, up to min assist to maintain balance while engaging in grooming tasks with setup (wash face and hands).  Pt fatigued and once completed with these tasks, he laid back down requiring min assist for LB support.  He remains pleasantly confused, only oriented to self today--reports at his home by the piano.  He is able to follow simple commands, but requires multimodal cueing with increased time.  Will follow acutely, SNF remains appropriate.     Recommendations for follow up therapy are one component of a multi-disciplinary discharge planning process, led by the attending physician.  Recommendations may be updated based on patient status, additional functional criteria and insurance authorization.    Follow Up Recommendations  Skilled nursing-short term rehab (<3 hours/day)    Assistance Recommended at Discharge Frequent or constant Supervision/Assistance  Equipment Recommendations  [facility]    Recommendations for Other Services      Precautions / Restrictions Precautions Precautions: Fall Precaution Comments: B hand mits in place Restrictions Weight Bearing Restrictions: No       Mobility Bed Mobility Overal bed mobility: Needs Assistance Bed Mobility: Rolling;Supine to Sit;Sit to Supine Rolling: Min assist   Supine to sit: Mod assist Sit to supine: Min assist   General bed mobility comments: pt requires min assist to roll in bed to adjust pads, mod assist for trunk support and scooting foward towards EOB    Transfers                          Balance Overall balance assessment: Needs assistance Sitting-balance support: Feet supported;No upper extremity supported Sitting balance-Leahy Scale: Poor Sitting balance - Comments: EOB with R lateral lean                                   ADL either performed or assessed with clinical judgement   ADL Overall ADL's : Needs assistance/impaired     Grooming: Wash/dry hands;Wash/dry face;Minimal assistance;Sitting Grooming Details (indicate cue type and reason): sitting EOB, requires assist to maintain upright sitting and cueing to attend to task             Lower Body Dressing: Total assistance;Bed level     Toilet Transfer Details (indicate cue type and reason): deferred today         Functional mobility during ADLs: Moderate assistance General ADL Comments: limited to EOB, due to weakness, cognition and balance     Vision       Perception     Praxis      Cognition Arousal/Alertness: Awake/alert Behavior During Therapy: Flat affect Overall Cognitive Status: No family/caregiver present to determine baseline cognitive functioning Area of Impairment: Orientation;Following commands;Problem solving;Awareness;Safety/judgement;Memory;Attention                 Orientation Level: Disoriented to;Place;Time;Situation Current Attention Level: Focused Memory: Decreased recall of precautions;Decreased short-term memory Following Commands: Follows one step commands inconsistently;Follows one step commands with increased time Safety/Judgement: Decreased awareness of safety;Decreased awareness  of deficits Awareness: Intellectual Problem Solving: Slow processing;Decreased initiation;Difficulty sequencing;Requires verbal cues;Requires tactile cues General Comments: pt oriented to self only, requires increased time and mulitmodal cueing to follow simple commands; at times difficult to understand but reports he is "at home by the piano"           Exercises     Shoulder Instructions       General Comments      Pertinent Vitals/ Pain       Pain Assessment: Faces Faces Pain Scale: No hurt Pain Intervention(s): Monitored during session  Home Living                                          Prior Functioning/Environment              Frequency  Min 2X/week        Progress Toward Goals  OT Goals(current goals can now be found in the care plan section)  Progress towards OT goals: Progressing toward goals  Acute Rehab OT Goals Patient Stated Goal: none stated OT Goal Formulation: Patient unable to participate in goal setting Time For Goal Achievement: 02/16/21 Potential to Achieve Goals: Fair  Plan Discharge plan remains appropriate;Frequency remains appropriate    Co-evaluation                 AM-PAC OT "6 Clicks" Daily Activity     Outcome Measure   Help from another person eating meals?: A Lot Help from another person taking care of personal grooming?: A Little Help from another person toileting, which includes using toliet, bedpan, or urinal?: Total Help from another person bathing (including washing, rinsing, drying)?: A Lot Help from another person to put on and taking off regular upper body clothing?: A Lot Help from another person to put on and taking off regular lower body clothing?: Total 6 Click Score: 11    End of Session    OT Visit Diagnosis: Other abnormalities of gait and mobility (R26.89);Muscle weakness (generalized) (M62.81);Other symptoms and signs involving cognitive function   Activity Tolerance Patient limited by fatigue   Patient Left in bed;with call bell/phone within reach;with bed alarm set;with restraints reapplied   Nurse Communication Mobility status        Time: 6945-0388 OT Time Calculation (min): 19 min  Charges: OT General Charges $OT Visit: 1 Visit OT Treatments $Self Care/Home Management : 8-22 mins  Barry Brunner, OT Acute  Rehabilitation Services Pager [phone] Office [phone]   Chancy Milroy 02/07/2021, 10:11 AM

## 2021-02-07 NOTE — Plan of Care (Signed)

## 2021-02-07 NOTE — Progress Notes (Signed)
PROGRESS NOTE    Corey Cummings  [id number] DOB: [birth year] DOA: 01/29/2021 PCP: Jonathon Jordan, MD   Chief Complaint  Patient presents with   Fall   Level 2    Brief Narrative:   Valeda Malm is a 89/M with history of BPH, chronic indwelling Foley, history of obstructive nephrolithiasis status post stenting, history of pulmonary embolism in 11/21 on Eliquis, chronic iron deficiency anemia was sent to the ED from SNF with worsening mental status and a fall. -Upon work-up in the emergency room he was found to have gross hematuria, urinary retention, clogged Foley catheter and acute kidney injury along with encephalopathy. -Seen by urology in consultation, Eliquis was held initially, clinically improved with bladder irrigation, was restarted on Eliquis, hematuria returned, Eliquis held again, hematuria finally clearing up again, however now with thick white sediment in the catheter and tubing, ? UTI  Assessment & Plan:  Gross hematuria, chronic Foley, BPH -Multifactorial due to large prostate, anticoagulation and catheter trauma -Urology consulting, recommended to hold anticoagulation temporarily, irrigate every 6 hours and as needed for clots -Has chronic hydronephrosis on imaging, anticipate improvement now that the catheter was replaced and draining, ureteral stent was not recommended at this time -Continue with Flomax and proscar -Urine culture<10K colonies only, recently completed 7-day course of antibiotics for UTI PTA -Hematuria had cleared earlier, was restarted on Eliquis 10/26 unfortunately had recurrence of hematuria, Eliquis subsequently held again, bladder irrigation was resumed  -Bleeding has cleared again, restarted on Eliquis 10/30, had brief hematuria that afternoon which has now resolved, continue anticoagulation -Now with dense tan residue in the catheter tubing and bag, pyuria -d/w daughter she is interested in having palliative care discussions due to ongoing  decline, fluctuating mental status, poor p.o. intake and multiple complications  Pyuria -Yesterday evening 10/31 he was noted to have thick white residue in his urine, today-Foley bag bottom third with tan sediment, will start IV ceftriaxone, check urine cultures -change catheter  AKI -Probably multifactorial, prerenal, postrenal, in the setting of obstructive uropathy from clogged Foley catheter, and poor oral intake/appetite from his illness and encephalopathy.  . -Creatinine is improving, fluids discontinued -Obstruction resolved  Acute blood loss anemia -Hemoglobin is 7.2 on admission, received 1 unit PRBC with good response -Hemoglobin stable  History of PE -See discussion above, restarted Eliquis  Acute metabolic encephalopathy,Delirium -Likely from AKI, mild uremia,  -Improved  Hypomagnesemia -Repleted   Hyperkalemia -Resolved with lokelma   HTN -Hold home BP medication, as needed hydralazine for now.   BPH, chronic ureteral obstruction on indwelling Foley -Foley was exchanged on admission..  DVT prophylaxis: eliquis Code Status: Full Family Communication: No family at bedside, called and updated daughter 10/26, 10/28, 10/31 Disposition:   Status is: Inpatient  Remains inpatient appropriate:  Disposition: SNF hopefully in 1-2days    Consultants:  Urology   Subjective: -No further hematuria, yesterday evening onwards started having thick tan residue in catheter tubing and bag Objective: Vitals:   02/05/21 0421 02/05/21 1721 02/05/21 2017 02/06/21 0541  BP: 125/81 137/72 126/72 117/74  Pulse: 100 91 93 96  Resp: 20 17 18 16   Temp: 97.9 F (36.6 C) 98.1 F (36.7 C) 98.2 F (36.8 C) 97.9 F (36.6 C)  TempSrc:  Oral Oral Oral  SpO2: 98% 100% 99% 96%  Weight:      Height:        Intake/Output Summary (Last 24 hours) at 02/07/2021 1217 Last data filed at 02/06/2021 2053 Gross per 24 hour  Intake  60 ml  Output --  Net 60 ml   Filed Weights    01/29/21 0901  Weight: 56.7 kg    Examination:  Gen: Chronically ill elderly male laying in bed, awake alert oriented to self and place, cognitive deficits noted, hard of hearing HEENT: no JVD Lungs: Poor air movement CVS: S1S2/RRR Abd: soft, Non tender, non distended, BS present Extremities: No edema Skin: no new rashes on exposed skin  GU: Foley catheter with concentrated urine and thick tan residue  Data Reviewed: I have personally reviewed following labs and imaging studies  CBC: Recent Labs  Lab 02/02/21 0153 02/03/21 0245 02/05/21 0135 02/06/21 0428 02/07/21 0315  WBC 6.0 6.7 5.5 6.1 8.9  HGB 8.6* 8.6* 8.0* 7.6* 8.9*  HCT 27.2* 27.7* 25.6* 24.1* 28.7*  MCV 85.0 86.3 86.5 86.1 86.2  PLT 241 223 249 287 389    Basic Metabolic Panel: Recent Labs  Lab 02/01/21 0606 02/02/21 0153 02/03/21 0245 02/05/21 0135 02/06/21 0428  NA 138 136 137 136 134*  K 3.9 3.8 4.1 4.0 4.2  CL 106 105 106 106 104  CO2 26 25 26 24 25   GLUCOSE 145* 145* 109* 147* 167*  BUN 21 21 24* 32* 34*  CREATININE 0.98 1.20 1.38* 1.17 1.22  CALCIUM 8.2* 8.1* 8.3* 8.4* 8.5*  MG 1.7  --   --   --   --     GFR: Estimated Creatinine Clearance: 33.6 mL/min (by C-G formula based on SCr of 1.22 mg/dL).  Liver Function Tests: No results for input(s): AST, ALT, ALKPHOS, BILITOT, PROT, ALBUMIN in the last 168 hours.  CBG: No results for input(s): GLUCAP in the last 168 hours.    Recent Results (from the past 240 hour(s))  Resp Panel by RT-PCR (Flu A&B, Covid) Nasopharyngeal Swab     Status: None   Collection Time: 01/29/21 11:01 AM   Specimen: Nasopharyngeal Swab; Nasopharyngeal(NP) swabs in vial transport medium  Result Value Ref Range Status   SARS Coronavirus 2 by RT PCR NEGATIVE NEGATIVE Final    Comment: (NOTE) SARS-CoV-2 target nucleic acids are NOT DETECTED.  The SARS-CoV-2 RNA is generally detectable in upper respiratory specimens during the acute phase of infection. The  lowest concentration of SARS-CoV-2 viral copies this assay can detect is 138 copies/mL. A negative result does not preclude SARS-Cov-2 infection and should not be used as the sole basis for treatment or other patient management decisions. A negative result may occur with  improper specimen collection/handling, submission of specimen other than nasopharyngeal swab, presence of viral mutation(s) within the areas targeted by this assay, and inadequate number of viral copies(<138 copies/mL). A negative result must be combined with clinical observations, patient history, and epidemiological information. The expected result is Negative.  Fact Sheet for Patients:  01/31/21  Fact Sheet for Healthcare Providers:  BloggerCourse.com  This test is no t yet approved or cleared by the [employer] FDA and  has been authorized for detection and/or diagnosis of SARS-CoV-2 by FDA under an Emergency Use Authorization (EUA). This EUA will remain  in effect (meaning this test can be used) for the duration of the COVID-19 declaration under Section 564(b)(1) of the Act, 21 U.S.C.section 360bbb-3(b)(1), unless the authorization is terminated  or revoked sooner.       Influenza A by PCR NEGATIVE NEGATIVE Final   Influenza B by PCR NEGATIVE NEGATIVE Final    Comment: (NOTE) The Xpert Xpress SARS-CoV-2/FLU/RSV plus assay is intended as an aid in the diagnosis  of influenza from Nasopharyngeal swab specimens and should not be used as a sole basis for treatment. Nasal washings and aspirates are unacceptable for Xpert Xpress SARS-CoV-2/FLU/RSV testing.  Fact Sheet for Patients: EntrepreneurPulse.com.au  Fact Sheet for Healthcare Providers: IncredibleEmployment.be  This test is not yet approved or cleared by the [place] FDA and has been authorized for detection and/or diagnosis of SARS-CoV-2 by FDA under  an Emergency Use Authorization (EUA). This EUA will remain in effect (meaning this test can be used) for the duration of the COVID-19 declaration under Section 564(b)(1) of the Act, 21 U.S.C. section 360bbb-3(b)(1), unless the authorization is terminated or revoked.  Performed at [facility], [place] [address], [place], [place] [address]   Urine Culture     Status: Abnormal   Collection Time: 01/29/21 11:45 AM   Specimen: Urine, Catheterized  Result Value Ref Range Status   Specimen Description URINE, CATHETERIZED  Final   Special Requests NONE  Final   Culture (A)  Final    <10,000 COLONIES/mL INSIGNIFICANT GROWTH Performed at [facility], [place] [address], [place], [place] [address]    Report Status 01/30/2021 FINAL  Final     Scheduled Meds:  apixaban  5 mg Oral BID   Chlorhexidine Gluconate Cloth  6 each Topical Daily   feeding supplement  237 mL Oral TID BM   finasteride  5 mg Oral QHS   mouth rinse  15 mL Mouth Rinse BID   multivitamin with minerals  1 tablet Oral Daily   tamsulosin  0.4 mg Oral Daily   thiamine  100 mg Oral Daily   Continuous Infusions:  cefTRIAXone (ROCEPHIN)  IV 1 g (02/07/21 0841)    LOS: 9 days    Domenic Polite, MD [facility]   02/07/2021, 12:17 PM

## 2021-02-08 DIAGNOSIS — I10 Essential (primary) hypertension: Secondary | ICD-10-CM | POA: Diagnosis not present

## 2021-02-08 DIAGNOSIS — W19XXXA Unspecified fall, initial encounter: Secondary | ICD-10-CM | POA: Diagnosis not present

## 2021-02-08 DIAGNOSIS — Z66 Do not resuscitate: Secondary | ICD-10-CM

## 2021-02-08 DIAGNOSIS — Z978 Presence of other specified devices: Secondary | ICD-10-CM | POA: Diagnosis not present

## 2021-02-08 DIAGNOSIS — Z515 Encounter for palliative care: Secondary | ICD-10-CM

## 2021-02-08 DIAGNOSIS — N390 Urinary tract infection, site not specified: Secondary | ICD-10-CM

## 2021-02-08 DIAGNOSIS — Z7189 Other specified counseling: Secondary | ICD-10-CM

## 2021-02-08 DIAGNOSIS — N179 Acute kidney failure, unspecified: Secondary | ICD-10-CM | POA: Diagnosis not present

## 2021-02-08 DIAGNOSIS — G9341 Metabolic encephalopathy: Secondary | ICD-10-CM | POA: Diagnosis not present

## 2021-02-08 LAB — BASIC METABOLIC PANEL
Anion gap: 8 (ref 5–15)
BUN: 38 mg/dL — ABNORMAL HIGH (ref 8–23)
CO2: 23 mmol/L (ref 22–32)
Calcium: 8.8 mg/dL — ABNORMAL LOW (ref 8.9–10.3)
Chloride: 105 mmol/L (ref 98–111)
Creatinine, Ser: 1.38 mg/dL — ABNORMAL HIGH (ref 0.61–1.24)
GFR, Estimated: 49 mL/min — ABNORMAL LOW (ref >60)
Glucose, Bld: 106 mg/dL — ABNORMAL HIGH (ref 70–99)
Potassium: 4.3 mmol/L (ref 3.5–5.1)
Sodium: 136 mmol/L (ref 135–145)

## 2021-02-08 LAB — CBC
HCT: 26.4 % — ABNORMAL LOW (ref 39.0–52.0)
Hemoglobin: 8.3 g/dL — ABNORMAL LOW (ref 13.0–17.0)
MCH: 27.2 pg (ref 26.0–34.0)
MCHC: 31.4 g/dL (ref 30.0–36.0)
MCV: 86.6 fL (ref 80.0–100.0)
Platelets: 389 10*3/uL (ref 150–400)
RBC: 3.05 MIL/uL — ABNORMAL LOW (ref 4.22–5.81)
RDW: 17 % — ABNORMAL HIGH (ref 11.5–15.5)
WBC: 4.5 10*3/uL (ref 4.0–10.5)
nRBC: 0 % (ref 0.0–0.2)

## 2021-02-08 MED ORDER — LORAZEPAM 2 MG/ML PO CONC: 1.0000 mg | ORAL | Status: DC | PRN

## 2021-02-08 MED ORDER — ENSURE ENLIVE PO LIQD: 237.0000 mL | ORAL | Status: DC | PRN

## 2021-02-08 MED ORDER — GLYCOPYRROLATE 0.2 MG/ML IJ SOLN: 0.2000 mg | INTRAMUSCULAR | Status: DC | PRN

## 2021-02-08 MED ORDER — ACETAMINOPHEN 325 MG PO TABS: 650.0000 mg | ORAL_TABLET | Freq: Four times a day (QID) | ORAL | Status: DC | PRN

## 2021-02-08 MED ORDER — MORPHINE SULFATE (PF) 2 MG/ML IV SOLN: 1.0000 mg | INTRAVENOUS | Status: DC | PRN

## 2021-02-08 MED ORDER — ACETAMINOPHEN 650 MG RE SUPP: 650.0000 mg | Freq: Four times a day (QID) | RECTAL | Status: DC | PRN

## 2021-02-08 MED ORDER — HALOPERIDOL 1 MG PO TABS: 2.0000 mg | ORAL_TABLET | ORAL | Status: DC | PRN

## 2021-02-08 MED ORDER — POLYVINYL ALCOHOL 1.4 % OP SOLN
1.0000 [drp] | Freq: Four times a day (QID) | OPHTHALMIC | Status: DC | PRN
  Filled 2021-02-08: qty 15

## 2021-02-08 MED ORDER — BIOTENE DRY MOUTH MT LIQD: 15.0000 mL | OROMUCOSAL | Status: DC | PRN

## 2021-02-08 MED ORDER — GLYCOPYRROLATE 1 MG PO TABS: 1.0000 mg | ORAL_TABLET | ORAL | Status: DC | PRN

## 2021-02-08 MED ORDER — HALOPERIDOL LACTATE 5 MG/ML IJ SOLN: 2.0000 mg | INTRAMUSCULAR | Status: DC | PRN

## 2021-02-08 MED ORDER — HALOPERIDOL LACTATE 2 MG/ML PO CONC
2.0000 mg | ORAL | Status: DC | PRN
  Filled 2021-02-08: qty 1

## 2021-02-08 MED ORDER — LORAZEPAM 2 MG/ML IJ SOLN: 1.0000 mg | INTRAMUSCULAR | Status: DC | PRN

## 2021-02-08 MED ORDER — GLYCOPYRROLATE 0.2 MG/ML IJ SOLN
0.2000 mg | INTRAMUSCULAR | Status: DC | PRN
  Administered 2021-02-08: 0.2 mg via INTRAVENOUS
  Filled 2021-02-08: qty 1

## 2021-02-08 MED ORDER — LORAZEPAM 1 MG PO TABS: 1.0000 mg | ORAL_TABLET | ORAL | Status: DC | PRN

## 2021-02-08 NOTE — Progress Notes (Signed)
Nutrition Follow-up  DOCUMENTATION CODES:  Severe malnutrition in context of chronic illness, Underweight  INTERVENTION:  -Continue Ensure Enlive po TID, each supplement provides 350 kcal and 20 grams of protein -Continue MVI with minerals daily  NUTRITION DIAGNOSIS:  Severe Malnutrition related to chronic illness as evidenced by severe fat depletion, severe muscle depletion, percent weight loss. ongoing  GOAL:  Patient will meet greater than or equal to 90% of their needs progressing  MONITOR:  PO intake, Supplement acceptance, Diet advancement, Labs, Weight trends, I & O's, Skin  REASON FOR ASSESSMENT:  Malnutrition Screening Tool, Consult Assessment of nutrition requirement/status  ASSESSMENT:  Pt with PMH significant of BPH with chronic indwelling Foley catheter, obstructive renal stone s/p stenting, HTN, PE November 21 on Eliquis, chronic iron deficiency anemia on iron supplement, was sent from SNF for AMS and fall. Patient was noted to be confused in ED, his work-up was significant for Meta urea, with urinary retention with clogged Foley catheter, AKI with a creatinine of 2.4, hematuria found to have Acute metabolic encephalopathy.  Pt noted to have gross hematuria with pyuria and now new UTI and was started on IV abx. However, AKI, AMS, and electrolyte abnormalities have resolved. Note that pt's daughter has requested to speak with PMT given pt's ongoing decline and poor functional status.   Pt nonverbal this morning and providing no responses to RD questions at this time. Discussed with RN who reports pt's meal intake has been varied, but states pt does well with ONS. Continue with current nutrition plan of care and monitor for results of GOC discussion(s).   PO intake: 20-60% x 4 recorded meals since last RD assessment (45% average meal intake)  Medications: Ensure Enlive/Plus TID, thiamine, mvi with minerals, IV abx Labs reviewed. Vitamin C and Zinc WNL.   UOP: x24  hours I/O: - since admit  Diet Order:   Diet Order             DIET DYS 3 Room service appropriate? Yes with Assist; Fluid consistency: Thin  Diet effective now                  EDUCATION NEEDS:  No education needs have been identified at this time  Skin:  Skin Assessment: Skin Integrity Issues: Skin Integrity Issues:: Stage III, Incisions Stage III: coccyx Incisions: penis (9/27)  Last BM:  11/01  Height:  Ht Readings from Last 1 Encounters:  01/29/21 5\' 9"  (1.753 m)   Weight:  Wt Readings from Last 10 Encounters:  01/29/21 56.7 kg  01/12/21 56.7 kg  01/02/21 57 kg  12/25/20 57 kg  12/10/20 66 kg  12/05/20 67 kg  02/27/20 68 kg   BMI:  Body mass index is 18.46 kg/m.  Estimated Nutritional Needs:  Kcal:  1850-2050 Protein:  90-105 grams Fluid:  >1.8L    02/29/20, MS, RD, LDN (she/her/hers) RD pager number and weekend/on-call pager number located in Amion.

## 2021-02-08 NOTE — Plan of Care (Signed)

## 2021-02-08 NOTE — Progress Notes (Signed)

## 2021-02-08 NOTE — Progress Notes (Signed)
[facility] PROGRESS NOTE    Progress Note  Corey Cummings  [id number] DOB: Jun 26, 1930 DOA: 01/29/2021 PCP: Jonathon Jordan, MD     Brief Narrative:   Corey Cummings is an 87 y.o. male past medical history of BPH with a chronic indwelling Foley catheter, obstructive nephrolithiasis status post stenting with a history of PE on November 2021 on Eliquis, chronic iron deficiency anemia sent to the ED from skilled nursing facility due to worsening mental status and fall.  In the ED he was found to have gross hematuria with urinary retention Foley catheter was clogged which probably led to his acute kidney injury along with encephalopathy.  Urology was consulted recommended to hold Eliquis perform bladder irrigation, hematuria fairly clear up however now there is thick white sediment in the catheter and tubing    Assessment/Plan:   Gross hematuria/chronic indwelling Foley catheter/BPH: Multifactorial due to large prostate anticoagulation and catheter trauma. Urology was consulted to perform CBI.  He has chronic hydronephrosis on imaging Urology recommended to continue Flomax and Proscar. Urine culture in 10,000 colonies, he completed recently a 7-day course of antibiotics. Hematuria has cleared he was restarted on Eliquis on 02/01/2021 unfortunately had a recurrent hematuria, subsequently Eliquis had to be held and bladder irrigation resumed. Hematuria cleared he was restarted back on his Eliquis on 02/05/2021 and his urine has remained clear. Previous physician spoke with daughter and she would like to meet with palliative care due to ongoing decline and poor functional status.  Pyuria: 02/06/2021 was noted to have a thick urine was started on IV Rocephin. Urine culture grew gram-negative rods has remained afebrile continue antibiotic coverage. Will await urine culture speciation and sensitivities.  Acute kidney injury injury: Multifactorial prerenal and postrenal in the setting of  obstructive uropathy and poor oral intake in the setting of encephalopathy. Started on IV fluids creatinine improved. Creatinine appears to be at baseline.  Acute blood loss anemia: With a hemoglobin of 7.2 on admission status post 1 unit of packed red blood cells good response.  History of PE: Continue Eliquis.  Acute metabolic encephalopathy/acute confusional state: Likely due to acute kidney injury now resolved.  Hypomagnesemia: Repleted now resolved.  Hyperkalemia: Likely due to acute kidney injury resolved with Lokelma.  Essential hypertension: Blood pressures relatively well controlled off antihypertensive medication we will continue to monitor.  Stage III sacral decubitus ulcer present on admission RN Pressure Injury Documentation: Pressure Injury 12/23/20 Coccyx Medial Stage 3 -  Full thickness tissue loss. Subcutaneous fat may be visible but bone, tendon or muscle are NOT exposed. (Active)  12/23/20 2020  Location: Coccyx  Location Orientation: Medial  Staging: Stage 3 -  Full thickness tissue loss. Subcutaneous fat may be visible but bone, tendon or muscle are NOT exposed.  Wound Description (Comments):   Present on Admission:      DVT prophylaxis: lovenox Family Communication:none Status is: Inpatient  Remains inpatient appropriate because: severity of ilness, gross hematuria with pyuria and now with a new urinary tract infection on antibiotics.  Family will meet with palliative care        Code Status:     Code Status Orders  (From admission, onward)           Start     Ordered   01/29/21 1343  Do not attempt resuscitation (DNR)  Continuous       Question Answer Comment  In the event of cardiac or respiratory ARREST Do not call a "code blue"   In the  event of cardiac or respiratory ARREST Do not perform Intubation, CPR, defibrillation or ACLS   In the event of cardiac or respiratory ARREST Use medication by any route, position, wound care, and  other measures to relive pain and suffering. May use oxygen, suction and manual treatment of airway obstruction as needed for comfort.      01/29/21 1343           Code Status History     Date Active Date Inactive Code Status Order ID Comments User Context   01/13/2021 0144 01/13/2021 1157 DNR [id number]  Marily Memos, MD ED   01/02/2021 2231 01/10/2021 0331 DNR [id number]  Chotiner, Claudean Severance, MD ED   12/28/2020 1522 12/29/2020 2033 DNR [id number]  Ernie Avena, NP Inpatient   12/14/2020 1040 12/28/2020 1522 Full Code [id number]  Clydie Braun, MD ED   12/04/2020 1655 12/07/2020 2337 Full Code [id number]  Dede Query, MD Inpatient   12/04/2020 1623 12/04/2020 1655 Full Code [id number]  Dede Query, MD ED   02/24/2020 0451 02/27/2020 1731 DNR [id number]  Briscoe Deutscher, MD ED      Advance Directive Documentation    Flowsheet Row Most Recent Value  Type of Advance Directive Out of facility DNR (pink MOST or yellow form)  Pre-existing out of facility DNR order (yellow form or pink MOST form) --  "MOST" Form in Place? --         IV Access:   Peripheral IV   Procedures and diagnostic studies:   No results found.   Medical Consultants:   None.   Subjective:    Corey Cummings has a thick urine sediment, nonverbal this morning.  No further hematuria  Objective:    Vitals:   02/05/21 2017 02/06/21 0541 02/07/21 1228 02/08/21 0414  BP: 126/72 117/74 126/80 131/76  Pulse: 93 96 (!) 103 (!) 109  Resp: 18 16 18 19   Temp: 98.2 F (36.8 C) 97.9 F (36.6 C) 97.8 F (36.6 C) 97.6 F (36.4 C)  TempSrc: Oral Oral Axillary Oral  SpO2: 99% 96%  93%  Weight:      Height:       SpO2: 93 % O2 Flow Rate (L/min): 1 L/min   Intake/Output Summary (Last 24 hours) at 02/08/2021 1020 Last data filed at 02/08/2021 0710 Gross per 24 hour  Intake 210 ml  Output 1750 ml  Net -1540 ml   Filed Weights   01/29/21 0901  Weight: 56.7 kg    Exam: General exam: In no acute  distress. Respiratory system: Good air movement and clear to auscultation. Cardiovascular system: S1 & S2 heard, RRR. No JVD Gastrointestinal system: Abdomen is nondistended, soft and nontender.  Extremities: No pedal edema. Skin: No rashes, lesions or ulcers   Data Reviewed:    Labs: Basic Metabolic Panel: Recent Labs  Lab 02/02/21 0153 02/03/21 0245 02/05/21 0135 02/06/21 0428 02/08/21 0423  NA 136 137 136 134* 136  K 3.8 4.1 4.0 4.2 4.3  CL 105 106 106 104 105  CO2 25 26 24 25 23   GLUCOSE 145* 109* 147* 167* 106*  BUN 21 24* 32* 34* 38*  CREATININE 1.20 1.38* 1.17 1.22 1.38*  CALCIUM 8.1* 8.3* 8.4* 8.5* 8.8*   GFR Estimated Creatinine Clearance: 29.7 mL/min (A) (by C-G formula based on SCr of 1.38 mg/dL (H)). Liver Function Tests: No results for input(s): AST, ALT, ALKPHOS, BILITOT, PROT, ALBUMIN in the last 168 hours. No results for input(s): LIPASE, AMYLASE in the last 168 hours.  No results for input(s): AMMONIA in the last 168 hours. Coagulation profile No results for input(s): INR, PROTIME in the last 168 hours. COVID-19 Labs  No results for input(s): DDIMER, FERRITIN, LDH, CRP in the last 72 hours.  Lab Results  Component Value Date   SARSCOV2NAA NEGATIVE 01/29/2021   [place] NEGATIVE 01/12/2021   [place] NEGATIVE 01/09/2021   [place] NEGATIVE 01/03/2021    CBC: Recent Labs  Lab 02/03/21 0245 02/05/21 0135 02/06/21 0428 02/07/21 0315 02/08/21 0423  WBC 6.7 5.5 6.1 8.9 4.5  HGB 8.6* 8.0* 7.6* 8.9* 8.3*  HCT 27.7* 25.6* 24.1* 28.7* 26.4*  MCV 86.3 86.5 86.1 86.2 86.6  PLT 223 249 287 389 389   Cardiac Enzymes: No results for input(s): CKTOTAL, CKMB, CKMBINDEX, TROPONINI in the last 168 hours. BNP (last 3 results) No results for input(s): PROBNP in the last 8760 hours. CBG: No results for input(s): GLUCAP in the last 168 hours. D-Dimer: No results for input(s): DDIMER in the last 72 hours. Hgb A1c: No results for input(s):  HGBA1C in the last 72 hours. Lipid Profile: No results for input(s): CHOL, HDL, LDLCALC, TRIG, CHOLHDL, LDLDIRECT in the last 72 hours. Thyroid function studies: No results for input(s): TSH, T4TOTAL, T3FREE, THYROIDAB in the last 72 hours.  Invalid input(s): FREET3 Anemia work up: No results for input(s): VITAMINB12, FOLATE, FERRITIN, TIBC, IRON, RETICCTPCT in the last 72 hours. Sepsis Labs: Recent Labs  Lab 02/05/21 0135 02/06/21 0428 02/07/21 0315 02/08/21 0423  WBC 5.5 6.1 8.9 4.5   Microbiology Recent Results (from the past 240 hour(s))  Resp Panel by RT-PCR (Flu A&B, Covid) Nasopharyngeal Swab     Status: None   Collection Time: 01/29/21 11:01 AM   Specimen: Nasopharyngeal Swab; Nasopharyngeal(NP) swabs in vial transport medium  Result Value Ref Range Status   SARS Coronavirus 2 by RT PCR NEGATIVE NEGATIVE Final    Comment: (NOTE) SARS-CoV-2 target nucleic acids are NOT DETECTED.  The SARS-CoV-2 RNA is generally detectable in upper respiratory specimens during the acute phase of infection. The lowest concentration of SARS-CoV-2 viral copies this assay can detect is 138 copies/mL. A negative result does not preclude SARS-Cov-2 infection and should not be used as the sole basis for treatment or other patient management decisions. A negative result may occur with  improper specimen collection/handling, submission of specimen other than nasopharyngeal swab, presence of viral mutation(s) within the areas targeted by this assay, and inadequate number of viral copies(<138 copies/mL). A negative result must be combined with clinical observations, patient history, and epidemiological information. The expected result is Negative.  Fact Sheet for Patients:  EntrepreneurPulse.com.au  Fact Sheet for Healthcare Providers:  IncredibleEmployment.be  This test is no t yet approved or cleared by the [place] FDA and  has been authorized for  detection and/or diagnosis of SARS-CoV-2 by FDA under an Emergency Use Authorization (EUA). This EUA will remain  in effect (meaning this test can be used) for the duration of the COVID-19 declaration under Section 564(b)(1) of the Act, 21 U.S.C.section 360bbb-3(b)(1), unless the authorization is terminated  or revoked sooner.       Influenza A by PCR NEGATIVE NEGATIVE Final   Influenza B by PCR NEGATIVE NEGATIVE Final    Comment: (NOTE) The Xpert Xpress SARS-CoV-2/FLU/RSV plus assay is intended as an aid in the diagnosis of influenza from Nasopharyngeal swab specimens and should not be used as a sole basis for treatment. Nasal washings and aspirates are unacceptable for Xpert Xpress SARS-CoV-2/FLU/RSV testing.  Fact Sheet for Patients: EntrepreneurPulse.com.au  Fact Sheet for Healthcare Providers: IncredibleEmployment.be  This test is not yet approved or cleared by the [place] FDA and has been authorized for detection and/or diagnosis of SARS-CoV-2 by FDA under an Emergency Use Authorization (EUA). This EUA will remain in effect (meaning this test can be used) for the duration of the COVID-19 declaration under Section 564(b)(1) of the Act, 21 U.S.C. section 360bbb-3(b)(1), unless the authorization is terminated or revoked.  Performed at [facility], [place] [address], [place], [place] [address]   Urine Culture     Status: Abnormal   Collection Time: 01/29/21 11:45 AM   Specimen: Urine, Catheterized  Result Value Ref Range Status   Specimen Description URINE, CATHETERIZED  Final   Special Requests NONE  Final   Culture (A)  Final    <10,000 COLONIES/mL INSIGNIFICANT GROWTH Performed at [facility], [place] [address] [place], [place] [address]    Report Status 01/30/2021 FINAL  Final  Urine Culture     Status: Abnormal (Preliminary result)   Collection Time: 02/07/21  7:17 AM   Specimen: Urine, Catheterized  Result  Value Ref Range Status   Specimen Description URINE, CATHETERIZED  Final   Special Requests   Final    Normal Performed at [facility], [place] [address], [place], [place] [address]    Culture >=100,000 COLONIES/mL GRAM NEGATIVE RODS (A)  Final   Report Status PENDING  Incomplete     Medications:    apixaban  5 mg Oral BID   Chlorhexidine Gluconate Cloth  6 each Topical Daily   feeding supplement  237 mL Oral TID BM   finasteride  5 mg Oral QHS   mouth rinse  15 mL Mouth Rinse BID   multivitamin with minerals  1 tablet Oral Daily   tamsulosin  0.4 mg Oral Daily   thiamine  100 mg Oral Daily   Continuous Infusions:  cefTRIAXone (ROCEPHIN)  IV 1 g (02/08/21 0857)      LOS: 10 days   Charlynne Cousins  [facility]  02/08/2021, 10:20 AM

## 2021-02-08 NOTE — Consult Note (Signed)
Consultation Note Date: 02/08/2021   Patient Name: Corey Cummings  DOB: May 03, 1981  MRN: [id number]  Age / Sex: 87 y.o., male  PCP: [name], MD Referring Physician: David Stall, Darin Engels, MD  Reason for Consultation: Establishing goals of care  HPI/Patient Profile: 87 y.o. male  with past medical history of BPH with chronic indwelling Foley catheter, obstructive renal stone status post stenting, HTN, PE in November 2021 on Eliquis, and chronic iron deficiency anemia on iron supplement admitted on 01/29/2021 with AMS and fall. In ED, found to have gross hematuria, clogged foley catheter, and AKI. Had anemia with hgb 7.2 and required 1 unit PRBC. Also with stage III sacral decubitus ulcer present on admission. Eliquis stopped/restarted with recurrent hematuria. Required bladder irrigation. Now urine has remained clear and eliquis was restarted on 10/30 however he developed pyuria. Urine culture positive for gram negative rods. PMT consulted for GOC discussion per family request.  Clinical Assessment and Goals of Care: I have reviewed medical records including EPIC notes, labs and imaging, received report from RN, assessed the patient and then spoke with patient's step daughter Corey Cummings  to discuss diagnosis prognosis, GOC, EOL wishes, disposition and options.  RN reports patient has slept all day. Staff able to feed him a few bites of applesauce for breakfast but he needed extensive prompting and encouragement.   When speaking with Corey Cummings, I introduced Palliative Medicine as specialized medical care for people living with serious illness. It focuses on providing relief from the symptoms and stress of a serious illness. The goal is to improve quality of life for both the patient and the family.  Corey Cummings tells me she is patient's step daughter and is his HCPOA. Patient's only other living family is sister in laws.   As far as functional and nutritional  status, Corey Cummings tells me of drastic decline over the past 2 months. She tells me patient is mostly bedbound at his facility. She tells me of poor appetite in facility and much worse at hospital - tells me when visiting earlier this week he went multiple days without eating. We discuss that he did have a few bites of apple sauce this morning but only because of extensive prompting - would not have eaten without extensive prompting and encouragement. We discuss that he spends most of his time asleep - when he is awake he is quite confused. Debbie and patient's SIL also share he has lost 40 pounds in last 2 months.    We discussed patient's current illness and what it means in the larger context of patient's on-going co-morbidities.  Natural disease trajectory and expectations at EOL were discussed. We discuss his drastic decline in function, nutrition, and cognition. Discuss multiple hospitalizations and repeated infections.   I attempted to elicit values and goals of care important to the patient.  Corey Cummings shares her concerns about patient's quality of life and concern that he would not want his life prolonged in his current state.  The difference between aggressive medical intervention and comfort care was considered in light of the patient's goals of care. Extensively reviewed continuing current care vs shift to comfort measures - Corey Cummings would like to shift to comfort measures as she feels current interventions are not improving Corey Cummings's quality of life.  Debbie and patient's SIL share that they have been feeling like patient is nearing end of life d/t the drastic decline they have witnessed - they are not shocked or surprised when I share time could be quite short -  especially with dc of antibiotics and a shift to focus on comfort only.   Discussed philosophy of hospice care and type of care provided - Corey Cummings is interested in [facility] as this is very close to her home.   Questions and concerns were  addressed. The family was encouraged to call with questions or concerns.    Primary Decision Maker HCPOA- step daughter Corey Cummings  SUMMARY OF RECOMMENDATIONS   - comfort measures only - dc antibiotics - PRN meds to promote comfort - referral to hospice facility - family hopeful for [place] Planning: DNR  Additional Recommendations (Limitations, Scope, Preferences): Full Comfort Care  Prognosis:  < 2 weeks  Discharge Planning: Hospice facility      Primary Diagnoses: Present on Admission:  Acute metabolic encephalopathy  BPH (benign prostatic hyperplasia)  Essential hypertension   I have reviewed the medical record, interviewed the patient and family, and examined the patient. The following aspects are pertinent.  Past Medical History:  Diagnosis Date   BPH (benign prostatic hyperplasia)    Hypertension    Social History   Socioeconomic History   Marital status: Widowed    Spouse name: Not on file   Number of children: Not on file   Years of education: Not on file   Highest education level: Not on file  Occupational History   Not on file  Tobacco Use   Smoking status: Former    Types: Cigarettes   Smokeless tobacco: Never   Tobacco comments:    Quit 40 years ago  Substance and Sexual Activity   Alcohol use: Not Currently   Drug use: Not Currently   Sexual activity: Not Currently  Other Topics Concern   Not on file  Social History Narrative   Not on file   Social Determinants of Health   Financial Resource Strain: Not on file  Food Insecurity: Not on file  Transportation Needs: Not on file  Physical Activity: Not on file  Stress: Not on file  Social Connections: Not on file   History reviewed. No pertinent family history. Scheduled Meds:  apixaban  5 mg Oral BID   Chlorhexidine Gluconate Cloth  6 each Topical Daily   feeding supplement  237 mL Oral TID BM   finasteride  5 mg Oral QHS   mouth rinse  15 mL Mouth  Rinse BID   multivitamin with minerals  1 tablet Oral Daily   tamsulosin  0.4 mg Oral Daily   thiamine  100 mg Oral Daily   Continuous Infusions:  cefTRIAXone (ROCEPHIN)  IV 1 g (02/08/21 0857)   PRN Meds:.hydrALAZINE, ondansetron **OR** ondansetron (ZOFRAN) IV No Known Allergies Review of Systems  Unable to perform ROS: Mental status change   Physical Exam Constitutional:      General: He is not in acute distress.    Comments: Sleeping, does not wake to voice  Pulmonary:     Effort: Pulmonary effort is normal. No respiratory distress.  Skin:    General: Skin is warm and dry.    Vital Signs: BP 131/76   Pulse (!) 109   Temp 97.6 F (36.4 C) (Oral)   Resp 19   Ht 5\' 9"  (1.753 m)   Wt 56.7 kg   SpO2 93%   BMI 18.46 kg/m  Pain Scale: 0-10 POSS *See Group Information*: 1-Acceptable,Awake and alert Pain Score: 0-No pain   SpO2: SpO2: 93 % O2 Device:SpO2: 93 % O2 Flow Rate: .O2 Flow Rate (L/min): 1  L/min  IO: Intake/output summary:  Intake/Output Summary (Last 24 hours) at 02/08/2021 1152 Last data filed at 02/08/2021 0710 Gross per 24 hour  Intake 210 ml  Output 1750 ml  Net -1540 ml    LBM: Last BM Date: 02/07/21 Baseline Weight: Weight: 56.7 kg Most recent weight: Weight: 56.7 kg     Palliative Assessment/Data: PPS 20%    Time Total: 85 minutes Greater than 50%  of this time was spent counseling and coordinating care related to the above assessment and plan.  Juel Burrow, DNP, AGNP-C Palliative Medicine Team [phone] Pager: [phone]

## 2021-02-08 NOTE — Plan of Care (Signed)

## 2021-02-08 NOTE — TOC Progression Note (Signed)
Transition of Care [facility]) - Progression Note    Patient Details  Name: Corey Cummings MRN: [id number] Date of Birth: July 15, 1930  [facility] CM/SW Contact  Beckie Busing, RN Phone Number:[phone]  02/08/2021, 12:55 PM  Clinical Narrative:    CM received message from MD updating CM that family wants comfort care with residential hospice at [facility]. Referral has been called to  Elsie Saas with [facility]. TOC will continue to follow   Expected Discharge Plan: Skilled Nursing Facility Barriers to Discharge: Continued Medical Work up  Expected Discharge Plan and Services Expected Discharge Plan: Skilled Nursing Facility In-house Referral: NA Discharge Planning Services: CM Consult Post Acute Care Choice: Skilled Nursing Facility Living arrangements for the past 2 months: Skilled Nursing Facility                 DME Arranged: N/A DME Agency: NA       HH Arranged: NA HH Agency: NA         Social Determinants of Health (SDOH) Interventions    Readmission Risk Interventions Readmission Risk Prevention Plan 02/01/2021 01/05/2021  Transportation Screening Complete Complete  Medication Review Oceanographer) Complete Complete  PCP or Specialist appointment within 3-5 days of discharge Complete Complete  HRI or Home Care Consult Complete Complete  SW Recovery Care/Counseling Consult Complete Complete  Palliative Care Screening Not Applicable -  Skilled Nursing Facility Complete Complete  Some recent data might be hidden

## 2021-02-09 DIAGNOSIS — Z7189 Other specified counseling: Secondary | ICD-10-CM | POA: Diagnosis not present

## 2021-02-09 DIAGNOSIS — G9341 Metabolic encephalopathy: Secondary | ICD-10-CM | POA: Diagnosis not present

## 2021-02-09 DIAGNOSIS — Z978 Presence of other specified devices: Secondary | ICD-10-CM | POA: Diagnosis not present

## 2021-02-09 DIAGNOSIS — I10 Essential (primary) hypertension: Secondary | ICD-10-CM | POA: Diagnosis not present

## 2021-02-09 DIAGNOSIS — Z515 Encounter for palliative care: Secondary | ICD-10-CM | POA: Diagnosis not present

## 2021-02-09 DIAGNOSIS — N179 Acute kidney failure, unspecified: Secondary | ICD-10-CM | POA: Diagnosis not present

## 2021-02-09 LAB — URINE CULTURE
Culture: >=100000 — AB
Special Requests: NORMAL

## 2021-02-09 MED ORDER — GLYCOPYRROLATE 1 MG PO TABS: 1.0000 mg | ORAL_TABLET | ORAL | Status: AC | PRN

## 2021-02-09 MED ORDER — LORAZEPAM 1 MG PO TABS: 1.0000 mg | ORAL_TABLET | ORAL | 0 refills | Status: AC | PRN

## 2021-02-09 NOTE — Progress Notes (Signed)
Report called to RN Pam at [facility]. Informed that patient has sacral wound and chronic foley. Questions and concerns answered.

## 2021-02-09 NOTE — Progress Notes (Signed)
Daily Progress Note   Patient Name: Corey Cummings       Date: 02/09/2021 DOB: 1931/03/05  Age: 87 y.o. MRN#: [id number] Attending Physician: Marinda Elk, MD Primary Care Physician: [name], MD Admit Date: 01/29/2021  Reason for Consultation/Follow-up: Hospice Evaluation and Terminal Care  Subjective: Patient resting, appears comfortable, nonverbal - oxygen has been removed - not eating - required a dose of robinul 11/2 afternoon  Length of Stay: 11  Current Medications: Scheduled Meds:  . finasteride  5 mg Oral QHS  . mouth rinse  15 mL Mouth Rinse BID  . tamsulosin  0.4 mg Oral Daily    Continuous Infusions:   PRN Meds: acetaminophen **OR** acetaminophen, antiseptic oral rinse, feeding supplement, glycopyrrolate **OR** glycopyrrolate **OR** glycopyrrolate, haloperidol **OR** haloperidol **OR** haloperidol lactate, LORazepam **OR** LORazepam **OR** LORazepam, morphine injection, ondansetron **OR** ondansetron (ZOFRAN) IV, polyvinyl alcohol  Physical Exam Constitutional:      Comments: Wakes briefly to voice, back to sleep quickly - does not speak or follow commands, appears comfortable  Pulmonary:     Effort: Pulmonary effort is normal. No respiratory distress.  Musculoskeletal:     Right lower leg: No edema.     Left lower leg: No edema.  Skin:    General: Skin is warm and dry.  Neurological:     Mental Status: He is disoriented.            Vital Signs: BP (!) 110/53 (BP Location: Left Arm)   Pulse 82   Temp 97.8 F (36.6 C) (Axillary)   Resp 20   Ht 5\' 9"  (1.753 m)   Wt 56.7 kg   SpO2 (!) 80%   BMI 18.46 kg/m  SpO2: SpO2: (!) 80 % O2 Device: O2 Device: Room Air O2 Flow Rate: O2 Flow Rate (L/min): 3 L/min  Intake/output summary:  Intake/Output Summary (Last  24 hours) at 02/09/2021 0900 Last data filed at 02/09/2021 0617 Gross per 24 hour  Intake 0 ml  Output 550 ml  Net -550 ml   LBM: Last BM Date: 02/07/21 Baseline Weight: Weight: 56.7 kg Most recent weight: Weight: 56.7 kg       Palliative Assessment/Data: PPS 10%      Patient Active Problem List   Diagnosis Date Noted  . AKI (acute kidney injury) (HCC) 01/29/2021  . Fall   . Hydronephrosis of right kidney 01/02/2021  . Complicated UTI (urinary tract infection) 01/02/2021  . Hypernatremia 01/02/2021  . Essential hypertension 01/02/2021  . Protein-calorie malnutrition, severe 12/29/2020  . Pressure injury of skin 12/24/2020  . Acute metabolic encephalopathy 12/14/2020  . Hypokalemia 12/14/2020  . History of DVT (deep vein thrombosis) 12/14/2020  . History of pulmonary embolus (PE) 12/14/2020  . Severe sepsis (HCC) 12/04/2020  . Lactic acidosis 12/04/2020  . Acute cystitis without hematuria 12/04/2020  . Foley catheter problem (HCC) 12/04/2020  . Unilateral recurrent inguinal hernia without obstruction or gangrene 12/04/2020  . Acute pyelonephritis 12/04/2020  . BPH (benign prostatic hyperplasia) 12/04/2020  . Chronic indwelling Foley catheter 12/04/2020  . Hypertensive urgency 12/04/2020  . CKD (chronic kidney disease), stage III (HCC) 12/04/2020  . Acute pulmonary embolism (HCC) 02/24/2020  . Ureteral obstruction, right 02/24/2020  .  Urinary tract infection associated with catheterization of urinary tract, initial encounter ([place]) 02/24/2020  . Renal insufficiency 02/24/2020  . Thoracic aortic aneurysm without rupture 02/24/2020  . Bronchiectasis ([place]) 02/24/2020  . Pulmonary embolism ([place]) 02/24/2020  . Hydronephrosis with urinary obstruction due to ureteral calculus     Palliative Care Assessment & Plan   HPI: 87 y.o. male  with past medical history of BPH with chronic indwelling Foley catheter, obstructive renal stone status post stenting, HTN, PE in November  2021 on Eliquis, and chronic iron deficiency anemia on iron supplement admitted on 01/29/2021 with AMS and fall. In ED, found to have gross hematuria, clogged foley catheter, and AKI. Had anemia with hgb 7.2 and required 1 unit PRBC. Also with stage III sacral decubitus ulcer present on admission. Eliquis stopped/restarted with recurrent hematuria. Required bladder irrigation. Now urine has remained clear and eliquis was restarted on 10/30 however he developed pyuria. Urine culture positive for gram negative rods. PMT consulted for [place] discussion per family request.  Assessment: Continues on comfort measures only - appears comfortable - medications reviewed. Spoke with RN - patient comfortable for her, no questions or concerns. Updated daughter [name] - she is anxious to hear from hospice and hopeful patient can be moved to hospice facility soon. [name] remains interested in comfort measures only.   Recommendations/Plan: Continue with comfort measures only PRN meds to promote comfort Family hopeful for [facility] when bed available  Goals of Care and Additional Recommendations: Limitations on Scope of Treatment: Full Comfort Care  Code Status: DNR  Prognosis:  < 2 weeks  Discharge Planning: Hospice facility  Care plan was discussed with RN and patient's step daughter [name]  Thank you for allowing the Palliative Medicine Team to assist in the care of this patient.   Total Time 20 minutes Prolonged Time Billed  no       Greater than 50%  of this time was spent counseling and coordinating care related to the above assessment and plan.  Juel Burrow, DNP, [facility] Palliative Medicine Team Team Phone # [phone]  Pager [phone]

## 2021-02-09 NOTE — Progress Notes (Signed)
Spoke with pt stepdaughter concerning palliative. Told her that their services can be very beneficial to families and patients. She is worried about her step dad and would like to talk to someone. She is also concerned about his finances.

## 2021-02-09 NOTE — Progress Notes (Signed)
[facility] PROGRESS NOTE    Progress Note  Corey Cummings  ZWC:[id number] DOB: [birth year] DOA: 01/29/2021 PCP: [name], MD     Brief Narrative:   Domenique Southers is an 87 y.o. male past medical history of BPH with a chronic indwelling Foley catheter, obstructive nephrolithiasis status post stenting with a history of PE on November 2021 on Eliquis, chronic iron deficiency anemia sent to the ED from skilled nursing facility due to worsening mental status and fall.  In the ED he was found to have gross hematuria with urinary retention Foley catheter was clogged which probably led to his acute kidney injury along with encephalopathy.  Urology was consulted recommended to hold Eliquis perform bladder irrigation, hematuria fairly clear up however now there is thick white sediment in the catheter and tubing    Assessment/Plan:   Gross hematuria/chronic indwelling Foley catheter/BPH: Previous physician spoke with daughter and she would like to meet with palliative care due to ongoing decline and poor functional status. After meeting with palliative care family decided to move towards comfort care they would prefer  Pyuria: Antibiotics have been discontinued as the family decided to move towards comfort care To avoid all medications and not related to comfort.  Acute kidney injury injury:  Antibiotics have been discontinued as the family decided to move towards comfort care To avoid all medications and not related to comfort.  Acute blood loss anemia: History of PE: Acute metabolic encephalopathy/acute confusional state: Hypomagnesemia: Hyperkalemia: Essential hypertension: Stage III sacral decubitus ulcer present on admission RN Pressure Injury Documentation: Pressure Injury 12/23/20 Coccyx Medial Stage 3 -  Full thickness tissue loss. Subcutaneous fat may be visible but bone, tendon or muscle are NOT exposed. (Active)  12/23/20 2020  Location: Coccyx  Location Orientation:  Medial  Staging: Stage 3 -  Full thickness tissue loss. Subcutaneous fat may be visible but bone, tendon or muscle are NOT exposed.  Wound Description (Comments):   Present on Admission:      DVT prophylaxis: lovenox Family Communication:none Status is: Inpatient  Remains inpatient appropriate because: Family decided to move towards comfort care awaiting residential hospice facility placement.        Code Status:     Code Status Orders  (From admission, onward)           Start     Ordered   01/29/21 1343  Do not attempt resuscitation (DNR)  Continuous       Question Answer Comment  In the event of cardiac or respiratory ARREST Do not call a "code blue"   In the event of cardiac or respiratory ARREST Do not perform Intubation, CPR, defibrillation or ACLS   In the event of cardiac or respiratory ARREST Use medication by any route, position, wound care, and other measures to relive pain and suffering. May use oxygen, suction and manual treatment of airway obstruction as needed for comfort.      01/29/21 1343           Code Status History     Date Active Date Inactive Code Status Order ID Comments User Context   01/13/2021 0144 01/13/2021 1157 DNR [id number]  Marily Memos, MD ED   01/02/2021 2231 01/10/2021 0331 DNR [id number]  Chotiner, Claudean Severance, MD ED   12/28/2020 1522 12/29/2020 2033 DNR [id number]  Ernie Avena, NP Inpatient   12/14/2020 1040 12/28/2020 1522 Full Code [id number]  Clydie Braun, MD ED   12/04/2020 1655 12/07/2020 2337 Full Code [id number]  Dierdre Searles, Na,  MD Inpatient   12/04/2020 1623 12/04/2020 1655 Full Code [id number]  Charlann Lange, MD ED   02/24/2020 0451 02/27/2020 1731 DNR [id number]  Vianne Bulls, MD ED      Advance Directive Documentation    [place] Most Recent Value  Type of Advance Directive Out of facility DNR (pink MOST or yellow form)  Pre-existing out of facility DNR order (yellow form or pink MOST form) --  "MOST" Form in Place? --          IV Access:   Peripheral IV   Procedures and diagnostic studies:   No results found.   Medical Consultants:   None.   Subjective:    Valeda Malm nonverbal this morning.  Objective:    Vitals:   02/08/21 1229 02/08/21 1951 02/09/21 0412 02/09/21 0800  BP: (!) 130/98 117/82 126/77 (!) 110/53  Pulse: 84 (!) 107 80 82  Resp:   18 20  Temp: (!) 96.8 F (36 C) 97.8 F (36.6 C) 98.1 F (36.7 C) 97.8 F (36.6 C)  TempSrc:  Axillary Axillary Axillary  SpO2: 100% (!) 80% 100% (!) 80%  Weight:      Height:       SpO2: (!) 80 % O2 Flow Rate (L/min): 3 L/min   Intake/Output Summary (Last 24 hours) at 02/09/2021 1027 Last data filed at 02/09/2021 0617 Gross per 24 hour  Intake 0 ml  Output 550 ml  Net -550 ml    Filed Weights   01/29/21 0901  Weight: 56.7 kg    Exam: General exam: In no acute distress, cachectic. Respiratory system: Good air movement and clear to auscultation. Cardiovascular system: S1 & S2 heard, RRR. No JVD. Gastrointestinal system: Abdomen is nondistended, soft and nontender.  Extremities: No pedal edema. Skin: No rashes, lesions or ulcers   Data Reviewed:    Labs: Basic Metabolic Panel: Recent Labs  Lab 02/03/21 0245 02/05/21 0135 02/06/21 0428 02/08/21 0423  NA 137 136 134* 136  K 4.1 4.0 4.2 4.3  CL 106 106 104 105  CO2 26 24 25 23   GLUCOSE 109* 147* 167* 106*  BUN 24* 32* 34* 38*  CREATININE 1.38* 1.17 1.22 1.38*  CALCIUM 8.3* 8.4* 8.5* 8.8*    GFR Estimated Creatinine Clearance: 29.7 mL/min (A) (by C-G formula based on SCr of 1.38 mg/dL (H)). Liver Function Tests: No results for input(s): AST, ALT, ALKPHOS, BILITOT, PROT, ALBUMIN in the last 168 hours. No results for input(s): LIPASE, AMYLASE in the last 168 hours. No results for input(s): AMMONIA in the last 168 hours. Coagulation profile No results for input(s): INR, PROTIME in the last 168 hours. COVID-19 Labs  No results for input(s): DDIMER,  FERRITIN, LDH, CRP in the last 72 hours.  Lab Results  Component Value Date   SARSCOV2NAA NEGATIVE 01/29/2021   [place] NEGATIVE 01/12/2021   [place] NEGATIVE 01/09/2021   [place] NEGATIVE 01/03/2021    CBC: Recent Labs  Lab 02/03/21 0245 02/05/21 0135 02/06/21 0428 02/07/21 0315 02/08/21 0423  WBC 6.7 5.5 6.1 8.9 4.5  HGB 8.6* 8.0* 7.6* 8.9* 8.3*  HCT 27.7* 25.6* 24.1* 28.7* 26.4*  MCV 86.3 86.5 86.1 86.2 86.6  PLT 223 249 287 389 389    Cardiac Enzymes: No results for input(s): CKTOTAL, CKMB, CKMBINDEX, TROPONINI in the last 168 hours. BNP (last 3 results) No results for input(s): PROBNP in the last 8760 hours. CBG: No results for input(s): GLUCAP in the last 168 hours. D-Dimer: No results for input(s): DDIMER  in the last 72 hours. Hgb A1c: No results for input(s): HGBA1C in the last 72 hours. Lipid Profile: No results for input(s): CHOL, HDL, LDLCALC, TRIG, CHOLHDL, LDLDIRECT in the last 72 hours. Thyroid function studies: No results for input(s): TSH, T4TOTAL, T3FREE, THYROIDAB in the last 72 hours.  Invalid input(s): FREET3 Anemia work up: No results for input(s): VITAMINB12, FOLATE, FERRITIN, TIBC, IRON, RETICCTPCT in the last 72 hours. Sepsis Labs: Recent Labs  Lab 02/05/21 0135 02/06/21 0428 02/07/21 0315 02/08/21 0423  WBC 5.5 6.1 8.9 4.5    Microbiology Recent Results (from the past 240 hour(s))  Urine Culture     Status: Abnormal   Collection Time: 02/07/21  7:17 AM   Specimen: Urine, Catheterized  Result Value Ref Range Status   Specimen Description URINE, CATHETERIZED  Final   Special Requests   Final    Normal Performed at [facility], [address], [place], [place] [address]    Culture (A)  Final    >=100,000 COLONIES/mL KLEBSIELLA PNEUMONIAE Confirmed Extended Spectrum Beta-Lactamase Producer (ESBL).  In bloodstream infections from ESBL organisms, carbapenems are preferred over piperacillin/tazobactam. They are  shown to have a lower risk of mortality.    Report Status 02/09/2021 FINAL  Final   Organism ID, Bacteria KLEBSIELLA PNEUMONIAE (A)  Final      Susceptibility   Klebsiella pneumoniae - MIC*    AMPICILLIN >=32 RESISTANT Resistant     CEFAZOLIN >=64 RESISTANT Resistant     CEFEPIME >=32 RESISTANT Resistant     CEFTRIAXONE >=64 RESISTANT Resistant     CIPROFLOXACIN 2 RESISTANT Resistant     GENTAMICIN >=16 RESISTANT Resistant     IMIPENEM <=0.25 SENSITIVE Sensitive     NITROFURANTOIN 64 INTERMEDIATE Intermediate     TRIMETH/SULFA >=320 RESISTANT Resistant     AMPICILLIN/SULBACTAM >=32 RESISTANT Resistant     PIP/TAZO 16 SENSITIVE Sensitive     * >=100,000 COLONIES/mL KLEBSIELLA PNEUMONIAE     Medications:    finasteride  5 mg Oral QHS   mouth rinse  15 mL Mouth Rinse BID   tamsulosin  0.4 mg Oral Daily   Continuous Infusions:      LOS: 11 days   Charlynne Cousins  [facility]  02/09/2021, 10:27 AM

## 2021-02-09 NOTE — Progress Notes (Signed)
Civil engineer, contracting [facility]) Hospital Liaison note.    [facility] had a bed become available.  Spoke with family to confirm interest and explain services. Family agreeable to transfer today. TOC aware.    [facility] will notify TOC when registration paperwork has been completed to arrange transport.   RN please call report to [phone]. Please be sure a signed DNR form transport with the patient.  Thank you for the opportunity to participate in this patient's care.  Gillian Scarce, BSN, RN [facility] Liaison (listed on [place] under Hospice/[facility])    [phone] (24h on call)

## 2021-02-09 NOTE — Progress Notes (Signed)
[facility] called for transportation at this time.

## 2021-02-09 NOTE — Progress Notes (Addendum)
Spoke with pt step daughter who is very happy about the decision that she made to go with palliative. She is very thankful of our previous conversation and wanted to thank myself and staff about being so nice to her and the pt. She is worried about coming to see him and states that she cant walk that good.

## 2021-02-09 NOTE — Progress Notes (Signed)
Civil engineer, contracting [facility]) Hospital Liaison note.    Chart and pt information has been reviewed by [facility] physician.  Hospice eligibility confirmed.  Spoke with pt's step daughter by phone to review process for transfer to [facility] once bed is available and to discuss questions about financial concerns and cost.  All questions answered at this time.  [facility] is unable to offer a room today. Hospital Liaison will follow up tomorrow or sooner if a room becomes available. Please do not hesitate to call with questions.    Thank you for the opportunity to participate in this patient's care.  Gillian Scarce, BSN, RN [facility] Liaison (listed on [place] under Hospice[facility])    [phone] (24h on call)

## 2021-02-09 NOTE — Discharge Summary (Signed)
Physician Discharge Summary  Corey Cummings VOZ:[id number] DOB: 02/26/1931 DOA: 01/29/2021  PCP: Jonathon Jordan, MD  Admit date: 01/29/2021 Discharge date: 02/09/2021  Admitted From: Home Disposition: Residential hospice facility   Recommendations for Outpatient Follow-up:  none  Home Health:no Equipment/Devices:none  Discharge Condition:Hospice CODE STATUS:DNR Diet recommendation: Comfort feeding  Brief/Interim Summary: 87 y.o. male past medical history of BPH with a chronic indwelling Foley catheter, obstructive nephrolithiasis status post stenting with a history of PE on November 2021 on Eliquis, chronic iron deficiency anemia sent to the ED from skilled nursing facility due to worsening mental status and fall.  In the ED he was found to have gross hematuria with urinary retention Foley catheter was clogged which probably led to his acute kidney injury along with encephalopathy.  Urology was consulted recommended to hold Eliquis perform bladder irrigation, hematuria fairly clear up however now there is thick white sediment in the catheter and tubing  Discharge Diagnoses:  Active Problems:   BPH (benign prostatic hyperplasia)   Chronic indwelling Foley catheter   Acute metabolic encephalopathy   Essential hypertension   AKI (acute kidney injury) ([place])  Gross hematuria/chronic indwelling Foley catheter/BPH: Multifactorial in the setting of a large prostate anticoagulation and catheter trauma. Urology was consulted perform CBI imaging showed no hydronephrosis. Urology recommended to continue Flomax and Proscar and they recommended against Eliquis due to his high risk of bleeding. Urine culture grew 10,000 colonies for which he completed 7-day course of antibiotics. Hematuria was cleared he will be started on Eliquis but unfortunately replaced again so internal medicine and urology recommended no further anticoagulation. Palliative care met with family and due to his deterioration  over the last several months and poor functional status decided to move towards comfort care he will be transition to residential hospice facility. All of his medications not related to comfort were stopped only medications to try to keep the patient comfortable for requested by the family.  Pyuria: He was started empirically on Rocephin urine culture grew gram-negative rods.  Acute kidney injury: Likely prerenal azotemia and probably post renal in the setting of obstructive uropathy and poor oral intake started on IV fluids and his creatinine returned to baseline.  Acute blood loss anemia: He was transfused 1 unit of packed red blood cells.  History of PE: His Eliquis was discontinued.  Acute metabolic encephalopathy/acute confusional state: Likely due to acute kidney injury now resolved.  Hypomagnesemia: Repleted now resolved.  Hyperkalemia: Likely due to acute kidney injury resolved with IV fluid and Lokelma.  Essential hypertension: Currently on no antihypertensive medication.  Discharge Instructions  Discharge Instructions     Diet - low sodium heart healthy   Complete by: As directed    Increase activity slowly   Complete by: As directed    No wound care   Complete by: As directed       Allergies as of 02/09/2021   No Known Allergies      Medication List     STOP taking these medications    acidophilus Caps capsule   amLODipine 5 MG tablet Commonly known as: NORVASC   amoxicillin-clavulanate 875-125 MG tablet Commonly known as: Augmentin   apixaban 5 MG Tabs tablet Commonly known as: ELIQUIS   ascorbic acid 250 MG tablet Commonly known as: VITAMIN C   feeding supplement Liqd   finasteride 5 MG tablet Commonly known as: PROSCAR   Fusion Plus Caps   multivitamin with minerals Tabs tablet   OLANZapine 5 MG tablet Commonly known  as: ZYPREXA   tamsulosin 0.4 MG Caps capsule Commonly known as: FLOMAX   thiamine 100 MG tablet   zinc  sulfate 220 (50 Zn) MG capsule       TAKE these medications    glycopyrrolate 1 MG tablet Commonly known as: ROBINUL Take 1 tablet (1 mg total) by mouth every 4 (four) hours as needed (excessive secretions).   LORazepam 1 MG tablet Commonly known as: ATIVAN Take 1 tablet (1 mg total) by mouth every 4 (four) hours as needed for anxiety.        No Known Allergies  Consultations: Urology Palliative care.   Procedures/Studies: DG Elbow Complete Left  Result Date: 01/29/2021 CLINICAL DATA:  87 year old male status post fall on Eliquis. EXAM: LEFT ELBOW - COMPLETE 3+ VIEW COMPARISON:  None. FINDINGS: Incidental antecubital fossa IV artifact. Chronic joint space loss, degenerative spurring and chronic Medial condyle fragmentation about the elbow. No joint effusion, acute fracture or dislocation identified. IMPRESSION: Degenerative changes.  No acute fracture or dislocation identified. Electronically Signed   By: Genevie Ann M.D.   On: 01/29/2021 11:19   CT Head Wo Contrast  Result Date: 01/29/2021 CLINICAL DATA:  87 year old male status post fall. EXAM: CT HEAD WITHOUT CONTRAST TECHNIQUE: Contiguous axial images were obtained from the base of the skull through the vertex without intravenous contrast. COMPARISON:  Brain MRI 01/05/2021.  Recent head CT 01/12/2021. FINDINGS: Brain: Motion artifact at the skull base. Stable cerebral volume. Stable gray-white matter differentiation throughout the brain. No midline shift, ventriculomegaly, mass effect, evidence of mass lesion, intracranial hemorrhage or evidence of cortically based acute infarction. Vascular: Calcified atherosclerosis at the skull base. Skull: Motion artifact at the level of the anterior cranial fossa. No acute osseous abnormality identified. Cervical spine is detailed separately. Sinuses/Orbits: Visualized paranasal sinuses and mastoids are clear. Other: No orbit or scalp soft tissue injury identified. IMPRESSION: 1. Mildly  degraded by motion. No acute traumatic injury identified. Stable non contrast CT appearance of the brain. 2. Cervical Spine CT is detailed separately. Electronically Signed   By: Genevie Ann M.D.   On: 01/29/2021 09:20   CT HEAD WO CONTRAST (5MM)  Result Date: 01/12/2021 CLINICAL DATA:  Head trauma. EXAM: CT HEAD WITHOUT CONTRAST TECHNIQUE: Contiguous axial images were obtained from the base of the skull through the vertex without intravenous contrast. COMPARISON:  CT head 12/14/2020. FINDINGS: Brain: No evidence of acute infarction, hemorrhage, hydrocephalus, extra-axial collection or mass lesion/mass effect. There is stable mild diffuse atrophy. There is stable mild periventricular and deep white matter hypodensity, likely chronic small vessel ischemic change. There is an old lacunar infarct in the left basal ganglia, unchanged. Vascular: Atherosclerotic calcifications are present within the cavernous internal carotid arteries. Skull: Normal. Negative for fracture or focal lesion. Sinuses/Orbits: No acute finding. Other: None. IMPRESSION: 1. No acute intracranial process. 2. Stable mild diffuse atrophy and mild chronic small vessel ischemic change. Electronically Signed   By: Ronney Asters M.D.   On: 01/12/2021 23:50   CT Chest Wo Contrast  Result Date: 01/13/2021 CLINICAL DATA:  Cough. EXAM: CT CHEST WITHOUT CONTRAST TECHNIQUE: Multidetector CT imaging of the chest was performed following the standard protocol without IV contrast. COMPARISON:  February 24, 2020 FINDINGS: Cardiovascular: There is mild to moderate severity calcification of the aortic arch without evidence of aortic aneurysm. Stable dilatation of the superior vena cava is seen. Normal heart size with mild to moderate severity coronary artery calcification. No pericardial effusion. Mediastinum/Nodes: No enlarged mediastinal or axillary  lymph nodes. The thyroid gland with multiple bilateral thyroid nodules noted. This is seen on the prior study.  The trachea and esophagus demonstrate no significant findings. Lungs/Pleura: Mild atelectasis is seen within the posterior aspect of the bilateral lung bases and periphery of the right upper lobe. Stable right upper lobe bronchiectasis is seen. There is a small right pleural effusion. No pneumothorax is identified. Upper Abdomen: There is a small hiatal hernia. Diffuse renal cortical thinning is seen on the right with disruption of the parenchyma of the right kidney. A large renal cyst is seen within this portion of the kidney on the prior study. Multiple parenchymal calcifications are seen within the visualized portion of the mid and upper right kidney. Musculoskeletal: Degenerative changes seen throughout the thoracic spine. A compression fracture deformity is seen at the L1 vertebral body. This is of indeterminate age. This area is not imaged on the prior exam. IMPRESSION: 1. Mild bilateral lower lobe and right upper lobe atelectasis. 2. Small right pleural effusion. 3. Compression fracture deformity of the L1 vertebral body of indeterminate age. 4. Enlarged thyroid gland containing multiple bilateral thyroid nodules. Further evaluation with thyroid ultrasound is recommended. This follows ACR consensus guidelines: Managing Incidental Thyroid Nodules Detected on Imaging: White Paper of the ACR Incidental Thyroid Findings Committee. J Am Coll Radiol 2015; 12:143-150. Aortic Atherosclerosis (ICD10-I70.0). Electronically Signed   By: Virgina Norfolk M.D.   On: 01/13/2021 00:04   CT Cervical Spine Wo Contrast  Result Date: 01/29/2021 CLINICAL DATA:  87 year old male status post fall. EXAM: CT CERVICAL SPINE WITHOUT CONTRAST TECHNIQUE: Multidetector CT imaging of the cervical spine was performed without intravenous contrast. Multiplanar CT image reconstructions were also generated. COMPARISON:  12/14/2020. FINDINGS: Alignment: Chronic straightening of cervical lordosis. Mild reversal today. But subtle  anterolisthesis of C2 on C3 and C7 on T1 appears stable. Stable posterior element alignment. Skull base and vertebrae: Visualized skull base is intact. No atlanto-occipital dissociation. C1 and C2 appear intact and aligned. No acute osseous abnormality identified. Soft tissues and spinal canal: No prevertebral fluid or swelling. No visible canal hematoma. Stable visible noncontrast neck soft tissues. Disc levels: Chronically advanced cervical spine degeneration appears stable since September. Advanced chronic facet arthropathy on the right at C2-C3. Chronic degenerative spinal stenosis at C2-C3 and C3-C4.Evidence of developing facet ankylosis on the left at C7-T1. Upper chest: Visible upper thoracic levels appear intact. Negative visible lung apices. IMPRESSION: 1. No acute traumatic injury identified in the cervical spine. 2. Chronically advanced cervical spine degeneration appears stable since September including chronic spinal stenosis at both C2-C3 and C3-C4. Electronically Signed   By: Genevie Ann M.D.   On: 01/29/2021 09:25   DG Chest Portable 1 View  Result Date: 01/29/2021 CLINICAL DATA:  fall on thinners EXAM: PORTABLE CHEST 1 VIEW COMPARISON:  January 08, 2021 FINDINGS: The cardiomediastinal silhouette is unchanged in contour.Tortuous thoracic aorta. Atherosclerotic calcifications. No pleural effusion. No pneumothorax. No acute pleuroparenchymal abnormality. Visualized abdomen is unremarkable. Known L1 compression fracture is not well visualized with AP technique. IMPRESSION: No acute cardiopulmonary abnormality. Electronically Signed   By: Valentino Saxon M.D.   On: 01/29/2021 09:49   DG Swallowing Func-Speech Pathology  Result Date: 02/01/2021 Table formatting from the original result was not included. Objective Swallowing Evaluation: Type of Study: MBS-Modified Barium Swallow Study  Patient Details Name: Corey Cummings MRN: [id number] Date of Birth: July 18, 1930 Today's Date: 02/01/2021 Time: SLP Start  Time (ACUTE ONLY): 7564 -SLP Stop Time (ACUTE ONLY): 3329 SLP Time  Calculation (min) (ACUTE ONLY): 15 min Past Medical History: Past Medical History: Diagnosis Date  BPH (benign prostatic hyperplasia)   Hypertension  Past Surgical History: Past Surgical History: Procedure Laterality Date  APPENDECTOMY    CYSTOSCOPY W/ URETERAL STENT PLACEMENT Right 12/04/2020  Procedure: CYSTOSCOPY WITH RETROGRADE PYELOGRAM/URETERAL STENT PLACEMENT;  Surgeon: Janith Lima, MD;  Location: [facility];  Service: Urology;  Laterality: Right;  CYSTOSCOPY W/ URETERAL STENT PLACEMENT Right 01/03/2021  Procedure: CYSTOSCOPY LITHOTRIPSY WITH RETROGRADE PYELOGRAM/ STENT REMOVAL;  Surgeon: Robley Fries, MD;  Location: [facility];  Service: Urology;  Laterality: Right; HPI: 87 y.o. male with medical history significant of BPH with chronic indwelling Foley catheter, obstructive renal stone status post stenting, HTN, PE November 21 on Eliquis, chronic iron deficiency anemia on iron supplement, was sent from nursing home for altered mentation and fall.  Patient was noted to be confused in ED, his work-up was significant for Meta urea, with urinary retention with clogged Foley catheter, AKI with a creatinine of 2.4, hematuria found to have Acute metabolic encephalopathy.  Subjective: alert, pleasant eager for PO Assessment / Plan / Recommendation CHL IP CLINICAL IMPRESSIONS 02/01/2021 Clinical Impression Pt presents with oropharyngeal dysphagia characterized by impaired bolus cohesion, a pharyngeal delay, and reduced anterior laryngeal movement. He demonstrated premature spillage to the pyriform sinuses and intermittent incomplete laryngeal closure/reduced duration of closure. Penetration (PAS 5) was noted with consecutive swallows of thin liquids and nectar thick liquids, and inconsistently with individual boluses of thin liquids via straw. Silent aspiration (PAS 8) was subsequently noted after penetration of thin and nectar thick liquids. Prompted  coughing did mobilize penetrated material and propel it slightly superiorly, but it was ineffective in expelling the material from the larynx.  The amount of penetrated and aspirated material was notably more significant with nectar thick liquids than with thin liquids, and nectar thick liquids were more resistant to mobilization with prompted coughing. A chin tuck posture was attempted, but pt demonstrated difficulty completing this accurately. Penetration and aspiration were eliminated with use of individual swallows via cup. It's is recommended that the pt's current diet of dysphagia 2 solids with thin liquids (via cup only) be continued with observance of swallowing precautions. SLP Visit Diagnosis Dysphagia, oropharyngeal phase (R13.12) Attention and concentration deficit following -- Frontal lobe and executive function deficit following -- Impact on safety and function Mild aspiration risk   CHL IP TREATMENT RECOMMENDATION 02/01/2021 Treatment Recommendations Therapy as outlined in treatment plan below   Prognosis 02/01/2021 Prognosis for Safe Diet Advancement Good Barriers to Reach Goals Cognitive deficits Barriers/Prognosis Comment -- CHL IP DIET RECOMMENDATION 02/01/2021 SLP Diet Recommendations Dysphagia 2 (Fine chop) solids;Thin liquid Liquid Administration via Cup;No straw Medication Administration Whole meds with puree Compensations Slow rate;Small sips/bites Postural Changes Seated upright at 90 degrees   CHL IP OTHER RECOMMENDATIONS 02/01/2021 Recommended Consults -- Oral Care Recommendations Oral care BID Other Recommendations --   CHL IP FOLLOW UP RECOMMENDATIONS 02/01/2021 Follow up Recommendations Skilled Nursing facility   [facility] IP FREQUENCY AND DURATION 02/01/2021 Speech Therapy Frequency (ACUTE ONLY) min 2x/week Treatment Duration 2 weeks      CHL IP ORAL PHASE 02/01/2021 Oral Phase Impaired Oral - Pudding Teaspoon -- Oral - Pudding Cup -- Oral - Honey Teaspoon -- Oral - Honey Cup Decreased bolus  cohesion;Premature spillage Oral - Nectar Teaspoon -- Oral - Nectar Cup Decreased bolus cohesion;Premature spillage Oral - Nectar Straw Decreased bolus cohesion;Premature spillage Oral - Thin Teaspoon -- Oral - Thin Cup Decreased bolus  cohesion;Premature spillage Oral - Thin Straw Decreased bolus cohesion;Premature spillage Oral - Puree Decreased bolus cohesion;Premature spillage Oral - Mech Soft Impaired mastication Oral - Regular -- Oral - Multi-Consistency -- Oral - Pill -- Oral Phase - Comment --  CHL IP PHARYNGEAL PHASE 02/01/2021 Pharyngeal Phase Impaired Pharyngeal- Pudding Teaspoon -- Pharyngeal -- Pharyngeal- Pudding Cup -- Pharyngeal -- Pharyngeal- Honey Teaspoon -- Pharyngeal -- Pharyngeal- Honey Cup Reduced anterior laryngeal mobility;Reduced epiglottic inversion;Delayed swallow initiation-pyriform sinuses Pharyngeal -- Pharyngeal- Nectar Teaspoon -- Pharyngeal -- Pharyngeal- Nectar Cup Reduced anterior laryngeal mobility;Reduced epiglottic inversion;Delayed swallow initiation-pyriform sinuses Pharyngeal -- Pharyngeal- Nectar Straw Reduced anterior laryngeal mobility;Reduced epiglottic inversion;Delayed swallow initiation-pyriform sinuses;Penetration/Aspiration during swallow;Penetration/Apiration after swallow Pharyngeal Material enters airway, CONTACTS cords and not ejected out;Material enters airway, passes BELOW cords without attempt by patient to eject out (silent aspiration) Pharyngeal- Thin Teaspoon -- Pharyngeal -- Pharyngeal- Thin Cup Reduced anterior laryngeal mobility;Reduced epiglottic inversion;Delayed swallow initiation-pyriform sinuses Pharyngeal Material does not enter airway Pharyngeal- Thin Straw Reduced anterior laryngeal mobility;Reduced epiglottic inversion;Delayed swallow initiation-pyriform sinuses;Penetration/Aspiration during swallow;Penetration/Apiration after swallow;Trace aspiration Pharyngeal Material enters airway, passes BELOW cords without attempt by patient to eject out  (silent aspiration);Material enters airway, CONTACTS cords and not ejected out Pharyngeal- Puree Reduced anterior laryngeal mobility;Reduced epiglottic inversion;Delayed swallow initiation-pyriform sinuses Pharyngeal -- Pharyngeal- Mechanical Soft Reduced anterior laryngeal mobility;Reduced epiglottic inversion;Delayed swallow initiation-pyriform sinuses Pharyngeal -- Pharyngeal- Regular -- Pharyngeal -- Pharyngeal- Multi-consistency -- Pharyngeal -- Pharyngeal- Pill -- Pharyngeal -- Pharyngeal Comment --  CHL IP CERVICAL ESOPHAGEAL PHASE 02/01/2021 Cervical Esophageal Phase WFL Pudding Teaspoon -- Pudding Cup -- Honey Teaspoon -- Honey Cup -- Nectar Teaspoon -- Nectar Cup -- Nectar Straw -- Thin Teaspoon -- Thin Cup -- Thin Straw -- Puree -- Mechanical Soft -- Regular -- Multi-consistency -- Pill -- Cervical Esophageal Comment -- Shanika I. Hardin Negus, [place], [place] Office number [phone] Pager [place] 02/01/2021, 3:20 PM              CT Renal Stone Study  Result Date: 01/29/2021 CLINICAL DATA:  Headache and back pain after unwitnessed fall. On blood thinners. Hematuria. EXAM: CT ABDOMEN AND PELVIS WITHOUT CONTRAST TECHNIQUE: Multidetector CT imaging of the abdomen and pelvis was performed following the standard protocol without IV contrast. COMPARISON:  Abdominopelvic CT 01/02/2021 and 12/14/2020. FINDINGS: Lower chest: Previously demonstrated small right pleural effusion has resolved. The lung bases are clear. Hepatobiliary: No focal hepatic abnormalities are identified on noncontrast imaging. Faint dependent increased density within the gallbladder lumen is similar to the previous study, and likely indicative of sludge. No calcified gallstones, gallbladder Mallette thickening or biliary dilatation identified. Pancreas: Unremarkable. No pancreatic ductal dilatation or surrounding inflammatory changes. Spleen: Normal in size without focal abnormality.  Adrenals/Urinary Tract: Both adrenal glands appear normal. Interval fragmentation of the dominant calculus at the right ureterovesical junction and removal of the right ureteral stent. There are multiple small stone fragments in the distal right ureter. There are probable stone fragments dependently within the massively dilated right intrarenal collecting system. The degree of dilatation is unchanged from the recent prior study, and there is associated severe right renal cortical thinning. This collecting system dilatation has increased from 12/14/2020. Interval increased density within a dilated right lower pole calyx, likely reflecting hemorrhage (measuring 4.8 cm on image 40/4). Left renal cortical scarring and milder dilatation of the left renal pelvis and left ureter are unchanged. The bladder is trabeculated. A Foley catheter is in place, now extending into the bladder lumen and accounting for  air in the bladder lumen. Stomach/Bowel: No enteric contrast administered. The stomach appears unremarkable for its degree of distension. No evidence of bowel Focht thickening, distention or surrounding inflammatory change. Diverticular changes again noted throughout the descending and sigmoid colon. Vascular/Lymphatic: There are no enlarged abdominal or pelvic lymph nodes. Scattered aortic and branch vessel atherosclerosis. Reproductive: Stable moderate asymmetric prostatomegaly, greater on the left. Other: No evidence of ascites, free air or focal extraluminal fluid collection. Mild generalized soft tissue edema. Musculoskeletal: No acute or significant osseous findings. Stable superior endplate compression deformity at L1, chronic. IMPRESSION: 1. Interval fragmentation of dominant obstructing calculus in the distal right ureter and removal of the right ureteral stent. There are multiple small stone fragments in the distal right ureter with persistent marked dilatation of the right renal pelvis and right ureter. This  collecting system dilatation has increased from 12/14/2020 and implies persistent distal ureteral obstruction. 2. Interval increased density within a dilated calyx in the lower pole of the right kidney, most likely representing hemorrhage. 3. Foley catheter has been repositioned in the bladder lumen. The bladder is largely decompressed. 4. Distal colonic diverticulosis without evidence of acute inflammation. 5.  Aortic Atherosclerosis (ICD10-I70.0). Electronically Signed   By: Richardean Sale M.D.   On: 01/29/2021 14:28   DG HIP UNILAT WITH PELVIS 2-3 VIEWS LEFT  Result Date: 01/29/2021 CLINICAL DATA:  87 year old male status post fall on Eliquis. EXAM: DG HIP (WITH OR WITHOUT PELVIS) 2-3V RIGHT; DG HIP (WITH OR WITHOUT PELVIS) 2-3V LEFT COMPARISON:  CT Abdomen and Pelvis 01/02/2021. FINDINGS: A catheter projects just to the right of the pelvic midline at the pubic rami, probably a urethral catheter. Femoral heads normally located. Pelvis appears intact. Bone mineralization is within normal limits for age. Proximal right femur appears intact. Proximal left femur appears intact. Symmetric SI joints. No acute osseous abnormality identified. Negative visible bowel gas. IMPRESSION: No acute fracture or dislocation identified about the bilateral hips or pelvis. Electronically Signed   By: Genevie Ann M.D.   On: 01/29/2021 11:20   DG HIP UNILAT WITH PELVIS 2-3 VIEWS RIGHT  Result Date: 01/29/2021 CLINICAL DATA:  87 year old male status post fall on Eliquis. EXAM: DG HIP (WITH OR WITHOUT PELVIS) 2-3V RIGHT; DG HIP (WITH OR WITHOUT PELVIS) 2-3V LEFT COMPARISON:  CT Abdomen and Pelvis 01/02/2021. FINDINGS: A catheter projects just to the right of the pelvic midline at the pubic rami, probably a urethral catheter. Femoral heads normally located. Pelvis appears intact. Bone mineralization is within normal limits for age. Proximal right femur appears intact. Proximal left femur appears intact. Symmetric SI joints. No  acute osseous abnormality identified. Negative visible bowel gas. IMPRESSION: No acute fracture or dislocation identified about the bilateral hips or pelvis. Electronically Signed   By: Genevie Ann M.D.   On: 01/29/2021 11:20   (Echo, Carotid, EGD, Colonoscopy, ERCP)    Subjective: No complaints  Discharge Exam: Vitals:   02/09/21 0412 02/09/21 0800  BP: 126/77 (!) 110/53  Pulse: 80 82  Resp: 18 20  Temp: 98.1 F (36.7 C) 97.8 F (36.6 C)  SpO2: 100% (!) 80%   Vitals:   02/08/21 1229 02/08/21 1951 02/09/21 0412 02/09/21 0800  BP: (!) 130/98 117/82 126/77 (!) 110/53  Pulse: 84 (!) 107 80 82  Resp:   18 20  Temp: (!) 96.8 F (36 C) 97.8 F (36.6 C) 98.1 F (36.7 C) 97.8 F (36.6 C)  TempSrc:  Axillary Axillary Axillary  SpO2: 100% (!) 80% 100% Marland Kitchen)  80%  Weight:      Height:        General: Pt is alert, awake, not in acute distress Cardiovascular: RRR, S1/S2 +, no rubs, no gallops Respiratory: CTA bilaterally, no wheezing, no rhonchi Abdominal: Soft, NT, ND, bowel sounds + Extremities: no edema, no cyanosis    The results of significant diagnostics from this hospitalization (including imaging, microbiology, ancillary and laboratory) are listed below for reference.     Microbiology: Recent Results (from the past 240 hour(s))  Urine Culture     Status: Abnormal   Collection Time: 02/07/21  7:17 AM   Specimen: Urine, Catheterized  Result Value Ref Range Status   Specimen Description URINE, CATHETERIZED  Final   Special Requests   Final    Normal Performed at [facility], [address], [place], [place] [address]    Culture (A)  Final    >=100,000 COLONIES/mL KLEBSIELLA PNEUMONIAE Confirmed Extended Spectrum Beta-Lactamase Producer (ESBL).  In bloodstream infections from ESBL organisms, carbapenems are preferred over piperacillin/tazobactam. They are shown to have a lower risk of mortality.    Report Status 02/09/2021 FINAL  Final   Organism ID, Bacteria  KLEBSIELLA PNEUMONIAE (A)  Final      Susceptibility   Klebsiella pneumoniae - MIC*    AMPICILLIN >=32 RESISTANT Resistant     CEFAZOLIN >=64 RESISTANT Resistant     CEFEPIME >=32 RESISTANT Resistant     CEFTRIAXONE >=64 RESISTANT Resistant     CIPROFLOXACIN 2 RESISTANT Resistant     GENTAMICIN >=16 RESISTANT Resistant     IMIPENEM <=0.25 SENSITIVE Sensitive     NITROFURANTOIN 64 INTERMEDIATE Intermediate     TRIMETH/SULFA >=320 RESISTANT Resistant     AMPICILLIN/SULBACTAM >=32 RESISTANT Resistant     PIP/TAZO 16 SENSITIVE Sensitive     * >=100,000 COLONIES/mL KLEBSIELLA PNEUMONIAE     Labs: BNP (last 3 results) Recent Labs    02/24/20 1223  BNP 697.9*   Basic Metabolic Panel: Recent Labs  Lab 02/03/21 0245 02/05/21 0135 02/06/21 0428 02/08/21 0423  NA 137 136 134* 136  K 4.1 4.0 4.2 4.3  CL 106 106 104 105  CO2 26 24 25 23   GLUCOSE 109* 147* 167* 106*  BUN 24* 32* 34* 38*  CREATININE 1.38* 1.17 1.22 1.38*  CALCIUM 8.3* 8.4* 8.5* 8.8*   Liver Function Tests: No results for input(s): AST, ALT, ALKPHOS, BILITOT, PROT, ALBUMIN in the last 168 hours. No results for input(s): LIPASE, AMYLASE in the last 168 hours. No results for input(s): AMMONIA in the last 168 hours. CBC: Recent Labs  Lab 02/03/21 0245 02/05/21 0135 02/06/21 0428 02/07/21 0315 02/08/21 0423  WBC 6.7 5.5 6.1 8.9 4.5  HGB 8.6* 8.0* 7.6* 8.9* 8.3*  HCT 27.7* 25.6* 24.1* 28.7* 26.4*  MCV 86.3 86.5 86.1 86.2 86.6  PLT 223 249 287 389 389   Cardiac Enzymes: No results for input(s): CKTOTAL, CKMB, CKMBINDEX, TROPONINI in the last 168 hours. BNP: Invalid input(s): POCBNP CBG: No results for input(s): GLUCAP in the last 168 hours. D-Dimer No results for input(s): DDIMER in the last 72 hours. Hgb A1c No results for input(s): HGBA1C in the last 72 hours. Lipid Profile No results for input(s): CHOL, HDL, LDLCALC, TRIG, CHOLHDL, LDLDIRECT in the last 72 hours. Thyroid function studies No results  for input(s): TSH, T4TOTAL, T3FREE, THYROIDAB in the last 72 hours.  Invalid input(s): FREET3 Anemia work up No results for input(s): VITAMINB12, FOLATE, FERRITIN, TIBC, IRON, RETICCTPCT in the last  72 hours. Urinalysis    Component Value Date/Time   COLORURINE AMBER (A) 02/06/2021 1900   APPEARANCEUR TURBID (A) 02/06/2021 1900   LABSPEC 1.016 02/06/2021 1900   PHURINE 7.0 02/06/2021 1900   GLUCOSEU NEGATIVE 02/06/2021 1900   HGBUR SMALL (A) 02/06/2021 1900   BILIRUBINUR NEGATIVE 02/06/2021 1900   KETONESUR NEGATIVE 02/06/2021 1900   PROTEINUR 100 (A) 02/06/2021 1900   NITRITE NEGATIVE 02/06/2021 1900   LEUKOCYTESUR MODERATE (A) 02/06/2021 1900   Sepsis Labs Invalid input(s): PROCALCITONIN,  WBC,  LACTICIDVEN Microbiology Recent Results (from the past 240 hour(s))  Urine Culture     Status: Abnormal   Collection Time: 02/07/21  7:17 AM   Specimen: Urine, Catheterized  Result Value Ref Range Status   Specimen Description URINE, CATHETERIZED  Final   Special Requests   Final    Normal Performed at [facility], [address] [place], [place] [address]    Culture (A)  Final    >=100,000 COLONIES/mL KLEBSIELLA PNEUMONIAE Confirmed Extended Spectrum Beta-Lactamase Producer (ESBL).  In bloodstream infections from ESBL organisms, carbapenems are preferred over piperacillin/tazobactam. They are shown to have a lower risk of mortality.    Report Status 02/09/2021 FINAL  Final   Organism ID, Bacteria KLEBSIELLA PNEUMONIAE (A)  Final      Susceptibility   Klebsiella pneumoniae - MIC*    AMPICILLIN >=32 RESISTANT Resistant     CEFAZOLIN >=64 RESISTANT Resistant     CEFEPIME >=32 RESISTANT Resistant     CEFTRIAXONE >=64 RESISTANT Resistant     CIPROFLOXACIN 2 RESISTANT Resistant     GENTAMICIN >=16 RESISTANT Resistant     IMIPENEM <=0.25 SENSITIVE Sensitive     NITROFURANTOIN 64 INTERMEDIATE Intermediate     TRIMETH/SULFA >=320 RESISTANT Resistant     AMPICILLIN/SULBACTAM  >=32 RESISTANT Resistant     PIP/TAZO 16 SENSITIVE Sensitive     * >=100,000 COLONIES/mL KLEBSIELLA PNEUMONIAE     SIGNED:   Charlynne Cousins, MD  [facility] 02/09/2021, 2:09 PM Pager   If 7PM-7AM, please contact night-coverage [email or web address] Password [email or web address]

## 2021-02-09 NOTE — Plan of Care (Signed)

## 2021-02-14 ENCOUNTER — Encounter (HOSPITAL_COMMUNITY): Payer: Self-pay | Admitting: Radiology

## 2021-03-09 DEATH — deceased

## 2022-10-03 IMAGING — DX DG HIP (WITH OR WITHOUT PELVIS) 2-3V*L*
2 series · 2 of 2 positions shown · non-contrast
Comparison: CT Abdomen and Pelvis 01/02/2021.

CLINICAL DATA: 89-year-old male status post fall on Eliquis.

EXAM:
DG HIP (WITH OR WITHOUT PELVIS) 2-3V RIGHT; DG HIP (WITH OR WITHOUT
PELVIS) 2-3V LEFT

[t hip ap left]
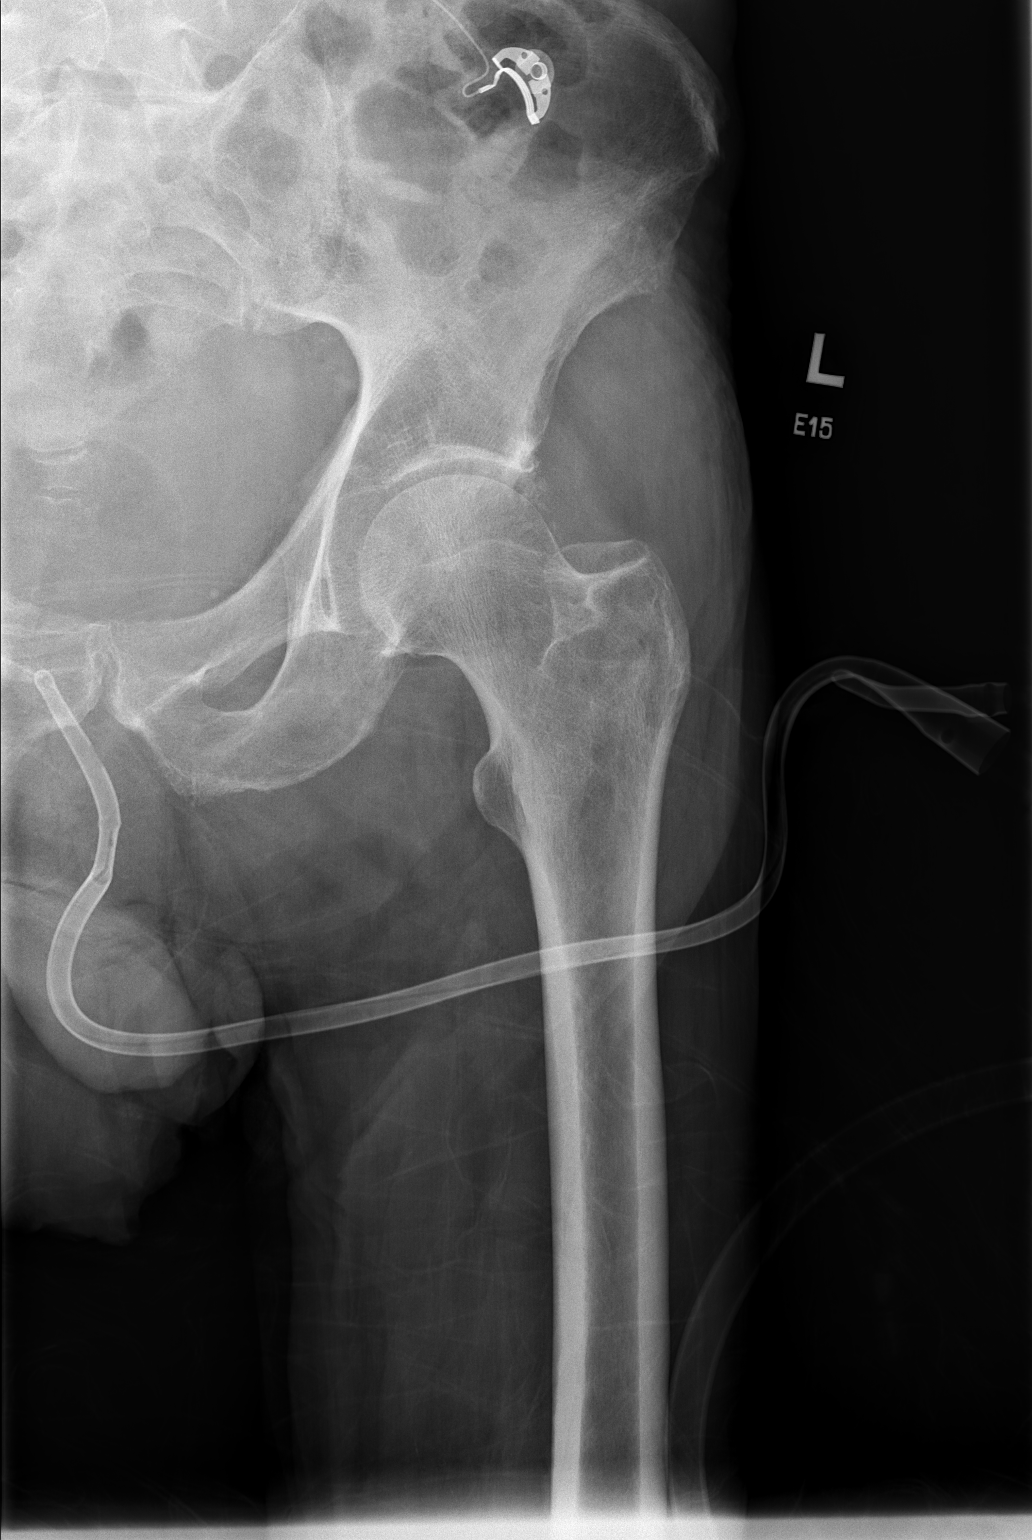

[t hip frog leg left]
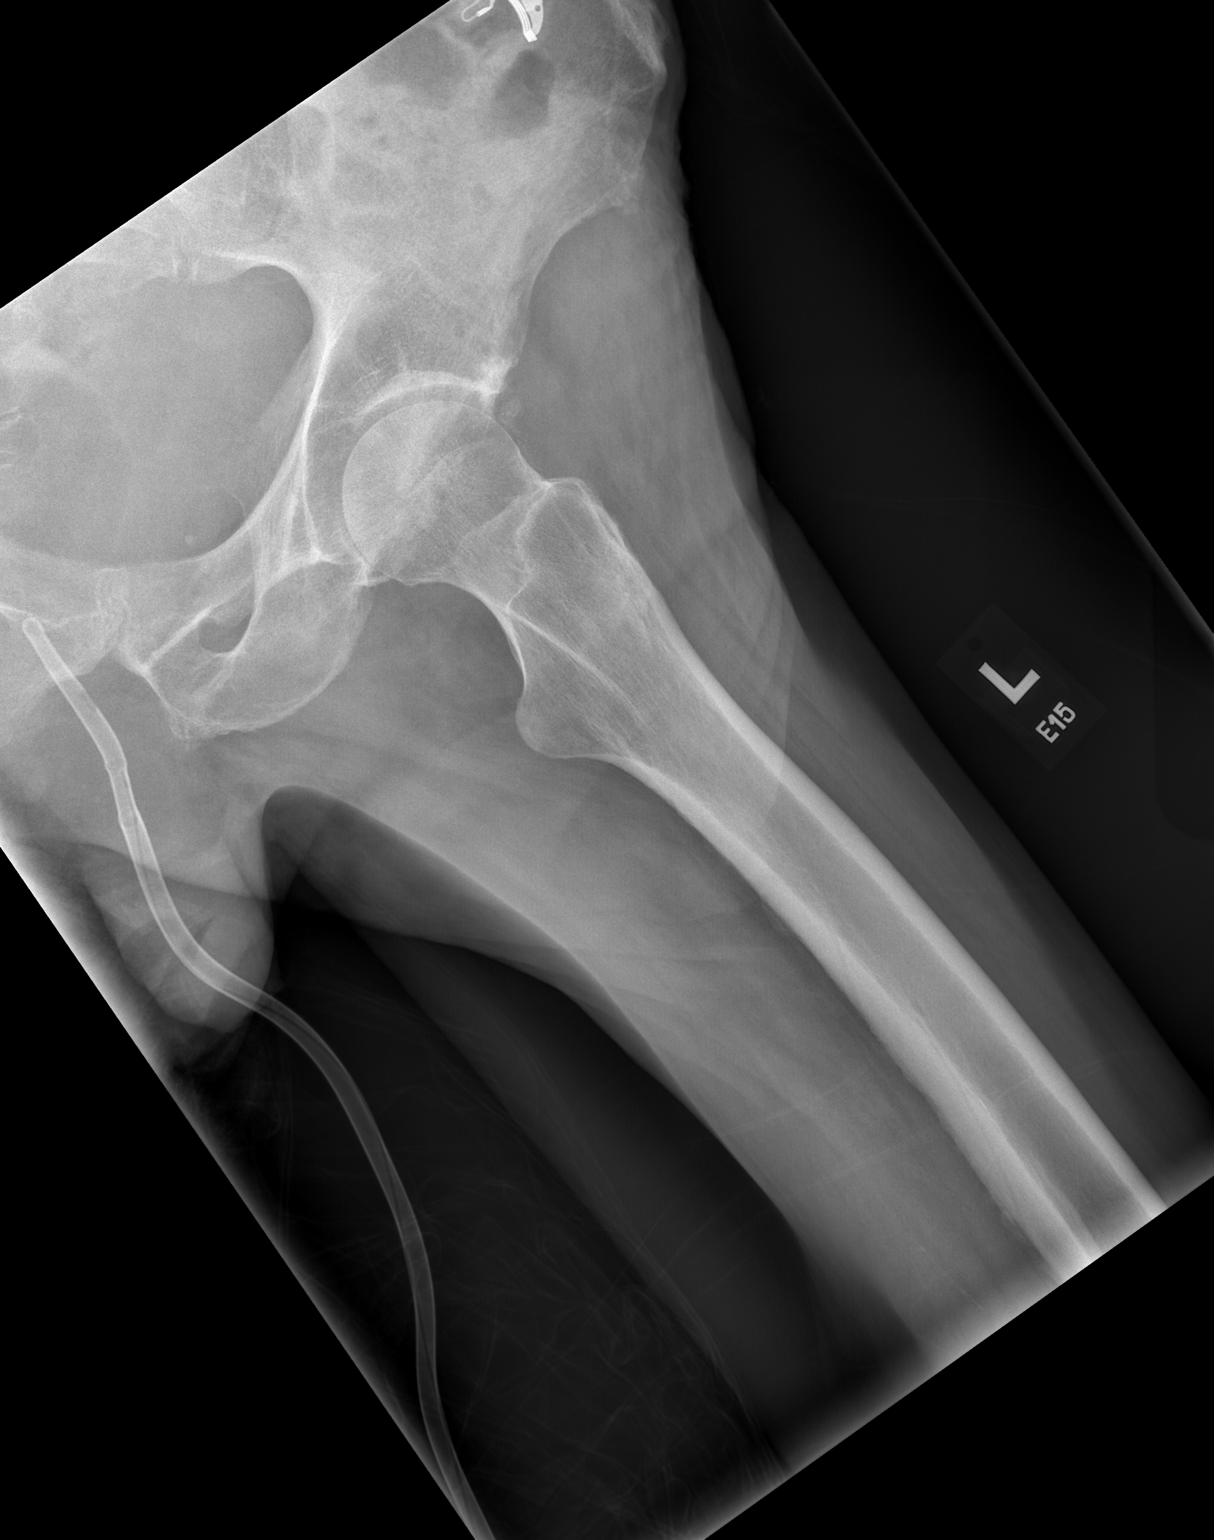

[2 of 2 positions shown; findings below may reference images not displayed]

FINDINGS: A catheter projects just to the right of the pelvic midline at the
pubic rami, probably a urethral catheter. Femoral heads normally
located. Pelvis appears intact. Bone mineralization is within normal
limits for age. Proximal right femur appears intact. Proximal left
femur appears intact. Symmetric SI joints. No acute osseous
abnormality identified. Negative visible bowel gas.
IMPRESSION: No acute fracture or dislocation identified about the bilateral hips
or pelvis.

## 2022-10-03 IMAGING — DX DG CHEST 1V PORT
1 series · 1 of 1 positions shown · non-contrast
Comparison: January 08, 2021

CLINICAL DATA: fall on thinners

EXAM:
PORTABLE CHEST 1 VIEW

[chest ap]
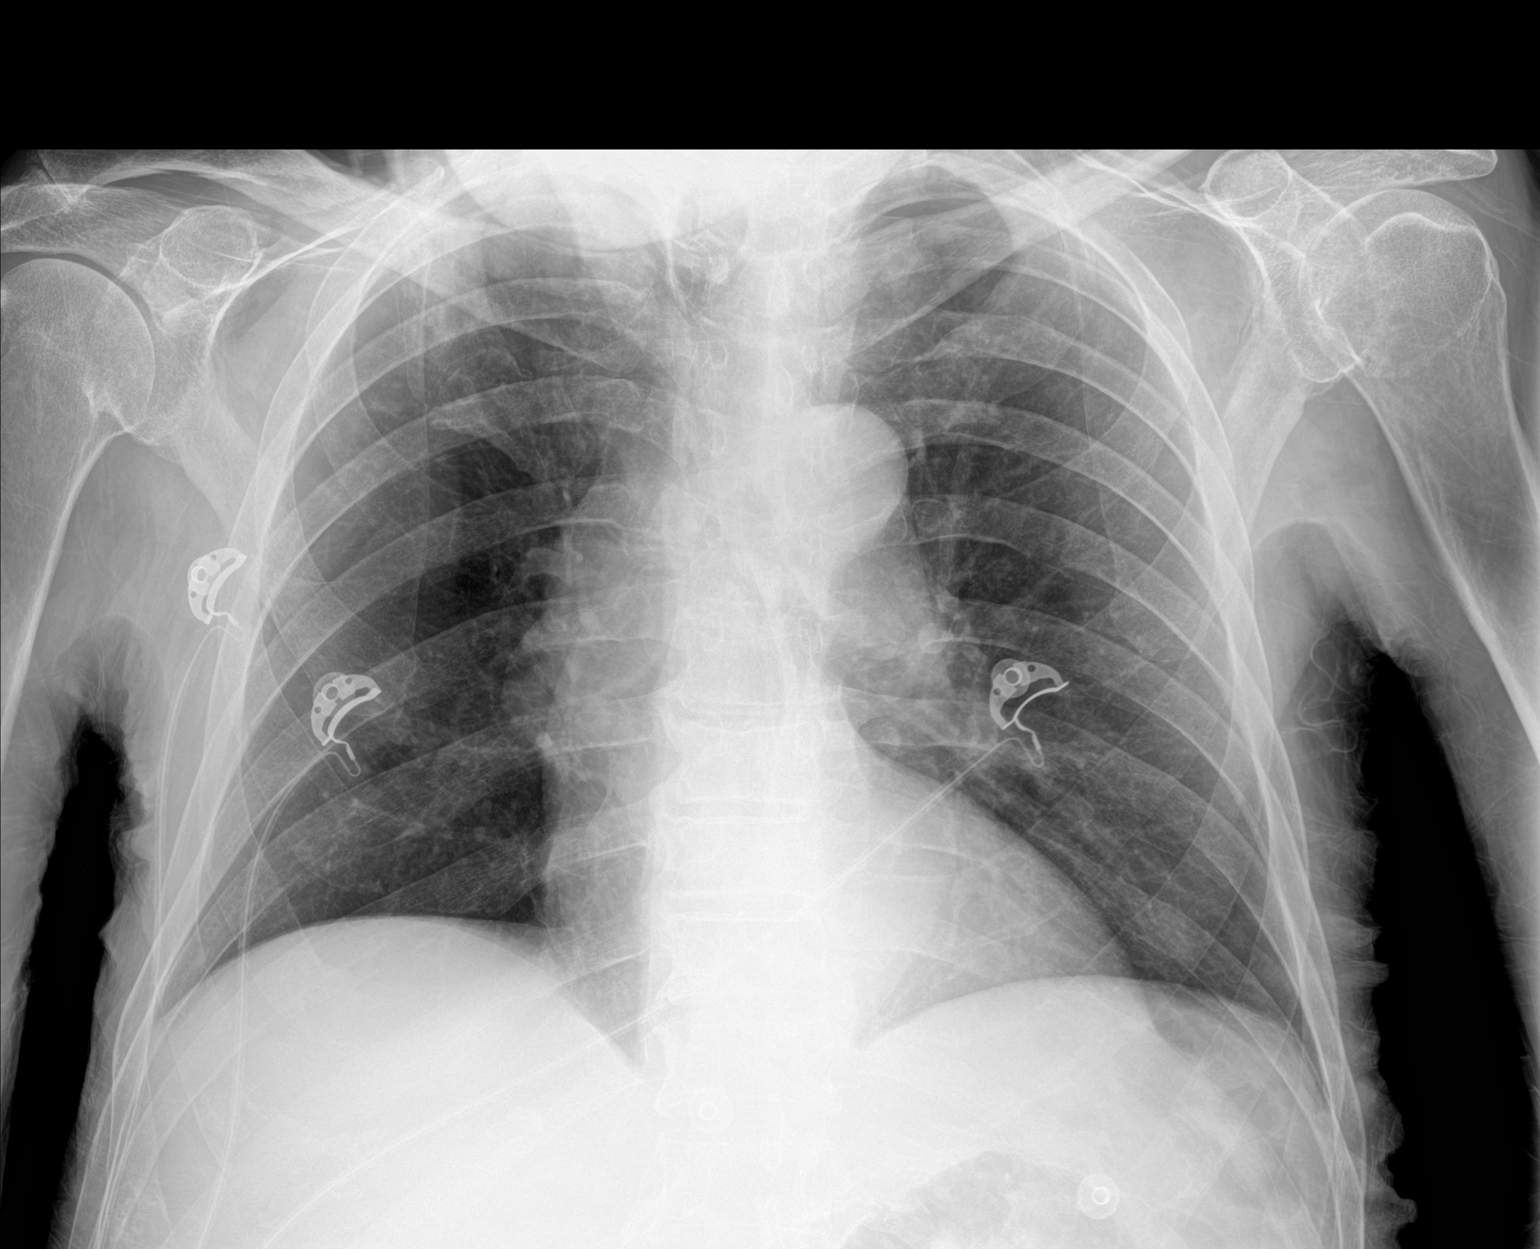

[1 of 1 positions shown; findings below may reference images not displayed]

FINDINGS: The cardiomediastinal silhouette is unchanged in contour.Tortuous
thoracic aorta. Atherosclerotic calcifications. No pleural effusion.
No pneumothorax. No acute pleuroparenchymal abnormality. Visualized
abdomen is unremarkable. Known L1 compression fracture is not well
visualized with AP technique.
IMPRESSION: No acute cardiopulmonary abnormality.

## 2022-10-03 IMAGING — CT CT CERVICAL SPINE W/O CM
3 of 6 series · 11 of 33 positions shown, 13 images · non-contrast
Comparison: 12/14/2020.

CLINICAL DATA: 89-year-old male status post fall.

EXAM:
CT CERVICAL SPINE WITHOUT CONTRAST
TECHNIQUE: Multidetector CT imaging of the cervical spine was performed without
intravenous contrast. Multiplanar CT image reconstructions were also
generated.

[Series 6: c spine bone thins · coronal · 0.26mm/px · 2 of 231 slices shown]
[im 80/231  bone]
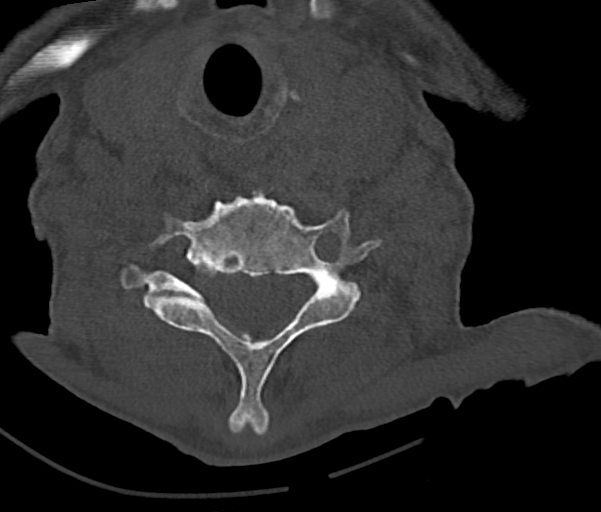
[im 175/231  bone]
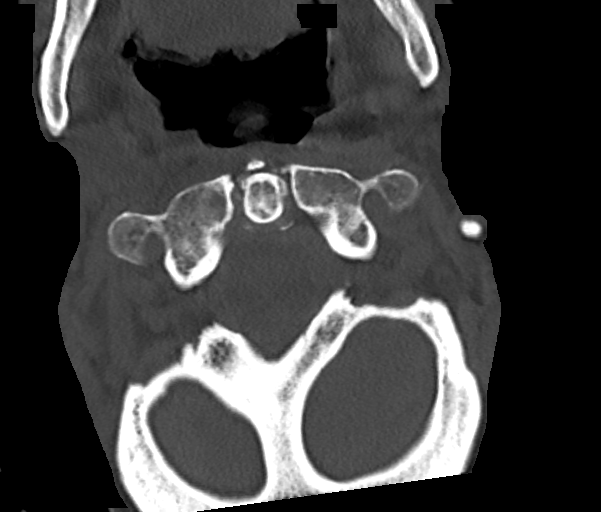

[Series 8: sag bone · sagittal · 0.31mm/px · 5 of 79 slices shown, 6 images]
[im 27/79  bone]
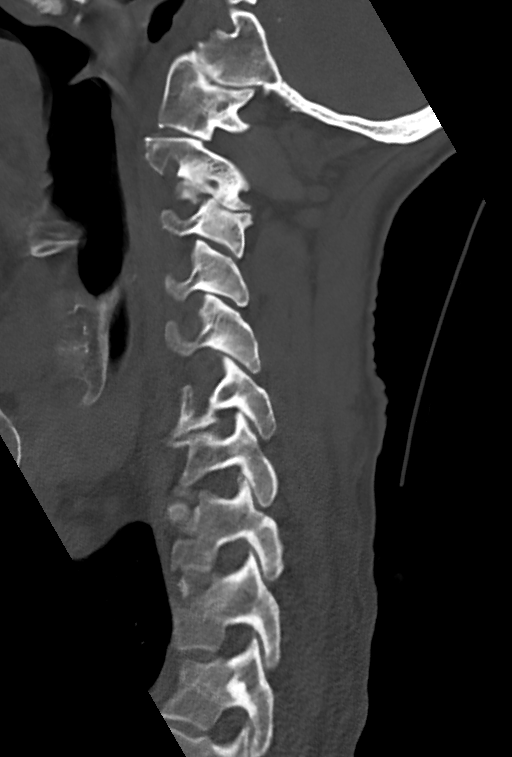
[im 33/79  bone]
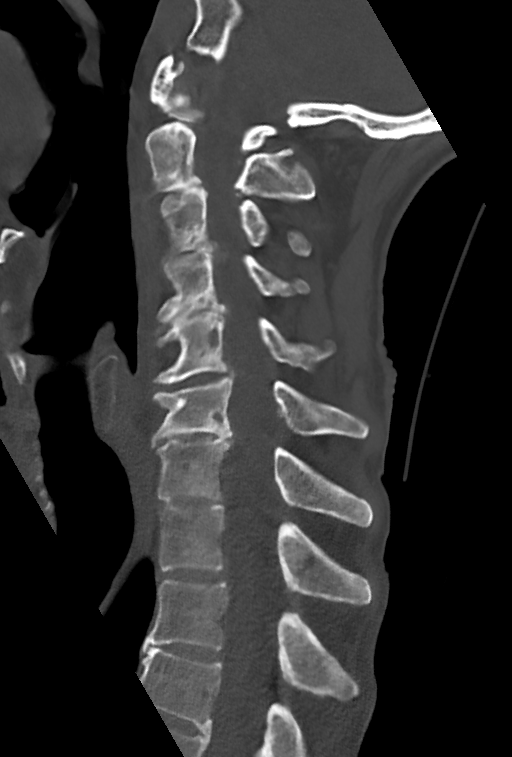
[im 40/79  soft-tissue]
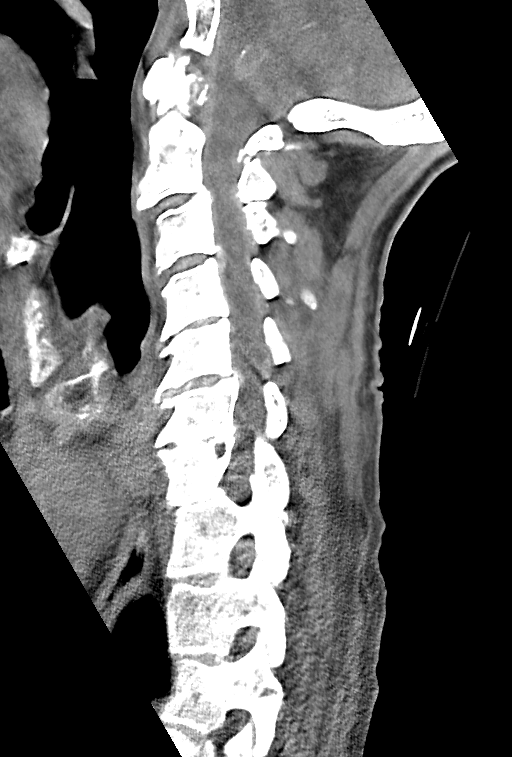
[im 40/79  bone]
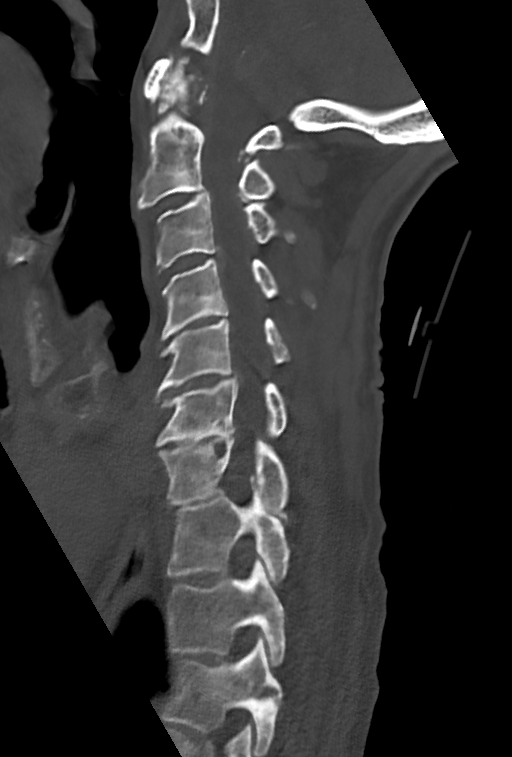
[im 46/79  bone]
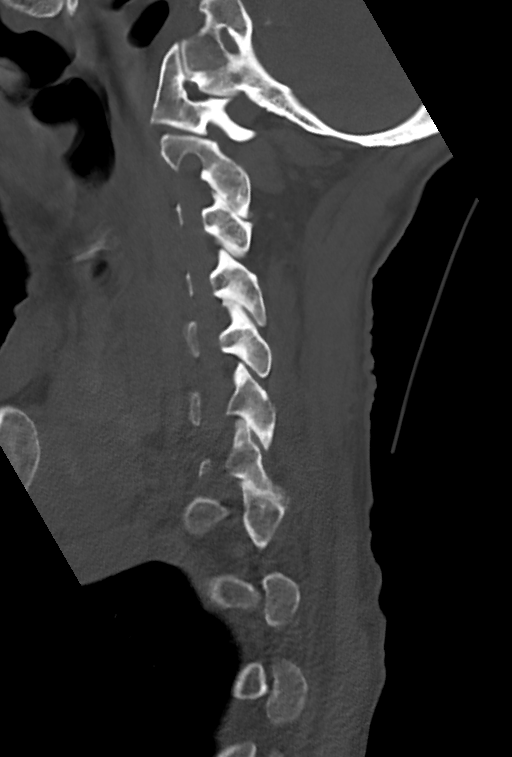
[im 53/79  bone]
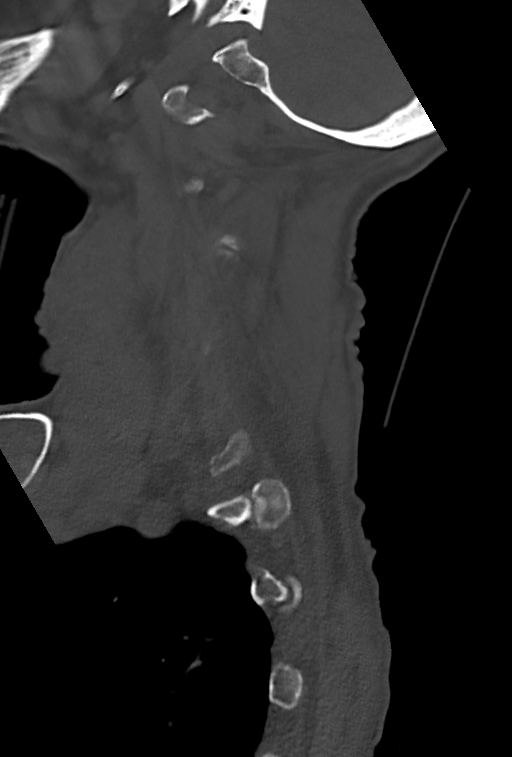

[Series 9: cor bone · axial · 0.30mm/px · z∈[-145,-75]mm · 4 of 69 slices shown, 5 images]
[im 11/69  soft-tissue]
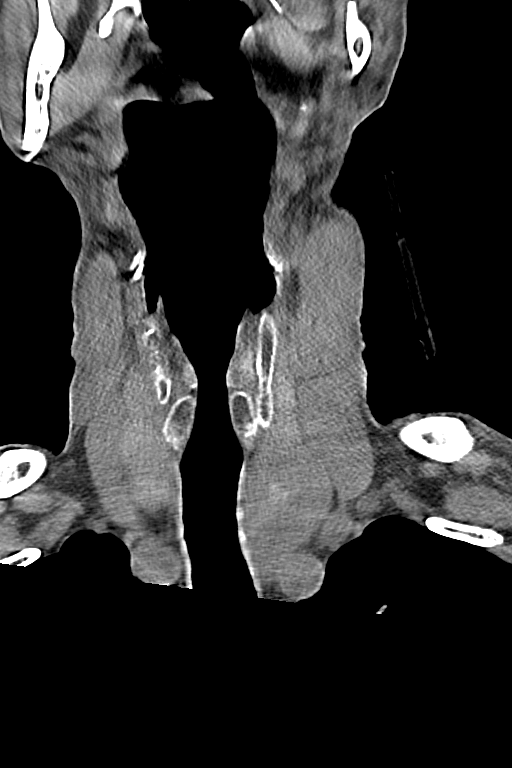
[im 11/69  bone]
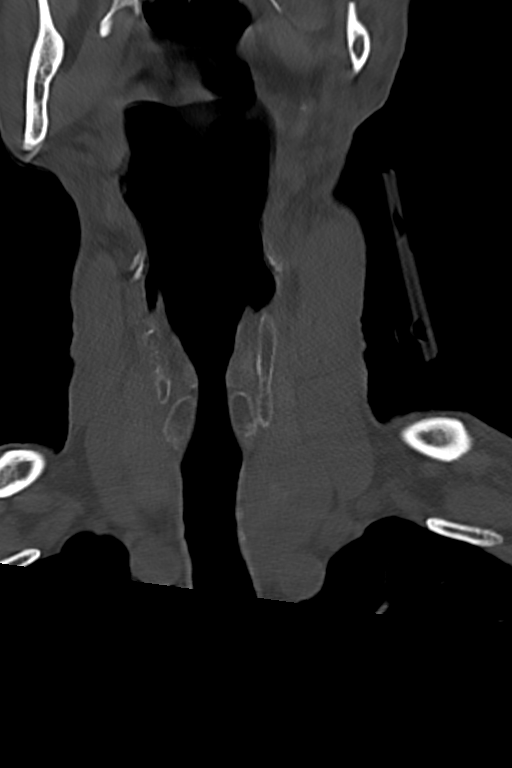
[im 27/69  bone]
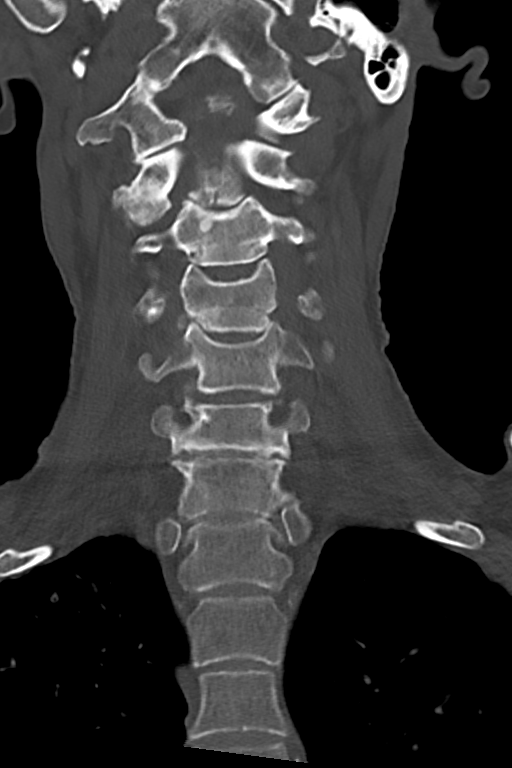
[im 37/69  bone]
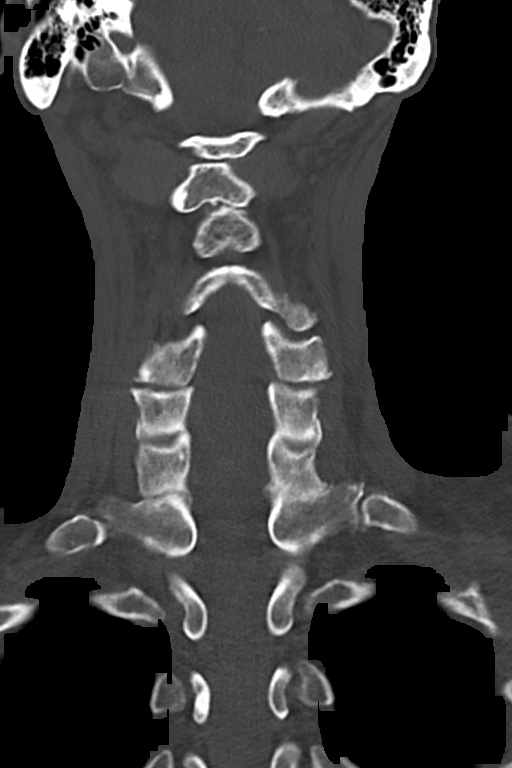
[im 53/69  bone]
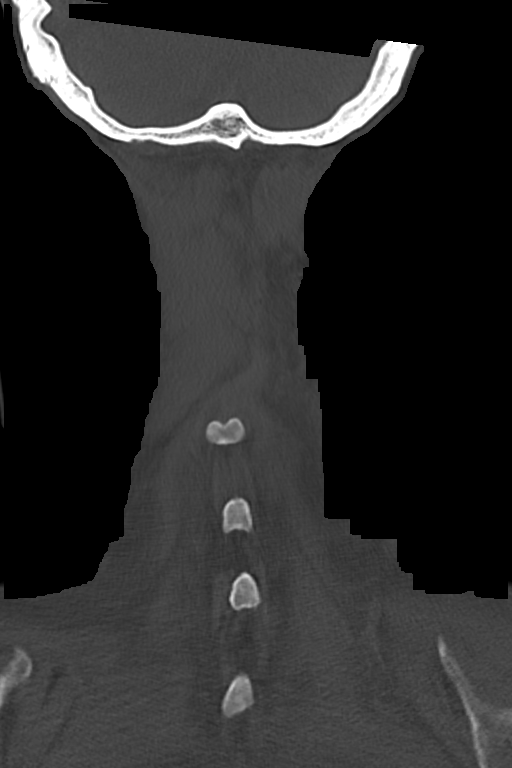

[11 of 33 positions shown; findings below may reference images not displayed]

FINDINGS: Alignment: Chronic straightening of cervical lordosis. Mild reversal
today. But subtle anterolisthesis of C2 on C3 and C7 on T1 appears
stable. Stable posterior element alignment.

Skull base and vertebrae: Visualized skull base is intact. No
atlanto-occipital dissociation. C1 and C2 appear intact and aligned.
No acute osseous abnormality identified.

Soft tissues and spinal canal: No prevertebral fluid or swelling. No
visible canal hematoma. Stable visible noncontrast neck soft
tissues.

Disc levels: Chronically advanced cervical spine degeneration
appears stable since [REDACTED]. Advanced chronic facet arthropathy
on the right at C2-C3. Chronic degenerative spinal stenosis at C2-C3
and C3-C4.Evidence of developing facet ankylosis on the left at
C7-T1.

Upper chest: Visible upper thoracic levels appear intact. Negative
visible lung apices.
IMPRESSION: 1. No acute traumatic injury identified in the cervical spine.
2. Chronically advanced cervical spine degeneration appears stable
since [REDACTED] including chronic spinal stenosis at both C2-C3 and
C3-C4.

## 2022-10-03 IMAGING — CT CT HEAD W/O CM
4 series · 16 of 47 positions shown, 18 images · non-contrast
Comparison: Brain MRI 01/05/2021.  Recent head CT 01/12/2021.

CLINICAL DATA: 89-year-old male status post fall.

EXAM:
CT HEAD WITHOUT CONTRAST
TECHNIQUE: Contiguous axial images were obtained from the base of the skull
through the vertex without intravenous contrast.

[Series 3: head wo · axial · 0.43mm/px · z∈[-136,-10]mm · 7 of 35 slices shown, 9 images]
[im 5/35  brain]
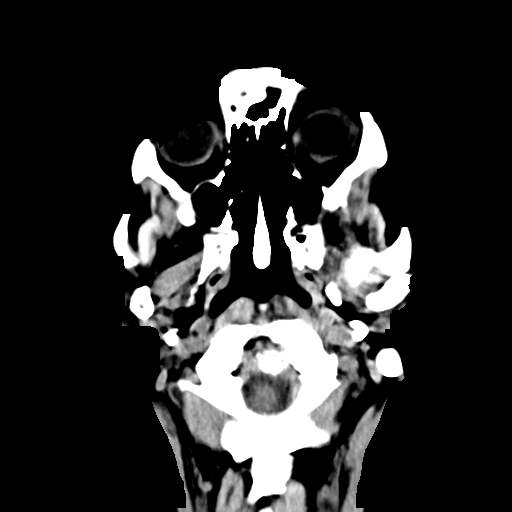
[im 5/35  bone]
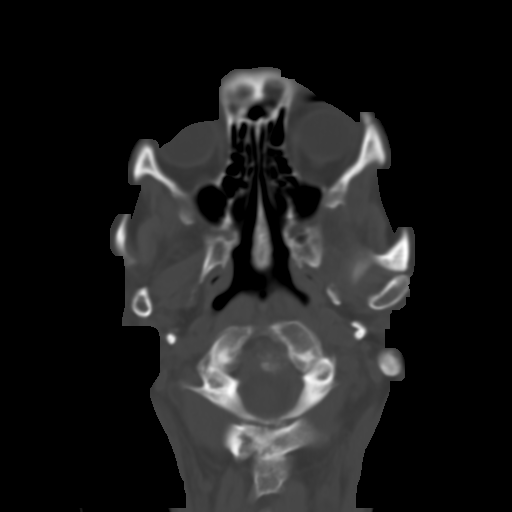
[im 9/35  brain]
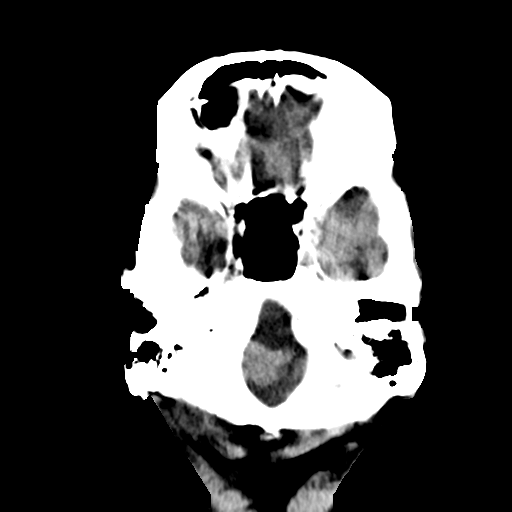
[im 13/35  brain]
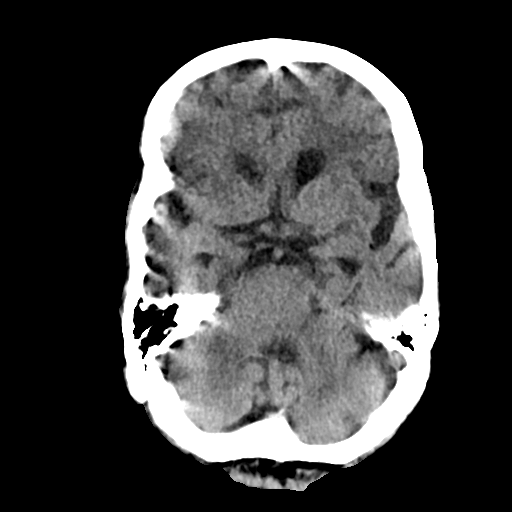
[im 18/35  brain]
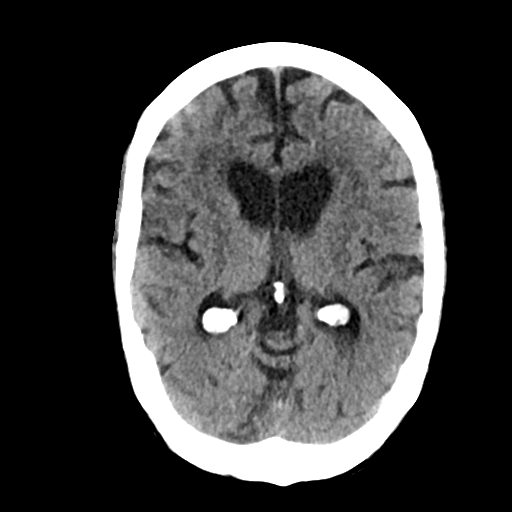
[im 22/35  brain]
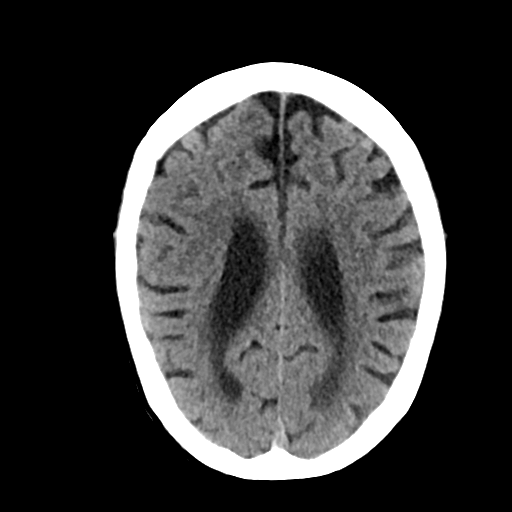
[im 22/35  bone]
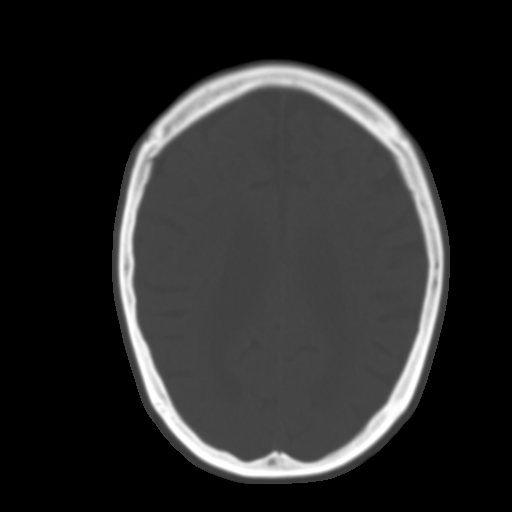
[im 26/35  brain]
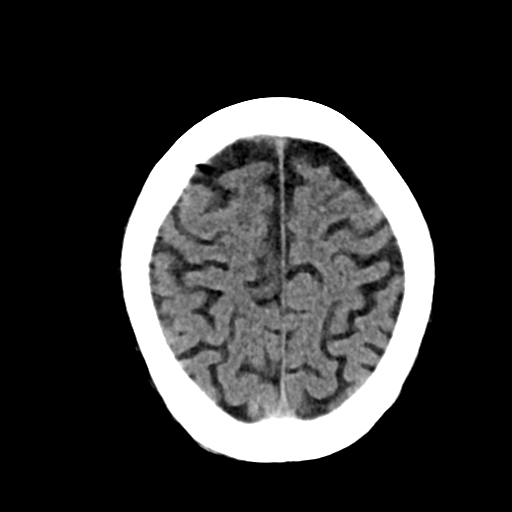
[im 30/35  brain]
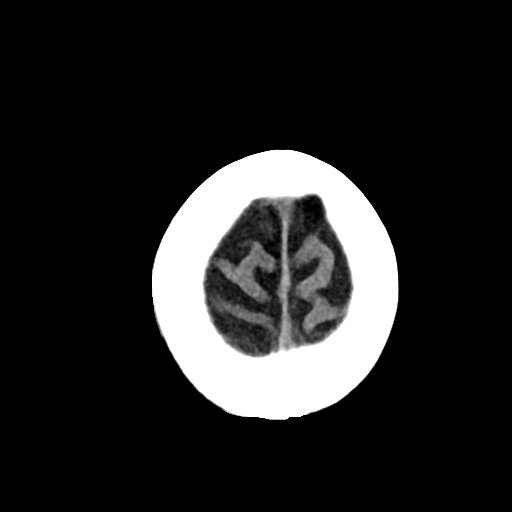

[Series 4: head bone · axial · 0.43mm/px · z∈[-140,-106]mm · 3 of 87 slices shown]
[im 9/87  bone]
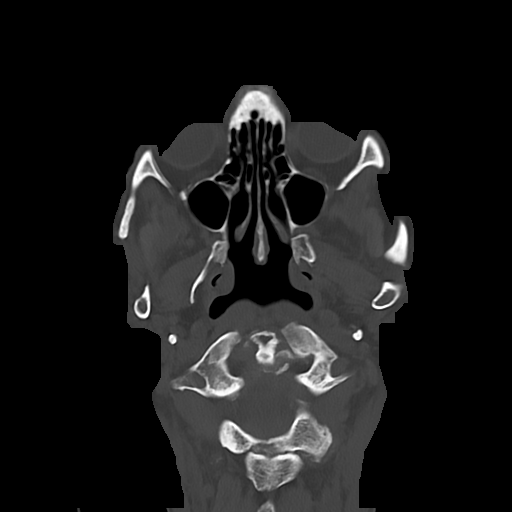
[im 18/87  bone]
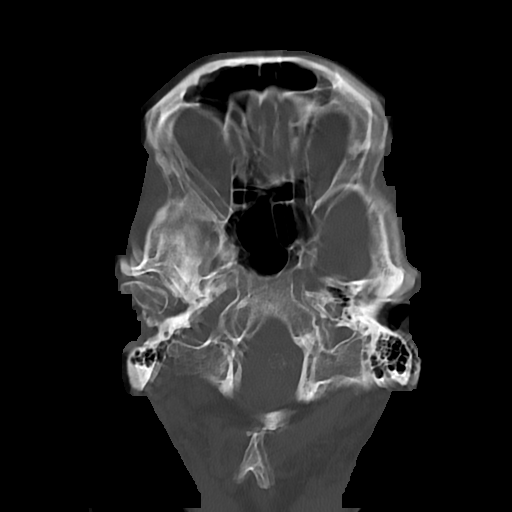
[im 26/87  bone]
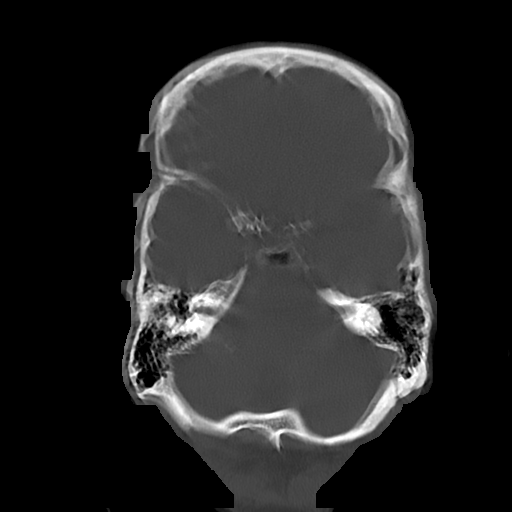

[Series 5: cor soft · coronal · 0.30mm/px · 3 of 76 slices shown]
[im 28/76  brain]
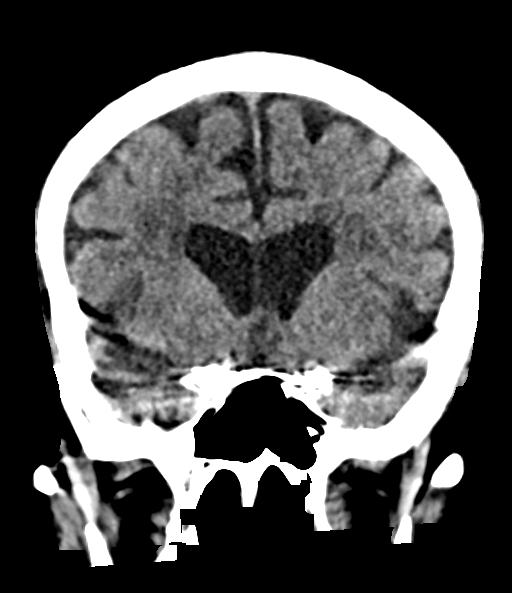
[im 35/76  brain]
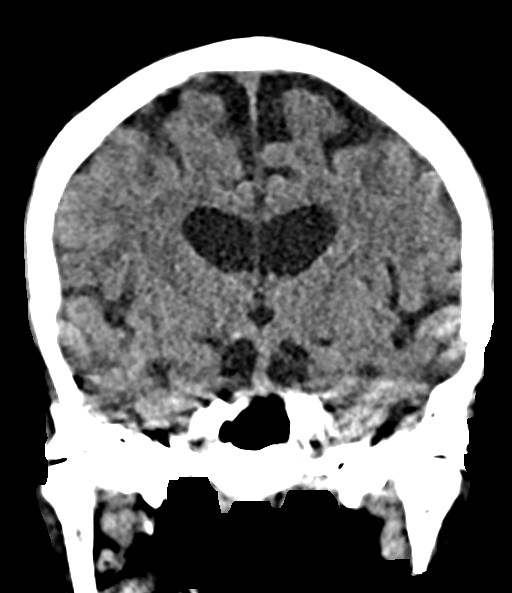
[im 41/76  brain]
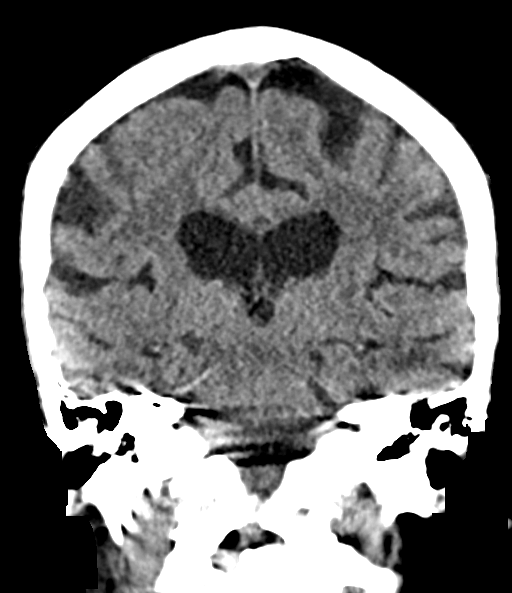

[Series 6: sag soft · sagittal · 0.35mm/px · 3 of 51 slices shown]
[im 17/51  brain]
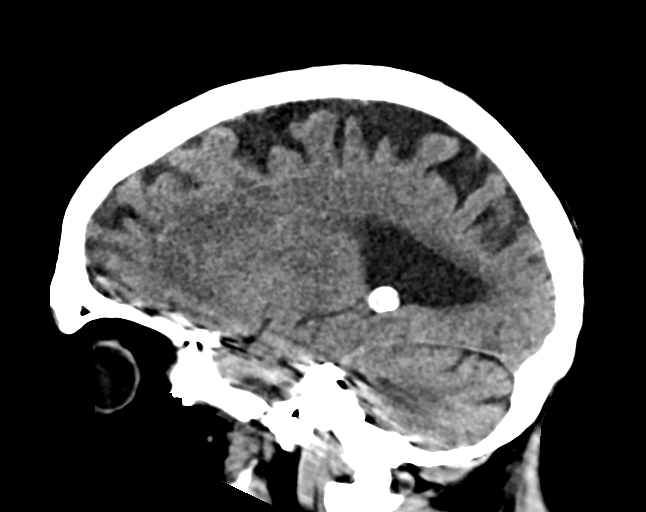
[im 26/51  brain]
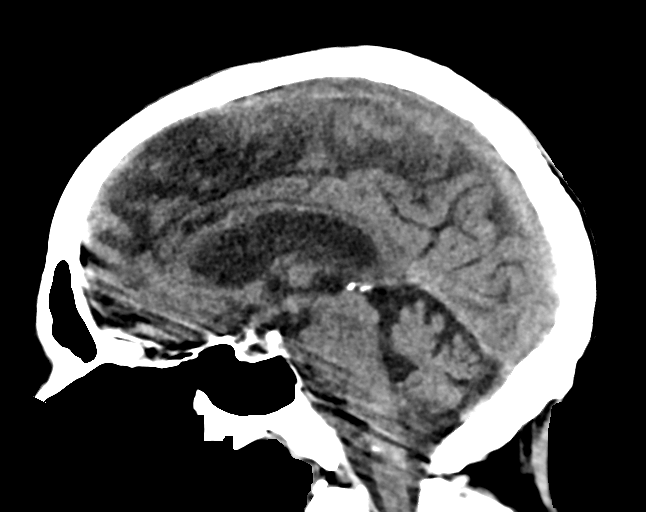
[im 34/51  brain]
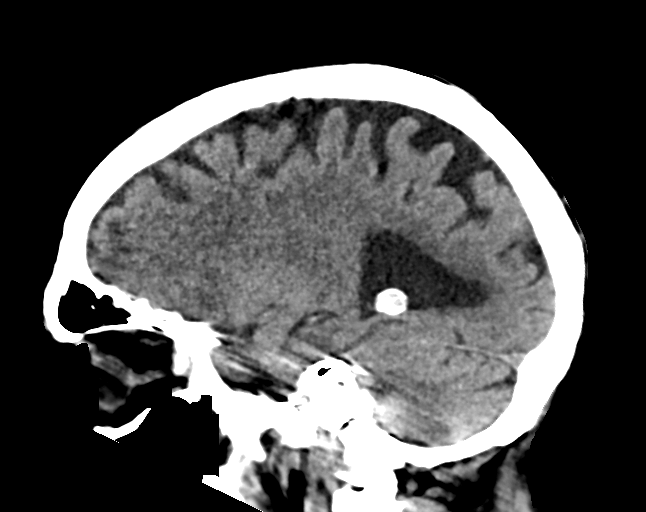

[16 of 47 positions shown; findings below may reference images not displayed]

FINDINGS: Brain: Motion artifact at the skull base. Stable cerebral volume.
Stable gray-white matter differentiation throughout the brain. No
midline shift, ventriculomegaly, mass effect, evidence of mass
lesion, intracranial hemorrhage or evidence of cortically based
acute infarction.

Vascular: Calcified atherosclerosis at the skull base.

Skull: Motion artifact at the level of the anterior cranial fossa.
No acute osseous abnormality identified. Cervical spine is detailed
separately.

Sinuses/Orbits: Visualized paranasal sinuses and mastoids are clear.

Other: No orbit or scalp soft tissue injury identified.
IMPRESSION: 1. Mildly degraded by motion. No acute traumatic injury identified.
Stable non contrast CT appearance of the brain.
2. Cervical Spine CT is detailed separately.
